# Patient Record
Sex: Female | Born: 1935 | Race: White | Hispanic: No | Marital: Married | Smoking: Never smoker
Health system: Southern US, Community
[De-identification: ages and names within clinical notes are randomized; demographics above are authoritative.]

## PROBLEM LIST (undated history)

## (undated) DIAGNOSIS — M810 Age-related osteoporosis without current pathological fracture: Secondary | ICD-10-CM

## (undated) DIAGNOSIS — I779 Disorder of arteries and arterioles, unspecified: Secondary | ICD-10-CM

## (undated) DIAGNOSIS — H269 Unspecified cataract: Secondary | ICD-10-CM

## (undated) DIAGNOSIS — R000 Tachycardia, unspecified: Secondary | ICD-10-CM

## (undated) DIAGNOSIS — I10 Essential (primary) hypertension: Secondary | ICD-10-CM

## (undated) DIAGNOSIS — I341 Nonrheumatic mitral (valve) prolapse: Secondary | ICD-10-CM

## (undated) DIAGNOSIS — T7840XA Allergy, unspecified, initial encounter: Secondary | ICD-10-CM

## (undated) DIAGNOSIS — M1990 Unspecified osteoarthritis, unspecified site: Secondary | ICD-10-CM

## (undated) DIAGNOSIS — I351 Nonrheumatic aortic (valve) insufficiency: Secondary | ICD-10-CM

## (undated) DIAGNOSIS — E785 Hyperlipidemia, unspecified: Secondary | ICD-10-CM

## (undated) DIAGNOSIS — I350 Nonrheumatic aortic (valve) stenosis: Secondary | ICD-10-CM

## (undated) DIAGNOSIS — R609 Edema, unspecified: Secondary | ICD-10-CM

## (undated) DIAGNOSIS — R7303 Prediabetes: Secondary | ICD-10-CM

## (undated) DIAGNOSIS — L0390 Cellulitis, unspecified: Secondary | ICD-10-CM

## (undated) DIAGNOSIS — I5189 Other ill-defined heart diseases: Secondary | ICD-10-CM

## (undated) DIAGNOSIS — E041 Nontoxic single thyroid nodule: Secondary | ICD-10-CM

## (undated) DIAGNOSIS — I739 Peripheral vascular disease, unspecified: Secondary | ICD-10-CM

## (undated) DIAGNOSIS — K219 Gastro-esophageal reflux disease without esophagitis: Secondary | ICD-10-CM

## (undated) HISTORY — PX: CATARACT EXTRACTION, BILATERAL: SHX1313

## (undated) HISTORY — PX: BIOPSY THYROID: PRO38

## (undated) HISTORY — DX: Nonrheumatic aortic (valve) insufficiency: I35.1

## (undated) HISTORY — PX: COLONOSCOPY: SHX174

## (undated) HISTORY — DX: Age-related osteoporosis without current pathological fracture: M81.0

## (undated) HISTORY — DX: Cellulitis, unspecified: L03.90

## (undated) HISTORY — DX: Disorder of arteries and arterioles, unspecified: I77.9

## (undated) HISTORY — DX: Unspecified cataract: H26.9

## (undated) HISTORY — DX: Peripheral vascular disease, unspecified: I73.9

## (undated) HISTORY — DX: Essential (primary) hypertension: I10

## (undated) HISTORY — DX: Hyperlipidemia, unspecified: E78.5

## (undated) HISTORY — PX: NASAL SINUS SURGERY: SHX719

## (undated) HISTORY — DX: Edema, unspecified: R60.9

## (undated) HISTORY — DX: Other ill-defined heart diseases: I51.89

## (undated) HISTORY — DX: Allergy, unspecified, initial encounter: T78.40XA

## (undated) HISTORY — DX: Gastro-esophageal reflux disease without esophagitis: K21.9

## (undated) HISTORY — DX: Nontoxic single thyroid nodule: E04.1

## (undated) HISTORY — DX: Prediabetes: R73.03

## (undated) HISTORY — DX: Tachycardia, unspecified: R00.0

## (undated) HISTORY — DX: Unspecified osteoarthritis, unspecified site: M19.90

## (undated) HISTORY — PX: TONSILLECTOMY: SUR1361

## (undated) HISTORY — DX: Nonrheumatic mitral (valve) prolapse: I34.1

---
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
## 1962-08-14 HISTORY — PX: ABDOMINAL HYSTERECTOMY: SHX81

## 2002-04-14 HISTORY — PX: CARDIAC CATHETERIZATION: SHX172

## 2002-05-01 ENCOUNTER — Inpatient Hospital Stay (HOSPITAL_COMMUNITY): Admission: EM | Admit: 2002-05-01 | Discharge: 2002-05-02 | Payer: Self-pay | Admitting: Emergency Medicine

## 2002-05-01 ENCOUNTER — Encounter: Payer: Self-pay | Admitting: Cardiology

## 2005-03-06 ENCOUNTER — Encounter: Admission: RE | Admit: 2005-03-06 | Discharge: 2005-06-04 | Payer: Self-pay | Admitting: Anesthesiology

## 2008-07-01 ENCOUNTER — Ambulatory Visit: Payer: Self-pay | Admitting: Cardiology

## 2008-07-16 ENCOUNTER — Ambulatory Visit: Payer: Self-pay

## 2008-07-16 ENCOUNTER — Encounter: Payer: Self-pay | Admitting: Cardiology

## 2009-03-22 ENCOUNTER — Encounter: Admission: RE | Admit: 2009-03-22 | Discharge: 2009-03-22 | Payer: Self-pay | Admitting: Otolaryngology

## 2009-03-22 IMAGING — CR DG CHEST 2V
2 series · 2 of 2 positions shown · non-contrast
Comparison: None.

CLINICAL DATA: Cough and bronchitis.

CHEST - 2 VIEW

[view not recorded (1 of 2)]
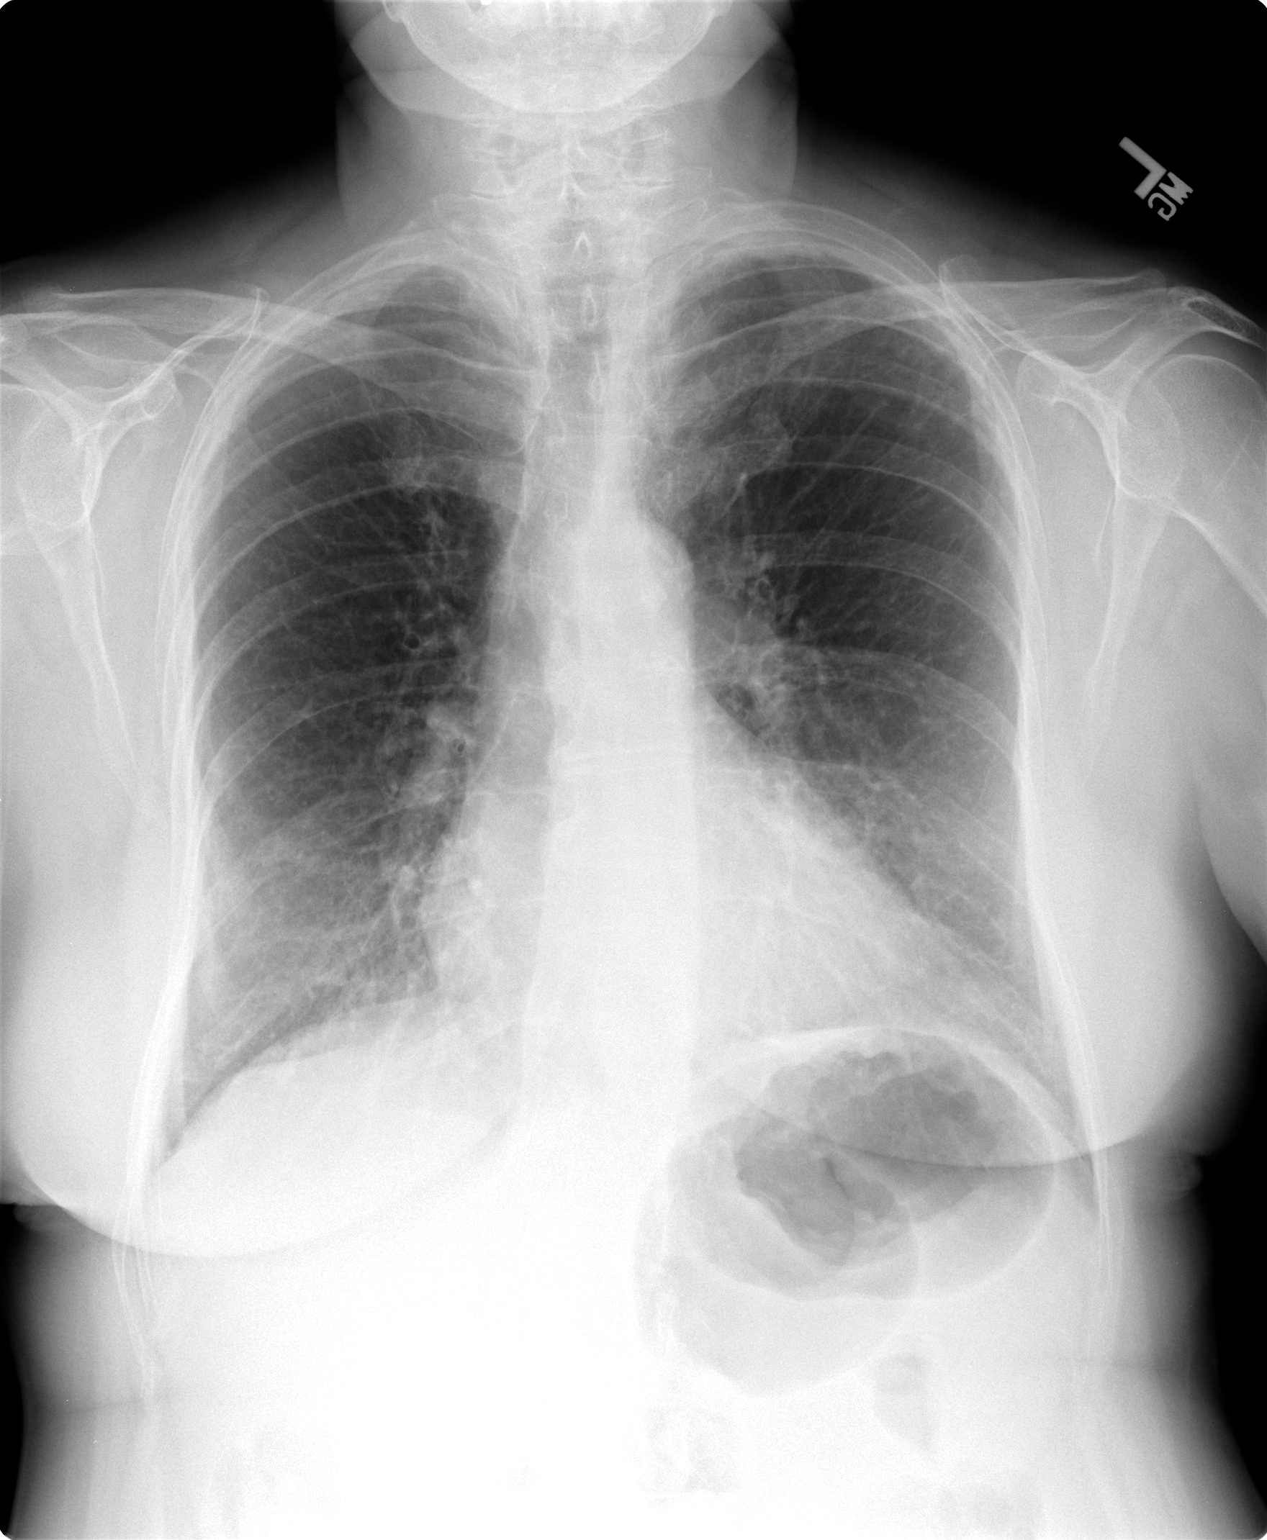

[view not recorded (2 of 2)]
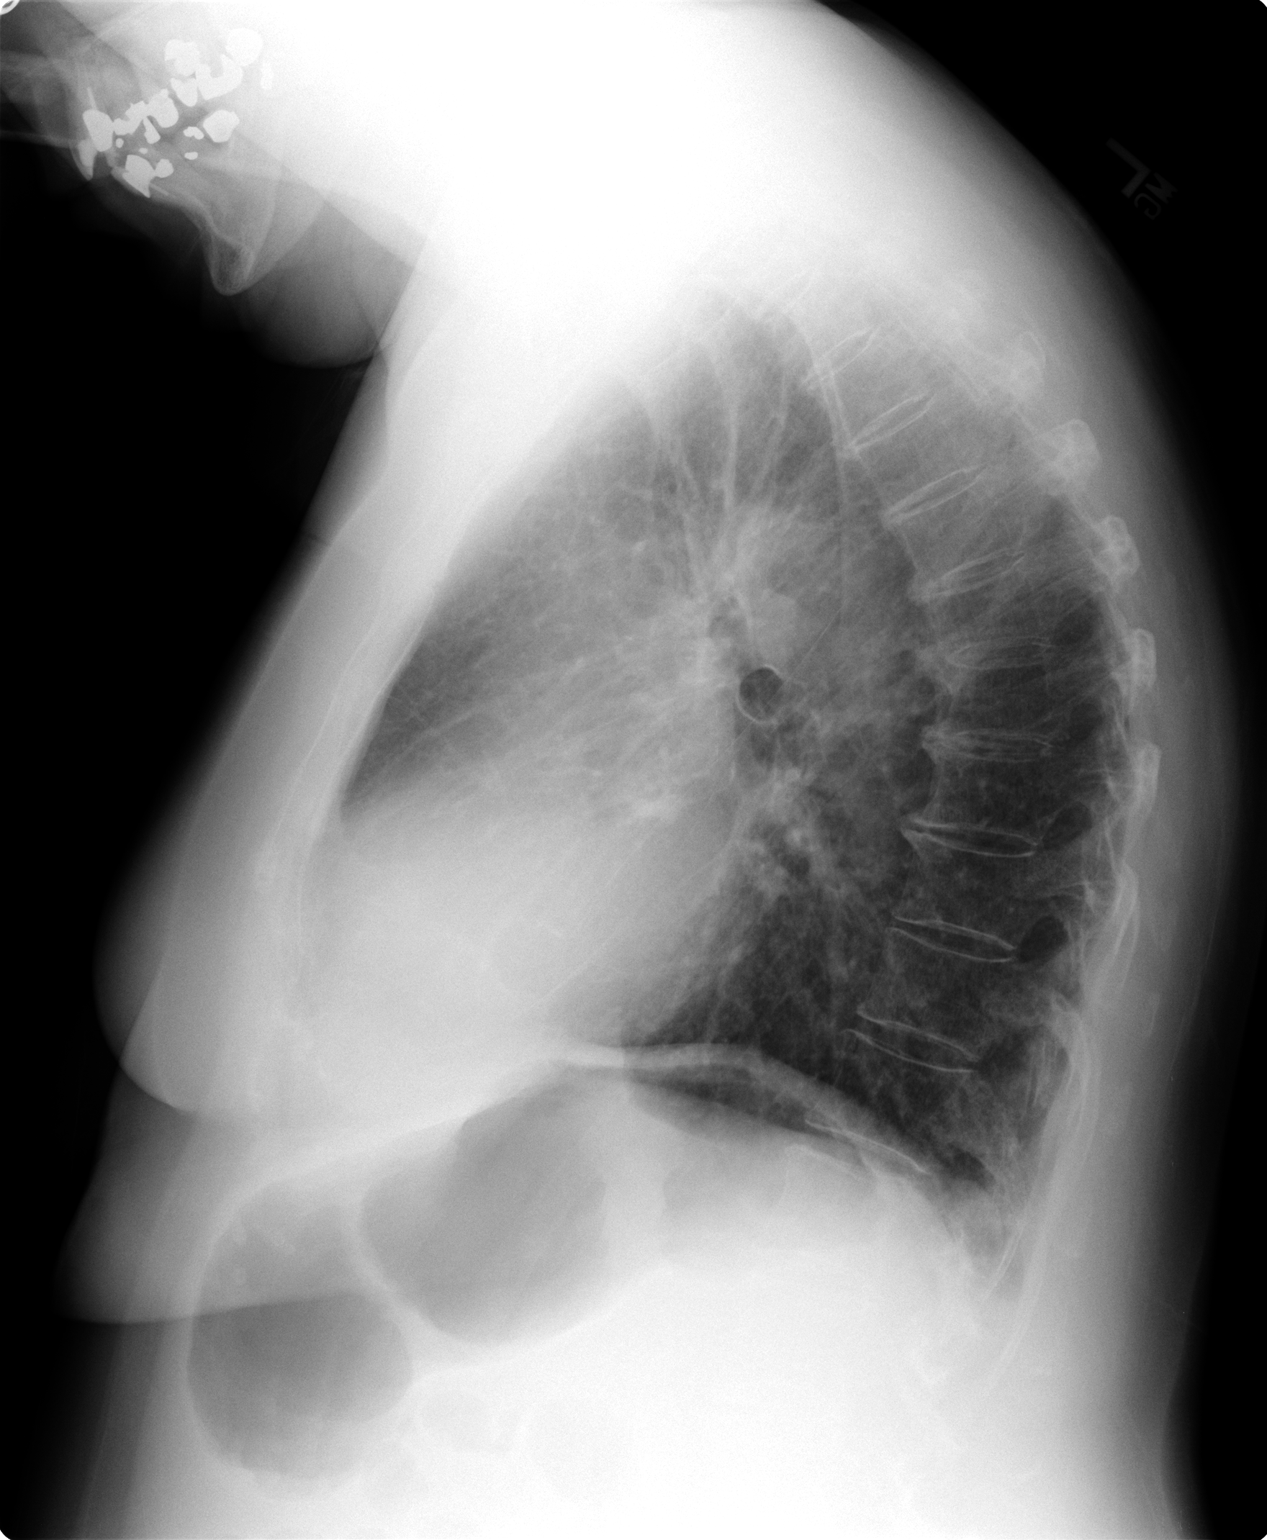

[2 of 2 positions shown; findings below may reference images not displayed]

FINDINGS: The lungs show mild hyperinflation.  Increased lung
markings diffusely are compatible with chronic lung disease.  There
is apical pleural and parenchymal scarring in the left upper lobe.
There is bibasilar scarring.  No acute infiltrate or effusion.
There is no mass lesion.
IMPRESSION: Chronic lung disease with scarring.  No acute cardiopulmonary
disease.

## 2009-04-22 ENCOUNTER — Encounter: Payer: Self-pay | Admitting: Cardiology

## 2009-06-22 ENCOUNTER — Encounter: Payer: Self-pay | Admitting: Cardiology

## 2009-06-24 ENCOUNTER — Ambulatory Visit: Payer: Self-pay | Admitting: Cardiology

## 2009-06-28 ENCOUNTER — Telehealth (INDEPENDENT_AMBULATORY_CARE_PROVIDER_SITE_OTHER): Payer: Self-pay | Admitting: *Deleted

## 2009-06-29 ENCOUNTER — Encounter (HOSPITAL_COMMUNITY): Admission: RE | Admit: 2009-06-29 | Discharge: 2009-06-29 | Payer: Self-pay | Admitting: Cardiology

## 2009-06-29 ENCOUNTER — Ambulatory Visit: Payer: Self-pay

## 2009-06-29 ENCOUNTER — Ambulatory Visit: Payer: Self-pay | Admitting: Cardiology

## 2009-07-13 ENCOUNTER — Ambulatory Visit: Payer: Self-pay | Admitting: Cardiology

## 2009-08-03 ENCOUNTER — Encounter: Payer: Self-pay | Admitting: Cardiology

## 2009-08-17 ENCOUNTER — Telehealth (INDEPENDENT_AMBULATORY_CARE_PROVIDER_SITE_OTHER): Payer: Self-pay | Admitting: *Deleted

## 2010-06-20 ENCOUNTER — Ambulatory Visit: Payer: Self-pay | Admitting: Cardiology

## 2010-07-15 ENCOUNTER — Encounter: Payer: Self-pay | Admitting: Cardiology

## 2010-07-18 ENCOUNTER — Ambulatory Visit: Payer: Self-pay | Admitting: Cardiology

## 2010-09-13 NOTE — Progress Notes (Signed)
  Phone Note Other Incoming   Caller: Patsy Action Taken: Information Sent Initial call taken by: KIm m    Faxed LOV,12 lead over to [place] to fax [phone] [facility]  August 17, 2009 4:33 PM

## 2010-09-13 NOTE — Assessment & Plan Note (Signed)
Summary: Melissa Carpenter   Visit Type:  Follow-up Primary Provider:  Vernon Prey, MD  CC:  shortness of breath.  History of Present Illness: Patient is seen for followup of shortness of breath.  This has been stable over the past year.  She's not been having any chest pain.  There's been no syncope or presyncope. She's going about full activity.  Current Medications (verified): 1)  Metoprolol Succinate 100 Mg Xr24h-Tab (Metoprolol Succinate) .... Take One Tablet By Mouth Daily 2)  Benicar 40 Mg Tabs (Olmesartan Medoxomil) .... Take One Tablet By Mouth Daily 3)  Caduet 10-20 Mg Tabs (Amlodipine-Atorvastatin) .... Take One Tablet By Mouth Once Daily. 4)  Actonel 35 Mg Tabs (Risedronate Sodium) .... Weekly 5)  Aciphex 20 Mg Tbec (Rabeprazole Sodium) .... Take One Tablet By Mouth Once Daily. 6)  Caltrate 600+d 600-400 Mg-Unit Tabs (Calcium Carbonate-Vitamin D) .... 2 Tabs Daily 7)  Multivitamins   Tabs (Multiple Vitamin) .... Take One Tablet By Mouth Once Daily. 8)  Aspirin 81 Mg Tbec (Aspirin) .... Take One Tablet By Mouth Daily  Allergies (verified): 1)  ! Sulfa 2)  ! Pcn 3)  ! Morphine 4)  ! Codeine  Past History:  Past Medical History: allergy... penicillin and sulfa CAROTID ARTERY DISEASE (ICD-433.10)...minimal 2003 DYSLIPIDEMIA (ICD-272.4) MITRAL VALVE PROLAPSE (ICD-424.0)...echo.. December, 2009... very mild intermittent prolapse of the posterior leaflet... no MR EF 60-70% echo.. December, 2009 Aortic insufficiency mild.... echo... December, 2009 HYPERTENSION (ICD-401.9) Appendectomy, tonsillectomy, hysterectomy, DJD... disease of the thoracic spine... osteoporosis Cardiac catheterization... September, 2003.. no significant CAD.Marland Kitchen  Review of Systems       Patient denies fever, chills, headache, sweats, rash, change in vision, change in hearing, chest pain, cough, nausea vomiting, urinary symptoms.  All other systems are reviewed and are negative.  Vital Signs:  Patient profile:    77 year old female Weight:      143 pounds BMI:     26.25 Pulse rate:   63 / minute BP sitting:   142 / 72  (left arm)  Vitals Entered By: Hardin Negus, RMA (June 20, 2010 9:55 AM)  Physical Exam  General:  patient is quite stable. Eyes:  no xanthelasma. Neck:  no jugular venous distention. Lungs:  lungs are clear.  Respiratory effort is nonlabored. Heart:  cardiac exam reveals an S1-S2.  No clicks  There is a soft systolic murmur. Abdomen:  abdomen is soft. Extremities:  no peripheral edema. Psych:  patient is oriented to person time and place.  Affect is normal.   Impression & Recommendations:  Problem # 1:  DYSPNEA (ICD-786.05)  Her updated medication list for this problem includes:    Metoprolol Succinate 100 Mg Xr24h-tab (Metoprolol succinate) .Marland Kitchen... Take one tablet by mouth daily    Benicar 40 Mg Tabs (Olmesartan medoxomil) .Marland Kitchen... Take one tablet by mouth daily    Aspirin 81 Mg Tbec (Aspirin) .Marland Kitchen... Take one tablet by mouth daily Or shortness of breath is a chronic problem.  It is stable.  There is no change.  We feel it is not cardiac in origin. EKG is done today and reviewed by me.  The EKG is normal.  Problem # 2:  CAROTID ARTERY DISEASE (ICD-433.10)  Her updated medication list for this problem includes:    Aspirin 81 Mg Tbec (Aspirin) .Marland Kitchen... Take one tablet by mouth daily The patient had minor carotid disease in the past.  She does not need a followup Doppler at this time.  Problem # 3:  MITRAL  VALVE PROLAPSE (ICD-424.0)  Her updated medication list for this problem includes:    Metoprolol Succinate 100 Mg Xr24h-tab (Metoprolol succinate) .Marland Kitchen... Take one tablet by mouth daily    Benicar 40 Mg Tabs (Olmesartan medoxomil) .Marland Kitchen... Take one tablet by mouth daily The patient had very mild mitral valve prolapse in the past.  She does not need a follow up echo at this time.  Problem # 4:  HYPERTENSION (ICD-401.9)  Her updated medication list for this problem  includes:    Metoprolol Succinate 100 Mg Xr24h-tab (Metoprolol succinate) .Marland Kitchen... Take one tablet by mouth daily    Benicar 40 Mg Tabs (Olmesartan medoxomil) .Marland Kitchen... Take one tablet by mouth daily    Caduet 10-20 Mg Tabs (Amlodipine-atorvastatin) .Marland Kitchen... Take one tablet by mouth once daily.    Aspirin 81 Mg Tbec (Aspirin) .Marland Kitchen... Take one tablet by mouth daily  Orders: EKG w/ Interpretation (93000) Blood pressure control today.  No change in therapy.  Patient Instructions: 1)  Your physician wants you to follow-up in:  1 year.  You will receive a reminder letter in the mail two months in advance. If you don't receive a letter, please call our office to schedule the follow-up appointment.

## 2010-09-13 NOTE — Letter (Signed)
Summary: [facility]   Imported By: Kassie Mends 10/07/2009 10:49:29  _____________________________________________________________________  External Attachment:    Type:   Image     Comment:   External Document

## 2010-09-13 NOTE — Miscellaneous (Signed)
Summary: Orders Update  Clinical Lists Changes  Orders: Added new Test order of Carotid Duplex (Carotid Duplex) - Signed 

## 2010-12-27 NOTE — Assessment & Plan Note (Signed)
[facility]                            CARDIOLOGY OFFICE NOTE   Melissa Carpenter, Melissa Carpenter                           MRN:          [id number]  DATE:07/01/2008                            DOB:          1934/04/12    Melissa Carpenter is here for cardiology evaluation and to establish as a new  cardiology patient.  She has been followed over time by Dr. Helmut Muster  in [place], [place].  She has had some problems with blood pressure over  time, but Melissa pressure is controlled at this time.  Over the past few  weeks, she had a few episodes of excess nighttime urination and feeling  unusual at that time.  However, this is resolved and she is feeling  fine.  She does have arthritis, but she does not allow this to limit Melissa  activities.   There is a history of mitral valve prolapse.  I have outside information  that I have reviewed.  There is a Cardiolite scan from 2006 revealing no  ischemia.  I do not have any prior echo data.   I have now received other old information from our office.  The patient  actually underwent a cardiac catheterization in 2003.  There was no  critical disease.  She had a carotid Doppler in the past with only  slight abnormality in Melissa right carotid.   PAST MEDICAL HISTORY:   ALLERGIES:  PENICILLIN and SULFA.   MEDICATIONS:  1. Toprol-XL 100.  2. Benicar 40.  3. Caduet 5/10.  4. Actonel 35 once weekly.  5. Caltrate.  6. Multivitamin.  7. Tums.  8. Aspirin.   OTHER MEDICAL PROBLEMS:  See the list below.   SOCIAL HISTORY:  The patient is married and I take care of Melissa Carpenter.  She does not smoke.   FAMILY HISTORY:  Melissa father died and did have a heart attack at age 69.  She does have a brother who had a heart attack at age 37.   REVIEW OF SYSTEMS:  She is doing well at this time.  She is not having  any significant headaches or eye problems now.  She has no shortness of  breath.  She is not having any nausea or vomiting.  She is not  having  any GU symptoms.  Otherwise, Melissa review of systems is negative.   PHYSICAL EXAMINATION:  VITAL SIGNS:  Blood pressure is 126/74 with a  pulse of 61.  GENERAL:  The patient is oriented to person, time, and place.  Affect is  normal.  HEENT:  No xanthelasma.  She has normal extraocular motion.  NECK:  There are no carotid bruits.  There is no jugular venous  distention.  LUNGS:  Clear.  Respiratory effort is not labored.  CARDIAC:  S1 with an S2.  There are no clicks or significant murmurs.  ABDOMEN:  Soft.  She has no significant peripheral edema.   EKG is normal.   PROBLEMS:  1. History of allergy to PENICILLIN and SULFA.  2. Appendectomy, tonsillectomy, hysterectomy, and sinus surgery.  3. History  of hypertension, treated.  4. History of mitral valve prolapse.  She does need a 2D echo to      reassess Melissa mitral valve status and Melissa overall left ventricular      unction.  5. Degenerative joint disease.  6. Lipid abnormalities being treated.  7. Disease of Melissa thoracic spine, that is followed.  8. Osteoporosis.  9. Cardiac catheterization in 2003 with no significant coronary      disease.  10.Mild very minimal disease of a right carotid in 2003.  Follow up      carotid Doppler will be appropriate.   I will arrange for the studies as outlined above and I will see Melissa back  in 6 months.     Luis Abed, MD, [facility]  Electronically Signed    JDK/MedQ  DD: 07/01/2008  DT: 07/01/2008  Job #: [id number]   cc:   Dr. Helmut Muster

## 2010-12-30 NOTE — H&P (Signed)
NAME:  Melissa Carpenter, Melissa Carpenter                            ACCOUNT NO.:  [id number]   MEDICAL RECORD NO.:  [id number]                   PATIENT TYPE:  INP   LOCATION:  1830                                 FACILITY:  [facility]   PHYSICIAN:  Noralyn Pick. Eden Emms, M.D. [facility]           DATE OF BIRTH:  Sep 28, 1933   DATE OF ADMISSION:  DATE OF DISCHARGE:                                HISTORY & PHYSICAL   CHIEF COMPLAINT:  Chest pain.   PRIMARY CARE PHYSICIAN:  Helmut Muster, M.D. in [place], [place].   HISTORY OF PRESENT ILLNESS:  The patient had sudden onset of left-sided  chest and arm pain although the pain was sharp, it continued with a somewhat  of a dull ache.  She had no associated shortness of breath or diuresis.  It  was worse with exertion.  At the time in the office her blood pressure was  elevated at 100/90 and her EKG had nonspecific ST-T wave changes and Dr.  Leone Payor office.  She felt improvement with oxygen and she was not given a  nitroglycerin and arrived in the emergency room at [facility]  pain  free.  She has had previous occurrences of this as well as some exertional  fatigue and has been somewhat worse in the last six weeks.  Her coronary  risk factors are multiple and include severe hypertension on multi-drug  therapy, hyperlipidemia, positive family history for coronary artery  disease.  She saw her cardiologist about 10 years ago and from what she  says, had an unremarkable catheterization about 10 years ago.   PAST SURGICAL HISTORY:  Tonsillectomy and adenoidectomy, appendectomy,  partial hysterectomy, and some sinus surgery.   ALLERGIES:  Penicillin and sulfa and codeine.   MEDICATIONS:  1. Accupril 40 mg a day.  2. Toprol 100 mg a day.  3. Lipitor 10 mg a day.  4. Actonel 35 mg a day.  5. Caltrate two tablets a day.  6. Tums.  7. An aspirin a day.  8. Niaspan 1 mg q.h.s.  9. Norvasc 5 mg a day.   REVIEW OF SYSTEMS:  Remarkable for some GERD  symptoms.   SOCIAL HISTORY:  She lives in [place] with her husband.  She is somewhat  sedentary but does walk on occasion.  She is a retired town Solicitor.  She  primarily does yard and housework.   FAMILY HISTORY:  Positive on both sides for CVA and myocardial infarctions.  There is also a history of kidney failure on the mother's side.    PHYSICAL EXAMINATION:   VITAL SIGNS:  Blood pressure 185/93.  This came down in the emergency room  to 167/82 in the right and 144/75 in the left.   LUNGS:  Clear.   NECK:  There is a right carotid bruit.   HEART:  S1 and S2 with an S4 gallop.   ABDOMEN:  Benign.  No renal bruits.   EXTREMITIES:  Distal pulses intact with no edema.   LABORATORY DATA:  EKG shows sinus rhythm with nonspecific ST-T wave changes.  Labs and chest x-ray are pending at this time.   IMPRESSION:  The patient has somewhat worsening chest pain over the last six  weeks.  It is exertional but otherwise not typical for angina given her  multiple coronary risk factors and right carotid bruit.  I talked to the  patient about proceeding with heart catheterization.  I think the pretest  likelihood of coronary artery disease is fairly high.  We will look at her  chest x-ray as soon as it is done to make sure if her mediastinum and aorta  look good.  At the time of catheterization, we can assess the slightly  different blood pressures in each arm with aortography and also assess her  renal arteries since she is on four to five drugs for her blood pressure.  The risks including stroke, emergency surgery and dye allergy were discussed  with the patient and her husband and they are willing to proceed.  We will  have a low threshold to place her on IV nitroglycerin should her pain recur.  She will be put on subcutaneous Lovenox overnight.  Her blood pressure  medicines will be continued and she was given aspirin en route and will be  put on aspirin once a day.  Most of her medical  care is done in [place] and she will follow up  there with the [facility].  Further recommendations will be based on the results of her heart  catheterization.                                                  Noralyn Pick. Eden Emms, M.D. [facility]    PCN/MEDQ  D:  05/01/2002  T:  05/01/2002  Job:  [id number]

## 2010-12-30 NOTE — Discharge Summary (Signed)
NAME:  Melissa Carpenter, Melissa Carpenter                            ACCOUNT NO.:  [id number]   MEDICAL RECORD NO.:  [id number]                   PATIENT TYPE:  INP   LOCATION:  2031                                 FACILITY:  [facility]   PHYSICIAN:  Noralyn Pick. Eden Emms, M.D. [facility]           DATE OF BIRTH:  Aug 29, 1933   DATE OF ADMISSION:  05/01/2002  DATE OF DISCHARGE:  05/02/2002                           DISCHARGE SUMMARY - REFERRING   DISCHARGE DIAGNOSES:  1. Chest pain, resolved.  2. Hypertension, treated.  3. Hyperlipidemia,treated.  4. Right carotid bruit.  5. Osteoporosis.  6. Postmenopausal.   HOSPITAL COURSE:  The patient is a 77 year old female patient admitted to  [facility] on 05/01/2002 with substernal left-sided chest pain radiating  into her left arm.  Apparently she has had a cardiac catheterization about  10 years ago over at a cardiologist in [place], [place].  She  was initially seen by Montey Hora, P.A.-C. at [facility] and was transported to [facility] via EMS.   The patient underwent significant cardiac testing while at [facility]  which included CK-MB and troponin x 3 which were negative.  Hemoglobin 13.0,  hematocrit 37.7, white blood cells 5.8, platelets 235.  Potassium 3.6,  glucose 109, BUN 10, creatinine 0.5, sodium 142.  D-dimer 0.39.  Lipid  profile showed total cholesterol of 178, triglycerides 527.  LDL was not  calculated secondary to hypertriglyceridemia.  HDL was 38.   Chest x-ray revealed COPD with apical scarring.   The patient then underwent cardiac catheterization on 05/02/2002, and this  revealed calcification of the LAD with no evidence of coronary artery  disease per angiogram.  No MR was noted.  Ejection fraction was 65%.  Recommendations for this patient are for aggressive treatment of the  patient's blood pressure and continue aggressive treatment of cardiac risk  factors.   In addition during the patient's  hospitalization, she was noted to have a  right carotid bruit. We set her up for carotid ultrasound on 06/01/2002 at 9  a.m.  She will then follow up with Dr. Eden Emms that same day at 3 p.m.  At  that point, we can refer her back to her primary care doctor for further  medical treatment.   DISCHARGE MEDICATIONS:  1. Enteric-coated aspirin 325 mg 1 p.o. q.d.  2. Niaspan 1 mg q.h.s.  3. Toprol XL 100 mg 1 p.o. q.d.  4. Accupril 40 mg 1 p.o. q.d.  5. Caltrate 2 tablets daily.  6. Tums.  7. Lipitor 10 mg 1 p.o. q.d.  8. E-Vista 60 mg 1 p.o. q.d.  9. Actonel as before.  10.      Norvasc.  I have increased this to 2 mg 1 p.o. q.d.  11.      Tylenol as needed for pain.   ACTIVITY:  No strenuous activity for two days, then gradually increase  activity.  DIET:  Renal low fat.   WOUND CARE:  Clean catheterization site with soap and water.  Call for  questions or concerns.      Guy Franco, P.A. [facility]                      Peter C. Eden Emms, M.D. [facility]    LB/MEDQ  D:  05/02/2002  T:  05/05/2002  Job:  [id number]

## 2010-12-30 NOTE — Cardiovascular Report (Signed)
NAME:  Melissa Carpenter, Melissa Carpenter                            ACCOUNT NO.:  [id number]   MEDICAL RECORD NO.:  [id number]                   PATIENT TYPE:  INP   LOCATION:  2031                                 FACILITY:  [facility]   PHYSICIAN:  Learta Codding, M.D. [facility]             DATE OF BIRTH:  30-Dec-1933   DATE OF PROCEDURE:  05/02/2002  DATE OF DISCHARGE:                              CARDIAC CATHETERIZATION   PROCEDURES PERFORMED:  1. Left heart catheterization with selective coronary angiography.  2. Ventriculography.   DIAGNOSES:  1. No evidence of flow-limiting epicardial coronary artery disease.  2. No evidence of aortic dissection.  3. Patent renal arteries bilaterally.   INDICATION:  The patient is a 77 year old female with a long-standing  history of severe hypertension. The patient was admitted yesterday when she  had sudden onset of left-sided chest pain which felt sharp and stabbing and  occurred around 12 o'clock. She presented to [facility] and electrocardiogram was obtained without obvious evidence of  ischemia. However, the patient was markedly hypertensive and she was  referred for admission to [facility].  Given her multiple risk  factors and chest pain suspicious of angina, the patient was referred for  cardiac catheterization by Dr. Eden Emms to establish her coronary anatomy. Of  note is that the patient had a prior cardiac catheterization approximately  10 years ago which was within normal limits.   DESCRIPTION OF PROCEDURE:  After informed consent was obtained, the patient  was brought to the catheterization laboratory.  The right groin was  sterilely prepped and draped.  Then 1% lidocaine was infiltrated.  A 6  French arterial sheath was placed using the modified Seldinger technique.  The 6 [place] and JR4 catheters were used to engage the left and right  coronary ostia, respectively.  A 6 French angled pigtail catheter was used  to  perform ventriculography as well as aortography and distal runoff. At the  termination of the case, the catheters and sheaths were removed.  The  patient was brought back to the holding area and no complications were  encountered.   FINDINGS:  HEMODYNAMICS:  Left ventricular pressure 145/9 mmHg.  Aortic  pressure 145/71 mmHg.  There was no gradient on aortic pullback.   VENTRICULOGRAPHY:  Ejection fraction is 65% with no evidence of mitral  regurgitation, no segmental wall motion abnormalities.   SELECTIVE CORONARY ANGIOGRAPHY:  1. Left main coronary artery was a large caliber vessel with no evidence of     flow-limiting disease.  2. Left anterior descending artery is a large caliber vessel giving rise to     several diagonal branches, all without evidence of flow-limiting disease.  3. The circumflex coronary artery was a large and dominant vessel.  All     obtuse marginal branches were free of flow-limiting disease as well as     the circumflex  proper.  4. Right coronary artery was a diminutive vessel with no evidence of flow-     limiting disease.   AORTOGRAM:  Aortogram shows bilateral patent renal arteries as well as no  evidence of a abdominal aortic aneurysm.   AORTOGRAPHY:  The aorta was assessed at the time of ventriculography.  This  was obtained in the RAO projection.  There was no evidence of aortic  dilatation nor was there any evidence of dissection.   RECOMMENDATIONS:  No evidence of cardiac cause of chest pain. There is also  no evidence of dissection. There is no evidence of renal artery stenosis  contributing to her significant blood pressure problems.  At this point in  time, I would refer the patient to follow up with her primary care physician  to rule out pheochromocytoma and hyperaldosteronism and obtain further blood  and urine testing as indicated. It appears the patient's chest pain was  noncardiac in origin.  She also appears to have no evidence of  pulmonary  embolism with a negative D-dimer.  The patient does have a carotid bruit and  this will be followed up in the office with carotid Dopplers.  She will also  see Dr. Eden Emms in followup to discuss these results.                                                     Learta Codding, M.D. [facility]    GED/MEDQ  D:  05/02/2002  T:  05/05/2002  Job:  [id number]   cc:   Helmut Muster, M.D.  [place], [place]   Montey Hora, M.D.  [facility] C. Eden Emms, M.D. [facility]

## 2011-04-14 ENCOUNTER — Encounter: Payer: Self-pay | Admitting: Cardiology

## 2011-05-02 ENCOUNTER — Encounter: Payer: Self-pay | Admitting: Cardiology

## 2011-06-15 ENCOUNTER — Ambulatory Visit: Payer: Self-pay | Admitting: Cardiology

## 2011-06-25 ENCOUNTER — Encounter: Payer: Self-pay | Admitting: Cardiology

## 2011-06-25 DIAGNOSIS — I10 Essential (primary) hypertension: Secondary | ICD-10-CM | POA: Insufficient documentation

## 2011-06-25 DIAGNOSIS — R079 Chest pain, unspecified: Secondary | ICD-10-CM | POA: Insufficient documentation

## 2011-06-25 DIAGNOSIS — I779 Disorder of arteries and arterioles, unspecified: Secondary | ICD-10-CM | POA: Insufficient documentation

## 2011-06-25 DIAGNOSIS — I341 Nonrheumatic mitral (valve) prolapse: Secondary | ICD-10-CM | POA: Insufficient documentation

## 2011-06-25 DIAGNOSIS — I739 Peripheral vascular disease, unspecified: Secondary | ICD-10-CM

## 2011-06-25 DIAGNOSIS — R9430 Abnormal result of cardiovascular function study, unspecified: Secondary | ICD-10-CM | POA: Insufficient documentation

## 2011-06-25 DIAGNOSIS — I351 Nonrheumatic aortic (valve) insufficiency: Secondary | ICD-10-CM | POA: Insufficient documentation

## 2011-06-25 DIAGNOSIS — E785 Hyperlipidemia, unspecified: Secondary | ICD-10-CM | POA: Insufficient documentation

## 2011-06-25 DIAGNOSIS — R0602 Shortness of breath: Secondary | ICD-10-CM | POA: Insufficient documentation

## 2011-06-28 ENCOUNTER — Ambulatory Visit (INDEPENDENT_AMBULATORY_CARE_PROVIDER_SITE_OTHER): Payer: Medicare Other | Admitting: Cardiology

## 2011-06-28 ENCOUNTER — Encounter: Payer: Self-pay | Admitting: Cardiology

## 2011-06-28 DIAGNOSIS — R609 Edema, unspecified: Secondary | ICD-10-CM

## 2011-06-28 DIAGNOSIS — R079 Chest pain, unspecified: Secondary | ICD-10-CM

## 2011-06-28 DIAGNOSIS — I351 Nonrheumatic aortic (valve) insufficiency: Secondary | ICD-10-CM

## 2011-06-28 DIAGNOSIS — I10 Essential (primary) hypertension: Secondary | ICD-10-CM

## 2011-06-28 DIAGNOSIS — I359 Nonrheumatic aortic valve disorder, unspecified: Secondary | ICD-10-CM

## 2011-06-28 DIAGNOSIS — I341 Nonrheumatic mitral (valve) prolapse: Secondary | ICD-10-CM

## 2011-06-28 DIAGNOSIS — R0602 Shortness of breath: Secondary | ICD-10-CM

## 2011-06-28 DIAGNOSIS — I779 Disorder of arteries and arterioles, unspecified: Secondary | ICD-10-CM

## 2011-06-28 MED ORDER — HYDROCHLOROTHIAZIDE 12.5 MG PO CAPS: 12.5000 mg | ORAL_CAPSULE | Freq: Every day | ORAL | Status: DC

## 2011-06-28 NOTE — Patient Instructions (Addendum)
Your physician recommends that you schedule a follow-up appointment in: 4 weeks with Dr Myrtis Ser Your physician has recommended you make the following change in your medication: START HCTZ 12.5 mg daily Your physician has requested that you have an echocardiogram. Echocardiography is a painless test that uses sound waves to create images of your heart. It provides your doctor with information about the size and shape of your heart and how well your heart's chambers and valves are working. This procedure takes approximately one hour. There are no restrictions for this procedure. TO BE DONE ON THE SAME DAY AS FOLLOW UP APPOINTMENT

## 2011-06-28 NOTE — Progress Notes (Signed)
HPI   Patient is seen for one year cardiology followup.  I saw her last November, 2011. She has mild carotid artery disease.  He had mild mitral valve prolapse in the past with good LV function.  There is also mild aortic insufficiency.  Catheterization had been done in 2003 showing no significant coronary artery disease.  Past few months she has noted some peripheral edema.  This decreases at nighttime but has not gone in the morning.  She is not having PND or orthopnea.  She has chronic shortness of breath.  It may be slightly worse. Allergies  Allergen Reactions  . Codeine   . Morphine   . Penicillins   . Sulfonamide Derivatives     Current Outpatient Prescriptions  Medication Sig Dispense Refill  . amlodipine-atorvastatin (CADUET) 10-20 MG per tablet Take 1 tablet by mouth daily.        Marland Kitchen aspirin 81 MG tablet Take 81 mg by mouth daily.        . Calcium Carbonate-Vitamin D (CALTRATE 600+D) 600-400 MG-UNIT per tablet Take 2 tablets by mouth daily.        Marland Kitchen EVISTA 60 MG tablet Take 40 mg by mouth daily.       . metoprolol (TOPROL-XL) 100 MG 24 hr tablet Take 100 mg by mouth daily.        . Multiple Vitamin (MULTIVITAMIN) tablet Take 1 tablet by mouth daily.        Marland Kitchen olmesartan (BENICAR) 40 MG tablet Take 40 mg by mouth daily.        . RABEprazole (ACIPHEX) 20 MG tablet Take 20 mg by mouth daily.        . risedronate (ACTONEL) 35 MG tablet Take 35 mg by mouth every 7 (seven) days. with water on empty stomach, nothing by mouth or lie down for next 30 minutes.         History   Social History  . Marital Status: Married    Spouse Name: N/A    Number of Children: N/A  . Years of Education: N/A   Occupational History  . Not on file.   Social History Main Topics  . Smoking status: Never Smoker   . Smokeless tobacco: Not on file  . Alcohol Use: Not on file  . Drug Use: Not on file  . Sexually Active: Not on file   Other Topics Concern  . Not on file   Social History Narrative    . No narrative on file    No family history on file.  Past Medical History  Diagnosis Date  . Shortness of breath   . Carotid artery disease     , Doppler, 2003  . Dyslipidemia   . Mitral valve prolapse     Echo, 2009, very mild intermittent prolapse of the posterior leaflet, no MR  . Ejection fraction     EF 60-70%, echo, December, 2009  . Aortic insufficiency     Mild, echo, December, 2009  . Hypertension   . DJD (degenerative joint disease)   . Chest pain     Catheterization, 2003, no significant CAD    Past Surgical History  Procedure Date  . Cardiac catheterization sept 2003    no significant cad  . Appendectomy   . Tonsillectomy   . Hysterectomy--unknown type     ROS     Patient denies fever, chills, headache, sweats, rash, change in vision, change in hearing, chest pain, cough, nausea vomiting, urinary symptoms.  All  of the systems are reviewed and are negative.  PHYSICAL EXAM Patient is stable.  She is here with her husband today.  She is oriented to person time and place.  Affect is normal.  There is no jugular venous distention.  Lungs are clear.  Respiratory effort is not labored.  Cardiac exam reveals S1-S2.  There is a soft systolic murmur.  The abdomen is soft.  There is trace peripheral edema.  There are no musculoskeletal deformities.  No skin rashes. Filed Vitals:   06/28/11 1007  BP: 110/62  Pulse: 71  Resp: 18  Height: 5\' 1"  (1.549 m)  Weight: 146 lb (66.225 kg)    EKG is done today and reviewed by me.  There is normal sinus rhythm.  EKG is normal.  ASSESSMENT & PLAN

## 2011-06-28 NOTE — Assessment & Plan Note (Signed)
She has not had any recurrent chest pain. No further workup. 

## 2011-06-28 NOTE — Assessment & Plan Note (Signed)
Mitral valve will be reassessed with her followup echo.

## 2011-06-28 NOTE — Assessment & Plan Note (Signed)
This is a new problem.  She's had some mild edema for approximately several weeks.  I will start low-dose diuretic.  Also followup two-dimensional echo will be done to reassess LV function and her valvular status.  I will then see her back for followup.

## 2011-06-28 NOTE — Assessment & Plan Note (Signed)
Patient has some chronic shortness of breath.  It is primarily with exertion.  It may be slightly worse recently.  There may be a mild volume component.

## 2011-06-28 NOTE — Assessment & Plan Note (Signed)
Patient did have carotid Dopplers in December, 2011.  She has very mild internal carotid disease.  She does have external carotid artery disease.  Plan to followup in 2 years.

## 2011-06-28 NOTE — Assessment & Plan Note (Signed)
Aortic insufficiency will be reassessed with her followup echo.

## 2011-06-28 NOTE — Assessment & Plan Note (Signed)
Blood pressure is controlled. No change in therapy. 

## 2011-08-03 ENCOUNTER — Ambulatory Visit (HOSPITAL_COMMUNITY): Payer: Medicare Other | Attending: Cardiology | Admitting: Radiology

## 2011-08-03 ENCOUNTER — Ambulatory Visit (INDEPENDENT_AMBULATORY_CARE_PROVIDER_SITE_OTHER): Payer: Medicare Other | Admitting: Cardiology

## 2011-08-03 ENCOUNTER — Encounter: Payer: Self-pay | Admitting: Cardiology

## 2011-08-03 DIAGNOSIS — R0602 Shortness of breath: Secondary | ICD-10-CM

## 2011-08-03 DIAGNOSIS — I341 Nonrheumatic mitral (valve) prolapse: Secondary | ICD-10-CM

## 2011-08-03 DIAGNOSIS — I359 Nonrheumatic aortic valve disorder, unspecified: Secondary | ICD-10-CM | POA: Insufficient documentation

## 2011-08-03 DIAGNOSIS — R0989 Other specified symptoms and signs involving the circulatory and respiratory systems: Secondary | ICD-10-CM | POA: Insufficient documentation

## 2011-08-03 DIAGNOSIS — R0609 Other forms of dyspnea: Secondary | ICD-10-CM | POA: Insufficient documentation

## 2011-08-03 DIAGNOSIS — I10 Essential (primary) hypertension: Secondary | ICD-10-CM | POA: Insufficient documentation

## 2011-08-03 DIAGNOSIS — I059 Rheumatic mitral valve disease, unspecified: Secondary | ICD-10-CM | POA: Insufficient documentation

## 2011-08-03 DIAGNOSIS — R9430 Abnormal result of cardiovascular function study, unspecified: Secondary | ICD-10-CM

## 2011-08-03 DIAGNOSIS — I079 Rheumatic tricuspid valve disease, unspecified: Secondary | ICD-10-CM | POA: Insufficient documentation

## 2011-08-03 DIAGNOSIS — E785 Hyperlipidemia, unspecified: Secondary | ICD-10-CM | POA: Insufficient documentation

## 2011-08-03 LAB — BASIC METABOLIC PANEL
BUN: 16 mg/dL (ref 6–23)
Chloride: 104 mEq/L (ref 96–112)
Potassium: 4 mEq/L (ref 3.5–5.1)

## 2011-08-03 NOTE — Assessment & Plan Note (Signed)
At this point she still has some mild shortness of breath. She made need a more potent diuretic. However I am hesitant to push her meds further as of today. She has lost 5 pounds since last week. For now I will continue hydrochlorothiazide. Chemistry is to be checked today to be sure her renal function and potassium were stable. I will then see her for followup. I told her that she should be reassured concerning the results of her echo. She should continue full activities.

## 2011-08-03 NOTE — Patient Instructions (Addendum)
Your physician recommends that you schedule a follow-up appointment in: 8 weeks.  Your physician recommends that you return for lab work in: today (bmet)

## 2011-08-03 NOTE — Assessment & Plan Note (Signed)
There is no significant mitral valve prolapse seen today. There is trivial mitral regurgitation.

## 2011-08-03 NOTE — Progress Notes (Signed)
HPI Patient is seen today for followup shortness of breath. I saw her last June 28, 2011. At that time there was some shortness of breath. I felt that there was a mild volume component. I felt that she had trace edema. I started her on a low dose of hydrochlorothiazide. She does not remember any excess urine output but her weight is down 5 pounds since that visit. She still has some exertional shortness of breath when climbing the stairs from her basement. Two-dimensional echo was done today and I have reviewed the images myself. She has normal systolic function. There is mild diastolic dysfunction and there may be mild increase in left ventricular filling pressure. There is mild mitral regurgitation. Allergies  Allergen Reactions  . Codeine   . Morphine   . Penicillins   . Sulfonamide Derivatives     Current Outpatient Prescriptions  Medication Sig Dispense Refill  . amlodipine-atorvastatin (CADUET) 10-20 MG per tablet Take 1 tablet by mouth daily.        Marland Kitchen aspirin 81 MG tablet Take 81 mg by mouth daily.        . Calcium Carbonate-Vitamin D (CALTRATE 600+D) 600-400 MG-UNIT per tablet Take 2 tablets by mouth daily.        . cholecalciferol (VITAMIN D) 400 UNITS TABS Take 1,000 Units by mouth.        . EVISTA 60 MG tablet Take 40 mg by mouth daily.       . fish oil-omega-3 fatty acids 1000 MG capsule Take 1 g by mouth daily.        . hydrochlorothiazide (MICROZIDE) 12.5 MG capsule Take 1 capsule (12.5 mg total) by mouth daily.  30 capsule  6  . metoprolol (TOPROL-XL) 100 MG 24 hr tablet Take 100 mg by mouth daily.        . Multiple Vitamin (MULTIVITAMIN) tablet Take 1 tablet by mouth daily.        Marland Kitchen olmesartan (BENICAR) 40 MG tablet Take 40 mg by mouth daily.        . RABEprazole (ACIPHEX) 20 MG tablet Take 20 mg by mouth daily.        . risedronate (ACTONEL) 35 MG tablet Take 35 mg by mouth every 7 (seven) days. with water on empty stomach, nothing by mouth or lie down for next 30 minutes.          History   Social History  . Marital Status: Married    Spouse Name: N/A    Number of Children: N/A  . Years of Education: N/A   Occupational History  . Not on file.   Social History Main Topics  . Smoking status: Never Smoker   . Smokeless tobacco: Never Used  . Alcohol Use: Not on file  . Drug Use: Not on file  . Sexually Active: Not on file   Other Topics Concern  . Not on file   Social History Narrative  . No narrative on file    No family history on file.  Past Medical History  Diagnosis Date  . Shortness of breath   . Carotid artery disease     , Doppler, 2003  . Dyslipidemia   . Mitral valve prolapse     Echo, 2009, very mild intermittent prolapse of the posterior leaflet, no MR  . Ejection fraction     EF 60-70%, echo, December, 2009  . Aortic insufficiency     Mild, echo, December, 2009  . Hypertension   . DJD (degenerative  joint disease)   . Chest pain     Catheterization, 2003, no significant CAD  . Edema     November, 2012    Past Surgical History  Procedure Date  . Cardiac catheterization sept 2003    no significant cad  . Appendectomy   . Tonsillectomy   . Hysterectomy--unknown type     ROS  Patient denies fever, chills, headache, sweats, rash, change in vision, change in hearing, chest pain, cough, nausea vomiting, urinary symptoms. All other systems are reviewed and are negative.  PHYSICAL EXAM Patient is stable today. She is oriented to person time and place. Affect is normal. Head is atraumatic. There is no jugulovenous distention. Lungs are clear. Respiratory effort is nonlabored. Cardiac exam reveals S1 and S2. There no clicks or significant murmurs. The abdomen is soft. There is no significant edema at this time.  Filed Vitals:   08/03/11 0951  BP: 142/70  Pulse: 76  Resp: 12  Height: 5\' 2"  (1.575 m)  Weight: 145 lb 6.4 oz (65.953 kg)     ASSESSMENT & PLAN

## 2011-08-03 NOTE — Assessment & Plan Note (Signed)
Her ejection fraction remains in the normal range. There is some diastolic dysfunction.

## 2011-08-03 NOTE — Progress Notes (Signed)
Addended by: Worthy Rancher D on: 08/03/2011 10:52 AM   Modules accepted: Orders

## 2011-08-18 DIAGNOSIS — E785 Hyperlipidemia, unspecified: Secondary | ICD-10-CM | POA: Diagnosis not present

## 2011-08-18 DIAGNOSIS — E559 Vitamin D deficiency, unspecified: Secondary | ICD-10-CM | POA: Diagnosis not present

## 2011-08-18 DIAGNOSIS — I10 Essential (primary) hypertension: Secondary | ICD-10-CM | POA: Diagnosis not present

## 2011-08-28 DIAGNOSIS — R350 Frequency of micturition: Secondary | ICD-10-CM | POA: Diagnosis not present

## 2011-09-06 DIAGNOSIS — Z1231 Encounter for screening mammogram for malignant neoplasm of breast: Secondary | ICD-10-CM | POA: Diagnosis not present

## 2011-09-28 ENCOUNTER — Other Ambulatory Visit: Payer: Self-pay

## 2011-09-28 MED ORDER — HYDROCHLOROTHIAZIDE 12.5 MG PO CAPS: 12.5000 mg | ORAL_CAPSULE | Freq: Every day | ORAL | Status: DC

## 2011-09-28 NOTE — Telephone Encounter (Signed)
..   Requested Prescriptions   Signed Prescriptions Disp Refills  . hydrochlorothiazide (MICROZIDE) 12.5 MG capsule 90 capsule 3    Sig: Take 1 capsule (12.5 mg total) by mouth daily.    Authorizing Provider: Willa Rough D    Ordering User: Christella Hartigan, Shann Lewellyn Judie Petit

## 2011-10-03 ENCOUNTER — Ambulatory Visit (INDEPENDENT_AMBULATORY_CARE_PROVIDER_SITE_OTHER): Payer: Medicare Other | Admitting: Cardiology

## 2011-10-03 ENCOUNTER — Encounter: Payer: Self-pay | Admitting: Cardiology

## 2011-10-03 DIAGNOSIS — R609 Edema, unspecified: Secondary | ICD-10-CM | POA: Diagnosis not present

## 2011-10-03 DIAGNOSIS — R0602 Shortness of breath: Secondary | ICD-10-CM | POA: Diagnosis not present

## 2011-10-03 DIAGNOSIS — I10 Essential (primary) hypertension: Secondary | ICD-10-CM | POA: Diagnosis not present

## 2011-10-03 NOTE — Patient Instructions (Signed)
Your physician wants you to follow-up in: 1 year. You will receive a reminder letter in the mail two months in advance. If you don't receive a letter, please call our office to schedule the follow-up appointment.  

## 2011-10-03 NOTE — Assessment & Plan Note (Signed)
Blood pressure is well controlled. No change in therapy. 

## 2011-10-03 NOTE — Assessment & Plan Note (Signed)
There is only trace edema. No further workup.

## 2011-10-03 NOTE — Progress Notes (Signed)
HPI Patient is seen for cardiology followup. I saw her lastDecember, 2012. She had some shortness of breath with some mild volume overload. She does have normal LV function. Hydrochlorothiazide was started. She improved. She does have some diastolic dysfunction. She's not having significant symptoms at this time.  Allergies  Allergen Reactions  . Codeine   . Morphine   . Penicillins   . Sulfonamide Derivatives     Current Outpatient Prescriptions  Medication Sig Dispense Refill  . amlodipine-atorvastatin (CADUET) 10-20 MG per tablet Take 1 tablet by mouth daily.        . Calcium Carbonate-Vitamin D (CALTRATE 600+D) 600-400 MG-UNIT per tablet Take 2 tablets by mouth daily.        . cholecalciferol (VITAMIN D) 400 UNITS TABS Take 1,000 Units by mouth.        . EVISTA 60 MG tablet Take 40 mg by mouth daily.       . fish oil-omega-3 fatty acids 1000 MG capsule Take 1 g by mouth daily.        . hydrochlorothiazide (MICROZIDE) 12.5 MG capsule Take 1 capsule (12.5 mg total) by mouth daily.  90 capsule  3  . metoprolol (TOPROL-XL) 100 MG 24 hr tablet Take 100 mg by mouth daily.        . Multiple Vitamin (MULTIVITAMIN) tablet Take 1 tablet by mouth daily.        Marland Kitchen olmesartan (BENICAR) 40 MG tablet Take 40 mg by mouth daily.        . RABEprazole (ACIPHEX) 20 MG tablet Take 20 mg by mouth daily.        . pantoprazole (PROTONIX) 40 MG tablet Take 1 tablet by mouth daily.        History   Social History  . Marital Status: Married    Spouse Name: N/A    Number of Children: N/A  . Years of Education: N/A   Occupational History  . Not on file.   Social History Main Topics  . Smoking status: Never Smoker   . Smokeless tobacco: Never Used  . Alcohol Use: Not on file  . Drug Use: Not on file  . Sexually Active: Not on file   Other Topics Concern  . Not on file   Social History Narrative  . No narrative on file    No family history on file.  Past Medical History  Diagnosis Date  .  Shortness of breath   . Carotid artery disease     , Doppler, 2003  . Dyslipidemia   . Mitral valve prolapse     Echo, 2009, very mild intermittent prolapse of the posterior leaflet, no MR  . Ejection fraction     EF 60-70%, echo, December, 2009 / EF 55-60%, December, 2012  . Aortic insufficiency     Mild, echo, December, 2009  . Hypertension   . DJD (degenerative joint disease)   . Chest pain     Catheterization, 2003, no significant CAD  . Edema     November, 2012  . Diastolic dysfunction     Mild diastolic dysfunction, echo, December, 2012    Past Surgical History  Procedure Date  . Cardiac catheterization sept 2003    no significant cad  . Appendectomy   . Tonsillectomy   . Hysterectomy--unknown type     ROS  Patient denies fever, chills, headache, sweats, rash, change in vision, change in hearing, chest pain, cough, nausea vomiting, urinary symptoms. All other systems are reviewed and are  negative.  PHYSICAL EXAM Patient's here with her husband. She is oriented to person time and place. Affect is normal. Lungs are clear. Respiratory effort is nonlabored. Cardiac exam reveals S1 and S2. There no clicks or significant murmurs. The abdomen is soft. There is trace peripheral edema. Filed Vitals:   10/03/11 0943  BP: 118/64  Pulse: 80  Height: 5\' 2"  (1.575 m)  Weight: 146 lb (66.225 kg)   ASSESSMENT & PLAN

## 2011-10-03 NOTE — Assessment & Plan Note (Signed)
Shortness of breath is stable. She is fully active. I feel that we should not change her meds or proceed with any further workup.

## 2011-10-12 DIAGNOSIS — G5760 Lesion of plantar nerve, unspecified lower limb: Secondary | ICD-10-CM | POA: Diagnosis not present

## 2011-10-12 DIAGNOSIS — L03039 Cellulitis of unspecified toe: Secondary | ICD-10-CM | POA: Diagnosis not present

## 2011-10-31 DIAGNOSIS — G5760 Lesion of plantar nerve, unspecified lower limb: Secondary | ICD-10-CM | POA: Diagnosis not present

## 2011-10-31 DIAGNOSIS — M79609 Pain in unspecified limb: Secondary | ICD-10-CM | POA: Diagnosis not present

## 2011-11-17 ENCOUNTER — Encounter: Payer: Self-pay | Admitting: Cardiology

## 2011-11-17 DIAGNOSIS — E559 Vitamin D deficiency, unspecified: Secondary | ICD-10-CM | POA: Diagnosis not present

## 2011-11-17 DIAGNOSIS — I10 Essential (primary) hypertension: Secondary | ICD-10-CM | POA: Diagnosis not present

## 2011-11-17 DIAGNOSIS — E785 Hyperlipidemia, unspecified: Secondary | ICD-10-CM | POA: Diagnosis not present

## 2011-11-30 DIAGNOSIS — H5310 Unspecified subjective visual disturbances: Secondary | ICD-10-CM | POA: Diagnosis not present

## 2011-11-30 DIAGNOSIS — E119 Type 2 diabetes mellitus without complications: Secondary | ICD-10-CM | POA: Diagnosis not present

## 2011-11-30 DIAGNOSIS — H10409 Unspecified chronic conjunctivitis, unspecified eye: Secondary | ICD-10-CM | POA: Diagnosis not present

## 2011-11-30 DIAGNOSIS — Z961 Presence of intraocular lens: Secondary | ICD-10-CM | POA: Diagnosis not present

## 2011-12-07 DIAGNOSIS — Z1212 Encounter for screening for malignant neoplasm of rectum: Secondary | ICD-10-CM | POA: Diagnosis not present

## 2012-01-15 DIAGNOSIS — M810 Age-related osteoporosis without current pathological fracture: Secondary | ICD-10-CM | POA: Diagnosis not present

## 2012-02-20 ENCOUNTER — Encounter: Payer: Self-pay | Admitting: Cardiology

## 2012-02-20 DIAGNOSIS — R5381 Other malaise: Secondary | ICD-10-CM | POA: Diagnosis not present

## 2012-02-20 DIAGNOSIS — I10 Essential (primary) hypertension: Secondary | ICD-10-CM | POA: Diagnosis not present

## 2012-02-20 DIAGNOSIS — E559 Vitamin D deficiency, unspecified: Secondary | ICD-10-CM | POA: Diagnosis not present

## 2012-02-20 DIAGNOSIS — E785 Hyperlipidemia, unspecified: Secondary | ICD-10-CM | POA: Diagnosis not present

## 2012-02-20 DIAGNOSIS — E119 Type 2 diabetes mellitus without complications: Secondary | ICD-10-CM | POA: Diagnosis not present

## 2012-02-29 DIAGNOSIS — E785 Hyperlipidemia, unspecified: Secondary | ICD-10-CM | POA: Diagnosis not present

## 2012-02-29 DIAGNOSIS — J441 Chronic obstructive pulmonary disease with (acute) exacerbation: Secondary | ICD-10-CM | POA: Diagnosis not present

## 2012-02-29 DIAGNOSIS — I10 Essential (primary) hypertension: Secondary | ICD-10-CM | POA: Diagnosis not present

## 2012-04-22 DIAGNOSIS — M810 Age-related osteoporosis without current pathological fracture: Secondary | ICD-10-CM | POA: Diagnosis not present

## 2012-04-22 DIAGNOSIS — E785 Hyperlipidemia, unspecified: Secondary | ICD-10-CM | POA: Diagnosis not present

## 2012-05-30 DIAGNOSIS — E559 Vitamin D deficiency, unspecified: Secondary | ICD-10-CM | POA: Diagnosis not present

## 2012-05-30 DIAGNOSIS — I10 Essential (primary) hypertension: Secondary | ICD-10-CM | POA: Diagnosis not present

## 2012-05-30 DIAGNOSIS — E785 Hyperlipidemia, unspecified: Secondary | ICD-10-CM | POA: Diagnosis not present

## 2012-06-05 DIAGNOSIS — I10 Essential (primary) hypertension: Secondary | ICD-10-CM | POA: Diagnosis not present

## 2012-06-05 DIAGNOSIS — Z23 Encounter for immunization: Secondary | ICD-10-CM | POA: Diagnosis not present

## 2012-06-05 DIAGNOSIS — E785 Hyperlipidemia, unspecified: Secondary | ICD-10-CM | POA: Diagnosis not present

## 2012-08-28 DIAGNOSIS — L509 Urticaria, unspecified: Secondary | ICD-10-CM | POA: Diagnosis not present

## 2012-09-05 DIAGNOSIS — L820 Inflamed seborrheic keratosis: Secondary | ICD-10-CM | POA: Diagnosis not present

## 2012-09-05 DIAGNOSIS — D235 Other benign neoplasm of skin of trunk: Secondary | ICD-10-CM | POA: Diagnosis not present

## 2012-09-23 ENCOUNTER — Encounter: Payer: Self-pay | Admitting: Cardiology

## 2012-09-23 ENCOUNTER — Ambulatory Visit (INDEPENDENT_AMBULATORY_CARE_PROVIDER_SITE_OTHER): Payer: Medicare Other | Admitting: Cardiology

## 2012-09-23 VITALS — BP 102/50 | HR 64 | Ht 62.0 in | Wt 145.8 lb

## 2012-09-23 DIAGNOSIS — I059 Rheumatic mitral valve disease, unspecified: Secondary | ICD-10-CM

## 2012-09-23 DIAGNOSIS — R0602 Shortness of breath: Secondary | ICD-10-CM

## 2012-09-23 DIAGNOSIS — I10 Essential (primary) hypertension: Secondary | ICD-10-CM

## 2012-09-23 DIAGNOSIS — I779 Disorder of arteries and arterioles, unspecified: Secondary | ICD-10-CM

## 2012-09-23 DIAGNOSIS — I359 Nonrheumatic aortic valve disorder, unspecified: Secondary | ICD-10-CM

## 2012-09-23 DIAGNOSIS — R000 Tachycardia, unspecified: Secondary | ICD-10-CM | POA: Diagnosis not present

## 2012-09-23 DIAGNOSIS — I341 Nonrheumatic mitral (valve) prolapse: Secondary | ICD-10-CM

## 2012-09-23 DIAGNOSIS — E785 Hyperlipidemia, unspecified: Secondary | ICD-10-CM

## 2012-09-23 DIAGNOSIS — I351 Nonrheumatic aortic (valve) insufficiency: Secondary | ICD-10-CM

## 2012-09-23 DIAGNOSIS — R079 Chest pain, unspecified: Secondary | ICD-10-CM

## 2012-09-23 NOTE — Assessment & Plan Note (Signed)
The patient is on a combination medicine that includes atorvastatin.

## 2012-09-23 NOTE — Assessment & Plan Note (Signed)
The patient has a new problem and complains of some nighttime tachycardia. She does not seem to have this during the day. I've asked her to break her Toprol dose in half and take half of it later in the day. I have not arranged for any type of monitor. She will call to let us know how she is feeling with this change.

## 2012-09-23 NOTE — Progress Notes (Signed)
HPI  Patient is seen to followup some shortness of breath and mild volume overload. She does have mild diastolic dysfunction. She has good systolic LV function. Overall she's done well. However she mentions that now that she senses increased heart rate at nighttime. On one occasion it was marked. She is on a beta blocker but takes he entire dose in the morning. She has not had syncope or presyncope.  Allergies  Allergen Reactions  . Codeine   . Morphine   . Penicillins   . Sulfonamide Derivatives     Current Outpatient Prescriptions  Medication Sig Dispense Refill  . amlodipine-atorvastatin (CADUET) 10-20 MG per tablet Take 1 tablet by mouth daily.        . Calcium Carbonate-Vitamin D (CALTRATE 600+D) 600-400 MG-UNIT per tablet Take 2 tablets by mouth daily.        . cholecalciferol (VITAMIN D) 400 UNITS TABS Take 1,000 Units by mouth.        . EVISTA 60 MG tablet Take 40 mg by mouth daily.       . fish oil-omega-3 fatty acids 1000 MG capsule Take 1 g by mouth daily.        . hydrochlorothiazide (MICROZIDE) 12.5 MG capsule Take 1 capsule (12.5 mg total) by mouth daily.  90 capsule  3  . metoprolol (TOPROL-XL) 100 MG 24 hr tablet Take 100 mg by mouth daily.        . Multiple Vitamin (MULTIVITAMIN) tablet Take 1 tablet by mouth daily.        Marland Kitchen olmesartan (BENICAR) 40 MG tablet Take 40 mg by mouth daily.        . pantoprazole (PROTONIX) 40 MG tablet Take 1 tablet by mouth daily.      . risedronate (ACTONEL) 35 MG tablet Take 35 mg by mouth every 7 (seven) days. with water on empty stomach, nothing by mouth or lie down for next 30 minutes.       No current facility-administered medications for this visit.    History   Social History  . Marital Status: Married    Spouse Name: N/A    Number of Children: N/A  . Years of Education: N/A   Occupational History  . Not on file.   Social History Main Topics  . Smoking status: Never Smoker   . Smokeless tobacco: Never Used  . Alcohol  Use: Not on file  . Drug Use: Not on file  . Sexually Active: Not on file   Other Topics Concern  . Not on file   Social History Narrative  . No narrative on file    No family history on file.  Past Medical History  Diagnosis Date  . Shortness of breath   . Carotid artery disease     , Doppler, 2003  . Dyslipidemia   . Mitral valve prolapse     Echo, 2009, very mild intermittent prolapse of the posterior leaflet, no MR  . Ejection fraction     EF 60-70%, echo, December, 2009 / EF 55-60%, December, 2012  . Aortic insufficiency     Mild, echo, December, 2009  . Hypertension   . DJD (degenerative joint disease)   . Chest pain     Catheterization, 2003, no significant CAD  . Edema     November, 2012  . Diastolic dysfunction     Mild diastolic dysfunction, echo, December, 2012  . Tachycardia     Nighttime tachycardia, February, 2014    Past Surgical History  Procedure Laterality Date  . Cardiac catheterization  sept 2003    no significant cad  . Appendectomy    . Tonsillectomy    . Hysterectomy--unknown type      Patient Active Problem List  Diagnosis  . Shortness of breath  . Carotid artery disease  . Dyslipidemia  . Mitral valve prolapse  . Ejection fraction  . Aortic insufficiency  . Hypertension  . Chest pain  . Edema  . Tachycardia   ROS  Patient denies fever, chills, headache, sweats, rash, change in vision, change in hearing, chest pain, cough, nausea vomiting, urinary symptoms. All other systems are reviewed and are negative.  PHYSICAL EXAM  Patient is here with her husband. She is stable. She is oriented to person time and place. Affect is normal. There is no jugulovenous distention. Lungs are clear. Respiratory effort is nonlabored. Cardiac exam reveals S1 and S2. There are no clicks. There is a very soft systolic murmur. The abdomen is soft. There is no peripheral edema.  Filed Vitals:   09/23/12 1352  BP: 102/50  Pulse: 64  Height: 5\' 2"   (1.575 m)  Weight: 145 lb 12 oz (66.112 kg)  SpO2: 97%   EKG is done today and reviewed by me. There is slight J-point elevation. There is no significant abnormality. There is no significant change.  ASSESSMENT & PLAN

## 2012-09-23 NOTE — Assessment & Plan Note (Signed)
She has had mild aortic insufficiency and there is no need for follow up echo at this time.

## 2012-09-23 NOTE — Assessment & Plan Note (Signed)
She's not had any significant chest pain. No further workup.

## 2012-09-23 NOTE — Patient Instructions (Addendum)
Your physician has recommended you make the following change in your medication: Break your Toprol in half and take one half tablet twice daily  Your physician has requested that you have a carotid duplex. This test is an ultrasound of the carotid arteries in your neck. It looks at blood flow through these arteries that supply the brain with blood. Allow one hour for this exam. There are no restrictions or special instructions.  Your physician wants you to follow-up in: 1 year.   You will receive a reminder letter in the mail two months in advance. If you don't receive a letter, please call our office to schedule the follow-up appointment.

## 2012-09-23 NOTE — Assessment & Plan Note (Signed)
She is not currently having any significant shortness of breath. No further workup.

## 2012-09-23 NOTE — Assessment & Plan Note (Signed)
The patient has known carotid disease. Her last Doppler was December, 2011. She had 0-39% bilateral disease with some external carotid artery stenosis. It is now time for followup Doppler and this will be arranged.

## 2012-09-23 NOTE — Assessment & Plan Note (Signed)
Historically there is only trivial mitral regurgitation. She does not need a follow up echo.

## 2012-09-23 NOTE — Assessment & Plan Note (Signed)
There is good blood pressure control. No change in therapy.

## 2012-09-24 DIAGNOSIS — I10 Essential (primary) hypertension: Secondary | ICD-10-CM | POA: Diagnosis not present

## 2012-09-24 DIAGNOSIS — R5381 Other malaise: Secondary | ICD-10-CM | POA: Diagnosis not present

## 2012-09-24 DIAGNOSIS — E785 Hyperlipidemia, unspecified: Secondary | ICD-10-CM | POA: Diagnosis not present

## 2012-09-24 DIAGNOSIS — E559 Vitamin D deficiency, unspecified: Secondary | ICD-10-CM | POA: Diagnosis not present

## 2012-09-24 DIAGNOSIS — R5383 Other fatigue: Secondary | ICD-10-CM | POA: Diagnosis not present

## 2012-10-01 ENCOUNTER — Encounter (INDEPENDENT_AMBULATORY_CARE_PROVIDER_SITE_OTHER): Payer: Medicare Other

## 2012-10-01 DIAGNOSIS — I6529 Occlusion and stenosis of unspecified carotid artery: Secondary | ICD-10-CM

## 2012-10-02 ENCOUNTER — Encounter: Payer: Self-pay | Admitting: Cardiology

## 2012-10-03 ENCOUNTER — Telehealth: Payer: Self-pay | Admitting: Cardiology

## 2012-10-03 DIAGNOSIS — I10 Essential (primary) hypertension: Secondary | ICD-10-CM | POA: Diagnosis not present

## 2012-10-03 DIAGNOSIS — E785 Hyperlipidemia, unspecified: Secondary | ICD-10-CM | POA: Diagnosis not present

## 2012-10-03 NOTE — Telephone Encounter (Signed)
N/A.  LMTC. 

## 2012-10-03 NOTE — Telephone Encounter (Signed)
New problem    Returning call back to nurse.   

## 2012-10-04 NOTE — Telephone Encounter (Signed)
Carotid doppler results were given to Melissa Carpenter.

## 2012-10-26 ENCOUNTER — Other Ambulatory Visit: Payer: Self-pay | Admitting: *Deleted

## 2012-11-05 DIAGNOSIS — Z1231 Encounter for screening mammogram for malignant neoplasm of breast: Secondary | ICD-10-CM | POA: Diagnosis not present

## 2012-11-22 ENCOUNTER — Encounter: Payer: Self-pay | Admitting: Family Medicine

## 2012-12-03 DIAGNOSIS — E119 Type 2 diabetes mellitus without complications: Secondary | ICD-10-CM | POA: Diagnosis not present

## 2012-12-03 DIAGNOSIS — H04129 Dry eye syndrome of unspecified lacrimal gland: Secondary | ICD-10-CM | POA: Diagnosis not present

## 2012-12-03 DIAGNOSIS — Z961 Presence of intraocular lens: Secondary | ICD-10-CM | POA: Diagnosis not present

## 2012-12-03 DIAGNOSIS — H1045 Other chronic allergic conjunctivitis: Secondary | ICD-10-CM | POA: Diagnosis not present

## 2012-12-19 ENCOUNTER — Other Ambulatory Visit: Payer: Self-pay | Admitting: Family Medicine

## 2013-01-13 ENCOUNTER — Other Ambulatory Visit (INDEPENDENT_AMBULATORY_CARE_PROVIDER_SITE_OTHER): Payer: Medicare Other

## 2013-01-13 DIAGNOSIS — E559 Vitamin D deficiency, unspecified: Secondary | ICD-10-CM

## 2013-01-13 DIAGNOSIS — I10 Essential (primary) hypertension: Secondary | ICD-10-CM | POA: Diagnosis not present

## 2013-01-13 DIAGNOSIS — Z1212 Encounter for screening for malignant neoplasm of rectum: Secondary | ICD-10-CM

## 2013-01-13 DIAGNOSIS — R5381 Other malaise: Secondary | ICD-10-CM

## 2013-01-13 DIAGNOSIS — E785 Hyperlipidemia, unspecified: Secondary | ICD-10-CM

## 2013-01-13 LAB — BASIC METABOLIC PANEL WITH GFR
BUN: 15 mg/dL (ref 6–23)
GFR, Est African American: >89 mL/min
GFR, Est Non African American: 88 mL/min
Potassium: 4.1 mEq/L (ref 3.5–5.3)
Sodium: 140 mEq/L (ref 135–145)

## 2013-01-13 LAB — HEPATIC FUNCTION PANEL
ALT: 36 U/L — ABNORMAL HIGH (ref 0–35)
AST: 31 U/L (ref 0–37)
Bilirubin, Direct: 0.1 mg/dL (ref 0.0–0.3)
Total Protein: 7.3 g/dL (ref 6.0–8.3)

## 2013-01-13 LAB — POCT CBC
Granulocyte percent: 66 %G (ref 37–80)
Lymph, poc: 1.3 (ref 0.6–3.4)
MPV: 7 fL (ref 0–99.8)
POC Granulocyte: 2.8 (ref 2–6.9)
POC LYMPH PERCENT: 31.3 %L (ref 10–50)
Platelet Count, POC: 230 10*3/uL (ref 142–424)
RDW, POC: 12.4 %
WBC: 4.2 10*3/uL — AB (ref 4.6–10.2)

## 2013-01-13 NOTE — Progress Notes (Signed)
Patient came in for labs only.

## 2013-01-14 LAB — NMR LIPOPROFILE WITH LIPIDS
Cholesterol, Total: 165 mg/dL (ref <200)
HDL Particle Number: 36.4 umol/L (ref >=30.5)
LDL (calc): 78 mg/dL (ref <100)
LDL Particle Number: 706 nmol/L (ref <1000)
LDL Size: 20.8 nm (ref >20.5)
LP-IR Score: 45 (ref <=45)
Large VLDL-P: 2.9 nmol/L — ABNORMAL HIGH (ref <=2.7)
Small LDL Particle Number: 349 nmol/L (ref <=527)
VLDL Size: 45.5 nm (ref <=46.6)

## 2013-01-16 ENCOUNTER — Telehealth: Payer: Self-pay | Admitting: *Deleted

## 2013-01-16 NOTE — Telephone Encounter (Signed)
Message copied by Bearl Mulberry on Thu Jan 16, 2013  6:16 PM ------      Message from: Ernestina Penna      Created: Tue Jan 14, 2013  8:32 PM       Electrolytes within normal limits. Blood sugar slightly elevated at 113. Kidney function test was good.      1 liver function tests were slightly elevated. All others were within normal limit.      Advanced lipid testing revealed a total LDL P. that was at goal of less than 1000 . The total LDL particle number was 706 . Triglycerides were elevated at 240.-------------- will continue to emphasize aggressive therapeutic lifestyle changes and possibly treatment with fenobrate lower triglyceride, see where she is taking now???????      Vitamin D level is excellent at 81 ------

## 2013-01-16 NOTE — Telephone Encounter (Signed)
Pt notified of lab results

## 2013-01-29 ENCOUNTER — Ambulatory Visit (INDEPENDENT_AMBULATORY_CARE_PROVIDER_SITE_OTHER): Payer: Medicare Other | Admitting: Physician Assistant

## 2013-01-29 VITALS — BP 120/62 | HR 68 | Temp 99.1°F | Ht 62.0 in | Wt 145.0 lb

## 2013-01-29 DIAGNOSIS — T148 Other injury of unspecified body region: Secondary | ICD-10-CM

## 2013-01-29 DIAGNOSIS — W57XXXA Bitten or stung by nonvenomous insect and other nonvenomous arthropods, initial encounter: Secondary | ICD-10-CM

## 2013-01-29 MED ORDER — DOXYCYCLINE HYCLATE 100 MG PO TABS: 100.0000 mg | ORAL_TABLET | Freq: Two times a day (BID) | ORAL | Status: DC

## 2013-01-29 NOTE — Patient Instructions (Signed)
Ehrlichiosis and Anaplasmosis Ehrlichiosis and anaplasmosis are diseases caused by bacteria and carried by ticks. Other names for these infections are:  Human monocytic ehrlichiosis (HME).  Human granulocytotropic anaplasmosis (HGA). HME mostly occurs in the [place] and [place], where the lone star tick lives. However, infections have occurred in 47 states. HGA infections are limited to fewer geographic locations. Most cases are reported from [place], [place], [place], [place], and [place]. This distribution is almost identical to that of Lyme disease because of the shared species of ixodid ticks (wood ticks, deer ticks). CAUSES   HME is caused by Ehrlichia chaffeensis and other closely related ehrlichia bacteria.  HGA is caused by the bacteria Anaplasma phagocytophilum. An infected adult tick transmits the infection by biting a human. Once a tick gains access to human skin, it generally climbs upward until it reaches a more protected area. This is often the back of the knee, groin, navel, armpit, ears, or nape of the neck. It then begins the slow process of embedding itself in the skin. Adult ticks are active during warmer times of the year. For this reason, most infections occur between late spring and early fall. SYMPTOMS  Many infected people have no symptoms. For those with symptoms, HME and HGA cause similar illnesses. Symptoms typically begin 1 week or more after a tick bite and may include:  Fever.  Headache.  Chills or shaking.  Fatigue.  Muscle pain.  Nausea.  Loss of appetite.  Vomiting.  Diarrhea. Symptoms commonly last for 1 to 3 weeks if a patient is not diagnosed or not treated with an antibiotic. Extremely severe disease is rare, but occasional deaths from infection have been reported. DIAGNOSIS  Diagnosis is suggested by a history of tick bites or potential exposure to ticks. Blood tests may show abnormalities of liver  function and low counts of white blood cells and platelets. To confirm the diagnosis, the bacteria must be found in a smear of blood on a microscope slide or during testing of the liquid part of your blood (serum). TREATMENT  Treatment with an antibiotic is almost always effective in eliminating symptoms within a couple days and curing the infection.  PREVENTION Ticks prefer to hide in shady, moist ground. However, they can often be found above the ground clinging to tall grass, brush, shrubs, and low tree branches. They also inhabit lawns and gardens, especially at the edges of woodlands and around old stone walls. Within the normal geographic areas where HME and HGA occur, no vegetated area can be considered completely free of infected ticks. In tick-infested areas, the best precaution against infection is to avoid contact with soil, leaf litter, and vegetation as much as possible. Campers, hikers, field workers, and others who spend time in wooded, brushy, or tall grassy areas can avoid exposure to ticks by using the following precautions:  Wear light-colored clothing with a tight weave to spot ticks more easily and prevent contact with the skin.  Wear long pants tucked into socks, long sleeve shirts tucked into pants, and enclosed shoes or boots.  Use insect repellent. Spray clothes with insect repellent containing either DEET or permethrin. Only DEET can be used on exposed skin. Make sure to follow the manufacturer's directions carefully.  Wear a hat and keep long hair pulled back.  Stay on cleared, well-worn trails whenever possible.  Check yourself and others frequently for the presence of ticks on clothes. If you find one tick, there may be more. Check thoroughly.  Remove clothes after leaving tick-infested areas and, if possible, wash them to eliminate any unseen ticks. Check yourself, your children, and any pets from head to toe for the presence of ticks.  When attached ticks are found,  you can greatly reduce your chances of getting HME and HGA if you remove them as soon as possible. Use a tweezer to grab hold of the tick by its mouth parts and pull it off.  Shower and shampoo after possible exposure to ticks. HOME CARE INSTRUCTIONS Take your antibiotics as directed. Finish them even if you start to feel better. SEEK MEDICAL CARE IF:   You have a fever.  You develop a headache.  You develop fatigue.  You develop muscle pain.  You develop nausea, vomiting, or diarrhea. MAKE SURE YOU:  Understand these instructions.  Will watch your condition.  Will get help right away if you are not doing well or get worse. Document Released: 07/28/2000 Document Revised: 10/23/2011 Document Reviewed: 02/03/2011 [facility] Patient Information 2014 [place], [place].

## 2013-01-29 NOTE — Progress Notes (Signed)
Subjective:     Patient ID: Melissa Carpenter, female   DOB: 1934-03-28, 79 y.o.   MRN: [id number]  HPI Pt with tick bite around 2 weeks ago Pt now with general malaise, joint pain, and rash to the lower ext Pt also with tactile fever  Review of Systems  All other systems reviewed and are negative.       Objective:   Physical Exam  Nursing note and vitals reviewed.  Bite to post prox R leg No erythema, edema, or induration noted No swelling to the joints Rash only to LE at distal tib + pitting edema bilat    Assessment:     1. Tick bite        Plan:     Discussed with pt I think the LE rash is actually from edema I would like her to use OTC compression hose Doxycycline rx- sun precaut reviewed F/U prn

## 2013-02-06 ENCOUNTER — Encounter: Payer: Self-pay | Admitting: Family Medicine

## 2013-02-06 ENCOUNTER — Ambulatory Visit (INDEPENDENT_AMBULATORY_CARE_PROVIDER_SITE_OTHER): Payer: Medicare Other | Admitting: Family Medicine

## 2013-02-06 VITALS — BP 123/61 | HR 64 | Temp 97.6°F | Ht 59.5 in | Wt 143.4 lb

## 2013-02-06 DIAGNOSIS — K219 Gastro-esophageal reflux disease without esophagitis: Secondary | ICD-10-CM

## 2013-02-06 DIAGNOSIS — I10 Essential (primary) hypertension: Secondary | ICD-10-CM | POA: Diagnosis not present

## 2013-02-06 DIAGNOSIS — E785 Hyperlipidemia, unspecified: Secondary | ICD-10-CM | POA: Diagnosis not present

## 2013-02-06 DIAGNOSIS — R5381 Other malaise: Secondary | ICD-10-CM | POA: Diagnosis not present

## 2013-02-06 DIAGNOSIS — R5383 Other fatigue: Secondary | ICD-10-CM

## 2013-02-06 NOTE — Progress Notes (Signed)
  Subjective:    Patient ID: Melissa Carpenter, female    DOB: 09-12-1933, 79 y.o.   MRN: [id number]  HPI Patient comes in today for followup of chronic medical problems. This includes elevated cholesterol, high blood pressure, reflux, and general fatigue.   Review of Systems  Constitutional: Positive for fatigue.  HENT: Positive for postnasal drip.   Eyes: Positive for redness.  Respiratory: Positive for cough (dry, constant). Negative for shortness of breath.   Cardiovascular: Negative.   Gastrointestinal: Positive for constipation (intermitent). Negative for abdominal pain.  Genitourinary: Positive for frequency. Negative for dysuria.  Musculoskeletal: Positive for back pain (all over) and arthralgias (knees, hips).  Allergic/Immunologic: Positive for environmental allergies (seasonal).  Neurological: Negative.   Hematological: Negative.   Psychiatric/Behavioral: Negative.        Objective:   Physical Exam .BP 123/61  Pulse 64  Temp(Src) 97.6 F (36.4 C) (Oral)  Ht 4' 11.5" (1.511 m)  Wt 143 lb 6.4 oz (65.046 kg)  BMI 28.49 kg/m2  The patient appeared well nourished and normally developed, alert and oriented to time and place. Speech, behavior and judgement appear normal. Vital signs as documented.  Head exam is unremarkable. No scleral icterus or pallor noted. Ears nose throat mouth within normal limits Neck is without jugular venous distension, thyromegally, or carotid bruits. Carotid upstrokes are brisk bilaterally. No cervical adenopathy. Lungs are clear anteriorly and posteriorly to auscultation. Normal respiratory effort. Cardiac exam reveals regular rate and rhythm at 72 per minute. First and second heart sounds normal.  No murmurs, rubs or gallops.  Abdominal exam reveals normal bowl sounds, no masses, no organomegaly and no aortic enlargement. No inguinal adenopathy. Slight tenderness in the right lower quadrant Extremities are nonedematous and both femoral  pulses are  normal. Skin without pallor or jaundice.  Warm and dry, without rash. Neurologic exam reveals normal deep tendon reflexes and normal sensation.           Assessment & Plan:  1. Hyperlipemia  2. Fatigue  3. Hypertension  4. GERD (gastroesophageal reflux disease)  Patient Instructions  Fall precautions discussed Continue current meds and therapeutic lifestyle changes   In the future and after checking cholesterol numbers, if the triglycerides remain elevated consider trying fenobrate. This was discussed with the patient today.

## 2013-02-06 NOTE — Patient Instructions (Addendum)
Fall precautions discussed Continue current meds and therapeutic lifestyle changes 

## 2013-02-19 ENCOUNTER — Encounter: Payer: Self-pay | Admitting: Nurse Practitioner

## 2013-02-19 ENCOUNTER — Ambulatory Visit (INDEPENDENT_AMBULATORY_CARE_PROVIDER_SITE_OTHER): Payer: Medicare Other | Admitting: Nurse Practitioner

## 2013-02-19 VITALS — BP 115/65 | HR 67 | Temp 97.6°F | Ht 60.0 in | Wt 143.0 lb

## 2013-02-19 DIAGNOSIS — Z01419 Encounter for gynecological examination (general) (routine) without abnormal findings: Secondary | ICD-10-CM | POA: Diagnosis not present

## 2013-02-19 LAB — POCT URINALYSIS DIPSTICK
Bilirubin, UA: NEGATIVE
Ketones, UA: NEGATIVE
Leukocytes, UA: NEGATIVE
Spec Grav, UA: 1.01

## 2013-02-19 NOTE — Progress Notes (Signed)
  Subjective:    Patient ID: Melissa Carpenter, female    DOB: 02-13-1934, 79 y.o.   MRN: [id number]  HPI Patient is a regular patient of Dr. Christell Constant and is sent to see me today for a pelvic exam only. She recently saw Dr. Christell Constant last month an dis doing quite well. She has no complaints today.    Review of Systems  All other systems reviewed and are negative.       Objective:   Physical Exam  Constitutional: She is oriented to person, place, and time. She appears well-developed and well-nourished.  HENT:  Head: Normocephalic.  Right Ear: Hearing, tympanic membrane, external ear and ear canal normal.  Left Ear: Hearing, tympanic membrane, external ear and ear canal normal.  Nose: Nose normal.  Mouth/Throat: Uvula is midline and oropharynx is clear and moist.  Eyes: Conjunctivae and EOM are normal. Pupils are equal, round, and reactive to light.  Neck: Normal range of motion and full passive range of motion without pain. Neck supple. No JVD present. Carotid bruit is not present. No mass and no thyromegaly present.  Cardiovascular: Normal rate, normal heart sounds and intact distal pulses.   No murmur heard. Pulmonary/Chest: Effort normal and breath sounds normal. Right breast exhibits no inverted nipple, no mass, no nipple discharge, no skin change and no tenderness. Left breast exhibits no inverted nipple, no mass, no nipple discharge, no skin change and no tenderness.  Abdominal: Soft. Bowel sounds are normal. She exhibits no mass. There is no tenderness.  Genitourinary: Vagina normal. No breast swelling, tenderness, discharge or bleeding.  bimanual exam-No adnexal masses or tenderness.  Vaginal cuff intact  Musculoskeletal: Normal range of motion.  Lymphadenopathy:    She has no cervical adenopathy.  Neurological: She is alert and oriented to person, place, and time.  Skin: Skin is warm and dry.  Psychiatric: She has a normal mood and affect. Her behavior is normal. Judgment and thought  content normal.    BP 115/65  Pulse 67  Temp(Src) 97.6 F (36.4 C) (Oral)  Ht 5' (1.524 m)  Wt 143 lb (64.864 kg)  BMI 27.93 kg/m2  hemocult negative      Assessment & Plan:  1. Encounter for routine gynecological examination No PAP Keep follow-up appointment with Dr. Christell Constant - POCT urinalysis dipstick Camella-Margaret Daphine Deutscher, FNP

## 2013-02-19 NOTE — Patient Instructions (Signed)

## 2013-03-05 ENCOUNTER — Ambulatory Visit (INDEPENDENT_AMBULATORY_CARE_PROVIDER_SITE_OTHER): Payer: Medicare Other

## 2013-03-05 ENCOUNTER — Ambulatory Visit (INDEPENDENT_AMBULATORY_CARE_PROVIDER_SITE_OTHER): Payer: Medicare Other | Admitting: Pharmacist

## 2013-03-05 ENCOUNTER — Encounter: Payer: Self-pay | Admitting: Pharmacist

## 2013-03-05 VITALS — Ht 60.0 in | Wt 145.0 lb

## 2013-03-05 DIAGNOSIS — M810 Age-related osteoporosis without current pathological fracture: Secondary | ICD-10-CM | POA: Insufficient documentation

## 2013-03-05 DIAGNOSIS — M899 Disorder of bone, unspecified: Secondary | ICD-10-CM | POA: Diagnosis not present

## 2013-03-05 DIAGNOSIS — M949 Disorder of cartilage, unspecified: Secondary | ICD-10-CM | POA: Diagnosis not present

## 2013-03-05 MED ORDER — CALCIUM CARBONATE-VITAMIN D 600-400 MG-UNIT PO TABS: 1.0000 | ORAL_TABLET | Freq: Every day | ORAL | Status: AC
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
## 2013-03-05 NOTE — Patient Instructions (Addendum)

## 2013-03-13 ENCOUNTER — Other Ambulatory Visit: Payer: Self-pay | Admitting: Family Medicine

## 2013-03-29 ENCOUNTER — Other Ambulatory Visit: Payer: Self-pay | Admitting: Family Medicine

## 2013-04-05 ENCOUNTER — Other Ambulatory Visit: Payer: Self-pay | Admitting: Nurse Practitioner

## 2013-04-08 ENCOUNTER — Other Ambulatory Visit: Payer: Self-pay | Admitting: Family Medicine

## 2013-04-30 ENCOUNTER — Encounter: Payer: Self-pay | Admitting: Nurse Practitioner

## 2013-04-30 ENCOUNTER — Ambulatory Visit (INDEPENDENT_AMBULATORY_CARE_PROVIDER_SITE_OTHER): Payer: Medicare Other | Admitting: Nurse Practitioner

## 2013-04-30 VITALS — BP 105/52 | HR 68 | Temp 98.5°F | Ht 60.0 in | Wt 145.0 lb

## 2013-04-30 DIAGNOSIS — L723 Sebaceous cyst: Secondary | ICD-10-CM

## 2013-04-30 NOTE — Patient Instructions (Signed)

## 2013-04-30 NOTE — Progress Notes (Signed)
  Subjective:    Patient ID: Melissa Carpenter, female    DOB: Jan 10, 1934, 79 y.o.   MRN: [id number]  HPI  Patient has a lesion on her back that she is here today to have removed- It has been there for several years- hasn't changed .    Review of Systems  All other systems reviewed and are negative.       Objective:   Physical Exam  Constitutional: She appears well-developed and well-nourished.  Cardiovascular: Normal rate and normal heart sounds.   Pulmonary/Chest: Effort normal and breath sounds normal.  Skin:  3 cm sebaceous cyst mid upper back    Procedure: Permission given by patient LIdocaine 1 % with epi 1cc local Cleaned with betadine #11 blade Copious amounts of thick blackish brown thick cheesy appearing substance removed Packing inserted Dressing applied  BP 105/52  Pulse 68  Temp(Src) 98.5 F (36.9 C) (Oral)  Ht 5' (1.524 m)  Wt 145 lb (65.772 kg)  BMI 28.32 kg/m2     Assessment & Plan:  1. Sebaceous cyst I& D Care discussed- remove small amount of packing daily Follow up as needed watchg fo rsigns of infection  Shawnese-Margaret Daphine Deutscher, FNP

## 2013-05-26 ENCOUNTER — Ambulatory Visit (INDEPENDENT_AMBULATORY_CARE_PROVIDER_SITE_OTHER): Payer: Medicare Other

## 2013-05-26 DIAGNOSIS — Z23 Encounter for immunization: Secondary | ICD-10-CM

## 2013-06-10 ENCOUNTER — Ambulatory Visit: Payer: Medicare Other

## 2013-06-10 ENCOUNTER — Ambulatory Visit (INDEPENDENT_AMBULATORY_CARE_PROVIDER_SITE_OTHER): Payer: Medicare Other | Admitting: Family Medicine

## 2013-06-10 VITALS — BP 128/68 | HR 76 | Temp 98.2°F | Wt 147.0 lb

## 2013-06-10 DIAGNOSIS — R21 Rash and other nonspecific skin eruption: Secondary | ICD-10-CM

## 2013-06-10 LAB — POCT WET PREP WITH KOH: KOH Prep POC: NEGATIVE

## 2013-06-10 MED ORDER — KETOCONAZOLE 2 % EX CREA: TOPICAL_CREAM | Freq: Every day | CUTANEOUS | Status: DC

## 2013-06-10 NOTE — Patient Instructions (Signed)

## 2013-06-10 NOTE — Progress Notes (Signed)
  Subjective:    Patient ID: Melissa Carpenter, female    DOB: [birth year], 79 y.o.   MRN: [id number]  HPI This 79 y.o. female presents for evaluation of rash on right groin.   Review of Systems C/o right groin rash No chest pain, SOB, HA, dizziness, vision change, N/V, diarrhea, constipation, dysuria, urinary urgency or frequency, myalgias, arthralgias.     Objective:   Physical Exam Vital signs noted  Well developed well nourished female.  HEENT - Head atraumatic Normocephalic                Eyes - PERRLA, Conjuctiva - clear Sclera- Clear EOMI                Ears - EAC's Wnl TM's Wnl Gross Hearing WNL                Nose - Nares patent                 Throat - oropharanx wnl Respiratory - Lungs CTA bilateral Cardiac - RRR S1 and S2 without murmur GI - Abdomen soft Nontender and bowel sounds active x 4 Extremities - No edema. Neuro - Grossly intact. Skin - Rash in right groin  Results for orders placed in visit on 06/10/13  POCT WET PREP WITH KOH      Result Value Range   KOH Prep POC Negative        Assessment & Plan:  Rash - Plan: POCT Wet Prep with KOH, ketoconazole (NIZORAL) 2 % cream

## 2013-06-18 ENCOUNTER — Encounter: Payer: Self-pay | Admitting: Family Medicine

## 2013-06-18 ENCOUNTER — Ambulatory Visit (INDEPENDENT_AMBULATORY_CARE_PROVIDER_SITE_OTHER): Payer: Medicare Other | Admitting: Family Medicine

## 2013-06-18 VITALS — BP 127/62 | HR 71 | Temp 97.1°F | Ht 60.0 in | Wt 145.0 lb

## 2013-06-18 DIAGNOSIS — T7840XA Allergy, unspecified, initial encounter: Secondary | ICD-10-CM

## 2013-06-18 DIAGNOSIS — B356 Tinea cruris: Secondary | ICD-10-CM | POA: Diagnosis not present

## 2013-06-18 DIAGNOSIS — L259 Unspecified contact dermatitis, unspecified cause: Secondary | ICD-10-CM

## 2013-06-18 MED ORDER — PREDNISONE 50 MG PO TABS: ORAL_TABLET | ORAL | Status: DC

## 2013-06-18 MED ORDER — HYDROXYZINE HCL 25 MG PO TABS: 25.0000 mg | ORAL_TABLET | Freq: Three times a day (TID) | ORAL | Status: DC | PRN

## 2013-06-18 NOTE — Patient Instructions (Signed)

## 2013-06-20 MED ORDER — GRISEOFULVIN ULTRAMICROSIZE 250 MG PO TABS: 250.0000 mg | ORAL_TABLET | Freq: Two times a day (BID) | ORAL | Status: DC

## 2013-06-20 NOTE — Progress Notes (Signed)
  Subjective:    Patient ID: Melissa Carpenter, female    DOB: 22-Apr-1934, 79 y.o.   MRN: [id number]  HPI HPI  This patient complains of a RASH  Location: Chest and abdomen  Onset: 2-3 days  Course: Patient was seen for fungal infection of the groin than last  week. Patient was prescribed topical ketoconazole for treatment. Per patient, she has a sulfa allergy and she found out that the ketoconazole as sulfa in it. Patient has had progressive erythema and pruritus over the affected areas where the ketoconazole was placed. Patient last used medicine last night. No shortness of breath, throat itching. Oral pharyngeal function intact. Self-treated with: nothing  Improvement with treatment: n/a  History  Itching: yes  Tenderness: no  New medications/antibiotics: yes  Pet exposure: no  Recent travel or tropical exposure: no  New soaps, shampoos, detergent, clothing: no  Tick/insect exposure: no  Chemical Exposure: no  Red Flags  Feeling ill: no  Fever: no  Facial/tongue swelling/difficulty breathing: no  Diabetic or immunocompromised: no      Review of Systems  All other systems reviewed and are negative.       Objective:   Physical Exam  Constitutional: She appears well-developed and well-nourished.  HENT:  Head: Normocephalic.  Eyes: Conjunctivae are normal. Pupils are equal, round, and reactive to light.  Neck: Normal range of motion.  Cardiovascular: Normal rate and regular rhythm.   Pulmonary/Chest: Effort normal and breath sounds normal.  Abdominal: Soft.  Musculoskeletal: Normal range of motion.  Neurological: She is alert.  Skin: Rash noted.          Assessment & Plan:  Allergic reaction, initial encounter - Plan: predniSONE (DELTASONE) 50 MG tablet, hydrOXYzine (ATARAX/VISTARIL) 25 MG tablet  Contact dermatitis - Plan: predniSONE (DELTASONE) 50 MG tablet, hydrOXYzine (ATARAX/VISTARIL) 25 MG tablet  Tinea cruris - Plan: griseofulvin (GRIS-PEG) 250 MG  tablet  Overlapping contact dermatitis and allergic reaction  Will place on prednisone short term.  Oral griseofulvin for tinea cruris. Pt will need to come for repeat LFTs in 2 weeks.  Discussed general care and derm red flags.  Follow up as needed.

## 2013-07-11 ENCOUNTER — Other Ambulatory Visit: Payer: Self-pay | Admitting: Nurse Practitioner

## 2013-07-21 ENCOUNTER — Ambulatory Visit (INDEPENDENT_AMBULATORY_CARE_PROVIDER_SITE_OTHER): Payer: Medicare Other | Admitting: Family Medicine

## 2013-07-21 ENCOUNTER — Encounter: Payer: Self-pay | Admitting: Family Medicine

## 2013-07-21 VITALS — BP 113/65 | HR 63 | Temp 98.4°F | Ht 60.0 in | Wt 142.0 lb

## 2013-07-21 DIAGNOSIS — E785 Hyperlipidemia, unspecified: Secondary | ICD-10-CM | POA: Diagnosis not present

## 2013-07-21 DIAGNOSIS — R05 Cough: Secondary | ICD-10-CM

## 2013-07-21 DIAGNOSIS — E559 Vitamin D deficiency, unspecified: Secondary | ICD-10-CM

## 2013-07-21 DIAGNOSIS — M810 Age-related osteoporosis without current pathological fracture: Secondary | ICD-10-CM | POA: Diagnosis not present

## 2013-07-21 DIAGNOSIS — K449 Diaphragmatic hernia without obstruction or gangrene: Secondary | ICD-10-CM

## 2013-07-21 DIAGNOSIS — I10 Essential (primary) hypertension: Secondary | ICD-10-CM | POA: Diagnosis not present

## 2013-07-21 DIAGNOSIS — I779 Disorder of arteries and arterioles, unspecified: Secondary | ICD-10-CM | POA: Diagnosis not present

## 2013-07-21 DIAGNOSIS — K219 Gastro-esophageal reflux disease without esophagitis: Secondary | ICD-10-CM | POA: Insufficient documentation

## 2013-07-21 NOTE — Progress Notes (Signed)
Subjective:    Patient ID: Melissa Carpenter, female    DOB: January 17, 1934, 79 y.o.   MRN: [id number]  HPI Pt here for follow up and management of chronic medical problems. Patient has no real complaints except for cough and congestion.Her health maintenance issues were reviewed and they appear to be up-to-date except for getting a Prevnar vaccine. She will return to clinic next week to get her lab work done.      Patient Active Problem List   Diagnosis Date Noted  . Osteoporosis 03/05/2013  . Tachycardia   . Edema   . Shortness of breath   . Carotid artery disease   . Dyslipidemia   . Mitral valve prolapse   . Ejection fraction   . Aortic insufficiency   . Hypertension   . Chest pain    Outpatient Encounter Prescriptions as of 07/21/2013  Medication Sig  . ACTONEL 35 MG tablet TAKE 1 TABLET ON EMPTY STOMACH WITH FULL GLASS OF WATER DON'T LIE DOWN, DRINK OR EAT FOR 30 MINS  . amlodipine-atorvastatin (CADUET) 10-20 MG per tablet TAKE 1 TABLET AT BEDTIME  . aspirin EC 81 MG tablet Take 81 mg by mouth daily.  Marland Kitchen BENICAR 40 MG tablet TAKE 1 TABLET EVERY DAY  . Calcium Carbonate-Vitamin D (CALTRATE 600+D) 600-400 MG-UNIT per tablet Take 1 tablet by mouth daily.  . cholecalciferol (VITAMIN D) 400 UNITS TABS Take 1,000 Units by mouth.    . fish oil-omega-3 fatty acids 1000 MG capsule Take 1 g by mouth daily.    . metoprolol succinate (TOPROL-XL) 100 MG 24 hr tablet TAKE ONE TABLET BY MOUTH EVERY DAY  . Multiple Vitamin (MULTIVITAMIN) tablet Take 1 tablet by mouth daily.    . pantoprazole (PROTONIX) 40 MG tablet TAKE 1 TABLET EVERY DAY  . [DISCONTINUED] griseofulvin (GRIS-PEG) 250 MG tablet Take 1 tablet (250 mg total) by mouth 2 (two) times daily.  . [DISCONTINUED] hydrOXYzine (ATARAX/VISTARIL) 25 MG tablet Take 1 tablet (25 mg total) by mouth 3 (three) times daily as needed.  . [DISCONTINUED] ketoconazole (NIZORAL) 2 % cream Apply topically daily.  . [DISCONTINUED] predniSONE (DELTASONE) 50  MG tablet 1 tab daily x 5 days    Review of Systems  Constitutional: Negative.   HENT: Positive for congestion.   Eyes: Negative.   Respiratory: Positive for cough.   Cardiovascular: Negative.   Gastrointestinal: Negative.   Endocrine: Negative.   Genitourinary: Negative.   Musculoskeletal: Negative.   Skin: Negative.   Allergic/Immunologic: Negative.   Neurological: Negative.   Hematological: Negative.   Psychiatric/Behavioral: Negative.        Objective:   Physical Exam  Nursing note and vitals reviewed. Constitutional: She is oriented to person, place, and time. She appears well-developed and well-nourished. No distress.  HENT:  Head: Normocephalic and atraumatic.  Left Ear: External ear normal.  Mouth/Throat: Oropharynx is clear and moist.  Patient has cerumen in both ears right is worse than the left. There is some mild nasal congestion and turbinate redness bilaterally.  Eyes: Conjunctivae and EOM are normal. Pupils are equal, round, and reactive to light. Right eye exhibits no discharge. Left eye exhibits no discharge. No scleral icterus.  Neck: Normal range of motion. Neck supple. No thyromegaly present.  Cardiovascular: Normal rate, regular rhythm, normal heart sounds and intact distal pulses.  Exam reveals no gallop and no friction rub.   No murmur heard. At 60 per minute  Pulmonary/Chest: Effort normal and breath sounds normal. No respiratory distress. She has  no wheezes. She has no rales. She exhibits no tenderness.  The patient had a dry cough  Abdominal: Soft. Bowel sounds are normal. She exhibits no mass. There is no tenderness. There is no rebound and no guarding.  Musculoskeletal: Normal range of motion. She exhibits no edema and no tenderness.  Lymphadenopathy:    She has no cervical adenopathy.  Neurological: She is alert and oriented to person, place, and time. She has normal reflexes. No cranial nerve deficit.  Skin: Skin is warm and dry.  Psychiatric:  She has a normal mood and affect. Her behavior is normal. Judgment and thought content normal.   BP 113/65  Pulse 63  Temp(Src) 98.4 F (36.9 C) (Oral)  Ht 5' (1.524 m)  Wt 142 lb (64.411 kg)  BMI 27.73 kg/m2        Assessment & Plan:   1. Hypertension   2. Dyslipidemia   3. Osteoporosis   4. Carotid artery disease   5. Gastroesophageal reflux disease with hiatal hernia   6. Cough   7. Vitamin D deficiency    Orders Placed This Encounter  Procedures  . BMP8+EGFR    Standing Status: Future     Number of Occurrences:      Standing Expiration Date: 07/21/2014  . Hepatic function panel    Standing Status: Future     Number of Occurrences:      Standing Expiration Date: 07/21/2014  . NMR, lipoprofile    Standing Status: Future     Number of Occurrences:      Standing Expiration Date: 07/21/2014  . Vit D  25 hydroxy (rtn osteoporosis monitoring)    Standing Status: Future     Number of Occurrences:      Standing Expiration Date: 07/21/2014  . POCT CBC    Standing Status: Future     Number of Occurrences:      Standing Expiration Date: 08/21/2013   No orders of the defined types were placed in this encounter.   Patient Instructions  Continue current medications. Continue good therapeutic lifestyle changes which include good diet and exercise. Fall precautions discussed with patient. Schedule your flu vaccine if you haven't had it yet If you are over 65 years old - you may need Prevnar 13 or the adult Pneumonia vaccine. Use a cool mist humidifier in her bedroom at nighttime Drink more fluids Keep the house cooler Take Mucinex, blue and white in color, over-the-counter, one twice daily with a large glass of water   Nyra Capes MD

## 2013-07-21 NOTE — Patient Instructions (Addendum)
Continue current medications. Continue good therapeutic lifestyle changes which include good diet and exercise. Fall precautions discussed with patient. Schedule your flu vaccine if you haven't had it yet If you are over 77 years old - you may need Prevnar 13 or the adult Pneumonia vaccine. Use a cool mist humidifier in her bedroom at nighttime Drink more fluids Keep the house cooler Take Mucinex, blue and white in color, over-the-counter, one twice daily with a large glass of water

## 2013-07-23 DIAGNOSIS — D485 Neoplasm of uncertain behavior of skin: Secondary | ICD-10-CM | POA: Diagnosis not present

## 2013-07-23 DIAGNOSIS — L570 Actinic keratosis: Secondary | ICD-10-CM | POA: Diagnosis not present

## 2013-07-23 DIAGNOSIS — L259 Unspecified contact dermatitis, unspecified cause: Secondary | ICD-10-CM | POA: Diagnosis not present

## 2013-07-23 DIAGNOSIS — L821 Other seborrheic keratosis: Secondary | ICD-10-CM | POA: Diagnosis not present

## 2013-07-23 DIAGNOSIS — L909 Atrophic disorder of skin, unspecified: Secondary | ICD-10-CM | POA: Diagnosis not present

## 2013-07-29 ENCOUNTER — Other Ambulatory Visit (INDEPENDENT_AMBULATORY_CARE_PROVIDER_SITE_OTHER): Payer: Medicare Other

## 2013-07-29 DIAGNOSIS — E785 Hyperlipidemia, unspecified: Secondary | ICD-10-CM | POA: Diagnosis not present

## 2013-07-29 DIAGNOSIS — M810 Age-related osteoporosis without current pathological fracture: Secondary | ICD-10-CM

## 2013-07-29 DIAGNOSIS — R059 Cough, unspecified: Secondary | ICD-10-CM

## 2013-07-29 DIAGNOSIS — R05 Cough: Secondary | ICD-10-CM | POA: Diagnosis not present

## 2013-07-29 DIAGNOSIS — I10 Essential (primary) hypertension: Secondary | ICD-10-CM

## 2013-07-29 DIAGNOSIS — E559 Vitamin D deficiency, unspecified: Secondary | ICD-10-CM | POA: Diagnosis not present

## 2013-07-29 LAB — POCT CBC
Granulocyte percent: 68.6 %G (ref 37–80)
HCT, POC: 39.8 % (ref 37.7–47.9)
Lymph, poc: 1.3 (ref 0.6–3.4)
MCH, POC: 31 pg (ref 27–31.2)
MCV: 92.7 fL (ref 80–97)
RBC: 4.3 M/uL (ref 4.04–5.48)
WBC: 4.7 10*3/uL (ref 4.6–10.2)

## 2013-07-29 NOTE — Progress Notes (Signed)
Patient came in for labs only.

## 2013-07-30 ENCOUNTER — Ambulatory Visit (INDEPENDENT_AMBULATORY_CARE_PROVIDER_SITE_OTHER): Payer: Medicare Other | Admitting: *Deleted

## 2013-07-30 DIAGNOSIS — Z23 Encounter for immunization: Secondary | ICD-10-CM

## 2013-07-31 LAB — NMR, LIPOPROFILE
HDL Particle Number: 34 umol/L (ref >=30.5)
LDL Size: 19.9 nm — ABNORMAL LOW (ref >20.5)
LP-IR Score: 86 — ABNORMAL HIGH (ref <=45)
Small LDL Particle Number: 1311 nmol/L — ABNORMAL HIGH (ref <=527)

## 2013-07-31 LAB — HEPATIC FUNCTION PANEL
ALT: 29 IU/L (ref 0–32)
AST: 28 IU/L (ref 0–40)
Albumin: 4.7 g/dL (ref 3.5–4.8)
Alkaline Phosphatase: 72 IU/L (ref 39–117)
Bilirubin, Direct: 0.11 mg/dL (ref 0.00–0.40)
Total Bilirubin: 0.3 mg/dL (ref 0.0–1.2)

## 2013-07-31 LAB — BMP8+EGFR
BUN/Creatinine Ratio: 21 (ref 11–26)
BUN: 11 mg/dL (ref 8–27)
Calcium: 10.1 mg/dL (ref 8.6–10.2)
Creatinine, Ser: 0.52 mg/dL — ABNORMAL LOW (ref 0.57–1.00)
GFR calc Af Amer: 107 mL/min/{1.73_m2} (ref >59)
Sodium: 139 mmol/L (ref 134–144)

## 2013-07-31 LAB — VITAMIN D 25 HYDROXY (VIT D DEFICIENCY, FRACTURES): Vit D, 25-Hydroxy: 49.5 ng/mL (ref 30.0–100.0)

## 2013-08-18 ENCOUNTER — Encounter: Payer: Self-pay | Admitting: *Deleted

## 2013-08-21 ENCOUNTER — Ambulatory Visit (INDEPENDENT_AMBULATORY_CARE_PROVIDER_SITE_OTHER): Payer: Medicare Other | Admitting: Family Medicine

## 2013-08-21 ENCOUNTER — Encounter: Payer: Self-pay | Admitting: Family Medicine

## 2013-08-21 ENCOUNTER — Ambulatory Visit (INDEPENDENT_AMBULATORY_CARE_PROVIDER_SITE_OTHER): Payer: Medicare Other

## 2013-08-21 VITALS — BP 123/68 | HR 66 | Temp 98.0°F | Ht 60.0 in | Wt 145.0 lb

## 2013-08-21 DIAGNOSIS — M25559 Pain in unspecified hip: Secondary | ICD-10-CM | POA: Diagnosis not present

## 2013-08-21 DIAGNOSIS — M25551 Pain in right hip: Secondary | ICD-10-CM

## 2013-08-21 DIAGNOSIS — M1990 Unspecified osteoarthritis, unspecified site: Secondary | ICD-10-CM

## 2013-08-21 DIAGNOSIS — M5441 Lumbago with sciatica, right side: Secondary | ICD-10-CM

## 2013-08-21 DIAGNOSIS — M5430 Sciatica, unspecified side: Secondary | ICD-10-CM | POA: Diagnosis not present

## 2013-08-21 IMAGING — CR DG LUMBAR SPINE 2-3V
2 series · 2 of 2 positions shown · non-contrast
Comparison: DG DEXA AXIAL SKELETON - NRPT [facility] dated [DATE]

CLINICAL DATA: Right hip pain, low back pain

EXAM:
LUMBAR SPINE - 2-3 VIEW

[view not recorded (1 of 2)]
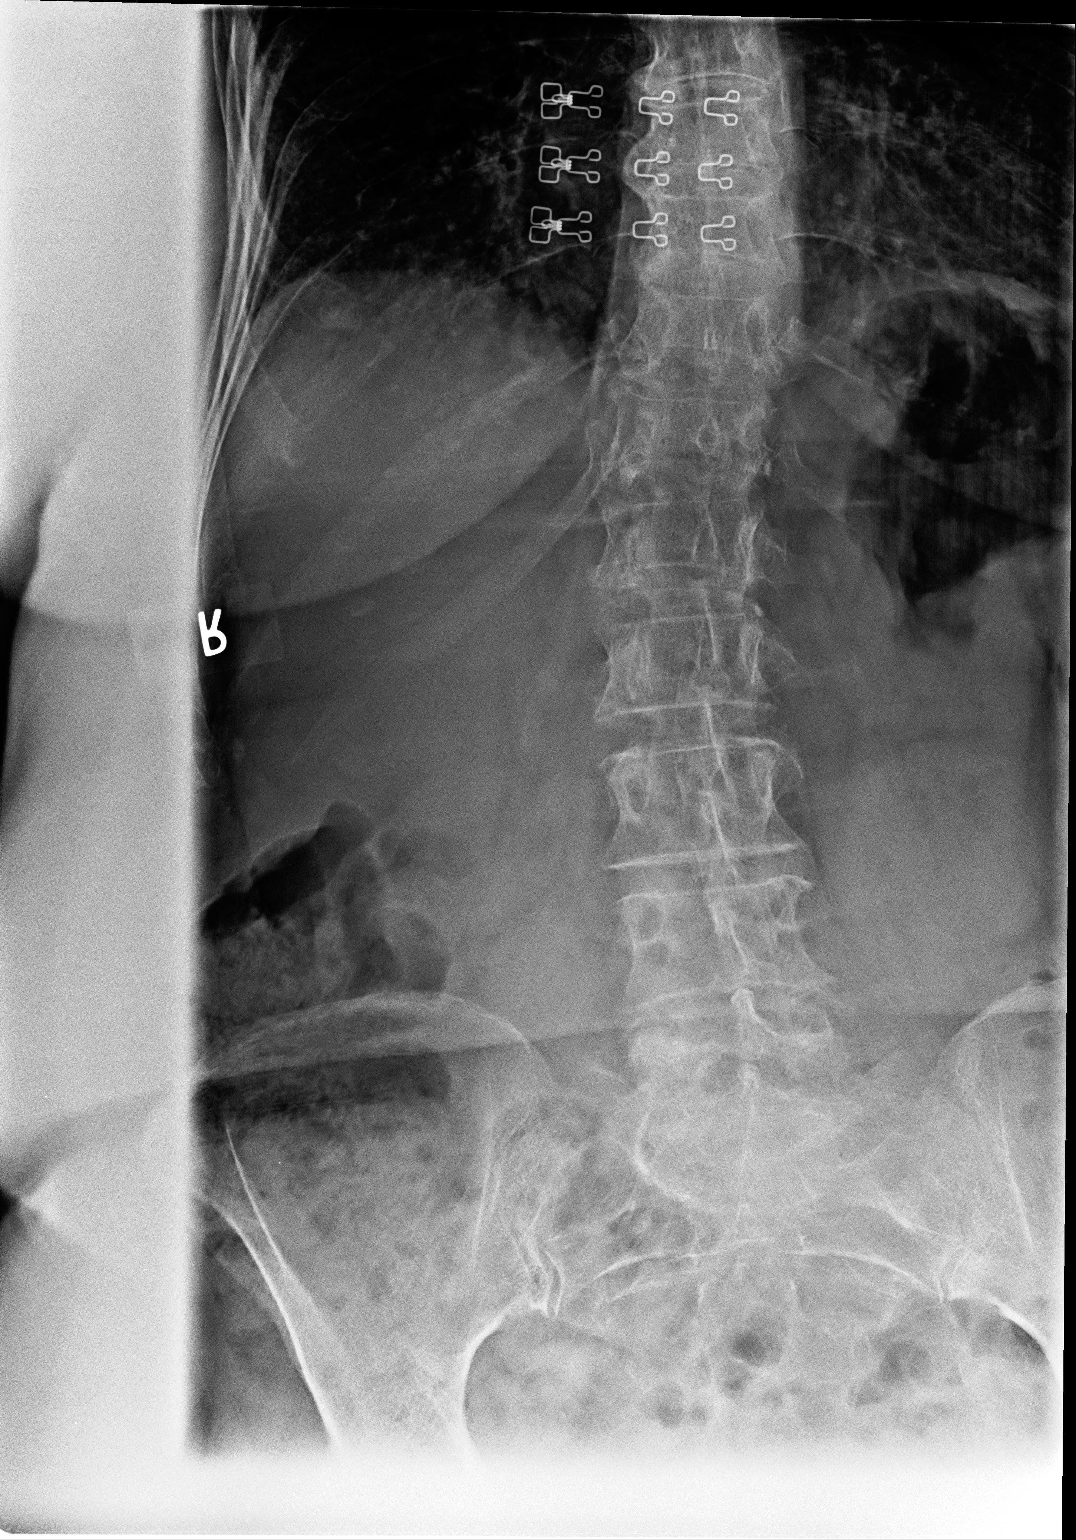

[view not recorded (2 of 2)]
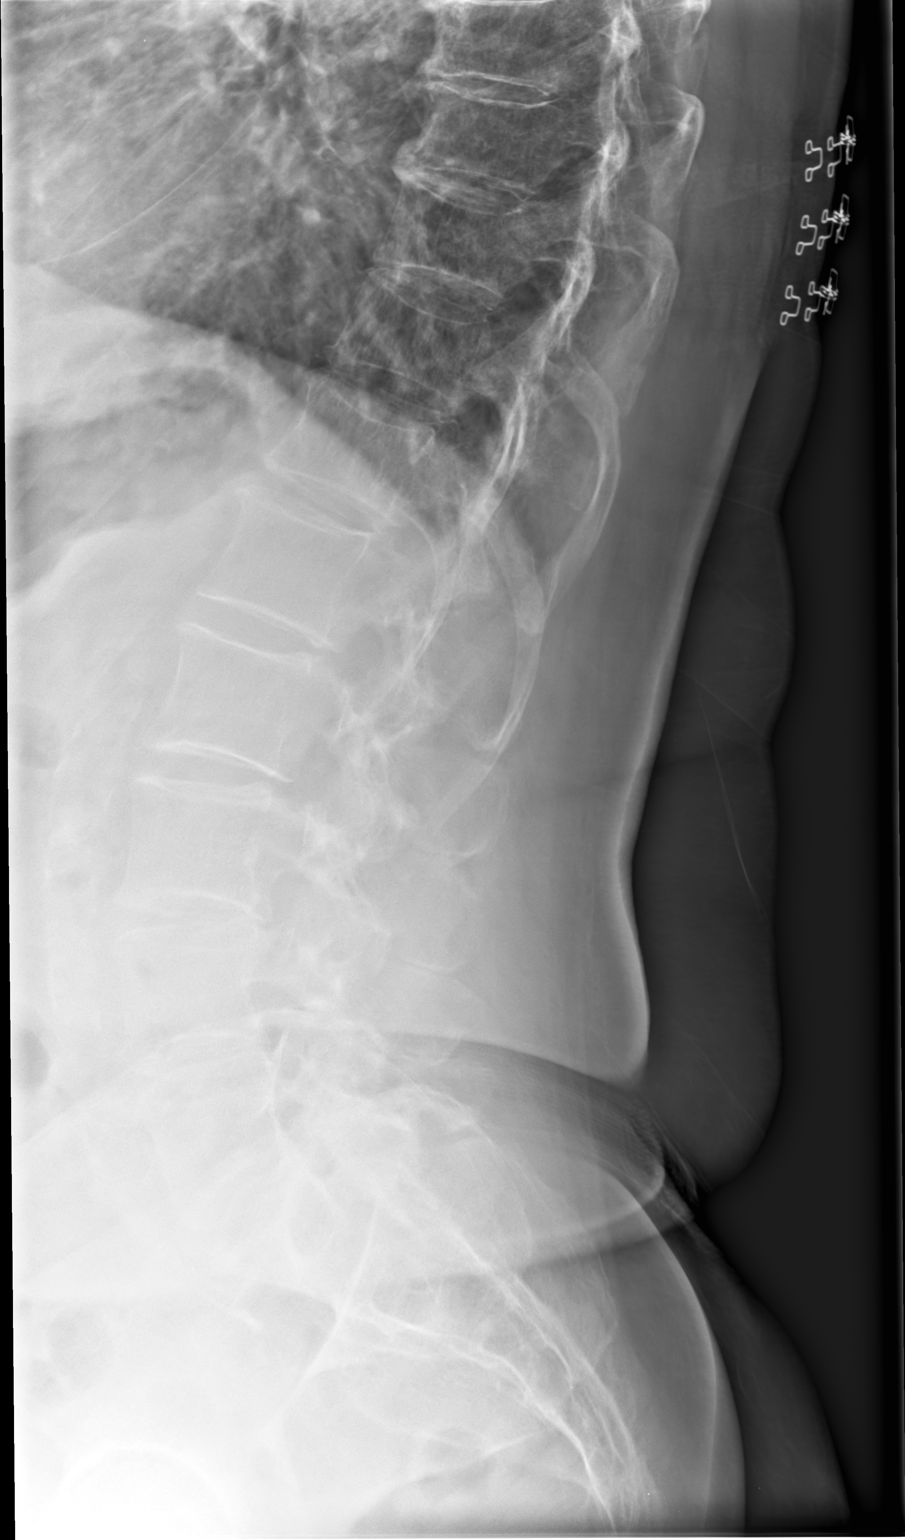

[2 of 2 positions shown; findings below may reference images not displayed]

FINDINGS: There are 5 nonrib bearing lumbar-type vertebral bodies. The
vertebral body heights are maintained. The alignment is anatomic.
There is no spondylolysis. There is no acute fracture or static
listhesis. There is degenerative disc disease at L3-4 and L4-5.

The SI joints are unremarkable.

There is abdominal aortic atherosclerosis.
IMPRESSION: No acute osseous injury of the lumbar spine. Lumbar spine
spondylosis at L4-5 and L5-S1.

## 2013-08-21 IMAGING — CR DG HIP COMPLETE 2+V*R*
2 series · 2 of 2 positions shown · non-contrast
Comparison: None.

CLINICAL DATA: Right hip pain.

EXAM:
RIGHT HIP - COMPLETE 2+ VIEW

[view not recorded (1 of 2)]
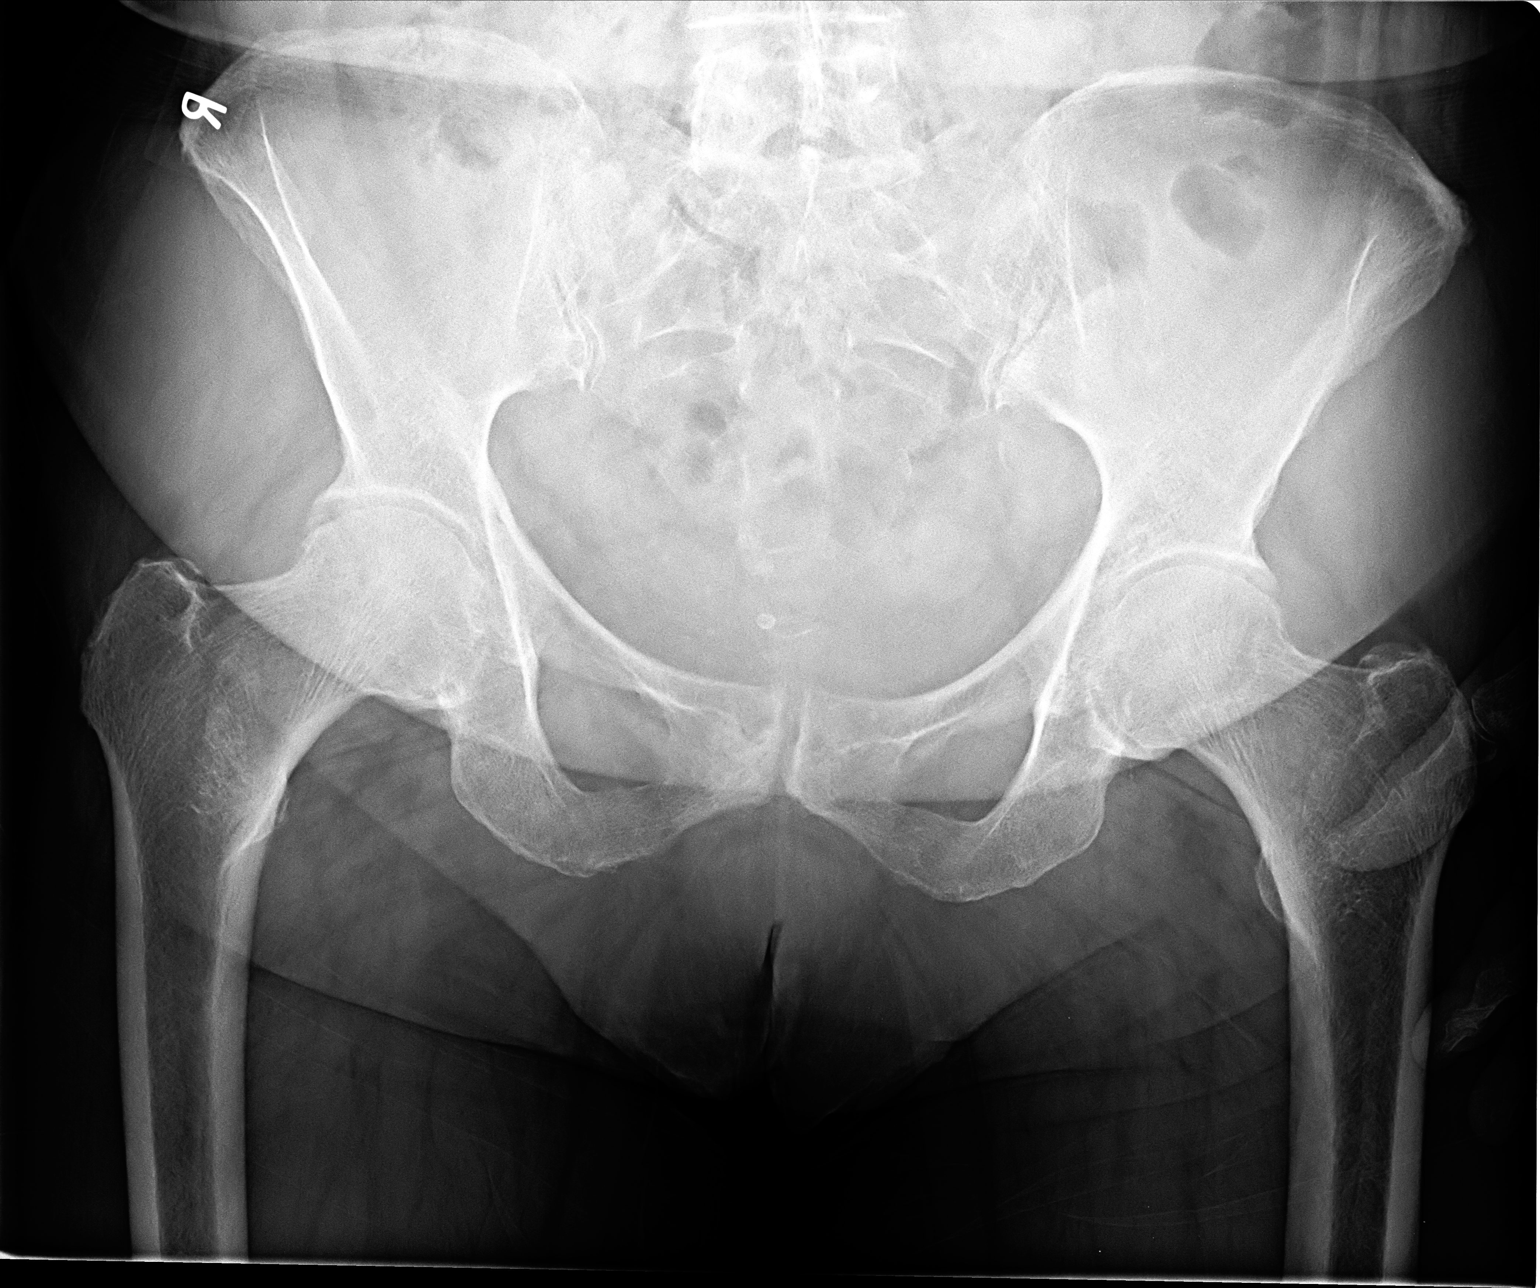

[view not recorded (2 of 2)]
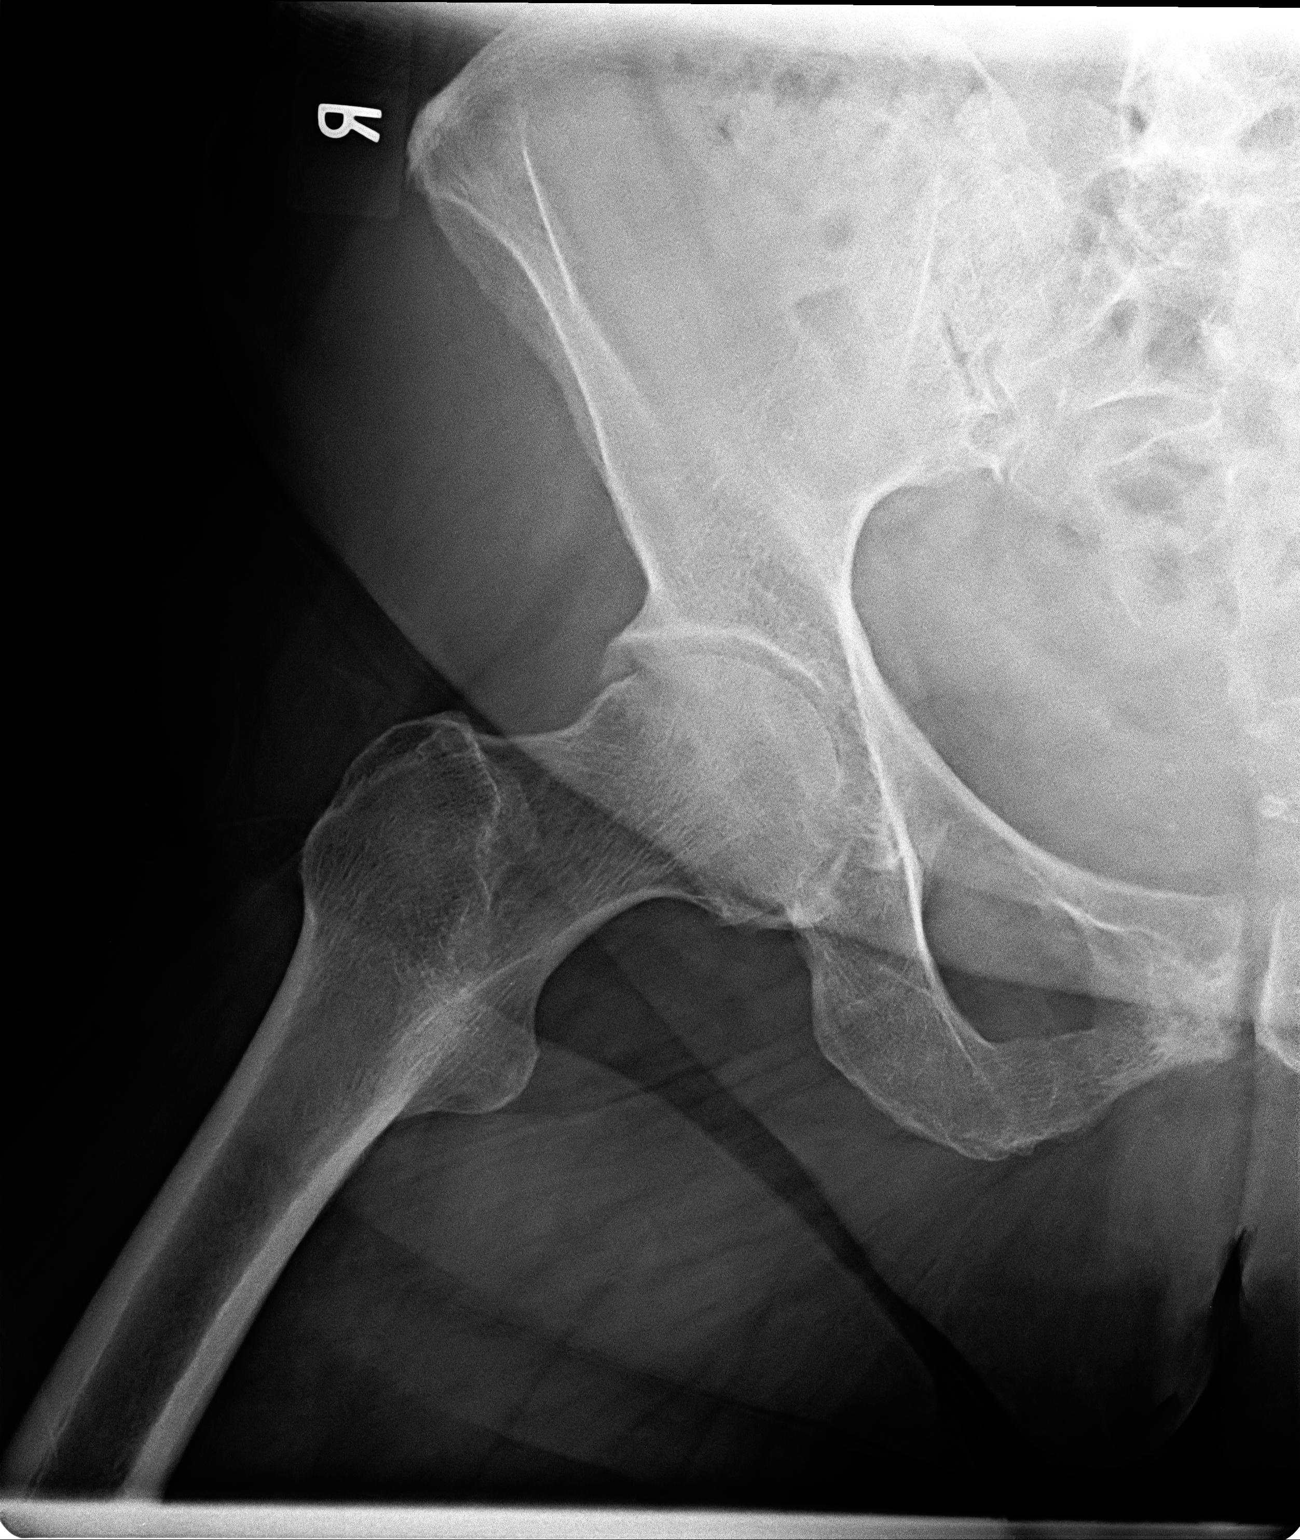

[2 of 2 positions shown; findings below may reference images not displayed]

FINDINGS: No fracture or dislocation is noted. Mild narrowing of the right hip
joint is noted consistent with degenerative joint disease.
Sacroiliac joints appear normal.
IMPRESSION: Mild degenerative joint disease of the right hip.

## 2013-08-21 MED ORDER — MELOXICAM 7.5 MG PO TABS: 7.5000 mg | ORAL_TABLET | Freq: Every day | ORAL | Status: DC

## 2013-08-21 NOTE — Patient Instructions (Signed)
Warm compresses to low back and right hip 20 minutes 4 times daily Take medication as directed after breakfast Continue to take proton X. at least one half an hour before breakfast Take a Zantac over-the-counter 75 mg before dinner every night If the prescribed medicine irritate her stomach causes swelling in your feet for increased visual blood pressure stop taking it If the prescribed medicine does not help the pain after a couple weeks and he did not start feeling better we may need to get an MRI of your LS spine and right hip

## 2013-08-21 NOTE — Progress Notes (Addendum)
Subjective:    Patient ID: Melissa Carpenter, female    DOB: [birth year], 80 y.o.   MRN: [id number]  HPI Patient here today for right side hip pain that is worsening since before christmas. Patient did not fall she just stumbled and the hip pain has been there since that time there      Patient Active Problem List   Diagnosis Date Noted  . Gastroesophageal reflux disease with hiatal hernia 07/21/2013  . Osteoporosis 03/05/2013  . Tachycardia   . Edema   . Shortness of breath   . Carotid artery disease   . Dyslipidemia   . Mitral valve prolapse   . Ejection fraction   . Aortic insufficiency   . Hypertension   . Chest pain    Outpatient Encounter Prescriptions as of 08/21/2013  Medication Sig  . ACTONEL 35 MG tablet TAKE 1 TABLET ON EMPTY STOMACH WITH FULL GLASS OF WATER DON'T LIE DOWN, DRINK OR EAT FOR 30 MINS  . amlodipine-atorvastatin (CADUET) 10-20 MG per tablet TAKE 1 TABLET AT BEDTIME  . aspirin EC 81 MG tablet Take 81 mg by mouth daily.  Marland Kitchen BENICAR 40 MG tablet TAKE 1 TABLET EVERY DAY  . Calcium Carbonate-Vitamin D (CALTRATE 600+D) 600-400 MG-UNIT per tablet Take 1 tablet by mouth daily.  . cholecalciferol (VITAMIN D) 400 UNITS TABS Take 1,000 Units by mouth.    . desonide (DESOWEN) 0.05 % cream   . fish oil-omega-3 fatty acids 1000 MG capsule Take 1 g by mouth daily.    . hydrocortisone 2.5 % cream   . metoprolol succinate (TOPROL-XL) 100 MG 24 hr tablet TAKE ONE TABLET BY MOUTH EVERY DAY  . Multiple Vitamin (MULTIVITAMIN) tablet Take 1 tablet by mouth daily.    . pantoprazole (PROTONIX) 40 MG tablet TAKE 1 TABLET EVERY DAY    Review of Systems  Musculoskeletal: Positive for arthralgias (right side hip pain).       Objective:   Physical Exam  Nursing note and vitals reviewed. Constitutional: She is oriented to person, place, and time. She appears well-developed and well-nourished. She appears distressed.  Eyes: Conjunctivae and EOM are normal. Pupils are equal, round,  and reactive to light. Right eye exhibits no discharge. Left eye exhibits no discharge. No scleral icterus.  Neck: Normal range of motion.  Pulmonary/Chest: Breath sounds normal.  Abdominal: Bowel sounds are normal.  Musculoskeletal: Normal range of motion. She exhibits tenderness (low back is tender to palpation). She exhibits no edema.  With leg raising and hip abduction on the right there is pain in the right hip and right low back area  Neurological: She is alert and oriented to person, place, and time. She has normal reflexes. She displays normal reflexes.  Skin: Skin is warm and dry. No rash noted.  Psychiatric: She has a normal mood and affect. Her behavior is normal. Judgment and thought content normal.   BP 123/68  Pulse 66  Temp(Src) 98 F (36.7 C) (Oral)  Ht 5' (1.524 m)  Wt 145 lb (65.772 kg)  BMI 28.32 kg/m2  [facility] reading (PRIMARY) by  DrMoore--LS-spine-disc space narrowing L5-S1 and scoliosis and degenerative changes Right hip-degenerative changes                                        Assessment & Plan:  1. Right hip pain - DG Hip Complete Right; Future - meloxicam (MOBIC) 7.5  MG tablet; Take 1 tablet (7.5 mg total) by mouth daily.  Dispense: 30 tablet; Refill: 0 - DG Lumbar Spine 2-3 Views; Future  2. Low back pain on right side with sciatica - meloxicam (MOBIC) 7.5 MG tablet; Take 1 tablet (7.5 mg total) by mouth daily.  Dispense: 30 tablet; Refill: 0 - DG Lumbar Spine 2-3 Views; Future  3. Degenerative arthritis - meloxicam (MOBIC) 7.5 MG tablet; Take 1 tablet (7.5 mg total) by mouth daily.  Dispense: 30 tablet; Refill: 0 - DG Lumbar Spine 2-3 Views; Future Patient Instructions  Warm compresses to low back and right hip 20 minutes 4 times daily Take medication as directed after breakfast Continue to take proton X. at least one half an hour before breakfast Take a Zantac over-the-counter 75 mg before dinner every night If the prescribed medicine irritate  her stomach causes swelling in your feet for increased visual blood pressure stop taking it If the prescribed medicine does not help the pain after a couple weeks and he did not start feeling better we may need to get an MRI of your LS spine and right hip   Arrie Senate MD

## 2013-08-22 ENCOUNTER — Telehealth: Payer: Self-pay

## 2013-08-22 NOTE — Telephone Encounter (Signed)
[facility]

## 2013-09-04 ENCOUNTER — Encounter: Payer: Self-pay | Admitting: Family Medicine

## 2013-09-04 ENCOUNTER — Ambulatory Visit (INDEPENDENT_AMBULATORY_CARE_PROVIDER_SITE_OTHER): Payer: Medicare Other | Admitting: Family Medicine

## 2013-09-04 VITALS — BP 134/71 | HR 64 | Temp 97.6°F | Ht 60.0 in | Wt 147.0 lb

## 2013-09-04 DIAGNOSIS — M4716 Other spondylosis with myelopathy, lumbar region: Secondary | ICD-10-CM | POA: Diagnosis not present

## 2013-09-04 DIAGNOSIS — M1990 Unspecified osteoarthritis, unspecified site: Secondary | ICD-10-CM

## 2013-09-04 DIAGNOSIS — M25559 Pain in unspecified hip: Secondary | ICD-10-CM

## 2013-09-04 DIAGNOSIS — M25551 Pain in right hip: Secondary | ICD-10-CM

## 2013-09-04 NOTE — Patient Instructions (Signed)
Continue to taper off the meloxicam and use Tylenol instead Use warm compresses as needed Avoid heavy lifting pushing pulling or straining.

## 2013-09-04 NOTE — Progress Notes (Signed)
Subjective:    Patient ID: Melissa Carpenter, female    DOB: 1934/03/06, 80 y.o.   MRN: [id number]  HPI Patient here for follow up from low back pain-right side. Patient said the pain from the past visit is much improved. We also discussed her mother-in-law today to is in the nursing home and has been in hospital recently. 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 Gastroesophageal reflux disease with hiatal hernia 07/21/2013  . Osteoporosis 03/05/2013  . Tachycardia   . Edema   . Shortness of breath   . Carotid artery disease   . Dyslipidemia   . Mitral valve prolapse   . Ejection fraction   . Aortic insufficiency   . Hypertension   . Chest pain    Outpatient Encounter Prescriptions as of 09/04/2013  Medication Sig  . ACTONEL 35 MG tablet TAKE 1 TABLET ON EMPTY STOMACH WITH FULL GLASS OF WATER DON'T LIE DOWN, DRINK OR EAT FOR 30 MINS  . amlodipine-atorvastatin (CADUET) 10-20 MG per tablet TAKE 1 TABLET AT BEDTIME  . aspirin EC 81 MG tablet Take 81 mg by mouth daily.  Marland Kitchen BENICAR 40 MG tablet TAKE 1 TABLET EVERY DAY  . Calcium Carbonate-Vitamin D (CALTRATE 600+D) 600-400 MG-UNIT per tablet Take 1 tablet by mouth daily.  . cholecalciferol (VITAMIN D) 400 UNITS TABS Take 1,000 Units by mouth.    . desonide (DESOWEN) 0.05 % cream   . fish oil-omega-3 fatty acids 1000 MG capsule Take 1 g by mouth daily.    . hydrocortisone 2.5 % cream   . meloxicam (MOBIC) 7.5 MG tablet Take 1 tablet (7.5 mg total) by mouth daily.  . metoprolol succinate (TOPROL-XL) 100 MG 24 hr tablet TAKE ONE TABLET BY MOUTH EVERY DAY  . Multiple Vitamin (MULTIVITAMIN) tablet Take 1 tablet by mouth daily.    . pantoprazole (PROTONIX) 40 MG tablet TAKE 1 TABLET EVERY DAY    Review of Systems  Constitutional: Negative.   HENT: Negative.   Eyes: Negative.   Respiratory: Negative.   Cardiovascular: Negative.   Gastrointestinal: Negative.     Endocrine: Negative.   Genitourinary: Negative.   Musculoskeletal: Positive for arthralgias (low back pain- much better).  Skin: Negative.   Allergic/Immunologic: Negative.   Neurological: Negative.   Hematological: Negative.   Psychiatric/Behavioral: Negative.        Objective:   Physical Exam  Nursing note and vitals reviewed. Constitutional: She is oriented to person, place, and time. She appears well-developed and well-nourished. No distress.  The most recent x-rays were reviewed with the patient with an anatomical model and she understood to call us of the pain she was having in her back and right hip  HENT:  Head: Normocephalic.  Eyes: Conjunctivae and EOM are normal. Pupils are equal, round, and reactive to light.  Neck: Normal range of motion.  Musculoskeletal: Normal range of motion. She exhibits no edema and no tenderness.  Good leg raising and hip abduction bilaterally movement of the right leg and abduction was more painful than the left   Neurological: She is alert and oriented to person, place, and time. She has normal reflexes.  Skin: Skin is warm and dry. No rash noted.  Psychiatric: She has a normal mood and affect. Her behavior is normal. Judgment and thought content normal.   BP 134/71  Pulse 64  Temp(Src) 97.6 F (36.4 C) (Oral)  Ht 5' (  1.524 m)  Wt 147 lb (66.679 kg)  BMI 28.71 kg/m2        Assessment & Plan:  1. Right hip pain  2. Degenerative arthritis  3. Lumbar spondylosis with myelopathy Patient Instructions  Continue to taper off the meloxicam and use Tylenol instead Use warm compresses as needed Avoid heavy lifting pushing pulling or straining.   Arrie Senate MD

## 2013-09-19 DIAGNOSIS — M6530 Trigger finger, unspecified finger: Secondary | ICD-10-CM | POA: Diagnosis not present

## 2013-09-23 ENCOUNTER — Other Ambulatory Visit: Payer: Self-pay | Admitting: Family Medicine

## 2013-09-27 ENCOUNTER — Other Ambulatory Visit: Payer: Self-pay | Admitting: Family Medicine

## 2013-09-30 ENCOUNTER — Other Ambulatory Visit: Payer: Self-pay | Admitting: *Deleted

## 2013-09-30 MED ORDER — AMLODIPINE-ATORVASTATIN 10-20 MG PO TABS: 1.0000 | ORAL_TABLET | Freq: Every day | ORAL | Status: DC

## 2013-10-02 ENCOUNTER — Other Ambulatory Visit: Payer: Self-pay

## 2013-10-02 MED ORDER — METOPROLOL SUCCINATE ER 100 MG PO TB24: 100.0000 mg | ORAL_TABLET | Freq: Every day | ORAL | Status: DC

## 2013-10-15 ENCOUNTER — Other Ambulatory Visit: Payer: Self-pay | Admitting: *Deleted

## 2013-10-15 MED ORDER — AZITHROMYCIN 250 MG PO TABS: ORAL_TABLET | ORAL | Status: DC

## 2013-10-17 ENCOUNTER — Ambulatory Visit (INDEPENDENT_AMBULATORY_CARE_PROVIDER_SITE_OTHER): Payer: Medicare Other | Admitting: Cardiology

## 2013-10-17 ENCOUNTER — Encounter: Payer: Self-pay | Admitting: Cardiology

## 2013-10-17 VITALS — BP 116/62 | HR 65 | Ht 63.0 in | Wt 144.1 lb

## 2013-10-17 DIAGNOSIS — R000 Tachycardia, unspecified: Secondary | ICD-10-CM | POA: Diagnosis not present

## 2013-10-17 DIAGNOSIS — I739 Peripheral vascular disease, unspecified: Secondary | ICD-10-CM

## 2013-10-17 DIAGNOSIS — I059 Rheumatic mitral valve disease, unspecified: Secondary | ICD-10-CM

## 2013-10-17 DIAGNOSIS — I779 Disorder of arteries and arterioles, unspecified: Secondary | ICD-10-CM

## 2013-10-17 DIAGNOSIS — R0602 Shortness of breath: Secondary | ICD-10-CM | POA: Diagnosis not present

## 2013-10-17 DIAGNOSIS — I341 Nonrheumatic mitral (valve) prolapse: Secondary | ICD-10-CM

## 2013-10-17 NOTE — Patient Instructions (Signed)
Your physician has requested that you have a carotid duplex. This test is an ultrasound of the carotid arteries in your neck. It looks at blood flow through these arteries that supply the brain with blood. Allow one hour for this exam. There are no restrictions or special instructions.  Your physician wants you to follow-up in: 1 year.  You will receive a reminder letter in the mail two months in advance. If you don't receive a letter, please call our office to schedule the follow-up appointment.  

## 2013-10-17 NOTE — Assessment & Plan Note (Signed)
She has not been bothered by any recurrent nighttime tachycardia. She is on a beta blocker that helps with this. No further workup.

## 2013-10-17 NOTE — Assessment & Plan Note (Signed)
Historically she has only trivial mitral valve disease. No further workup is needed. We will consider followup echo next year.

## 2013-10-17 NOTE — Assessment & Plan Note (Signed)
Her Doppler in February, 2014 revealed some progression of a right carotid. It was felt that followup could be done in one year. This will be arranged.

## 2013-10-17 NOTE — Progress Notes (Signed)
HPI  Patient is seen today to followup some shortness of breath and a history of mild sinus tachycardia. I saw her last a year ago. She is stable. She is not having any particular complaints at this time. She's not having any significant shortness of breath or sensation of tachycardia.  Allergies  Allergen Reactions  . Codeine   . Morphine   . Penicillins   . Sulfonamide Derivatives   . Sulfites Rash    Current Outpatient Prescriptions  Medication Sig Dispense Refill  . ACTONEL 35 MG tablet TAKE 1 TABLET ON EMPTY STOMACH WITH FULL GLASS OF WATER DON'T LIE DOWN, DRINK OR EAT FOR 30 MINS  12 tablet  1  . amlodipine-atorvastatin (CADUET) 10-20 MG per tablet Take 1 tablet by mouth daily.  90 tablet  1  . aspirin EC 81 MG tablet Take 81 mg by mouth daily.      Marland Kitchen BENICAR 40 MG tablet TAKE 1 TABLET EVERY DAY  90 tablet  1  . Calcium Carbonate-Vitamin D (CALTRATE 600+D) 600-400 MG-UNIT per tablet Take 1 tablet by mouth daily.      . cholecalciferol (VITAMIN D) 400 UNITS TABS Take 1,000 Units by mouth.        . fish oil-omega-3 fatty acids 1000 MG capsule Take 1 g by mouth daily.        . metoprolol succinate (TOPROL-XL) 100 MG 24 hr tablet Take 1 tablet (100 mg total) by mouth daily. Take with or immediately following a meal.  90 tablet  1  . Multiple Vitamin (MULTIVITAMIN) tablet Take 1 tablet by mouth daily.        . pantoprazole (PROTONIX) 40 MG tablet TAKE 1 TABLET EVERY DAY  90 tablet  1   No current facility-administered medications for this visit.    History   Social History  . Marital Status: Married    Spouse Name: N/A    Number of Children: N/A  . Years of Education: N/A   Occupational History  . Not on file.   Social History Main Topics  . Smoking status: Never Smoker   . Smokeless tobacco: Never Used  . Alcohol Use: No  . Drug Use: No  . Sexual Activity: Not on file   Other Topics Concern  . Not on file   Social History Narrative  . No narrative on file     Family History  Problem Relation Age of Onset  . Heart attack Mother   . Hypertension Father   . Bone cancer Sister   . Lung cancer Grandchild     Past Medical History  Diagnosis Date  . Shortness of breath   . Carotid artery disease     , Doppler, 2003  . Dyslipidemia   . Mitral valve prolapse     Echo, 2009, very mild intermittent prolapse of the posterior leaflet, no MR  . Ejection fraction     EF 60-70%, echo, December, 2009 / EF 55-60%, December, 2012  . Aortic insufficiency     Mild, echo, December, 2009  . Hypertension   . DJD (degenerative joint disease)   . Chest pain     Catheterization, 2003, no significant CAD  . Edema     November, 2012  . Diastolic dysfunction     Mild diastolic dysfunction, echo, December, 2012  . Tachycardia     Nighttime tachycardia, February, 2014    Past Surgical History  Procedure Laterality Date  . Cardiac catheterization  sept 2003  no significant cad  . Appendectomy    . Tonsillectomy    . Hysterectomy--unknown type      Patient Active Problem List   Diagnosis Date Noted  . Gastroesophageal reflux disease with hiatal hernia 07/21/2013  . Osteoporosis 03/05/2013  . Tachycardia   . Edema   . Shortness of breath   . Carotid artery disease   . Dyslipidemia   . Mitral valve prolapse   . Ejection fraction   . Aortic insufficiency   . Hypertension   . Chest pain     ROS    Patient denies fever, chills, headache, sweats, rash, change in vision, change in hearing, chest pain, cough, nausea vomiting, urinary symptoms. All other systems are reviewed and are negative.  PHYSICAL EXAM  Patient is oriented to person time and place. Affect is normal. Her husband is in the room at the time of the evaluation. There is no jugulovenous distention. Lungs are clear. Respiratory effort is nonlabored. There is some kyphosis of the thoracic spine. Cardiac exam reveals S1 and S2. The abdomen is soft. There is no peripheral  edema.  Filed Vitals:   10/17/13 0826  BP: 116/62  Pulse: 65  Height: 5\' 3"  (1.6 m)  Weight: 144 lb 1.9 oz (65.372 kg)   EKG is done today and reviewed by me. The EKG is normal.  ASSESSMENT & PLAN

## 2013-10-17 NOTE — Assessment & Plan Note (Signed)
She's not having any marked shortness of breath. No further workup is needed.

## 2013-10-28 ENCOUNTER — Other Ambulatory Visit (INDEPENDENT_AMBULATORY_CARE_PROVIDER_SITE_OTHER): Payer: Medicare Other

## 2013-10-28 DIAGNOSIS — E785 Hyperlipidemia, unspecified: Secondary | ICD-10-CM

## 2013-10-28 DIAGNOSIS — E559 Vitamin D deficiency, unspecified: Secondary | ICD-10-CM

## 2013-10-28 DIAGNOSIS — I10 Essential (primary) hypertension: Secondary | ICD-10-CM | POA: Diagnosis not present

## 2013-10-28 LAB — POCT CBC
Granulocyte percent: 61 %G (ref 37–80)
HEMATOCRIT: 39 % (ref 37.7–47.9)
Hemoglobin: 12.7 g/dL (ref 12.2–16.2)
Lymph, poc: 1.5 (ref 0.6–3.4)
MCH: 29.9 pg (ref 27–31.2)
MCHC: 32.6 g/dL (ref 31.8–35.4)
MCV: 91.7 fL (ref 80–97)
MPV: 7.5 fL (ref 0–99.8)
POC Granulocyte: 3.2 (ref 2–6.9)
POC LYMPH %: 29.6 % (ref 10–50)
Platelet Count, POC: 258 10*3/uL (ref 142–424)
RBC: 4.3 M/uL (ref 4.04–5.48)
RDW, POC: 13.1 %
WBC: 5.2 10*3/uL (ref 4.6–10.2)

## 2013-10-28 NOTE — Progress Notes (Signed)
Pt came in for labs only 

## 2013-10-30 ENCOUNTER — Telehealth: Payer: Self-pay | Admitting: *Deleted

## 2013-10-30 ENCOUNTER — Ambulatory Visit (HOSPITAL_COMMUNITY): Payer: Medicare Other | Attending: Cardiology | Admitting: *Deleted

## 2013-10-30 DIAGNOSIS — I2510 Atherosclerotic heart disease of native coronary artery without angina pectoris: Secondary | ICD-10-CM | POA: Diagnosis not present

## 2013-10-30 DIAGNOSIS — I10 Essential (primary) hypertension: Secondary | ICD-10-CM | POA: Insufficient documentation

## 2013-10-30 DIAGNOSIS — I658 Occlusion and stenosis of other precerebral arteries: Secondary | ICD-10-CM | POA: Insufficient documentation

## 2013-10-30 DIAGNOSIS — I6529 Occlusion and stenosis of unspecified carotid artery: Secondary | ICD-10-CM | POA: Diagnosis not present

## 2013-10-30 DIAGNOSIS — I739 Peripheral vascular disease, unspecified: Secondary | ICD-10-CM

## 2013-10-30 DIAGNOSIS — E785 Hyperlipidemia, unspecified: Secondary | ICD-10-CM | POA: Insufficient documentation

## 2013-10-30 DIAGNOSIS — I779 Disorder of arteries and arterioles, unspecified: Secondary | ICD-10-CM

## 2013-10-30 LAB — BMP8+EGFR
BUN/Creatinine Ratio: 20 (ref 11–26)
BUN: 11 mg/dL (ref 8–27)
CO2: 23 mmol/L (ref 18–29)
Calcium: 9.8 mg/dL (ref 8.7–10.3)
Chloride: 99 mmol/L (ref 97–108)
Creatinine, Ser: 0.54 mg/dL — ABNORMAL LOW (ref 0.57–1.00)
GFR, EST AFRICAN AMERICAN: 105 mL/min/{1.73_m2} (ref >59)
GFR, EST NON AFRICAN AMERICAN: 91 mL/min/{1.73_m2} (ref >59)
Glucose: 107 mg/dL — ABNORMAL HIGH (ref 65–99)
POTASSIUM: 4.2 mmol/L (ref 3.5–5.2)
Sodium: 139 mmol/L (ref 134–144)

## 2013-10-30 LAB — HEPATIC FUNCTION PANEL
ALK PHOS: 72 IU/L (ref 39–117)
ALT: 36 IU/L — AB (ref 0–32)
AST: 31 IU/L (ref 0–40)
Albumin: 4.6 g/dL (ref 3.5–4.8)
BILIRUBIN DIRECT: 0.13 mg/dL (ref 0.00–0.40)
Total Bilirubin: 0.4 mg/dL (ref 0.0–1.2)
Total Protein: 6.9 g/dL (ref 6.0–8.5)

## 2013-10-30 LAB — NMR, LIPOPROFILE
Cholesterol: 165 mg/dL (ref <200)
HDL Cholesterol by NMR: 40 mg/dL (ref >=40)
HDL Particle Number: 31.3 umol/L (ref >=30.5)
LDL PARTICLE NUMBER: 1414 nmol/L — AB (ref <1000)
LDL SIZE: 20 nm — AB (ref >20.5)
LDLC SERPL CALC-MCNC: 67 mg/dL (ref <100)
LP-IR SCORE: 82 — AB (ref <=45)
Small LDL Particle Number: 934 nmol/L — ABNORMAL HIGH (ref <=527)
Triglycerides by NMR: 288 mg/dL — ABNORMAL HIGH (ref <150)

## 2013-10-30 LAB — VITAMIN D 25 HYDROXY (VIT D DEFICIENCY, FRACTURES): Vit D, 25-Hydroxy: 49 ng/mL (ref 30.0–100.0)

## 2013-10-30 NOTE — Progress Notes (Signed)
Carotid duplex complete 

## 2013-10-30 NOTE — Telephone Encounter (Signed)
Left message to return call about lab results.

## 2013-10-30 NOTE — Telephone Encounter (Signed)
Message copied by Ilean China on Thu Oct 30, 2013 11:30 AM ------      Message from: Chipper Herb      Created: Thu Oct 30, 2013  7:27 AM       The blood sugar slightly elevated at 107, the kidney function test and electrolytes are within normal limit      1 liver function tests are slightly elevated and this has been elevated in the past. The remainder of the liver function tests are within normal limits      On advanced lipid testing a total LDL [place] number is 1414. This is improved from what it was 3 months ago but still elevated at above the goal of 1000, and the triglycerides are also elevated.--- Continue with as aggressive therapeutic lifestyle changes as possible and continue current treatment.      The vitamin D level is good, continue current treatment ------

## 2013-10-31 ENCOUNTER — Encounter: Payer: Self-pay | Admitting: Cardiology

## 2013-11-10 ENCOUNTER — Encounter: Payer: Self-pay | Admitting: Family Medicine

## 2013-11-10 ENCOUNTER — Ambulatory Visit (INDEPENDENT_AMBULATORY_CARE_PROVIDER_SITE_OTHER): Payer: Medicare Other | Admitting: Family Medicine

## 2013-11-10 VITALS — BP 114/63 | HR 64 | Temp 98.2°F | Ht 63.0 in | Wt 146.0 lb

## 2013-11-10 DIAGNOSIS — I779 Disorder of arteries and arterioles, unspecified: Secondary | ICD-10-CM | POA: Diagnosis not present

## 2013-11-10 DIAGNOSIS — M810 Age-related osteoporosis without current pathological fracture: Secondary | ICD-10-CM

## 2013-11-10 DIAGNOSIS — I6529 Occlusion and stenosis of unspecified carotid artery: Secondary | ICD-10-CM | POA: Diagnosis not present

## 2013-11-10 DIAGNOSIS — E785 Hyperlipidemia, unspecified: Secondary | ICD-10-CM

## 2013-11-10 DIAGNOSIS — I10 Essential (primary) hypertension: Secondary | ICD-10-CM

## 2013-11-10 DIAGNOSIS — E8881 Metabolic syndrome: Secondary | ICD-10-CM

## 2013-11-10 DIAGNOSIS — I739 Peripheral vascular disease, unspecified: Secondary | ICD-10-CM

## 2013-11-10 NOTE — Patient Instructions (Addendum)
Medicare Annual Wellness Visit  [facility] and the medical providers at [facility] strive to bring you the best medical care.  In doing so we not only want to address your current medical conditions and concerns but also to detect new conditions early and prevent illness, disease and health-related problems.    Medicare offers a yearly Wellness Visit which allows our clinical staff to assess your need for preventative services including immunizations, lifestyle education, counseling to decrease risk of preventable diseases and screening for fall risk and other medical concerns.    This visit is provided free of charge (no copay) for all Medicare recipients. The clinical pharmacists at [facility] have begun to conduct these Wellness Visits which will also include a thorough review of all your medications.    As you primary medical provider recommend that you make an appointment for your Annual Wellness Visit if you have not done so already this year.  You may set up this appointment before you leave today or you may call back ([phone]) and schedule an appointment.  Please make sure when you call that you mention that you are scheduling your Annual Wellness Visit with the clinical pharmacist so that the appointment may be made for the proper length of time.     Continue current medications. Continue good therapeutic lifestyle changes which include good diet and exercise. Fall precautions discussed with patient. If an FOBT was given today- please return it to our front desk. If you are over 33 years old - you may need Prevnar 81 or the adult Pneumonia vaccine.  Mode are and stick with better diet habits. Drink more water Do not forget to schedule your mammogram.

## 2013-11-10 NOTE — Progress Notes (Signed)
Subjective:    Patient ID: Melissa Carpenter, female    DOB: 01/16/1934, 80 y.o.   MRN: [id number]  HPI Pt here for follow up and management of chronic medical problems. Recent labs were reviewed with the patient. And breast exam will be done today because of her impending mammogram. She needs to do better with her diet especially because of elevated triglycerides and elevated LDL particle number. She is otherwise up-to-date on things that need to be done.         Patient Active Problem List   Diagnosis Date Noted  . Gastroesophageal reflux disease with hiatal hernia 07/21/2013  . Osteoporosis 03/05/2013  . Tachycardia   . Edema   . Shortness of breath   . Carotid artery disease   . Dyslipidemia   . Mitral valve prolapse   . Ejection fraction   . Aortic insufficiency   . Hypertension   . Chest pain    Outpatient Encounter Prescriptions as of 11/10/2013  Medication Sig  . ACTONEL 35 MG tablet TAKE 1 TABLET ON EMPTY STOMACH WITH FULL GLASS OF WATER DON'T LIE DOWN, DRINK OR EAT FOR 30 MINS  . amlodipine-atorvastatin (CADUET) 10-20 MG per tablet Take 1 tablet by mouth daily.  Marland Kitchen aspirin EC 81 MG tablet Take 81 mg by mouth daily.  Marland Kitchen BENICAR 40 MG tablet TAKE 1 TABLET EVERY DAY  . Calcium Carbonate-Vitamin D (CALTRATE 600+D) 600-400 MG-UNIT per tablet Take 1 tablet by mouth daily.  . cholecalciferol (VITAMIN D) 400 UNITS TABS Take 1,000 Units by mouth.    . fish oil-omega-3 fatty acids 1000 MG capsule Take 1 g by mouth daily.    . metoprolol succinate (TOPROL-XL) 100 MG 24 hr tablet Take 1 tablet (100 mg total) by mouth daily. Take with or immediately following a meal.  . Multiple Vitamin (MULTIVITAMIN) tablet Take 1 tablet by mouth daily.    . pantoprazole (PROTONIX) 40 MG tablet TAKE 1 TABLET EVERY DAY    Review of Systems  Constitutional: Negative.   HENT: Negative.   Eyes: Negative.   Respiratory: Negative.   Cardiovascular: Negative.   Gastrointestinal: Negative.   Endocrine:  Negative.   Genitourinary: Negative.   Musculoskeletal: Negative.   Skin: Negative.   Allergic/Immunologic: Negative.   Neurological: Negative.   Hematological: Negative.   Psychiatric/Behavioral: Negative.        Objective:   Physical Exam  Nursing note and vitals reviewed. Constitutional: She is oriented to person, place, and time. She appears well-developed and well-nourished. No distress.  HENT:  Head: Normocephalic and atraumatic.  Right Ear: External ear normal.  Left Ear: External ear normal.  Nose: Nose normal.  Mouth/Throat: Oropharynx is clear and moist. No oropharyngeal exudate.  Eyes: Conjunctivae and EOM are normal. Pupils are equal, round, and reactive to light. Right eye exhibits no discharge. Left eye exhibits no discharge. No scleral icterus.  Neck: Normal range of motion. Neck supple. No thyromegaly present.  Cardiovascular: Normal rate, regular rhythm, normal heart sounds and intact distal pulses.  Exam reveals no gallop and no friction rub.   No murmur heard. At 60 per minute  Pulmonary/Chest: Effort normal and breath sounds normal. No respiratory distress. She has no wheezes. She has no rales. She exhibits no tenderness.  Abdominal: Soft. Bowel sounds are normal. She exhibits no mass. There is no tenderness. There is no rebound and no guarding.  Genitourinary:  A breast exam was done today and this revealed normal breasts without masses or axillary adenopathy.  Musculoskeletal: Normal range of motion. She exhibits no edema and no tenderness.  Lymphadenopathy:    She has no cervical adenopathy.  Neurological: She is alert and oriented to person, place, and time. She has normal reflexes. No cranial nerve deficit.  Skin: Skin is warm and dry.  Psychiatric: She has a normal mood and affect. Her behavior is normal. Judgment and thought content normal.   BP 114/63  Pulse 64  Temp(Src) 98.2 F (36.8 C) (Oral)  Ht 5\' 3"  (1.6 m)  Wt 146 lb (66.225 kg)  BMI 25.87  kg/m2        Assessment & Plan:  1. Carotid artery disease  2. Dyslipidemia  3. Hypertension  4. Osteoporosis  5. Metabolic syndrome   Patient Instructions                       Medicare Annual Wellness Visit  [facility] and the medical providers at [facility] strive to bring you the best medical care.  In doing so we not only want to address your current medical conditions and concerns but also to detect new conditions early and prevent illness, disease and health-related problems.    Medicare offers a yearly Wellness Visit which allows our clinical staff to assess your need for preventative services including immunizations, lifestyle education, counseling to decrease risk of preventable diseases and screening for fall risk and other medical concerns.    This visit is provided free of charge (no copay) for all Medicare recipients. The clinical pharmacists at [facility] have begun to conduct these Wellness Visits which will also include a thorough review of all your medications.    As you primary medical provider recommend that you make an appointment for your Annual Wellness Visit if you have not done so already this year.  You may set up this appointment before you leave today or you may call back ([phone]) and schedule an appointment.  Please make sure when you call that you mention that you are scheduling your Annual Wellness Visit with the clinical pharmacist so that the appointment may be made for the proper length of time.     Continue current medications. Continue good therapeutic lifestyle changes which include good diet and exercise. Fall precautions discussed with patient. If an FOBT was given today- please return it to our front desk. If you are over 33 years old - you may need Prevnar 61 or the adult Pneumonia vaccine.  Mode are and stick with better diet habits. Drink more water Do not forget to schedule your  mammogram.   Arrie Senate MD

## 2013-11-21 DIAGNOSIS — M6530 Trigger finger, unspecified finger: Secondary | ICD-10-CM | POA: Diagnosis not present

## 2013-12-03 DIAGNOSIS — Z961 Presence of intraocular lens: Secondary | ICD-10-CM | POA: Diagnosis not present

## 2013-12-03 DIAGNOSIS — H1045 Other chronic allergic conjunctivitis: Secondary | ICD-10-CM | POA: Diagnosis not present

## 2013-12-03 DIAGNOSIS — E119 Type 2 diabetes mellitus without complications: Secondary | ICD-10-CM | POA: Diagnosis not present

## 2013-12-03 DIAGNOSIS — H04129 Dry eye syndrome of unspecified lacrimal gland: Secondary | ICD-10-CM | POA: Diagnosis not present

## 2013-12-18 DIAGNOSIS — L259 Unspecified contact dermatitis, unspecified cause: Secondary | ICD-10-CM | POA: Diagnosis not present

## 2014-01-06 DIAGNOSIS — Z1231 Encounter for screening mammogram for malignant neoplasm of breast: Secondary | ICD-10-CM | POA: Diagnosis not present

## 2014-01-23 DIAGNOSIS — M6530 Trigger finger, unspecified finger: Secondary | ICD-10-CM | POA: Diagnosis not present

## 2014-01-24 ENCOUNTER — Other Ambulatory Visit: Payer: Self-pay | Admitting: Family Medicine

## 2014-02-23 DIAGNOSIS — M6740 Ganglion, unspecified site: Secondary | ICD-10-CM | POA: Diagnosis not present

## 2014-02-23 DIAGNOSIS — M6530 Trigger finger, unspecified finger: Secondary | ICD-10-CM | POA: Diagnosis not present

## 2014-03-16 DIAGNOSIS — M545 Low back pain, unspecified: Secondary | ICD-10-CM | POA: Diagnosis not present

## 2014-03-16 DIAGNOSIS — M5137 Other intervertebral disc degeneration, lumbosacral region: Secondary | ICD-10-CM | POA: Diagnosis not present

## 2014-03-16 DIAGNOSIS — IMO0002 Reserved for concepts with insufficient information to code with codable children: Secondary | ICD-10-CM | POA: Diagnosis not present

## 2014-03-18 ENCOUNTER — Other Ambulatory Visit (INDEPENDENT_AMBULATORY_CARE_PROVIDER_SITE_OTHER): Payer: Medicare Other

## 2014-03-18 DIAGNOSIS — E559 Vitamin D deficiency, unspecified: Secondary | ICD-10-CM | POA: Diagnosis not present

## 2014-03-18 DIAGNOSIS — I10 Essential (primary) hypertension: Secondary | ICD-10-CM

## 2014-03-18 DIAGNOSIS — R5383 Other fatigue: Principal | ICD-10-CM

## 2014-03-18 DIAGNOSIS — E785 Hyperlipidemia, unspecified: Secondary | ICD-10-CM

## 2014-03-18 DIAGNOSIS — R5381 Other malaise: Secondary | ICD-10-CM | POA: Diagnosis not present

## 2014-03-18 LAB — POCT CBC
Granulocyte percent: 67.2 %G (ref 37–80)
HCT, POC: 38.2 % (ref 37.7–47.9)
HEMOGLOBIN: 12.9 g/dL (ref 12.2–16.2)
Lymph, poc: 1.5 (ref 0.6–3.4)
MCH, POC: 30.9 pg (ref 27–31.2)
MCHC: 33.8 g/dL (ref 31.8–35.4)
MCV: 91.4 fL (ref 80–97)
MPV: 7 fL (ref 0–99.8)
POC GRANULOCYTE: 3.6 (ref 2–6.9)
POC LYMPH PERCENT: 29.1 %L (ref 10–50)
Platelet Count, POC: 213 10*3/uL (ref 142–424)
RBC: 4.2 M/uL (ref 4.04–5.48)
RDW, POC: 12.5 %
WBC: 5.3 10*3/uL (ref 4.6–10.2)

## 2014-03-18 NOTE — Progress Notes (Signed)
Patient came in for labs only.

## 2014-03-19 LAB — HEPATIC FUNCTION PANEL
ALT: 31 IU/L (ref 0–32)
AST: 28 IU/L (ref 0–40)
Albumin: 4.7 g/dL (ref 3.5–4.8)
Alkaline Phosphatase: 71 IU/L (ref 39–117)
BILIRUBIN DIRECT: 0.1 mg/dL (ref 0.00–0.40)
BILIRUBIN TOTAL: 0.4 mg/dL (ref 0.0–1.2)
TOTAL PROTEIN: 6.8 g/dL (ref 6.0–8.5)

## 2014-03-19 LAB — BMP8+EGFR
BUN/Creatinine Ratio: 24 (ref 11–26)
BUN: 13 mg/dL (ref 8–27)
CALCIUM: 9.8 mg/dL (ref 8.7–10.3)
CO2: 22 mmol/L (ref 18–29)
CREATININE: 0.54 mg/dL — AB (ref 0.57–1.00)
Chloride: 99 mmol/L (ref 97–108)
GFR calc Af Amer: 105 mL/min/{1.73_m2} (ref >59)
GFR, EST NON AFRICAN AMERICAN: 91 mL/min/{1.73_m2} (ref >59)
GLUCOSE: 105 mg/dL — AB (ref 65–99)
Potassium: 4.1 mmol/L (ref 3.5–5.2)
Sodium: 142 mmol/L (ref 134–144)

## 2014-03-19 LAB — NMR, LIPOPROFILE
Cholesterol: 171 mg/dL (ref 100–199)
HDL Cholesterol by NMR: 39 mg/dL — ABNORMAL LOW (ref >39)
HDL Particle Number: 31.8 umol/L (ref >=30.5)
LDL Particle Number: 1531 nmol/L — ABNORMAL HIGH (ref <1000)
LDL SIZE: 19.6 nm (ref >20.5)
LDLC SERPL CALC-MCNC: 83 mg/dL (ref 0–99)
LP-IR Score: 77 — ABNORMAL HIGH (ref <=45)
SMALL LDL PARTICLE NUMBER: 1087 nmol/L — AB (ref <=527)
TRIGLYCERIDES BY NMR: 247 mg/dL — AB (ref 0–149)

## 2014-03-19 LAB — VITAMIN D 25 HYDROXY (VIT D DEFICIENCY, FRACTURES): Vit D, 25-Hydroxy: 46.7 ng/mL (ref 30.0–100.0)

## 2014-03-22 ENCOUNTER — Other Ambulatory Visit: Payer: Self-pay | Admitting: Family Medicine

## 2014-03-24 ENCOUNTER — Encounter: Payer: Self-pay | Admitting: Family Medicine

## 2014-03-24 ENCOUNTER — Ambulatory Visit (INDEPENDENT_AMBULATORY_CARE_PROVIDER_SITE_OTHER): Payer: Medicare Other | Admitting: Family Medicine

## 2014-03-24 ENCOUNTER — Ambulatory Visit (INDEPENDENT_AMBULATORY_CARE_PROVIDER_SITE_OTHER): Payer: Medicare Other

## 2014-03-24 VITALS — BP 99/58 | HR 62 | Temp 98.4°F | Ht 63.0 in | Wt 143.0 lb

## 2014-03-24 DIAGNOSIS — M4716 Other spondylosis with myelopathy, lumbar region: Secondary | ICD-10-CM

## 2014-03-24 DIAGNOSIS — I10 Essential (primary) hypertension: Secondary | ICD-10-CM

## 2014-03-24 DIAGNOSIS — E785 Hyperlipidemia, unspecified: Secondary | ICD-10-CM

## 2014-03-24 DIAGNOSIS — M160 Bilateral primary osteoarthritis of hip: Secondary | ICD-10-CM

## 2014-03-24 DIAGNOSIS — E8881 Metabolic syndrome: Secondary | ICD-10-CM | POA: Diagnosis not present

## 2014-03-24 DIAGNOSIS — I739 Peripheral vascular disease, unspecified: Secondary | ICD-10-CM

## 2014-03-24 DIAGNOSIS — I779 Disorder of arteries and arterioles, unspecified: Secondary | ICD-10-CM

## 2014-03-24 DIAGNOSIS — M1610 Unilateral primary osteoarthritis, unspecified hip: Secondary | ICD-10-CM

## 2014-03-24 DIAGNOSIS — M169 Osteoarthritis of hip, unspecified: Secondary | ICD-10-CM

## 2014-03-24 DIAGNOSIS — I6529 Occlusion and stenosis of unspecified carotid artery: Secondary | ICD-10-CM | POA: Diagnosis not present

## 2014-03-24 IMAGING — CR DG CHEST 2V
2 series · 2 of 2 positions shown · non-contrast
Comparison: [DATE]

CLINICAL DATA: Routine physical exam, essential hypertension

EXAM:
CHEST  2 VIEW

[view not recorded (1 of 2)]
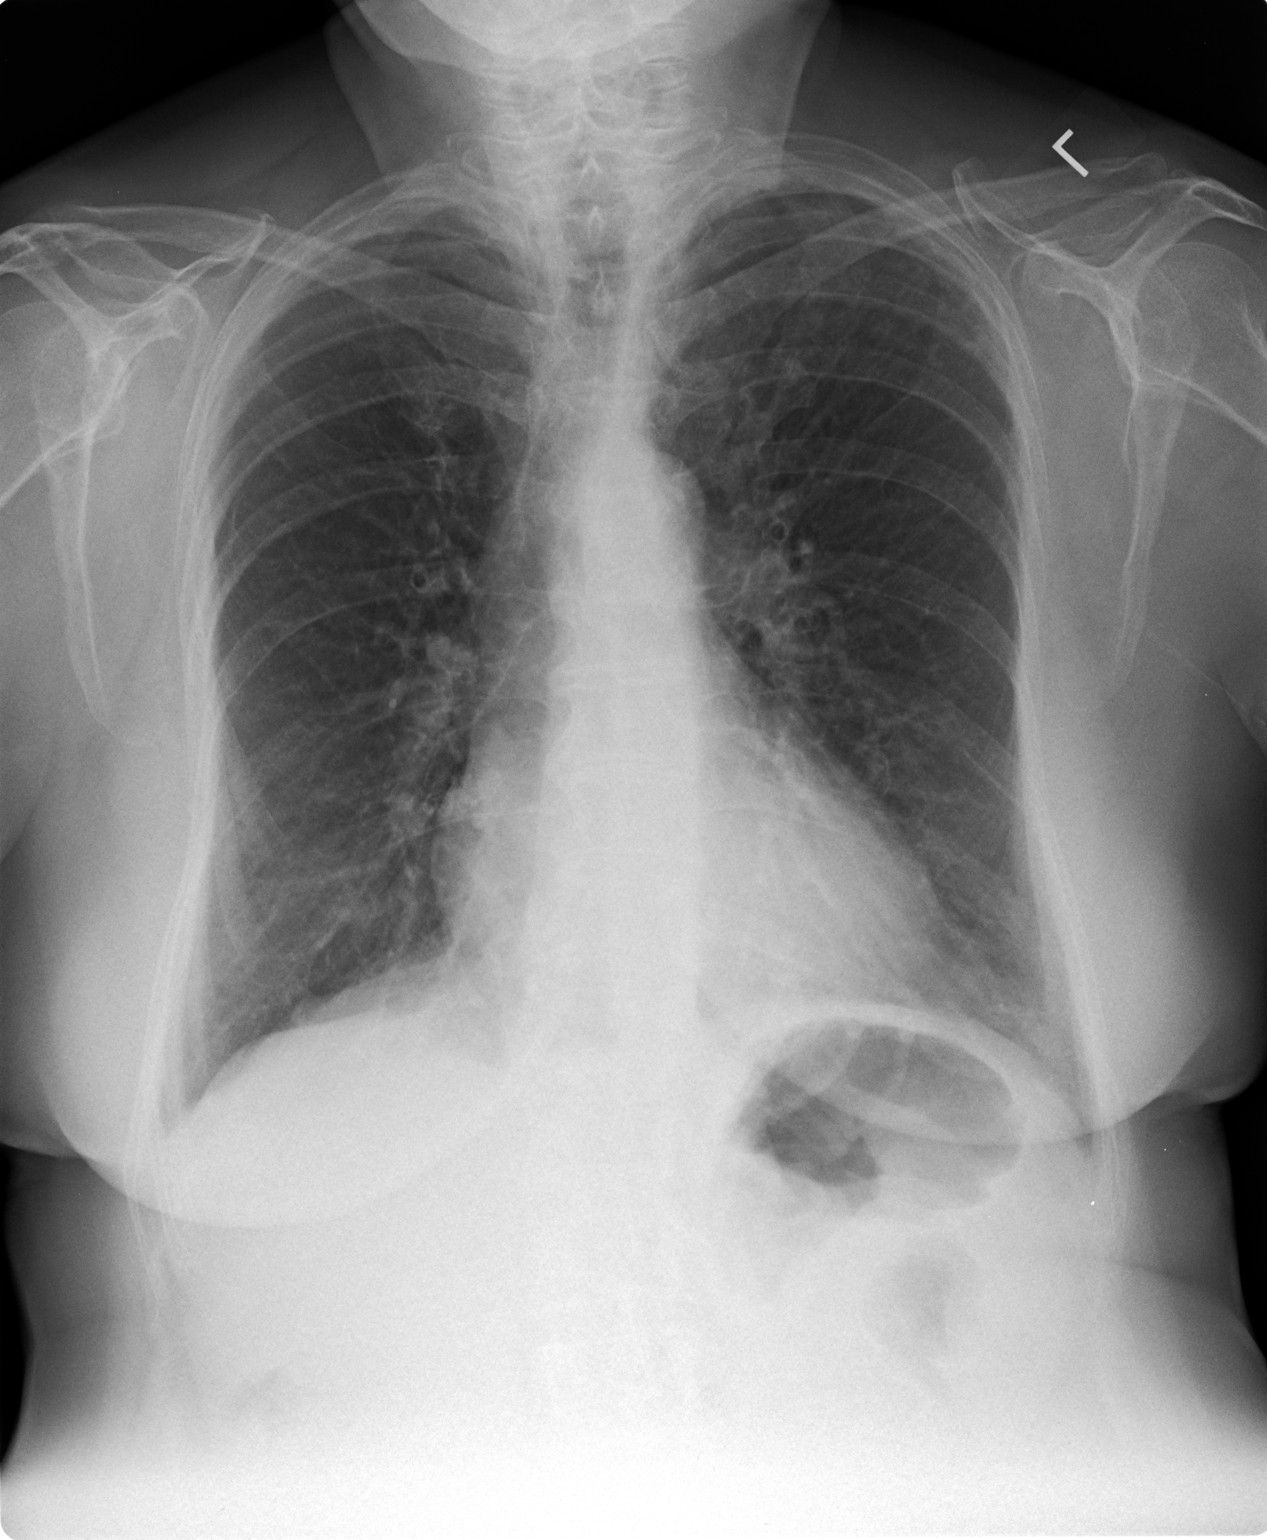

[view not recorded (2 of 2)]
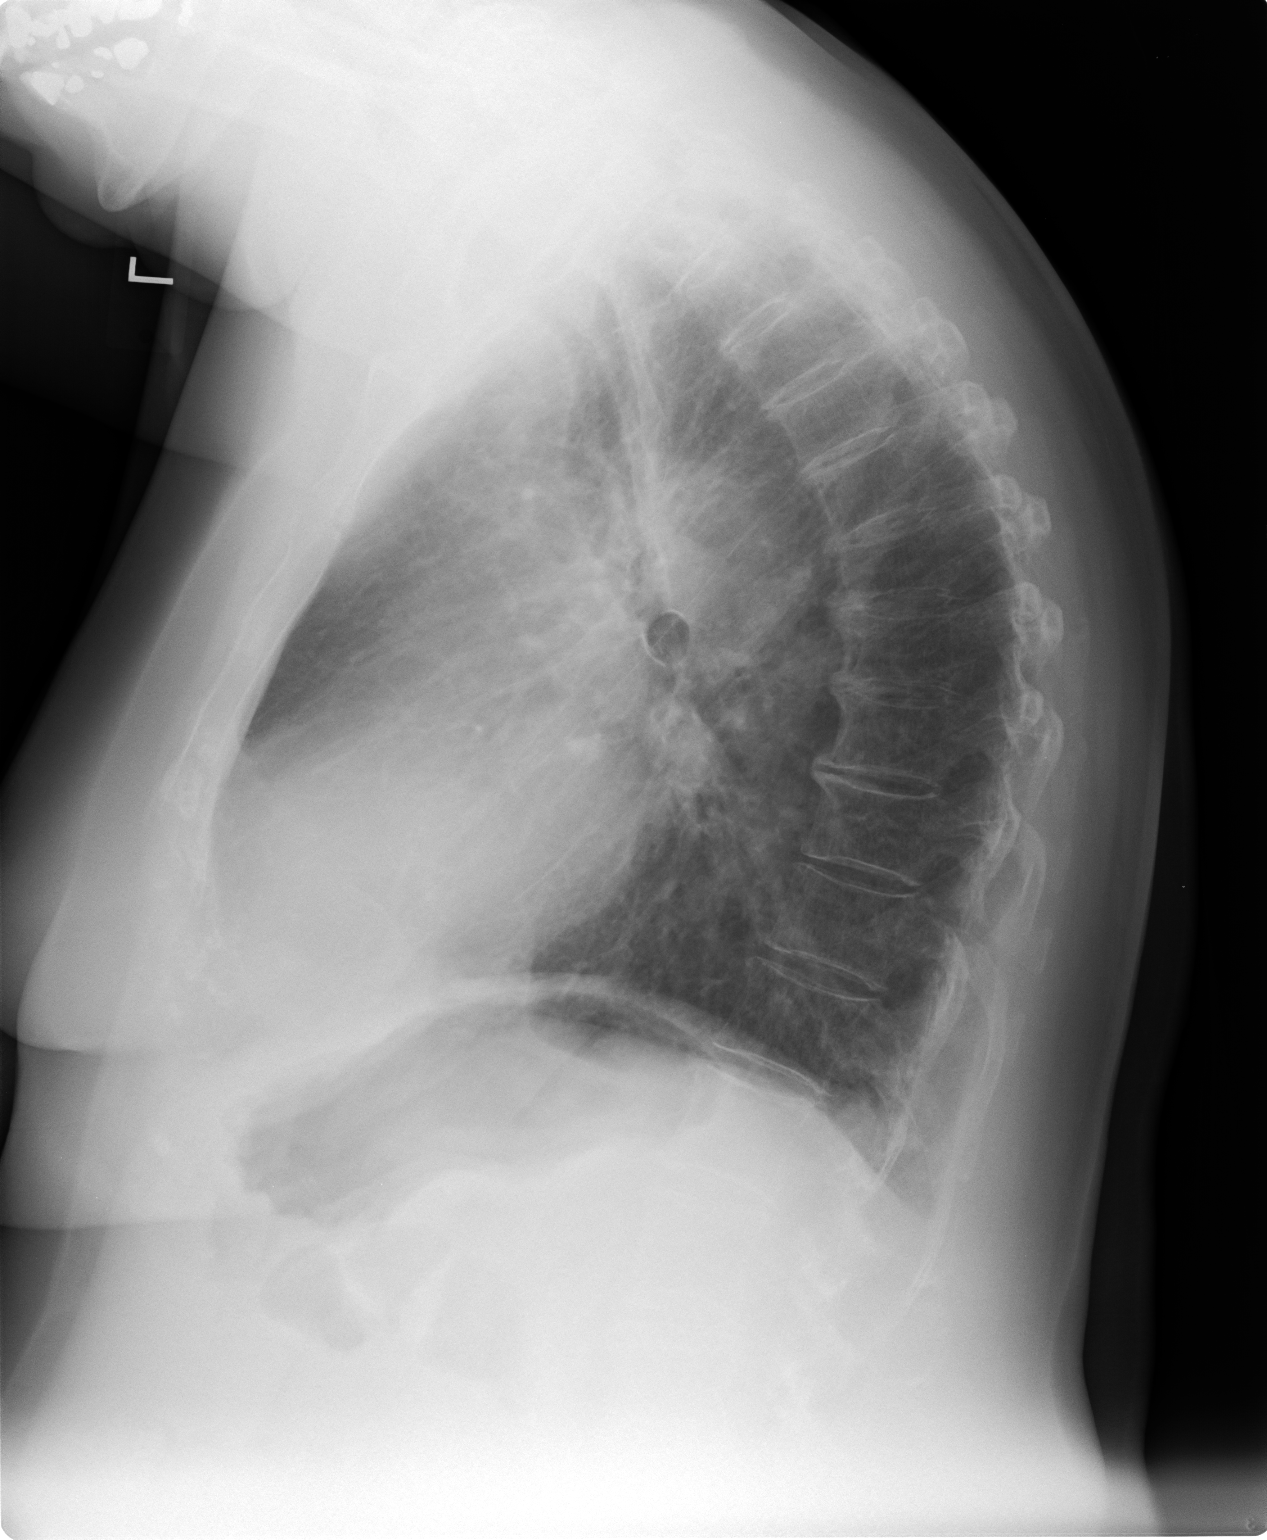

[2 of 2 positions shown; findings below may reference images not displayed]

FINDINGS: Enlargement of cardiac silhouette.

Mediastinal contours and pulmonary vascularity normal.

Emphysematous and bronchitic changes consistent with COPD.

LEFT apical scarring stable.

Chronic interstitial prominence, stable, basilar predominance.

No acute infiltrate, pleural effusion or pneumothorax.

Bones demineralized with endplate spur formation at multiple levels
of the mid thoracic spine.
IMPRESSION: COPD changes with chronic basilar interstitial changes and LEFT
apical scarring.

Enlargement of cardiac silhouette.

No acute abnormalities.

## 2014-03-24 NOTE — Patient Instructions (Addendum)
Medicare Annual Wellness Visit  [facility] and the medical providers at [facility] strive to bring you the best medical care.  In doing so we not only want to address your current medical conditions and concerns but also to detect new conditions early and prevent illness, disease and health-related problems.    Medicare offers a yearly Wellness Visit which allows our clinical staff to assess your need for preventative services including immunizations, lifestyle education, counseling to decrease risk of preventable diseases and screening for fall risk and other medical concerns.    This visit is provided free of charge (no copay) for all Medicare recipients. The clinical pharmacists at [facility] have begun to conduct these Wellness Visits which will also include a thorough review of all your medications.    As you primary medical provider recommend that you make an appointment for your Annual Wellness Visit if you have not done so already this year.  You may set up this appointment before you leave today or you may call back ([phone]) and schedule an appointment.  Please make sure when you call that you mention that you are scheduling your Annual Wellness Visit with the clinical pharmacist so that the appointment may be made for the proper length of time.     Continue current medications. Continue good therapeutic lifestyle changes which include good diet and exercise. Fall precautions discussed with patient. If an FOBT was given today- please return it to our front desk. If you are over 19 years old - you may need Prevnar 79 or the adult Pneumonia vaccine.  Continue with physical therapy as planned Continue with followup visit with orthopedic surgeons Continue to be careful and do not put yourself at risk for any falls Continue to drink plenty of fluids

## 2014-03-24 NOTE — Progress Notes (Signed)
Subjective:    Patient ID: Melissa Carpenter, female    DOB: 03/02/1934, 80 y.o.   MRN: [id number]  HPI Pt here for follow up and management of chronic medical problems. The patient continues to have problems with her hips left greater than right and her back and left leg pain. She has seen the orthopedist physician assistant. He has given her an appointment for physical therapy which she will Ramos. She plans to follow through with the physical therapy and a followup visit with the orthopedic surgeons. Her recent lab work was reviewed with her and she admits that she has not been following her diet as closely as she could. She plans to do better with this in the future. We will not make any changes with her medications at this point.        Patient Active Problem List   Diagnosis Date Noted  . Metabolic syndrome 63/14/9702  . Gastroesophageal reflux disease with hiatal hernia 07/21/2013  . Osteoporosis 03/05/2013  . Tachycardia   . Edema   . Shortness of breath   . Carotid artery disease   . Dyslipidemia   . Mitral valve prolapse   . Ejection fraction   . Aortic insufficiency   . Hypertension   . Chest pain    Outpatient Encounter Prescriptions as of 03/24/2014  Medication Sig  . ACTONEL 35 MG tablet TAKE 1 TABLET ON EMPTY STOMACH WITH FULL GLASS OF WATER DON'T LIE DOWN, DRINK OR EAT FOR 30 MINS  . amlodipine-atorvastatin (CADUET) 10-20 MG per tablet Take 1 tablet by mouth daily.  Marland Kitchen aspirin EC 81 MG tablet Take 81 mg by mouth daily.  Marland Kitchen BENICAR 40 MG tablet TAKE 1 TABLET EVERY DAY  . Calcium Carbonate-Vitamin D (CALTRATE 600+D) 600-400 MG-UNIT per tablet Take 1 tablet by mouth daily.  . cholecalciferol (VITAMIN D) 400 UNITS TABS Take 1,000 Units by mouth.    . fish oil-omega-3 fatty acids 1000 MG capsule Take 1 g by mouth daily.    . metoprolol succinate (TOPROL-XL) 100 MG 24 hr tablet Take 1 tablet (100 mg total) by mouth daily. Take with or immediately following a meal.  . Multiple  Vitamin (MULTIVITAMIN) tablet Take 1 tablet by mouth daily.    . pantoprazole (PROTONIX) 40 MG tablet TAKE 1 TABLET EVERY DAY    Review of Systems  Constitutional: Negative.   HENT: Negative.   Eyes: Negative.   Respiratory: Negative.   Cardiovascular: Negative.   Gastrointestinal: Negative.   Endocrine: Negative.   Genitourinary: Negative.   Musculoskeletal: Positive for arthralgias (hips/legs- went to [place]).  Skin: Negative.   Allergic/Immunologic: Negative.   Neurological: Negative.   Hematological: Negative.   Psychiatric/Behavioral: Negative.        Objective:   Physical Exam  Nursing note and vitals reviewed. Constitutional: She is oriented to person, place, and time. She appears well-developed and well-nourished.  The patient is alert and presents herself with a pleasant demeanor  HENT:  Head: Normocephalic and atraumatic.  Right Ear: External ear normal.  Left Ear: External ear normal.  Nose: Nose normal.  Mouth/Throat: Oropharynx is clear and moist. No oropharyngeal exudate.  Eyes: Conjunctivae and EOM are normal. Pupils are equal, round, and reactive to light. Right eye exhibits no discharge. Left eye exhibits no discharge. No scleral icterus.  Neck: Normal range of motion. Neck supple. No JVD present. No thyromegaly present.  Cardiovascular: Normal rate, regular rhythm, normal heart sounds and intact distal pulses.   No murmur  heard. At 60 per minute  Pulmonary/Chest: Effort normal and breath sounds normal. No respiratory distress. She has no wheezes. She has no rales. She exhibits no tenderness (slight upper abdominal tenderness).  Abdominal: Soft. Bowel sounds are normal. She exhibits no mass. There is tenderness. There is no rebound and no guarding.  Musculoskeletal: Normal range of motion. She exhibits no edema and no tenderness.  Range of motion with her lower extremities was hesitant upon arising from a sitting position. Leg raising was good to  60 bilaterally.  Lymphadenopathy:    She has no cervical adenopathy.  Neurological: She is alert and oriented to person, place, and time. She has normal reflexes. No cranial nerve deficit.  Skin: Skin is warm and dry. No rash noted.  Psychiatric: She has a normal mood and affect. Her behavior is normal. Judgment and thought content normal.   BP 99/58  Pulse 62  Temp(Src) 98.4 F (36.9 C) (Oral)  Ht 5\' 3"  (1.6 m)  Wt 143 lb (64.864 kg)  BMI 25.34 kg/m2  [facility] reading (PRIMARY) by  [facility]-chest x-ray, kyphosis no active disease                                        Assessment & Plan:  1. Dyslipidemia  2. Essential hypertension - DG Chest 2 View; Future  3. Metabolic syndrome  4. Carotid artery disease, unspecified laterality  5. Lumbar spondylosis with myelopathy  6. Osteoarthritis of both hips, unspecified osteoarthritis type  No orders of the defined types were placed in this encounter.   Patient Instructions                       Medicare Annual Wellness Visit  [facility] and the medical providers at [facility] strive to bring you the best medical care.  In doing so we not only want to address your current medical conditions and concerns but also to detect new conditions early and prevent illness, disease and health-related problems.    Medicare offers a yearly Wellness Visit which allows our clinical staff to assess your need for preventative services including immunizations, lifestyle education, counseling to decrease risk of preventable diseases and screening for fall risk and other medical concerns.    This visit is provided free of charge (no copay) for all Medicare recipients. The clinical pharmacists at [facility] have begun to conduct these Wellness Visits which will also include a thorough review of all your medications.    As you primary medical provider recommend that you make an appointment for your Annual  Wellness Visit if you have not done so already this year.  You may set up this appointment before you leave today or you may call back [phone]) and schedule an appointment.  Please make sure when you call that you mention that you are scheduling your Annual Wellness Visit with the clinical pharmacist so that the appointment may be made for the proper length of time.     Continue current medications. Continue good therapeutic lifestyle changes which include good diet and exercise. Fall precautions discussed with patient. If an FOBT was given today- please return it to our front desk. If you are over 47 years old - you may need Prevnar 46 or the adult Pneumonia vaccine.  Continue with physical therapy as planned Continue with followup visit with orthopedic surgeons Continue  to be careful and do not put yourself at risk for any falls Continue to drink plenty of fluids   Arrie Senate MD

## 2014-03-25 ENCOUNTER — Ambulatory Visit: Payer: Medicare Other | Attending: Physical Medicine and Rehabilitation | Admitting: Physical Therapy

## 2014-03-25 DIAGNOSIS — R269 Unspecified abnormalities of gait and mobility: Secondary | ICD-10-CM | POA: Diagnosis not present

## 2014-03-25 DIAGNOSIS — IMO0001 Reserved for inherently not codable concepts without codable children: Secondary | ICD-10-CM | POA: Diagnosis not present

## 2014-03-25 DIAGNOSIS — R5381 Other malaise: Secondary | ICD-10-CM | POA: Insufficient documentation

## 2014-03-25 DIAGNOSIS — I10 Essential (primary) hypertension: Secondary | ICD-10-CM | POA: Diagnosis not present

## 2014-03-25 DIAGNOSIS — M25559 Pain in unspecified hip: Secondary | ICD-10-CM | POA: Diagnosis not present

## 2014-03-25 DIAGNOSIS — M25659 Stiffness of unspecified hip, not elsewhere classified: Secondary | ICD-10-CM | POA: Diagnosis not present

## 2014-03-26 ENCOUNTER — Other Ambulatory Visit: Payer: Self-pay | Admitting: Family Medicine

## 2014-03-27 ENCOUNTER — Ambulatory Visit: Payer: Medicare Other | Admitting: Physical Therapy

## 2014-03-27 DIAGNOSIS — IMO0001 Reserved for inherently not codable concepts without codable children: Secondary | ICD-10-CM | POA: Diagnosis not present

## 2014-03-31 ENCOUNTER — Ambulatory Visit: Payer: Medicare Other | Admitting: Physical Therapy

## 2014-03-31 DIAGNOSIS — IMO0001 Reserved for inherently not codable concepts without codable children: Secondary | ICD-10-CM | POA: Diagnosis not present

## 2014-04-02 ENCOUNTER — Ambulatory Visit: Payer: Medicare Other | Admitting: Physical Therapy

## 2014-04-02 ENCOUNTER — Telehealth: Payer: Self-pay | Admitting: Family Medicine

## 2014-04-02 DIAGNOSIS — IMO0001 Reserved for inherently not codable concepts without codable children: Secondary | ICD-10-CM | POA: Diagnosis not present

## 2014-04-02 NOTE — Telephone Encounter (Signed)
Pt to come get stool card for hemocult.

## 2014-04-07 ENCOUNTER — Ambulatory Visit: Payer: Medicare Other | Admitting: *Deleted

## 2014-04-07 ENCOUNTER — Other Ambulatory Visit: Payer: Medicare Other

## 2014-04-07 DIAGNOSIS — Z1212 Encounter for screening for malignant neoplasm of rectum: Secondary | ICD-10-CM | POA: Diagnosis not present

## 2014-04-07 DIAGNOSIS — IMO0001 Reserved for inherently not codable concepts without codable children: Secondary | ICD-10-CM | POA: Diagnosis not present

## 2014-04-07 NOTE — Progress Notes (Signed)
Pt dropped off fobt only

## 2014-04-09 ENCOUNTER — Ambulatory Visit: Payer: Medicare Other | Admitting: Physical Therapy

## 2014-04-09 DIAGNOSIS — IMO0001 Reserved for inherently not codable concepts without codable children: Secondary | ICD-10-CM | POA: Diagnosis not present

## 2014-04-09 LAB — FECAL OCCULT BLOOD, IMMUNOCHEMICAL: FECAL OCCULT BLD: NEGATIVE

## 2014-04-14 ENCOUNTER — Ambulatory Visit: Payer: Medicare Other | Attending: Physical Medicine and Rehabilitation | Admitting: Physical Therapy

## 2014-04-14 DIAGNOSIS — IMO0001 Reserved for inherently not codable concepts without codable children: Secondary | ICD-10-CM | POA: Insufficient documentation

## 2014-04-14 DIAGNOSIS — M25659 Stiffness of unspecified hip, not elsewhere classified: Secondary | ICD-10-CM | POA: Insufficient documentation

## 2014-04-14 DIAGNOSIS — I10 Essential (primary) hypertension: Secondary | ICD-10-CM | POA: Diagnosis not present

## 2014-04-14 DIAGNOSIS — M25559 Pain in unspecified hip: Secondary | ICD-10-CM | POA: Insufficient documentation

## 2014-04-14 DIAGNOSIS — R5381 Other malaise: Secondary | ICD-10-CM | POA: Insufficient documentation

## 2014-04-14 DIAGNOSIS — R269 Unspecified abnormalities of gait and mobility: Secondary | ICD-10-CM | POA: Diagnosis not present

## 2014-04-15 DIAGNOSIS — M545 Low back pain, unspecified: Secondary | ICD-10-CM | POA: Diagnosis not present

## 2014-04-15 DIAGNOSIS — M1990 Unspecified osteoarthritis, unspecified site: Secondary | ICD-10-CM | POA: Diagnosis not present

## 2014-04-15 DIAGNOSIS — M1610 Unilateral primary osteoarthritis, unspecified hip: Secondary | ICD-10-CM | POA: Diagnosis not present

## 2014-04-16 ENCOUNTER — Encounter: Payer: Medicare Other | Admitting: *Deleted

## 2014-04-22 DIAGNOSIS — M25559 Pain in unspecified hip: Secondary | ICD-10-CM | POA: Diagnosis not present

## 2014-04-27 ENCOUNTER — Other Ambulatory Visit: Payer: Self-pay | Admitting: Family Medicine

## 2014-04-30 ENCOUNTER — Encounter: Payer: Self-pay | Admitting: *Deleted

## 2014-05-12 DIAGNOSIS — M1610 Unilateral primary osteoarthritis, unspecified hip: Secondary | ICD-10-CM | POA: Diagnosis not present

## 2014-05-25 ENCOUNTER — Ambulatory Visit (INDEPENDENT_AMBULATORY_CARE_PROVIDER_SITE_OTHER): Payer: Medicare Other

## 2014-05-25 ENCOUNTER — Encounter (INDEPENDENT_AMBULATORY_CARE_PROVIDER_SITE_OTHER): Payer: Self-pay

## 2014-05-25 DIAGNOSIS — Z23 Encounter for immunization: Secondary | ICD-10-CM | POA: Diagnosis not present

## 2014-06-12 DIAGNOSIS — M25551 Pain in right hip: Secondary | ICD-10-CM | POA: Diagnosis not present

## 2014-06-12 DIAGNOSIS — M1611 Unilateral primary osteoarthritis, right hip: Secondary | ICD-10-CM | POA: Diagnosis not present

## 2014-06-12 DIAGNOSIS — M1612 Unilateral primary osteoarthritis, left hip: Secondary | ICD-10-CM | POA: Diagnosis not present

## 2014-06-12 DIAGNOSIS — M25552 Pain in left hip: Secondary | ICD-10-CM | POA: Diagnosis not present

## 2014-06-23 ENCOUNTER — Other Ambulatory Visit: Payer: Self-pay | Admitting: Family Medicine

## 2014-07-01 DIAGNOSIS — M1990 Unspecified osteoarthritis, unspecified site: Secondary | ICD-10-CM | POA: Diagnosis not present

## 2014-07-01 DIAGNOSIS — M1611 Unilateral primary osteoarthritis, right hip: Secondary | ICD-10-CM | POA: Diagnosis not present

## 2014-07-15 ENCOUNTER — Other Ambulatory Visit (INDEPENDENT_AMBULATORY_CARE_PROVIDER_SITE_OTHER): Payer: Medicare Other

## 2014-07-15 DIAGNOSIS — E785 Hyperlipidemia, unspecified: Secondary | ICD-10-CM | POA: Diagnosis not present

## 2014-07-15 DIAGNOSIS — I10 Essential (primary) hypertension: Secondary | ICD-10-CM

## 2014-07-15 DIAGNOSIS — E559 Vitamin D deficiency, unspecified: Secondary | ICD-10-CM | POA: Diagnosis not present

## 2014-07-15 LAB — POCT CBC
GRANULOCYTE PERCENT: 69.8 % (ref 37–80)
HCT, POC: 40 % (ref 37.7–47.9)
Hemoglobin: 13.5 g/dL (ref 12.2–16.2)
LYMPH, POC: 1.5 (ref 0.6–3.4)
MCH, POC: 30.5 pg (ref 27–31.2)
MCHC: 33.7 g/dL (ref 31.8–35.4)
MCV: 90.6 fL (ref 80–97)
MPV: 8 fL (ref 0–99.8)
PLATELET COUNT, POC: 244 10*3/uL (ref 142–424)
POC Granulocyte: 4.3 (ref 2–6.9)
POC LYMPH PERCENT: 23.8 %L (ref 10–50)
RBC: 4.4 M/uL (ref 4.04–5.48)
RDW, POC: 13.1 %
WBC: 6.1 10*3/uL (ref 4.6–10.2)

## 2014-07-16 ENCOUNTER — Telehealth: Payer: Self-pay | Admitting: *Deleted

## 2014-07-16 LAB — BMP8+EGFR
BUN/Creatinine Ratio: 24 (ref 11–26)
BUN: 14 mg/dL (ref 8–27)
CALCIUM: 9.7 mg/dL (ref 8.7–10.3)
CO2: 27 mmol/L (ref 18–29)
CREATININE: 0.59 mg/dL (ref 0.57–1.00)
Chloride: 99 mmol/L (ref 97–108)
GFR calc Af Amer: 102 mL/min/{1.73_m2} (ref >59)
GFR calc non Af Amer: 88 mL/min/{1.73_m2} (ref >59)
GLUCOSE: 107 mg/dL — AB (ref 65–99)
Potassium: 4.5 mmol/L (ref 3.5–5.2)
Sodium: 141 mmol/L (ref 134–144)

## 2014-07-16 LAB — HEPATIC FUNCTION PANEL
ALBUMIN: 4.9 g/dL — AB (ref 3.5–4.8)
ALT: 32 IU/L (ref 0–32)
AST: 33 IU/L (ref 0–40)
Alkaline Phosphatase: 78 IU/L (ref 39–117)
Bilirubin, Direct: 0.08 mg/dL (ref 0.00–0.40)
Total Bilirubin: 0.4 mg/dL (ref 0.0–1.2)
Total Protein: 7.4 g/dL (ref 6.0–8.5)

## 2014-07-16 LAB — NMR, LIPOPROFILE
Cholesterol: 197 mg/dL (ref 100–199)
HDL CHOLESTEROL BY NMR: 52 mg/dL (ref >39)
HDL Particle Number: 38.3 umol/L (ref >=30.5)
LDL PARTICLE NUMBER: 1778 nmol/L — AB (ref <1000)
LDL Size: 20 nm (ref >20.5)
LDL-C: 100 mg/dL — ABNORMAL HIGH (ref 0–99)
LP-IR SCORE: 79 — AB (ref <=45)
Small LDL Particle Number: 1124 nmol/L — ABNORMAL HIGH (ref <=527)
Triglycerides by NMR: 224 mg/dL — ABNORMAL HIGH (ref 0–149)

## 2014-07-16 LAB — VITAMIN D 25 HYDROXY (VIT D DEFICIENCY, FRACTURES): Vit D, 25-Hydroxy: 45.7 ng/mL (ref 30.0–100.0)

## 2014-07-16 NOTE — Telephone Encounter (Signed)
-----   Message from Chipper Herb, MD sent at 07/16/2014  7:00 AM EST ----- Liver function test is very slightly elevated. All other liver function tests are within normal limits. Continue current treatment and we will follow this up in the future. The blood sugar is slightly elevated at 107 and this is consistent with past readings. This number should be less than 100. The creatinine, the most important kidney function test is within normal limits as well as all of the electrolytes including potassium. With advanced lipid testing, the total LDL particle number is elevated at 1778. The LDL C is slightly elevated at 100. The triglycerides are elevated at 224. The good cholesterol or the HDL particle number is within normal limits.Please confirm with the patient if she is intolerant to statin drugs. She should continue with omega-3 fatty acids or Fish oil as she is doing and she should try to improve her . lifestyle changes as much as possible

## 2014-07-16 NOTE — Telephone Encounter (Signed)
lmtcb regarding test results. 

## 2014-07-22 ENCOUNTER — Other Ambulatory Visit: Payer: Self-pay | Admitting: Family Medicine

## 2014-07-27 ENCOUNTER — Encounter: Payer: Self-pay | Admitting: Family Medicine

## 2014-07-27 ENCOUNTER — Ambulatory Visit (INDEPENDENT_AMBULATORY_CARE_PROVIDER_SITE_OTHER): Payer: Medicare Other | Admitting: Family Medicine

## 2014-07-27 VITALS — BP 121/64 | HR 68 | Temp 97.7°F | Ht 63.0 in | Wt 146.0 lb

## 2014-07-27 DIAGNOSIS — E559 Vitamin D deficiency, unspecified: Secondary | ICD-10-CM | POA: Diagnosis not present

## 2014-07-27 DIAGNOSIS — I10 Essential (primary) hypertension: Secondary | ICD-10-CM

## 2014-07-27 DIAGNOSIS — E785 Hyperlipidemia, unspecified: Secondary | ICD-10-CM | POA: Diagnosis not present

## 2014-07-27 DIAGNOSIS — I6529 Occlusion and stenosis of unspecified carotid artery: Secondary | ICD-10-CM | POA: Diagnosis not present

## 2014-07-27 DIAGNOSIS — K219 Gastro-esophageal reflux disease without esophagitis: Secondary | ICD-10-CM | POA: Diagnosis not present

## 2014-07-27 DIAGNOSIS — R6884 Jaw pain: Secondary | ICD-10-CM | POA: Diagnosis not present

## 2014-07-27 NOTE — Progress Notes (Signed)
Subjective:    Patient ID: Melissa Carpenter, female    DOB: [birth year], 80 y.o.   MRN: [id number]  HPI Pt here for follow up and management of chronic medical problems. The main complaint today is arthralgias in her hips. She has been seeing an orthopedist about this. The patient has had recent lab work which will be reviewed with her today. She is not needing refills. She is up-to-date on her health maintenance issues. This includes her immunizations.         Patient Active Problem List   Diagnosis Date Noted  . Metabolic syndrome 02/54/2706  . Gastroesophageal reflux disease with hiatal hernia 07/21/2013  . Osteoporosis 03/05/2013  . Tachycardia   . Edema   . Shortness of breath   . Carotid artery disease   . Dyslipidemia   . Mitral valve prolapse   . Ejection fraction   . Aortic insufficiency   . Hypertension   . Chest pain    Outpatient Encounter Prescriptions as of 07/27/2014  Medication Sig  . amlodipine-atorvastatin (CADUET) 10-20 MG per tablet TAKE 1 TABLET BY MOUTH DAILY.  Marland Kitchen aspirin EC 81 MG tablet Take 81 mg by mouth daily.  Marland Kitchen BENICAR 40 MG tablet TAKE 1 TABLET BY MOUTH EVERY DAY  . Calcium Carbonate-Vitamin D (CALTRATE 600+D) 600-400 MG-UNIT per tablet Take 1 tablet by mouth daily.  . cholecalciferol (VITAMIN D) 400 UNITS TABS Take 1,000 Units by mouth.    . fish oil-omega-3 fatty acids 1000 MG capsule Take 1 g by mouth daily.    . metoprolol succinate (TOPROL-XL) 100 MG 24 hr tablet TAKE 1 TABLET (100 MG TOTAL) BY MOUTH DAILY. TAKE WITH OR IMMEDIATELY FOLLOWING A MEAL.  . Multiple Vitamin (MULTIVITAMIN) tablet Take 1 tablet by mouth daily.    . pantoprazole (PROTONIX) 40 MG tablet TAKE 1 TABLET EVERY DAY  . risedronate (ACTONEL) 35 MG tablet TAKE 1 TAB ON EMPTY STOMACH WITH GLASS OF WATER DON'T LIE DOWN OR DRINK/EAT X 30MINS    Review of Systems  Constitutional: Negative.   HENT: Negative.   Eyes: Negative.   Respiratory: Negative.   Cardiovascular: Negative.     Gastrointestinal: Negative.   Endocrine: Negative.   Genitourinary: Negative.   Musculoskeletal: Positive for arthralgias (having hip pain - seeing [facility] ortho).  Skin: Negative.   Allergic/Immunologic: Negative.   Neurological: Negative.   Hematological: Negative.   Psychiatric/Behavioral: Negative.        Objective:   Physical Exam  Constitutional: She is oriented to person, place, and time. She appears well-developed and well-nourished. No distress.  HENT:  Head: Normocephalic and atraumatic.  Right Ear: External ear normal.  Left Ear: External ear normal.  Mouth/Throat: Oropharynx is clear and moist.  There is TMJ pain bilaterally with palpation .Nasal congestion bilaterally  Eyes: Conjunctivae and EOM are normal. Pupils are equal, round, and reactive to light. Right eye exhibits no discharge. Left eye exhibits no discharge. No scleral icterus.  Neck: Normal range of motion. Neck supple. No thyromegaly present.  There is a left carotid bruit that is very faint  Cardiovascular: Normal rate, regular rhythm and intact distal pulses.  Exam reveals no gallop and no friction rub.   Murmur heard. The heart has regular rate and rhythm at 60/m with a grade 1/6 systolic ejection murmur  Pulmonary/Chest: Effort normal and breath sounds normal. No respiratory distress. She has no wheezes. She has no rales. She exhibits no tenderness.  Abdominal: Soft. Bowel sounds are normal. She exhibits  no mass. There is no tenderness. There is no rebound and no guarding.  Musculoskeletal: She exhibits no edema or tenderness.  There is limited range of motion of both hips and limited abduction due to hip and thigh pain.  Lymphadenopathy:    She has no cervical adenopathy.  Neurological: She is alert and oriented to person, place, and time. She has normal reflexes. No cranial nerve deficit.  Skin: Skin is warm and dry. No rash noted.  There are small varicosities left ankle and the left ankle  slightly more swollen than the right  Psychiatric: She has a normal mood and affect. Her behavior is normal. Judgment and thought content normal.  Nursing note and vitals reviewed.  BP 121/64 mmHg  Pulse 68  Temp(Src) 97.7 F (36.5 C) (Oral)  Ht 5\' 3"  (1.6 m)  Wt 146 lb (66.225 kg)  BMI 25.87 kg/m2        Assessment & Plan:  1. Essential hypertension  2. Gastroesophageal reflux disease, esophagitis presence not specified  3. Hyperlipemia  4. Vitamin D deficiency  5. Jaw pain  Patient Instructions                       Medicare Annual Wellness Visit  [facility] and the medical providers at [facility] strive to bring you the best medical care.  In doing so we not only want to address your current medical conditions and concerns but also to detect new conditions early and prevent illness, disease and health-related problems.    Medicare offers a yearly Wellness Visit which allows our clinical staff to assess your need for preventative services including immunizations, lifestyle education, counseling to decrease risk of preventable diseases and screening for fall risk and other medical concerns.    This visit is provided free of charge (no copay) for all Medicare recipients. The clinical pharmacists at [facility] have begun to conduct these Wellness Visits which will also include a thorough review of all your medications.    As you primary medical provider recommend that you make an appointment for your Annual Wellness Visit if you have not done so already this year.  You may set up this appointment before you leave today or you may call back [phone]) and schedule an appointment.  Please make sure when you call that you mention that you are scheduling your Annual Wellness Visit with the clinical pharmacist so that the appointment may be made for the proper length of time.     Continue current medications. Continue good therapeutic  lifestyle changes which include good diet and exercise. Fall precautions discussed with patient. If an FOBT was given today- please return it to our front desk. If you are over 64 years old - you may need Prevnar 34 or the adult Pneumonia vaccine.  Flu Shots will be available at our office starting mid- September. Please call and schedule a FLU CLINIC APPOINTMENT.   Try to do better with diet and exercise Keep follow-up appointment with her orthopedist If cholesterol numbers remain elevated, we will consider increasing the atorvastatin by 20 mg to 10/40 at the next visit. Try to do less chewing, and try some ibuprofen twice daily after meals for the next 7 days since this helps the pain in her jaws   Arrie Senate MD

## 2014-07-27 NOTE — Patient Instructions (Addendum)
Medicare Annual Wellness Visit  [facility] and the medical providers at [facility] strive to bring you the best medical care.  In doing so we not only want to address your current medical conditions and concerns but also to detect new conditions early and prevent illness, disease and health-related problems.    Medicare offers a yearly Wellness Visit which allows our clinical staff to assess your need for preventative services including immunizations, lifestyle education, counseling to decrease risk of preventable diseases and screening for fall risk and other medical concerns.    This visit is provided free of charge (no copay) for all Medicare recipients. The clinical pharmacists at [facility] have begun to conduct these Wellness Visits which will also include a thorough review of all your medications.    As you primary medical provider recommend that you make an appointment for your Annual Wellness Visit if you have not done so already this year.  You may set up this appointment before you leave today or you may call back [phone]) and schedule an appointment.  Please make sure when you call that you mention that you are scheduling your Annual Wellness Visit with the clinical pharmacist so that the appointment may be made for the proper length of time.     Continue current medications. Continue good therapeutic lifestyle changes which include good diet and exercise. Fall precautions discussed with patient. If an FOBT was given today- please return it to our front desk. If you are over 60 years old - you may need Prevnar 37 or the adult Pneumonia vaccine.  Flu Shots will be available at our office starting mid- September. Please call and schedule a FLU CLINIC APPOINTMENT.   Try to do better with diet and exercise Keep follow-up appointment with her orthopedist If cholesterol numbers remain elevated, we will consider  increasing the atorvastatin by 20 mg to 10/40 at the next visit. Try to do less chewing, and try some ibuprofen twice daily after meals for the next 7 days since this helps the pain in her jaws

## 2014-07-28 DIAGNOSIS — M25552 Pain in left hip: Secondary | ICD-10-CM | POA: Diagnosis not present

## 2014-07-28 DIAGNOSIS — M1612 Unilateral primary osteoarthritis, left hip: Secondary | ICD-10-CM | POA: Diagnosis not present

## 2014-08-18 DIAGNOSIS — M1612 Unilateral primary osteoarthritis, left hip: Secondary | ICD-10-CM | POA: Diagnosis not present

## 2014-09-02 ENCOUNTER — Ambulatory Visit (INDEPENDENT_AMBULATORY_CARE_PROVIDER_SITE_OTHER): Payer: Medicare Other | Admitting: Family Medicine

## 2014-09-02 VITALS — BP 130/70 | HR 68 | Temp 96.8°F | Ht 63.0 in | Wt 147.0 lb

## 2014-09-02 DIAGNOSIS — J069 Acute upper respiratory infection, unspecified: Secondary | ICD-10-CM | POA: Diagnosis not present

## 2014-09-02 MED ORDER — AZITHROMYCIN 250 MG PO TABS: ORAL_TABLET | ORAL | Status: DC

## 2014-09-02 NOTE — Progress Notes (Signed)
   Subjective:    Patient ID: Melissa Carpenter, female    DOB: Nov 30, 1933, 81 y.o.   MRN: [id number]  HPI Patient is here for URI and cough complaints for 3 weeks.  She is taking robitussin DM.  Review of Systems  Constitutional: Negative for fever.  HENT: Negative for ear pain.   Eyes: Negative for discharge.  Respiratory: Negative for cough.   Cardiovascular: Negative for chest pain.  Gastrointestinal: Negative for abdominal distention.  Endocrine: Negative for polyuria.  Genitourinary: Negative for difficulty urinating.  Musculoskeletal: Negative for gait problem and neck pain.  Skin: Negative for color change and rash.  Neurological: Negative for speech difficulty and headaches.  Psychiatric/Behavioral: Negative for agitation.       Objective:    BP 130/70 mmHg  Pulse 68  Temp(Src) 96.8 F (36 C) (Oral)  Ht 5\' 3"  (1.6 m)  Wt 147 lb (66.679 kg)  BMI 26.05 kg/m2 Physical Exam  Constitutional: She is oriented to person, place, and time. She appears well-developed and well-nourished.  HENT:  Head: Normocephalic and atraumatic.  Mouth/Throat: Oropharynx is clear and moist.  Eyes: Pupils are equal, round, and reactive to light.  Neck: Normal range of motion. Neck supple.  Cardiovascular: Normal rate and regular rhythm.   No murmur heard. Pulmonary/Chest: Effort normal and breath sounds normal.  Abdominal: Soft. Bowel sounds are normal. There is no tenderness.  Neurological: She is alert and oriented to person, place, and time.  Skin: Skin is warm and dry.  Psychiatric: She has a normal mood and affect.          Assessment & Plan:     ICD-9-CM ICD-10-CM   1. URI (upper respiratory infection) 465.9 J06.9 azithromycin (ZITHROMAX) 250 MG tablet   Push po fluids, rest, tylenol and motrin otc prn as directed for fever, arthralgias, and myalgias.  Follow up prn if sx's continue or persist.  Return if symptoms worsen or fail to improve.  Lysbeth Penner FNP

## 2014-09-10 ENCOUNTER — Ambulatory Visit (INDEPENDENT_AMBULATORY_CARE_PROVIDER_SITE_OTHER): Payer: Medicare Other | Admitting: Family Medicine

## 2014-09-10 VITALS — BP 157/62 | HR 73 | Temp 97.0°F | Ht 63.0 in | Wt 147.6 lb

## 2014-09-10 DIAGNOSIS — J069 Acute upper respiratory infection, unspecified: Secondary | ICD-10-CM

## 2014-09-10 MED ORDER — METHYLPREDNISOLONE ACETATE 80 MG/ML IJ SUSP
40.0000 mg | Freq: Once | INTRAMUSCULAR | Status: AC
  Administered 2014-09-10: 40 mg via INTRAMUSCULAR

## 2014-09-10 MED ORDER — BENZONATATE 100 MG PO CAPS: 100.0000 mg | ORAL_CAPSULE | Freq: Three times a day (TID) | ORAL | Status: DC | PRN

## 2014-09-10 MED ORDER — LEVOFLOXACIN 250 MG PO TABS: 250.0000 mg | ORAL_TABLET | Freq: Every day | ORAL | Status: DC

## 2014-09-10 MED ORDER — METHYLPREDNISOLONE ACETATE 40 MG/ML IJ SUSP: 40.0000 mg | Freq: Once | INTRAMUSCULAR | Status: DC

## 2014-09-11 NOTE — Progress Notes (Signed)
   Subjective:    Patient ID: Melissa Carpenter, female    DOB: 11-19-1933, 81 y.o.   MRN: [id number]  HPI Patient is here with c/o uri sx's.  Review of Systems  Constitutional: Negative for fever.  HENT: Negative for ear pain.   Eyes: Negative for discharge.  Respiratory: Negative for cough.   Cardiovascular: Negative for chest pain.  Gastrointestinal: Negative for abdominal distention.  Endocrine: Negative for polyuria.  Genitourinary: Negative for difficulty urinating.  Musculoskeletal: Negative for gait problem and neck pain.  Skin: Negative for color change and rash.  Neurological: Negative for speech difficulty and headaches.  Psychiatric/Behavioral: Negative for agitation.       Objective:    BP 157/62 mmHg  Pulse 73  Temp(Src) 97 F (36.1 C) (Oral)  Ht 5\' 3"  (1.6 m)  Wt 147 lb 9.6 oz (66.951 kg)  BMI 26.15 kg/m2 Physical Exam  Constitutional: She is oriented to person, place, and time. She appears well-developed and well-nourished.  HENT:  Head: Normocephalic and atraumatic.  Mouth/Throat: Oropharynx is clear and moist.  Eyes: Pupils are equal, round, and reactive to light.  Neck: Normal range of motion. Neck supple.  Cardiovascular: Normal rate and regular rhythm.   No murmur heard. Pulmonary/Chest: Effort normal and breath sounds normal.  Abdominal: Soft. Bowel sounds are normal. There is no tenderness.  Neurological: She is alert and oriented to person, place, and time.  Skin: Skin is warm and dry.  Psychiatric: She has a normal mood and affect.          Assessment & Plan:     ICD-9-CM ICD-10-CM   1. URI (upper respiratory infection) 465.9 J06.9 levofloxacin (LEVAQUIN) 250 MG tablet     benzonatate (TESSALON PERLES) 100 MG capsule     methylPREDNISolone acetate (DEPO-MEDROL) injection 40 mg     DISCONTINUED: methylPREDNISolone acetate (DEPO-MEDROL) injection 40 mg     No Follow-up on file.  Lysbeth Penner FNP

## 2014-09-15 DIAGNOSIS — M1611 Unilateral primary osteoarthritis, right hip: Secondary | ICD-10-CM | POA: Diagnosis not present

## 2014-09-17 ENCOUNTER — Other Ambulatory Visit: Payer: Self-pay | Admitting: Family Medicine

## 2014-09-19 ENCOUNTER — Other Ambulatory Visit: Payer: Self-pay | Admitting: Family Medicine

## 2014-10-08 DIAGNOSIS — D485 Neoplasm of uncertain behavior of skin: Secondary | ICD-10-CM | POA: Diagnosis not present

## 2014-10-08 DIAGNOSIS — D234 Other benign neoplasm of skin of scalp and neck: Secondary | ICD-10-CM | POA: Diagnosis not present

## 2014-10-23 ENCOUNTER — Other Ambulatory Visit: Payer: Self-pay | Admitting: Family Medicine

## 2014-10-29 IMAGING — US US EXTREM LOW VENOUS BILAT
1 series · 13 of 24 positions shown · non-contrast
Comparison: None.

CLINICAL DATA: 81-year-old female with 3 week history of bilateral
lower extremity pain and redness



[Series 1: us extrem low venous bilat · 0.08mm/px · 13 of 57 slices shown]
[im 1/57]
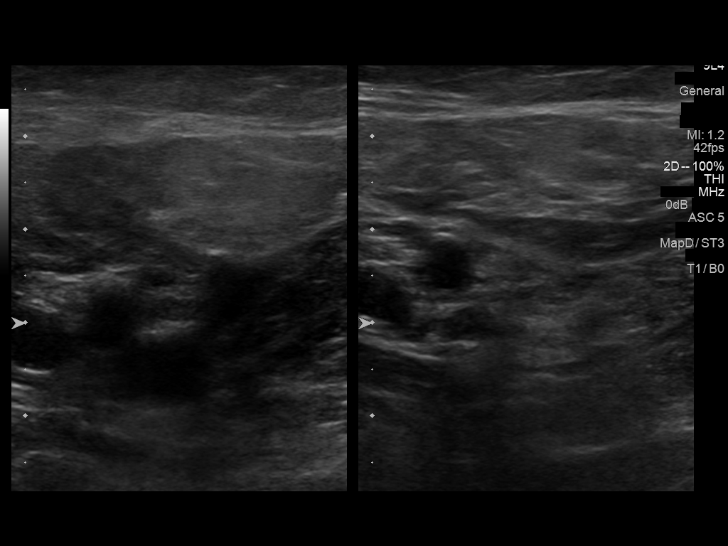
[im 5/57]
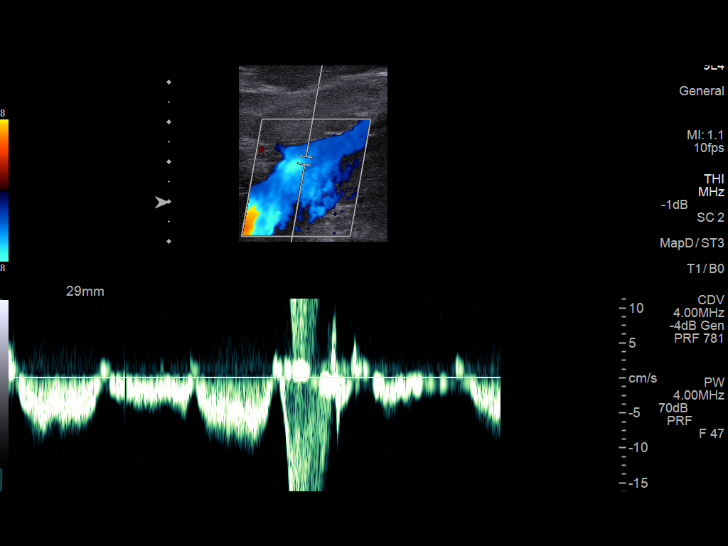
[im 10/57]
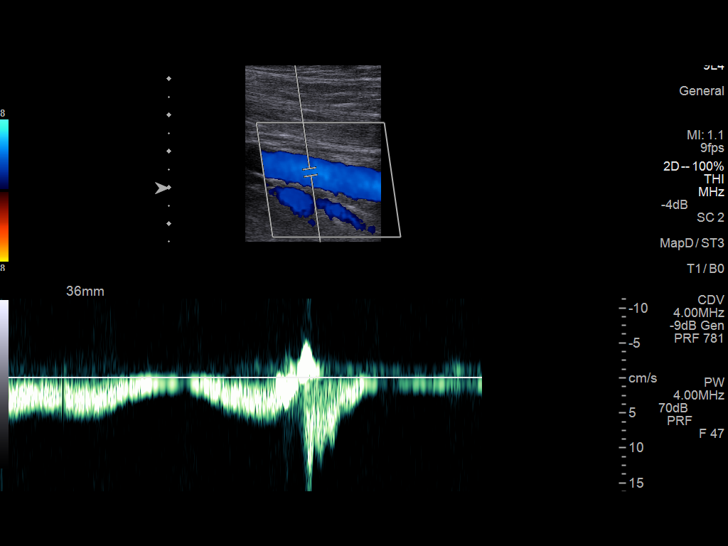
[im 15/57]
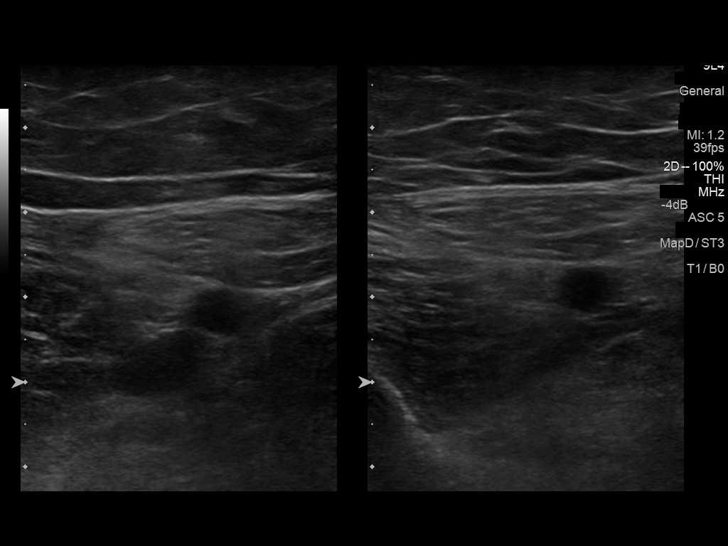
[im 20/57]
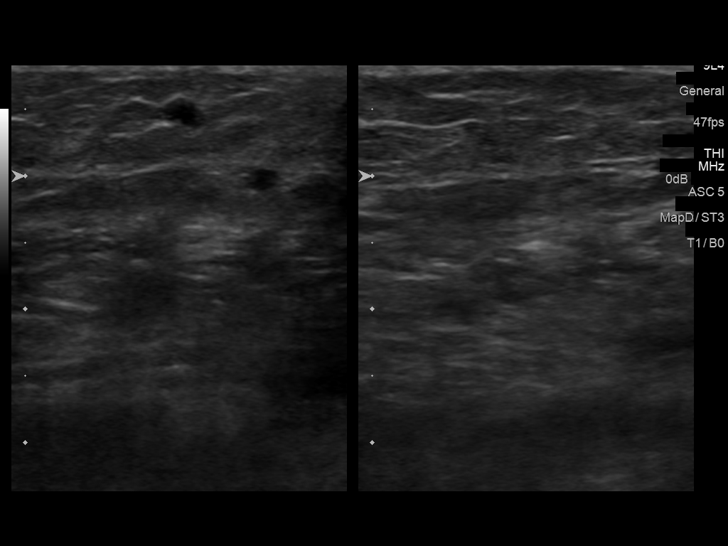
[im 25/57]
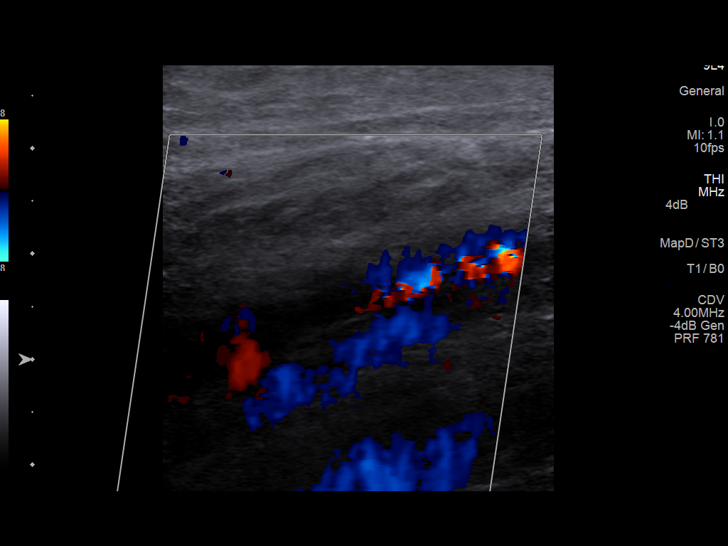
[im 30/57]
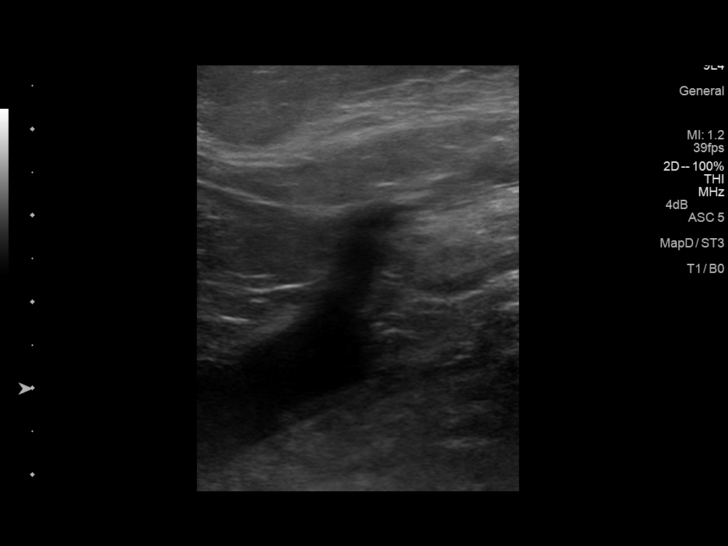
[im 32/57]
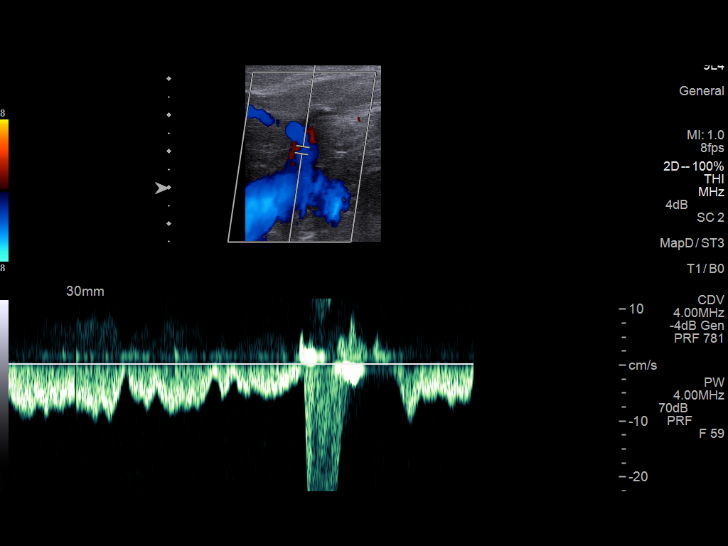
[im 37/57]
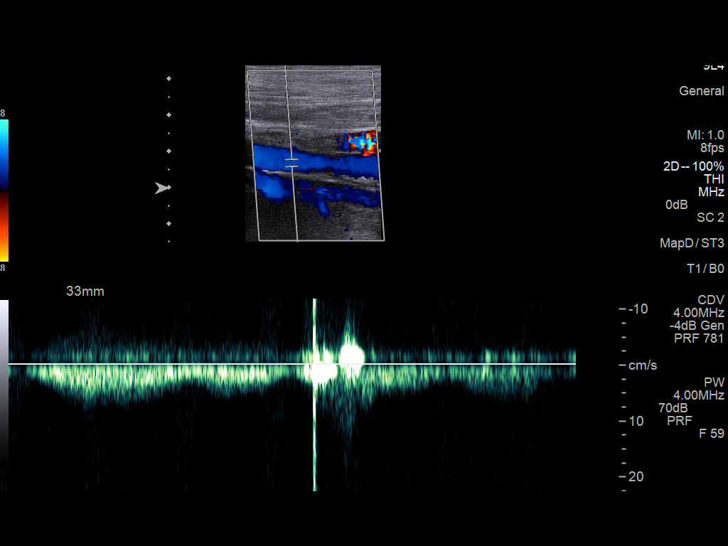
[im 42/57]
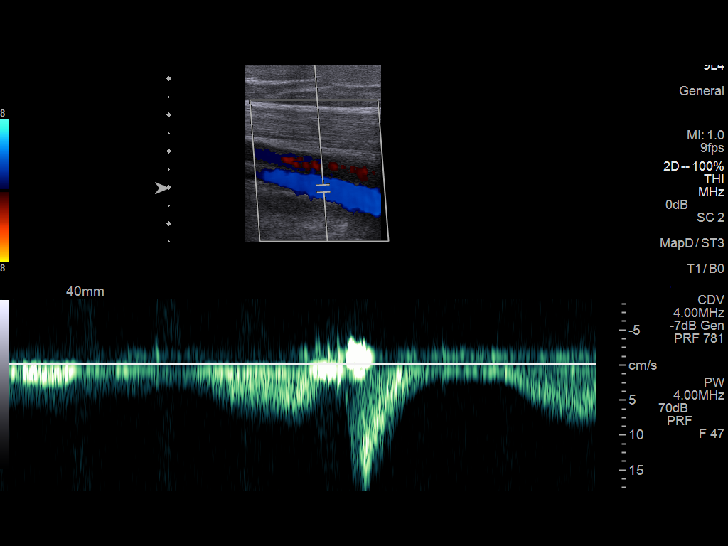
[im 47/57]
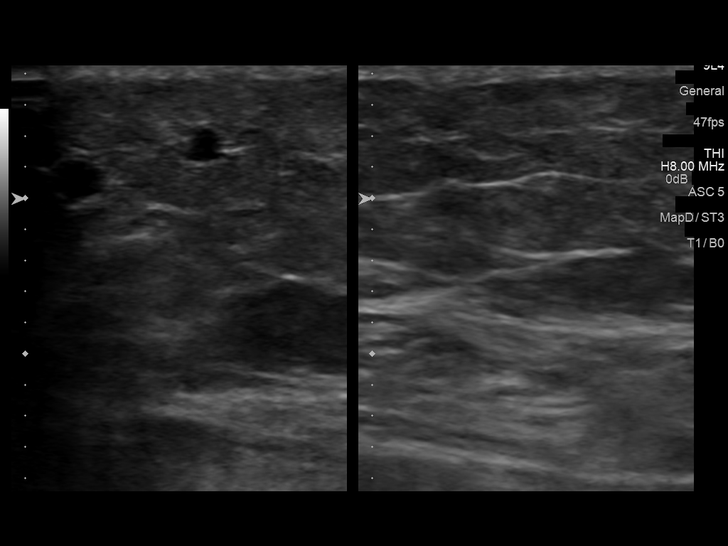
[im 52/57]
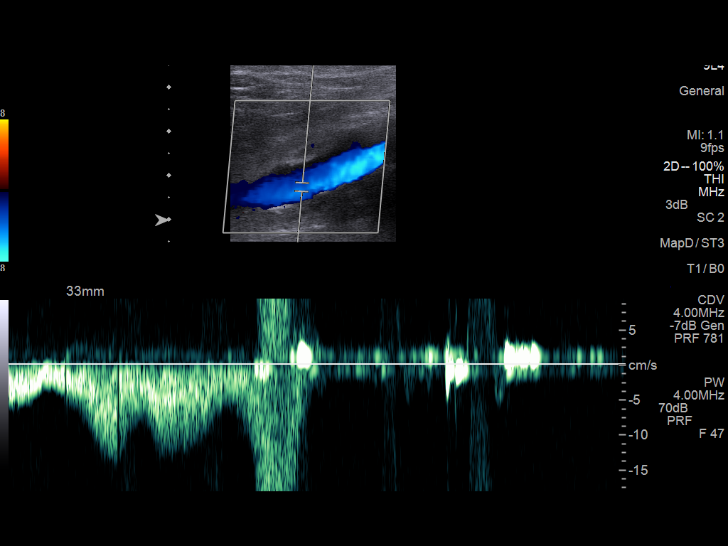
[im 57/57]
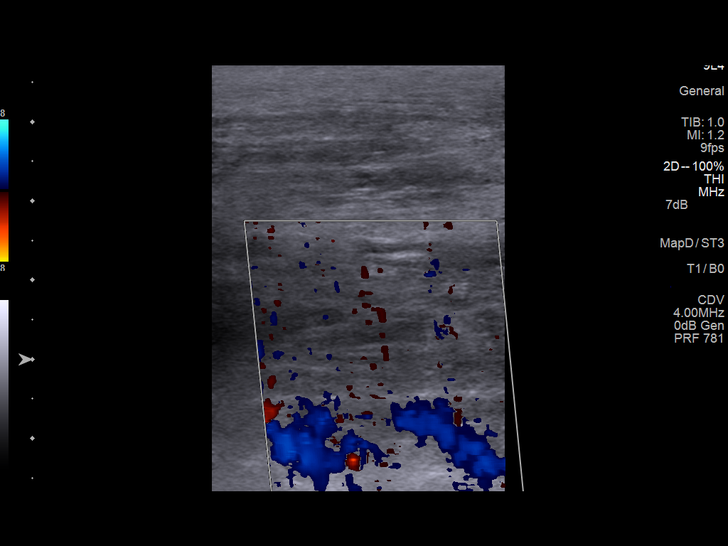

[13 of 24 positions shown; findings below may reference images not displayed]

FINDINGS: RIGHT LOWER EXTREMITY

Common Femoral Vein: No evidence of thrombus. Normal
compressibility, respiratory phasicity and response to augmentation.

Saphenofemoral Junction: No evidence of thrombus. Normal
compressibility and flow on color Doppler imaging.

Profunda Femoral Vein: No evidence of thrombus. Normal
compressibility and flow on color Doppler imaging.

Femoral Vein: No evidence of thrombus. Normal compressibility,
respiratory phasicity and response to augmentation.

Popliteal Vein: No evidence of thrombus. Normal compressibility,
respiratory phasicity and response to augmentation.

Calf Veins: No evidence of thrombus. Normal compressibility and flow
on color Doppler imaging.

Superficial Great Saphenous Vein: No evidence of thrombus. Normal
compressibility and flow on color Doppler imaging.

Venous Reflux:  None.

Other Findings:  None.

LEFT LOWER EXTREMITY

Common Femoral Vein: No evidence of thrombus. Normal
compressibility, respiratory phasicity and response to augmentation.

Saphenofemoral Junction: No evidence of thrombus. Normal
compressibility and flow on color Doppler imaging.

Profunda Femoral Vein: No evidence of thrombus. Normal
compressibility and flow on color Doppler imaging.

Femoral Vein: No evidence of thrombus. Normal compressibility,
respiratory phasicity and response to augmentation.

Popliteal Vein: No evidence of thrombus. Normal compressibility,
respiratory phasicity and response to augmentation.

Calf Veins: No evidence of thrombus. Normal compressibility and flow
on color Doppler imaging.

Superficial Great Saphenous Vein: No evidence of thrombus. Normal
compressibility and flow on color Doppler imaging.

Venous Reflux:  None.

Other Findings:  None.
IMPRESSION: No evidence of deep venous thrombosis.

## 2014-10-30 ENCOUNTER — Ambulatory Visit (INDEPENDENT_AMBULATORY_CARE_PROVIDER_SITE_OTHER): Payer: Medicare Other | Admitting: Cardiology

## 2014-10-30 ENCOUNTER — Encounter: Payer: Self-pay | Admitting: Cardiology

## 2014-10-30 ENCOUNTER — Ambulatory Visit: Payer: Medicare Other | Admitting: Cardiology

## 2014-10-30 VITALS — BP 138/62 | HR 71 | Ht 63.0 in | Wt 148.0 lb

## 2014-10-30 DIAGNOSIS — I10 Essential (primary) hypertension: Secondary | ICD-10-CM

## 2014-10-30 DIAGNOSIS — R072 Precordial pain: Secondary | ICD-10-CM

## 2014-10-30 DIAGNOSIS — R000 Tachycardia, unspecified: Secondary | ICD-10-CM

## 2014-10-30 DIAGNOSIS — I739 Peripheral vascular disease, unspecified: Secondary | ICD-10-CM

## 2014-10-30 DIAGNOSIS — I779 Disorder of arteries and arterioles, unspecified: Secondary | ICD-10-CM | POA: Diagnosis not present

## 2014-10-30 NOTE — Assessment & Plan Note (Signed)
The patient's carotid Dopplers were stable one year ago. She will need to follow-up soon and this will be arranged.

## 2014-10-30 NOTE — Progress Notes (Signed)
Cardiology Office Note   Date:  10/30/2014   ID:  Melissa Carpenter, DOB 06/10/1934, MRN [id number]  PCP:  Redge Gainer, MD  Cardiologist:  Dola Argyle, MD   Chief Complaint  Patient presents with  . Appointment    Follow-up shortness of breath.      History of Present Illness: Melissa Carpenter is a 81 y.o. female who presents today to follow-up with history of some shortness of breath and some mild sinus tachycardia. She has carotid artery disease that is being followed carefully area I saw her last October 17, 2013. She had a follow-up carotid Doppler showing moderate disease with plans for a one-year follow-up. I also see her when she comes in with her husband.    Past Medical History  Diagnosis Date  . Shortness of breath   . Carotid artery disease     , Doppler, 2003  . Dyslipidemia   . Mitral valve prolapse     Echo, 2009, very mild intermittent prolapse of the posterior leaflet, no MR  . Ejection fraction     EF 60-70%, echo, December, 2009 / EF 55-60%, December, 2012  . Aortic insufficiency     Mild, echo, December, 2009  . Hypertension   . DJD (degenerative joint disease)   . Chest pain     Catheterization, 2003, no significant CAD  . Edema     November, 2012  . Diastolic dysfunction     Mild diastolic dysfunction, echo, December, 2012  . Tachycardia     Nighttime tachycardia, February, 2014    Past Surgical History  Procedure Laterality Date  . Cardiac catheterization  sept 2003    no significant cad  . Appendectomy    . Tonsillectomy    . Hysterectomy--unknown type      Patient Active Problem List   Diagnosis Date Noted  . Metabolic syndrome 10/62/6948  . Gastroesophageal reflux disease with hiatal hernia 07/21/2013  . Osteoporosis 03/05/2013  . Tachycardia   . Edema   . Shortness of breath   . Carotid artery disease   . Dyslipidemia   . Mitral valve prolapse   . Ejection fraction   . Aortic insufficiency   . Hypertension   . Chest pain        Current Outpatient Prescriptions  Medication Sig Dispense Refill  . amlodipine-atorvastatin (CADUET) 10-20 MG per tablet TAKE 1 TABLET BY MOUTH DAILY. 90 tablet 1  . aspirin EC 81 MG tablet Take 81 mg by mouth daily.    Marland Kitchen BENICAR 40 MG tablet TAKE 1 TABLET BY MOUTH EVERY DAY 90 tablet 0  . Calcium Carbonate-Vitamin D (CALTRATE 600+D) 600-400 MG-UNIT per tablet Take 1 tablet by mouth daily.    . cholecalciferol (VITAMIN D) 400 UNITS TABS Take 1,000 Units by mouth.      . fish oil-omega-3 fatty acids 1000 MG capsule Take 1 g by mouth daily.      . metoprolol succinate (TOPROL-XL) 100 MG 24 hr tablet TAKE 1 TABLET (100 MG TOTAL) BY MOUTH DAILY. TAKE WITH OR IMMEDIATELY FOLLOWING A MEAL. 90 tablet 1  . Multiple Vitamin (MULTIVITAMIN) tablet Take 1 tablet by mouth daily.      . pantoprazole (PROTONIX) 40 MG tablet TAKE 1 TABLET EVERY DAY 90 tablet 1  . risedronate (ACTONEL) 35 MG tablet TAKE 1 TAB ON EMPTY STOMACH WITH GLASS OF WATER DON'T LIE DOWN OR DRINK/EAT X 30MINS 12 tablet 0   No current facility-administered medications for this visit.  Allergies:   Codeine; Morphine; Penicillins; Sulfonamide derivatives; and Sulfites    Social History:  The patient  reports that she has never smoked. She has never used smokeless tobacco. She reports that she does not drink alcohol or use illicit drugs.   Family History:  The patient's family history includes Bone cancer in her sister; Heart attack in her mother; Hypertension in her father; Lung cancer in her grandchild.    ROS:  Please see the history of present illness.    Patient denies fever, chills, headache, sweats, rash, change in vision, change in hearing, chest pain, cough, nausea or vomiting, urinary symptoms. All other systems are reviewed and are negative.   PHYSICAL EXAM: VS:  BP 138/62 mmHg  Pulse 71  Ht 5\' 3"  (1.6 m)  Wt 148 lb (67.132 kg)  BMI 26.22 kg/m2 , The patient is stable today. She is oriented to person time and  place. Affect is normal. Head is atraumatic. Sclera and conjunctiva are normal. There is no jugulovenous distention. Lungs are clear. Respiratory effort is nonlabored. Cardiac exam reveals an S1 and S2. The abdomen is soft. There is no peripheral edema. There are no musculoskeletal deformities. There are no skin rashes.  EKG:   EKG is done today and reviewed by me. There is normal sinus rhythm. There is no change from the past.   Recent Labs: 07/15/2014: ALT 32; BUN 14; Creatinine 0.59; Hemoglobin 13.5; Potassium 4.5; Sodium 141    Lipid Panel    Component Value Date/Time   CHOL 197 07/15/2014 1413   CHOL 165 01/13/2013 0833   TRIG 224* 07/15/2014 1413   TRIG 240* 01/13/2013 0833   HDL 52 07/15/2014 1413   HDL 39* 01/13/2013 0833   LDLCALC 83 03/18/2014 0819   LDLCALC 78 01/13/2013 0833      Wt Readings from Last 3 Encounters:  10/30/14 148 lb (67.132 kg)  09/10/14 147 lb 9.6 oz (66.951 kg)  09/02/14 147 lb (66.679 kg)      Current medicines are reviewed       ASSESSMENT AND PLAN:

## 2014-10-30 NOTE — Assessment & Plan Note (Signed)
She has not had any recurrent chest discomfort. No change in therapy.

## 2014-10-30 NOTE — Patient Instructions (Signed)
Your physician recommends that you continue on your current medications as directed. Please refer to the Current Medication list given to you today.  Your physician recommends that you schedule a follow-up appointment in: To be determined at your husband's next office visit with Dr Ron Parker.

## 2014-10-30 NOTE — Assessment & Plan Note (Signed)
She has not been bothered by any tachycardia at this time. No further workup.  She will return when her husband has his next visit. At that time we will arrange for follow-up physician. I'm hopeful that they can be followed by [name] in the [place] office.

## 2014-11-03 ENCOUNTER — Other Ambulatory Visit (HOSPITAL_COMMUNITY): Payer: Self-pay | Admitting: Cardiology

## 2014-11-03 DIAGNOSIS — I6523 Occlusion and stenosis of bilateral carotid arteries: Secondary | ICD-10-CM

## 2014-11-10 ENCOUNTER — Ambulatory Visit (HOSPITAL_COMMUNITY): Payer: Medicare Other | Attending: Cardiology | Admitting: Cardiology

## 2014-11-10 DIAGNOSIS — I6523 Occlusion and stenosis of bilateral carotid arteries: Secondary | ICD-10-CM | POA: Insufficient documentation

## 2014-11-10 NOTE — Progress Notes (Signed)
Carotid Duplex Scan Performed 

## 2014-11-19 ENCOUNTER — Other Ambulatory Visit: Payer: Self-pay

## 2014-11-19 DIAGNOSIS — E041 Nontoxic single thyroid nodule: Secondary | ICD-10-CM

## 2014-11-25 DIAGNOSIS — M25561 Pain in right knee: Secondary | ICD-10-CM | POA: Diagnosis not present

## 2014-11-25 DIAGNOSIS — M7062 Trochanteric bursitis, left hip: Secondary | ICD-10-CM | POA: Diagnosis not present

## 2014-11-25 DIAGNOSIS — M7061 Trochanteric bursitis, right hip: Secondary | ICD-10-CM | POA: Diagnosis not present

## 2014-11-25 DIAGNOSIS — M25562 Pain in left knee: Secondary | ICD-10-CM | POA: Diagnosis not present

## 2014-12-01 ENCOUNTER — Other Ambulatory Visit (INDEPENDENT_AMBULATORY_CARE_PROVIDER_SITE_OTHER): Payer: Medicare Other

## 2014-12-01 ENCOUNTER — Ambulatory Visit (HOSPITAL_COMMUNITY)
Admission: RE | Admit: 2014-12-01 | Discharge: 2014-12-01 | Disposition: A | Discharge status: Home / Self Care | Payer: Medicare Other | Source: Ambulatory Visit | Attending: Cardiology | Admitting: Cardiology

## 2014-12-01 DIAGNOSIS — I10 Essential (primary) hypertension: Secondary | ICD-10-CM | POA: Diagnosis not present

## 2014-12-01 DIAGNOSIS — E041 Nontoxic single thyroid nodule: Secondary | ICD-10-CM | POA: Insufficient documentation

## 2014-12-01 DIAGNOSIS — E8881 Metabolic syndrome: Secondary | ICD-10-CM | POA: Diagnosis not present

## 2014-12-01 DIAGNOSIS — I779 Disorder of arteries and arterioles, unspecified: Secondary | ICD-10-CM

## 2014-12-01 DIAGNOSIS — E042 Nontoxic multinodular goiter: Secondary | ICD-10-CM | POA: Diagnosis not present

## 2014-12-01 DIAGNOSIS — K219 Gastro-esophageal reflux disease without esophagitis: Secondary | ICD-10-CM

## 2014-12-01 DIAGNOSIS — I739 Peripheral vascular disease, unspecified: Secondary | ICD-10-CM

## 2014-12-01 DIAGNOSIS — E559 Vitamin D deficiency, unspecified: Secondary | ICD-10-CM

## 2014-12-01 DIAGNOSIS — E785 Hyperlipidemia, unspecified: Secondary | ICD-10-CM | POA: Diagnosis not present

## 2014-12-01 LAB — POCT CBC
Granulocyte percent: 70.4 %G (ref 37–80)
HEMATOCRIT: 41.4 % (ref 37.7–47.9)
Hemoglobin: 13.5 g/dL (ref 12.2–16.2)
Lymph, poc: 1.9 (ref 0.6–3.4)
MCH: 30.4 pg (ref 27–31.2)
MCHC: 32.7 g/dL (ref 31.8–35.4)
MCV: 93.2 fL (ref 80–97)
MPV: 8.1 fL (ref 0–99.8)
PLATELET COUNT, POC: 286 10*3/uL (ref 142–424)
POC GRANULOCYTE: 5.6 (ref 2–6.9)
POC LYMPH PERCENT: 24.2 %L (ref 10–50)
RBC: 4.44 M/uL (ref 4.04–5.48)
RDW, POC: 13.2 %
WBC: 7.9 10*3/uL (ref 4.6–10.2)

## 2014-12-01 IMAGING — US US SOFT TISSUE HEAD/NECK
1 series · 14 of 25 positions shown · non-contrast
Comparison: None.

CLINICAL DATA: Thyroid nodules seen on Carotid Doppler

EXAM:
THYROID ULTRASOUND
TECHNIQUE: Ultrasound examination of the thyroid gland and adjacent soft
tissues was performed.

[Series 1: us soft tissue head/neck · 0.06mm/px · 14 of 41 slices shown]
[im 1/41]
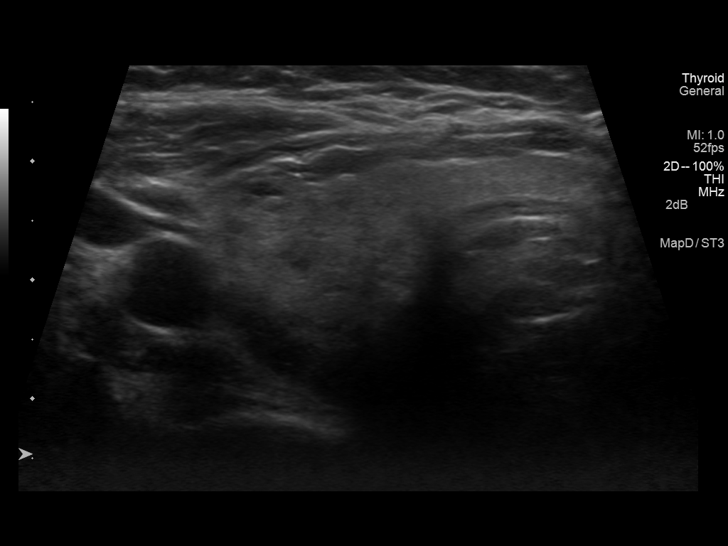
[im 4/41]
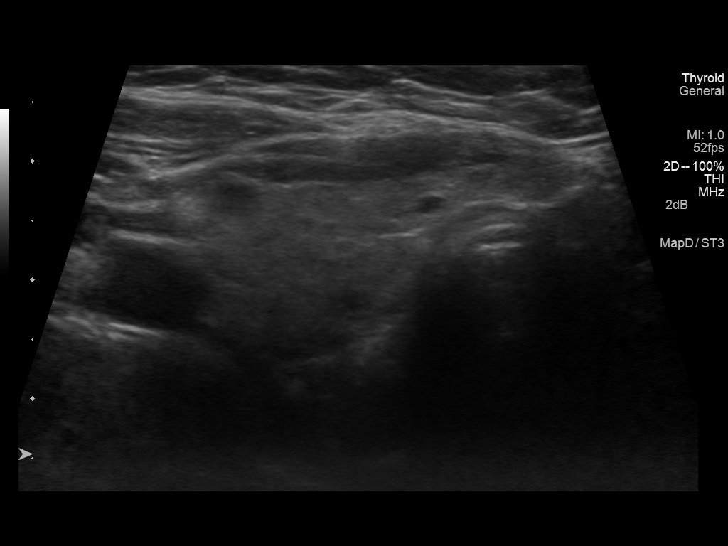
[im 7/41]
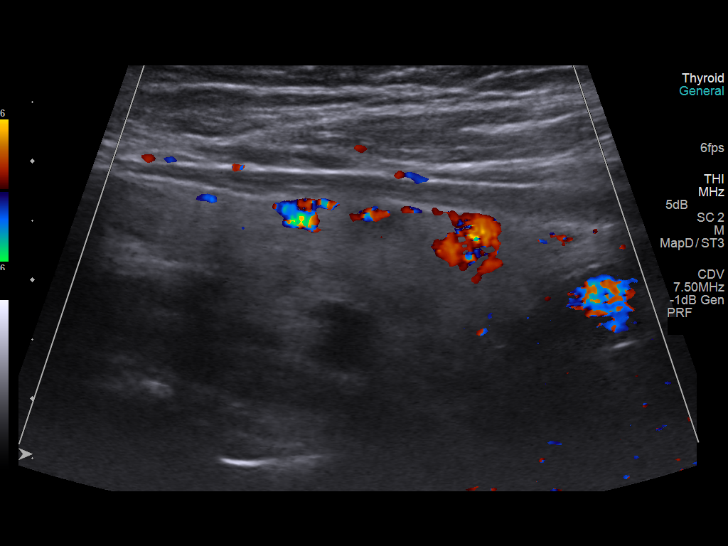
[im 11/41]
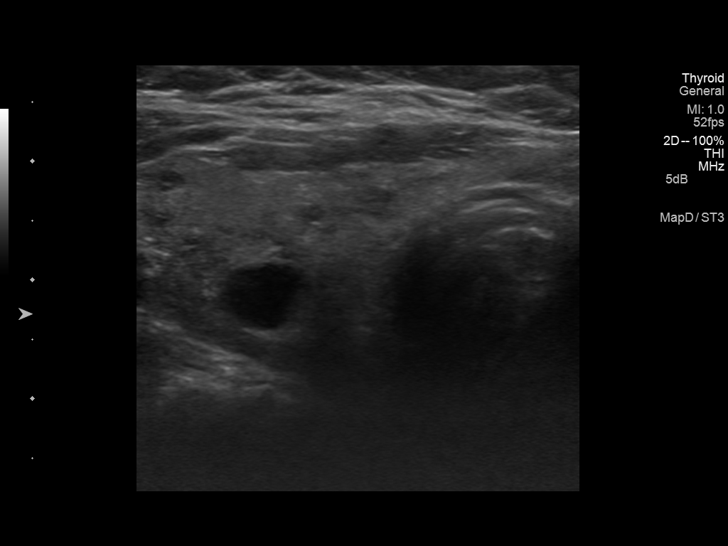
[im 14/41]
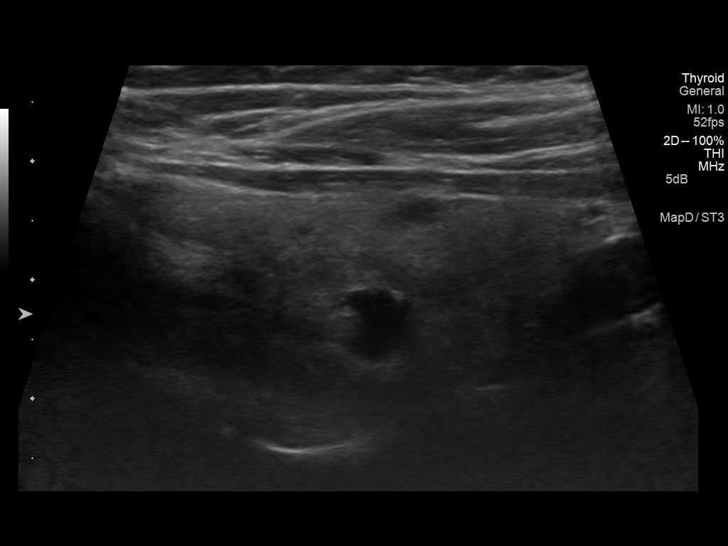
[im 16/41]
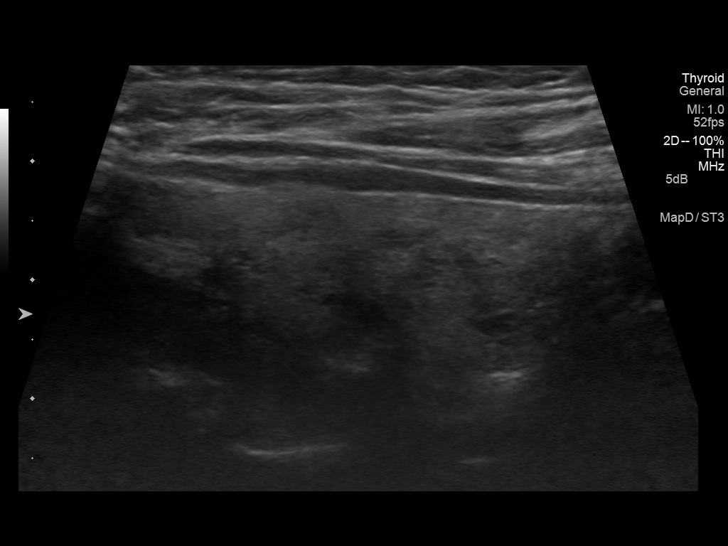
[im 19/41]
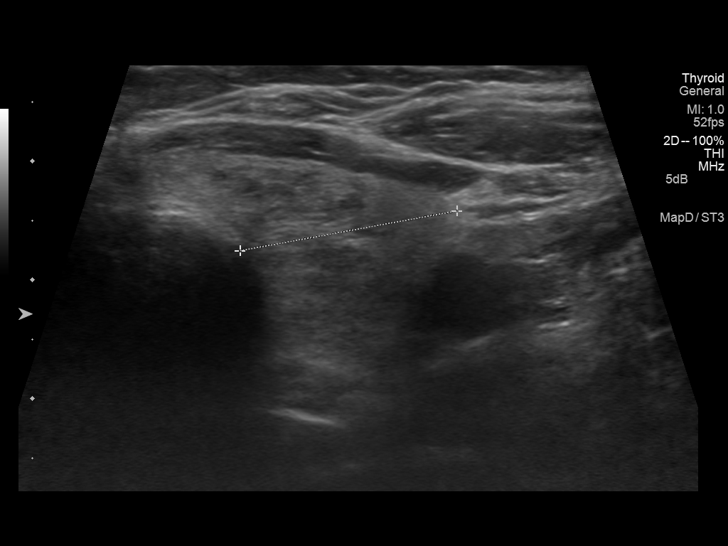
[im 22/41]
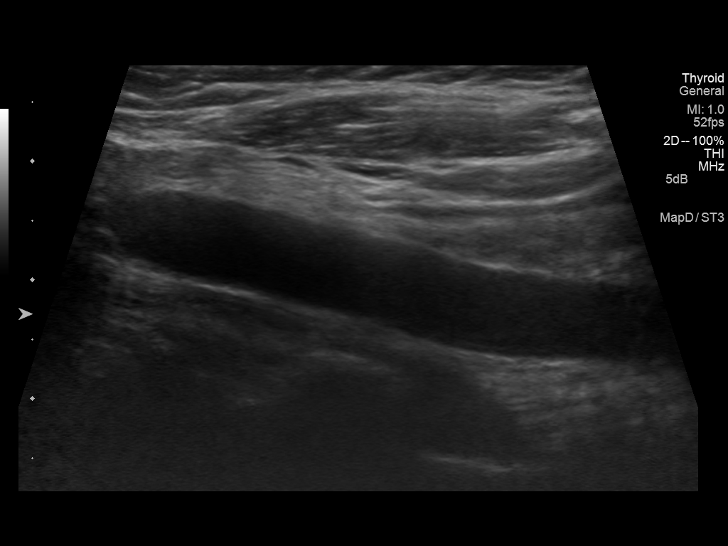
[im 26/41]
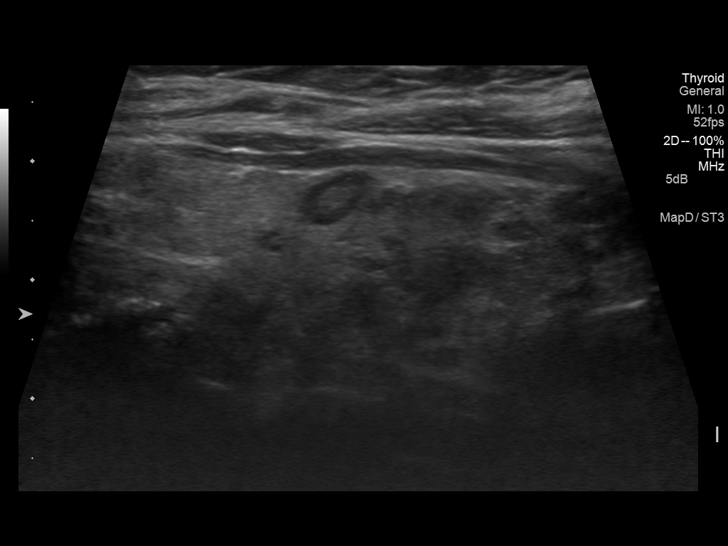
[im 27/41]
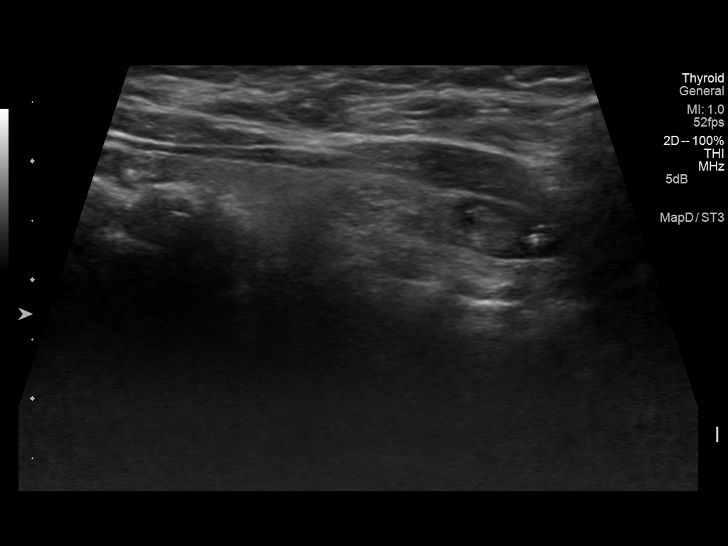
[im 31/41]
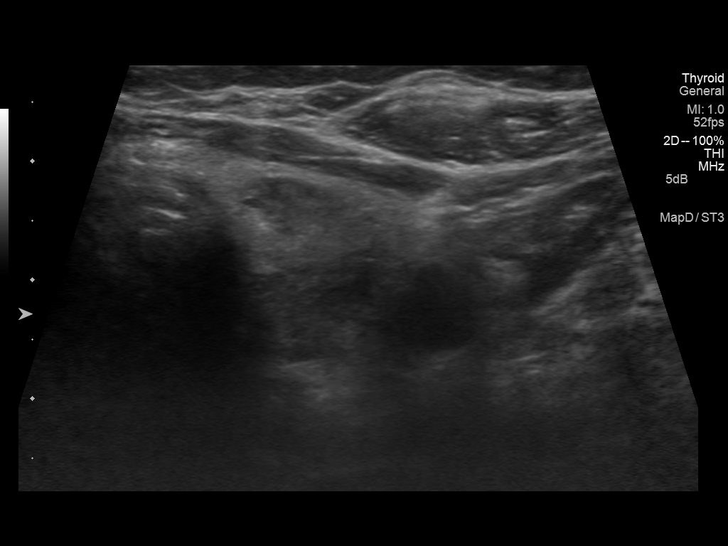
[im 34/41]
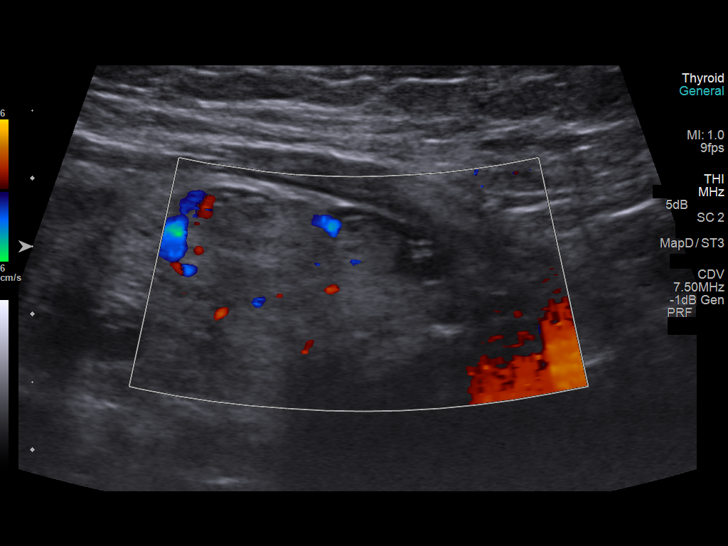
[im 37/41]
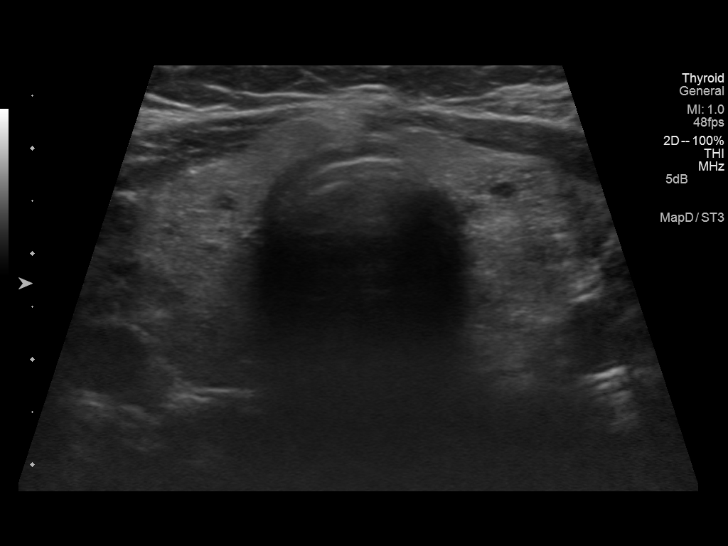
[im 41/41]
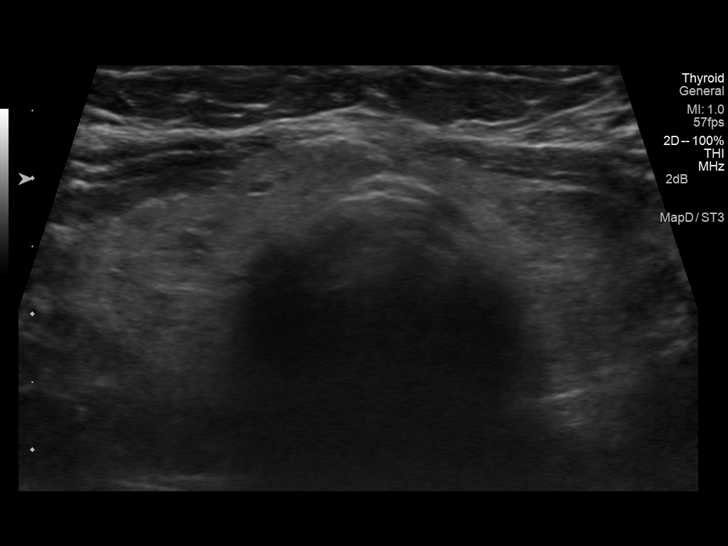

[14 of 25 positions shown; findings below may reference images not displayed]

FINDINGS: Right thyroid lobe

Measurements: 4.5 x 1.8 x 2.0 cm. Hypoechoic posterior mid lobe
nodule measures 8 mm.

Left thyroid lobe

Measurements: 4.8 x 1.8 x 1.9 cm. Ill-defined solid and
heterogeneous left mid lobe nodule measures 1.9 x 1.3 x 1.4 cm.
Complex lower pole nodule measures 10 x 5 x 10 mm.

Isthmus

Thickness: 4 mm.  No nodules visualized.

Lymphadenopathy

None visualized.
IMPRESSION: Bilateral nodules are noted. The dominant nodule measures 1.9 cm in
the left lobe. This may be a pseudo nodule, but does meet criteria
for biopsy. Findings meet consensus criteria for biopsy.
Ultrasound-guided fine needle aspiration should be considered, as
per the consensus statement: Management of Thyroid Nodules Detected
at US: Society of Radiologists in Ultrasound Consensus Conference

## 2014-12-01 NOTE — Addendum Note (Signed)
Addended by: Zannie Cove on: 12/01/2014 11:16 AM   Modules accepted: Orders

## 2014-12-01 NOTE — Progress Notes (Signed)
Lab only 

## 2014-12-02 LAB — HEPATIC FUNCTION PANEL
ALBUMIN: 4.9 g/dL — AB (ref 3.5–4.8)
ALT: 23 IU/L (ref 0–32)
AST: 20 IU/L (ref 0–40)
Alkaline Phosphatase: 75 IU/L (ref 39–117)
BILIRUBIN TOTAL: 0.4 mg/dL (ref 0.0–1.2)
Bilirubin, Direct: 0.09 mg/dL (ref 0.00–0.40)
Total Protein: 7.4 g/dL (ref 6.0–8.5)

## 2014-12-02 LAB — NMR, LIPOPROFILE
Cholesterol: 196 mg/dL (ref 100–199)
HDL Cholesterol by NMR: 64 mg/dL (ref >39)
HDL Particle Number: 38.9 umol/L (ref >=30.5)
LDL Particle Number: 1539 nmol/L — ABNORMAL HIGH (ref <1000)
LDL Size: 20.3 nm (ref >20.5)
LDL-C: 94 mg/dL (ref 0–99)
LP-IR Score: 82 — ABNORMAL HIGH (ref <=45)
Small LDL Particle Number: 731 nmol/L — ABNORMAL HIGH (ref <=527)
TRIGLYCERIDES BY NMR: 188 mg/dL — AB (ref 0–149)

## 2014-12-02 LAB — BMP8+EGFR
BUN / CREAT RATIO: 23 (ref 11–26)
BUN: 15 mg/dL (ref 8–27)
CHLORIDE: 98 mmol/L (ref 97–108)
CO2: 26 mmol/L (ref 18–29)
CREATININE: 0.64 mg/dL (ref 0.57–1.00)
Calcium: 10 mg/dL (ref 8.7–10.3)
GFR calc non Af Amer: 86 mL/min/{1.73_m2} (ref >59)
GFR, EST AFRICAN AMERICAN: 99 mL/min/{1.73_m2} (ref >59)
Glucose: 98 mg/dL (ref 65–99)
Potassium: 4.6 mmol/L (ref 3.5–5.2)
Sodium: 141 mmol/L (ref 134–144)

## 2014-12-02 LAB — VITAMIN D 25 HYDROXY (VIT D DEFICIENCY, FRACTURES): Vit D, 25-Hydroxy: 45.8 ng/mL (ref 30.0–100.0)

## 2014-12-04 ENCOUNTER — Telehealth: Payer: Self-pay

## 2014-12-04 NOTE — Telephone Encounter (Signed)
**Note De-Identified  Obfuscation** Please contact the patient to let her know that I have reviewed the results of her thyroid ultrasound. Please let her know that there is one nodule that may need to be biopsied. Please let her know that I have sent a note to Dr. Tawanna Sat team in [place] asking them to help complete this evaluation with her. If she does not hear from Dr. Laurance Flatten (her primary physician) in a few days, ask her to contact them for further instructions about the evaluation of her thyroid. Please let her know that she should not worry about the findings. This is a routine evaluation.  The pt is advised per Dr Ron Parker. She verbalized understanding. She states that she has a f/u already planned with Dr Laurance Flatten on Tuesday 4/26. I faxed the pts last OV notes with Dr Ron Parker and Thyroid ultrasound to Dr Laurance Flatten through the Epic system.

## 2014-12-05 ENCOUNTER — Other Ambulatory Visit: Payer: Self-pay | Admitting: Nurse Practitioner

## 2014-12-05 DIAGNOSIS — E041 Nontoxic single thyroid nodule: Secondary | ICD-10-CM

## 2014-12-07 ENCOUNTER — Other Ambulatory Visit: Payer: Self-pay | Admitting: Family Medicine

## 2014-12-08 ENCOUNTER — Encounter: Payer: Self-pay | Admitting: Family Medicine

## 2014-12-08 ENCOUNTER — Ambulatory Visit (INDEPENDENT_AMBULATORY_CARE_PROVIDER_SITE_OTHER): Payer: Medicare Other | Admitting: Family Medicine

## 2014-12-08 VITALS — BP 125/66 | HR 64 | Temp 97.2°F | Ht 63.0 in | Wt 145.0 lb

## 2014-12-08 DIAGNOSIS — E785 Hyperlipidemia, unspecified: Secondary | ICD-10-CM | POA: Diagnosis not present

## 2014-12-08 DIAGNOSIS — I10 Essential (primary) hypertension: Secondary | ICD-10-CM | POA: Diagnosis not present

## 2014-12-08 DIAGNOSIS — E559 Vitamin D deficiency, unspecified: Secondary | ICD-10-CM

## 2014-12-08 DIAGNOSIS — I6523 Occlusion and stenosis of bilateral carotid arteries: Secondary | ICD-10-CM

## 2014-12-08 DIAGNOSIS — W4909XA Other specified item causing external constriction, initial encounter: Secondary | ICD-10-CM

## 2014-12-08 DIAGNOSIS — I779 Disorder of arteries and arterioles, unspecified: Secondary | ICD-10-CM

## 2014-12-08 DIAGNOSIS — E041 Nontoxic single thyroid nodule: Secondary | ICD-10-CM | POA: Diagnosis not present

## 2014-12-08 DIAGNOSIS — Z780 Asymptomatic menopausal state: Secondary | ICD-10-CM | POA: Diagnosis not present

## 2014-12-08 DIAGNOSIS — K219 Gastro-esophageal reflux disease without esophagitis: Secondary | ICD-10-CM

## 2014-12-08 DIAGNOSIS — I739 Peripheral vascular disease, unspecified: Secondary | ICD-10-CM

## 2014-12-08 DIAGNOSIS — E8881 Metabolic syndrome: Secondary | ICD-10-CM

## 2014-12-08 NOTE — Progress Notes (Signed)
Subjective:    Patient ID: Melissa Carpenter, female    DOB: 02-18-1934, 81 y.o.   MRN: [id number]  HPI Pt here for follow up and management of chronic medical problems which includes hypertension and hyperlipidemia. She is taking medications regularly. It is of note that the patient recently had carotid Dopplers done in follow-up from previous Dopplers by her cardiologist and some thyroid nodules were noted in the thyroid gland. Subsequently there is a plan to get a thyroid ultrasound for further follow-up of the thyroid nodules. She has in fact had the thyroid ultrasound and a thyroid biopsy is being arranged at this current time. The patient does complain of some increased tendency to clear her throat and has a cough. The patient has not had this done at this time. She has had lab work done and we will review that with her today and have plans to increase the catch a wet from 10/20 to 10/40.       Patient Active Problem List   Diagnosis Date Noted  . Metabolic syndrome 03/27/4817  . Gastroesophageal reflux disease with hiatal hernia 07/21/2013  . Osteoporosis 03/05/2013  . Tachycardia   . Edema   . Shortness of breath   . Carotid artery disease   . Dyslipidemia   . Mitral valve prolapse   . Ejection fraction   . Aortic insufficiency   . Hypertension   . Chest pain    Outpatient Encounter Prescriptions as of 12/08/2014  Medication Sig  . amlodipine-atorvastatin (CADUET) 10-20 MG per tablet TAKE 1 TABLET BY MOUTH DAILY.  Marland Kitchen aspirin EC 81 MG tablet Take 81 mg by mouth daily.  Marland Kitchen BENICAR 40 MG tablet TAKE 1 TABLET BY MOUTH EVERY DAY  . Calcium Carbonate-Vitamin D (CALTRATE 600+D) 600-400 MG-UNIT per tablet Take 1 tablet by mouth daily.  . cholecalciferol (VITAMIN D) 400 UNITS TABS Take 1,000 Units by mouth.    . fish oil-omega-3 fatty acids 1000 MG capsule Take 1 g by mouth daily.    . metoprolol succinate (TOPROL-XL) 100 MG 24 hr tablet TAKE 1 TABLET (100 MG TOTAL) BY MOUTH DAILY. TAKE  WITH OR IMMEDIATELY FOLLOWING A MEAL.  . Multiple Vitamin (MULTIVITAMIN) tablet Take 1 tablet by mouth daily.    . pantoprazole (PROTONIX) 40 MG tablet TAKE 1 TABLET EVERY DAY  . risedronate (ACTONEL) 35 MG tablet TAKE 1 TAB ON EMPTY STOMACH WITH GLASS OF WATER DON'T LIE DOWN OR DRINK/EAT X 30MINS  . [DISCONTINUED] risedronate (ACTONEL) 35 MG tablet TAKE 1 TAB ON EMPTY STOMACH WITH GLASS OF WATER DON'T LIE DOWN OR DRINK/EAT X 30MINS    Review of Systems  Constitutional: Negative.   HENT: Negative.   Eyes: Negative.   Respiratory: Positive for cough ("needs to clear throat" frequently).   Cardiovascular: Negative.   Gastrointestinal: Negative.   Endocrine: Negative.   Genitourinary: Negative.   Musculoskeletal: Negative.   Skin: Negative.   Allergic/Immunologic: Negative.   Neurological: Negative.   Hematological: Negative.   Psychiatric/Behavioral: Negative.        Objective:   Physical Exam  Constitutional: She is oriented to person, place, and time. She appears well-developed and well-nourished. No distress.  The patient is pleasant and alert  HENT:  Head: Normocephalic and atraumatic.  Right Ear: External ear normal.  Left Ear: External ear normal.  Nose: Nose normal.  Mouth/Throat: Oropharynx is clear and moist.  Eyes: Conjunctivae and EOM are normal. Pupils are equal, round, and reactive to light. Right eye exhibits no  discharge. Left eye exhibits no discharge. No scleral icterus.  Neck: Normal range of motion. Neck supple. No thyromegaly present.  No thyromegaly was palpated.  Cardiovascular: Normal rate, regular rhythm, normal heart sounds and intact distal pulses.  Exam reveals no gallop and no friction rub.   No murmur heard. At 60/m  Pulmonary/Chest: Effort normal and breath sounds normal. No respiratory distress. She has no wheezes. She has no rales. She exhibits no tenderness.  Abdominal: Soft. Bowel sounds are normal. She exhibits no mass. There is no  tenderness. There is no rebound and no guarding.  Musculoskeletal: Normal range of motion. She exhibits no edema or tenderness.  Lymphadenopathy:    She has no cervical adenopathy.  Neurological: She is alert and oriented to person, place, and time. She has normal reflexes. No cranial nerve deficit.  Skin: Skin is warm and dry. No rash noted.  Psychiatric: She has a normal mood and affect. Her behavior is normal. Judgment and thought content normal.  Nursing note and vitals reviewed.  BP 125/66 mmHg  Pulse 64  Temp(Src) 97.2 F (36.2 C) (Oral)  Ht 5\' 3"  (1.6 m)  Wt 145 lb (65.772 kg)  BMI 25.69 kg/m2        Assessment & Plan:  1. Essential hypertension -The blood pressure is good today and the patient should continue with current treatment  2. Gastroesophageal reflux disease, esophagitis presence not specified -The patient did have some epigastric tenderness and if the thyroid biopsy is not pursued any further than the biopsy we may consider having her see the gastroenterologist and having an endoscopy done and possibly a dilatation.  3. Hyperlipemia -The total LDL particle number and the triglycerides were elevated. She will consider increasing the Caduet 10/20-to 10/40  4. Vitamin D deficiency -The vitamin D level was good and she should continue current treatment - DG Bone Density; Future  5. Metabolic syndrome -The patient should continue with aggressive therapeutic lifestyle changes which include diet exercise and weight loss  6. Carotid artery disease, unspecified laterality -This will be followed by the cardiologist.  7. Postmenopausal - DG Bone Density; Future  8. Thyroid nodule -A biopsy is being planned - Thyroid Panel With TSH No orders of the defined types were placed in this encounter.   Patient Instructions                       Medicare Annual Wellness Visit  [facility] and the medical providers at [facility] strive to bring  you the best medical care.  In doing so we not only want to address your current medical conditions and concerns but also to detect new conditions early and prevent illness, disease and health-related problems.    Medicare offers a yearly Wellness Visit which allows our clinical staff to assess your need for preventative services including immunizations, lifestyle education, counseling to decrease risk of preventable diseases and screening for fall risk and other medical concerns.    This visit is provided free of charge (no copay) for all Medicare recipients. The clinical pharmacists at [facility] have begun to conduct these Wellness Visits which will also include a thorough review of all your medications.    As you primary medical provider recommend that you make an appointment for your Annual Wellness Visit if you have not done so already this year.  You may set up this appointment before you leave today or you may call back ([phone]) and  schedule an appointment.  Please make sure when you call that you mention that you are scheduling your Annual Wellness Visit with the clinical pharmacist so that the appointment may be made for the proper length of time.     Continue current medications. Continue good therapeutic lifestyle changes which include good diet and exercise. Fall precautions discussed with patient. If an FOBT was given today- please return it to our front desk. If you are over 29 years old - you may need Prevnar 36 or the adult Pneumonia vaccine.  Flu Shots are still available at our office. If you still haven't had one please call to set up a nurse visit to get one.   After your visit with [place] today you will receive a survey in the mail or online from [employer] regarding your care with [place]. Please take a moment to fill this out. Your feedback is very important to [place] as you can help [place] better understand your patient needs as well as improve your experience and  satisfaction. WE CARE ABOUT YOU!!!   The patient should follow through with the thyroid biopsy as planned and she'll get back in touch with [place] after the results are obtained She should follow up with the cardiologist as planned in August She should count the rest of her Caduet pills-and call us so that if necessary we can add atorvastatin 20 to the current Caduet 10/20 She should continue with aggressive therapeutic lifestyle changes If she continues to have trouble with strangling and clearing the throat she may need a repeat endoscopy scheduled   Arrie Senate MD  9. Strangling, initial encounter -This is an ongoing problem and an endoscopy may be arranged following the biopsy and the thyroid nodule evaluation.  Arrie Senate MD

## 2014-12-08 NOTE — Patient Instructions (Addendum)
Medicare Annual Wellness Visit  [facility] and the medical providers at [facility] strive to bring you the best medical care.  In doing so we not only want to address your current medical conditions and concerns but also to detect new conditions early and prevent illness, disease and health-related problems.    Medicare offers a yearly Wellness Visit which allows our clinical staff to assess your need for preventative services including immunizations, lifestyle education, counseling to decrease risk of preventable diseases and screening for fall risk and other medical concerns.    This visit is provided free of charge (no copay) for all Medicare recipients. The clinical pharmacists at [facility] have begun to conduct these Wellness Visits which will also include a thorough review of all your medications.    As you primary medical provider recommend that you make an appointment for your Annual Wellness Visit if you have not done so already this year.  You may set up this appointment before you leave today or you may call back ([phone]) and schedule an appointment.  Please make sure when you call that you mention that you are scheduling your Annual Wellness Visit with the clinical pharmacist so that the appointment may be made for the proper length of time.     Continue current medications. Continue good therapeutic lifestyle changes which include good diet and exercise. Fall precautions discussed with patient. If an FOBT was given today- please return it to our front desk. If you are over 79 years old - you may need Prevnar 69 or the adult Pneumonia vaccine.  Flu Shots are still available at our office. If you still haven't had one please call to set up a nurse visit to get one.   After your visit with [place] today you will receive a survey in the mail or online from [employer] regarding your care with [place]. Please take a moment to  fill this out. Your feedback is very important to [place] as you can help [place] better understand your patient needs as well as improve your experience and satisfaction. WE CARE ABOUT YOU!!!   The patient should follow through with the thyroid biopsy as planned and she'll get back in touch with [place] after the results are obtained She should follow up with the cardiologist as planned in August She should count the rest of her Caduet pills-and call us so that if necessary we can add atorvastatin 20 to the current Caduet 10/20 She should continue with aggressive therapeutic lifestyle changes If she continues to have trouble with strangling and clearing the throat she may need a repeat endoscopy scheduled

## 2014-12-09 LAB — THYROID PANEL WITH TSH
FREE THYROXINE INDEX: 2.3 (ref 1.2–4.9)
T3 Uptake Ratio: 31 % (ref 24–39)
T4 TOTAL: 7.5 ug/dL (ref 4.5–12.0)
TSH: 1.39 u[IU]/mL (ref 0.450–4.500)

## 2014-12-15 ENCOUNTER — Encounter (HOSPITAL_COMMUNITY): Payer: Self-pay

## 2014-12-15 ENCOUNTER — Ambulatory Visit (HOSPITAL_COMMUNITY)
Admission: RE | Admit: 2014-12-15 | Discharge: 2014-12-15 | Disposition: A | Discharge status: Home / Self Care | Payer: Medicare Other | Source: Ambulatory Visit | Attending: Nurse Practitioner | Admitting: Nurse Practitioner

## 2014-12-15 DIAGNOSIS — E041 Nontoxic single thyroid nodule: Secondary | ICD-10-CM | POA: Diagnosis not present

## 2014-12-15 IMAGING — US US THYROID BIOPSY
1 series · 10 of 10 positions shown · non-contrast
Comparison: None.

CLINICAL DATA: Left dominant thyroid nodule

EXAM:
ULTRASOUND GUIDED NEEDLE ASPIRATE BIOPSY OF THE THYROID GLAND

[Series 1: us thyroid biopsy · 0.04mm/px · 10 acquisitions, 10 frames shown]
[im 1/10]
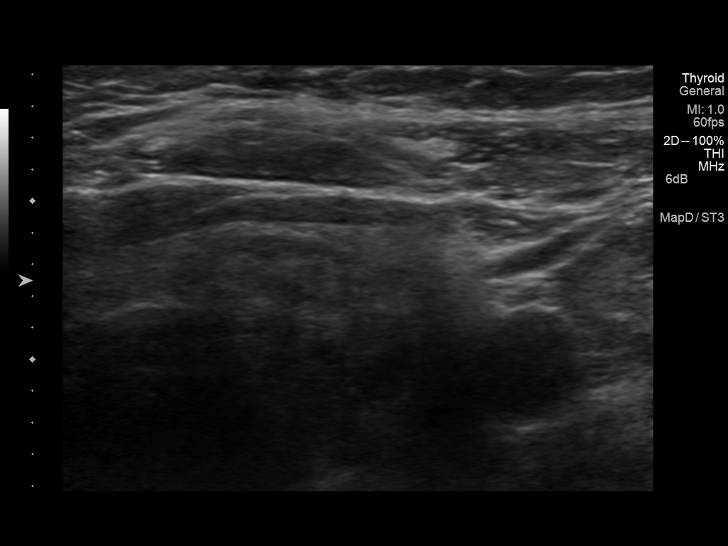
[im 2/10]
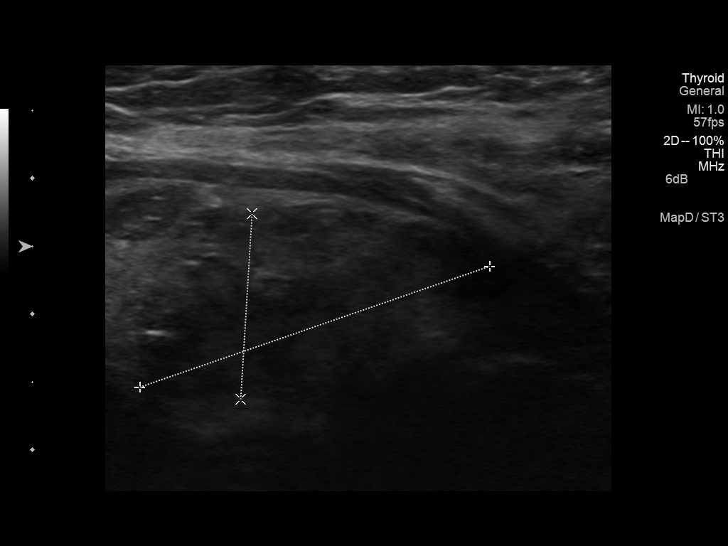
[im 3/10]
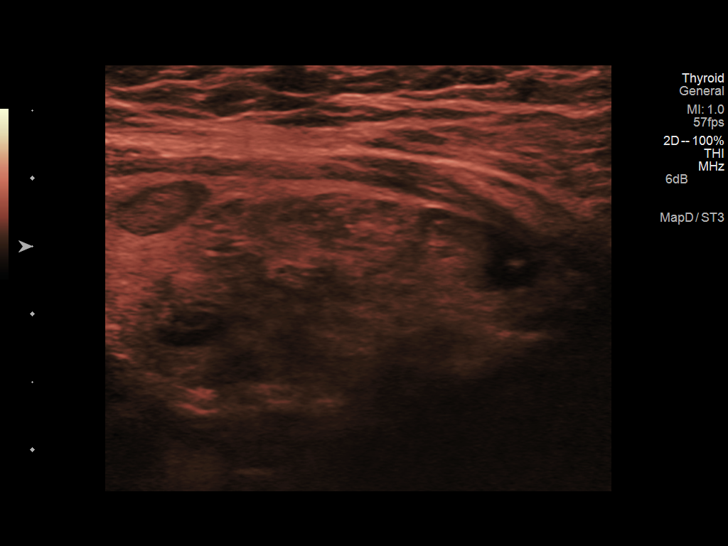
[im 4/10]
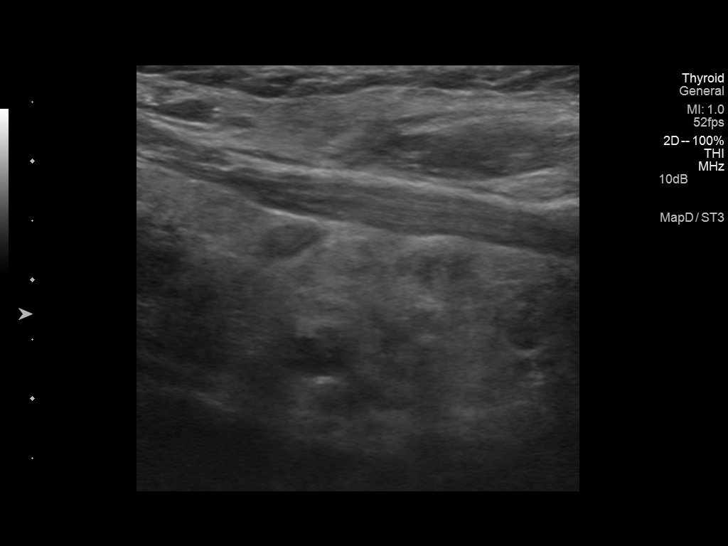
[im 5/10]
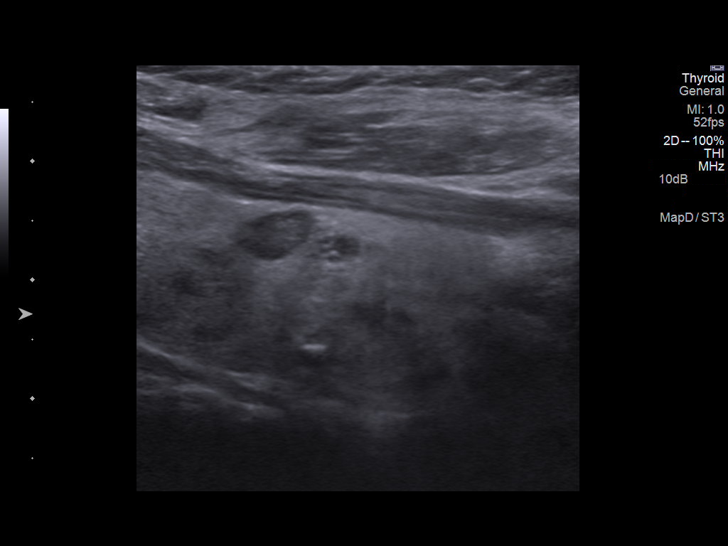
[im 6/10]
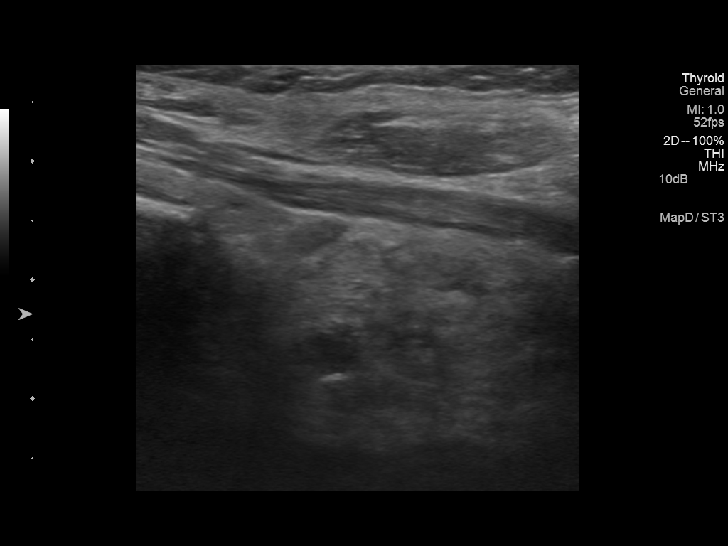
[im 7/10]
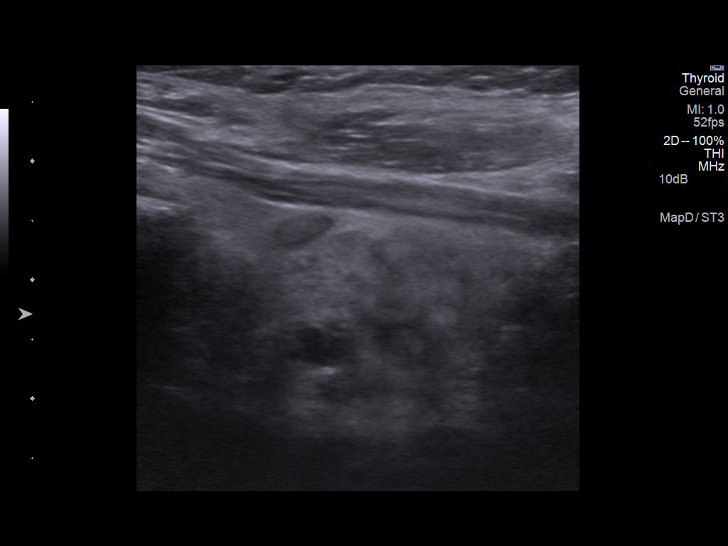
[im 8/10]
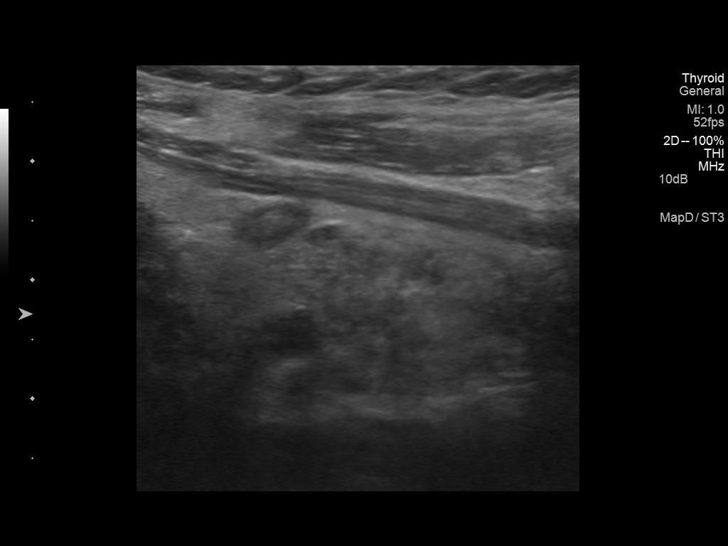
[im 9/10]
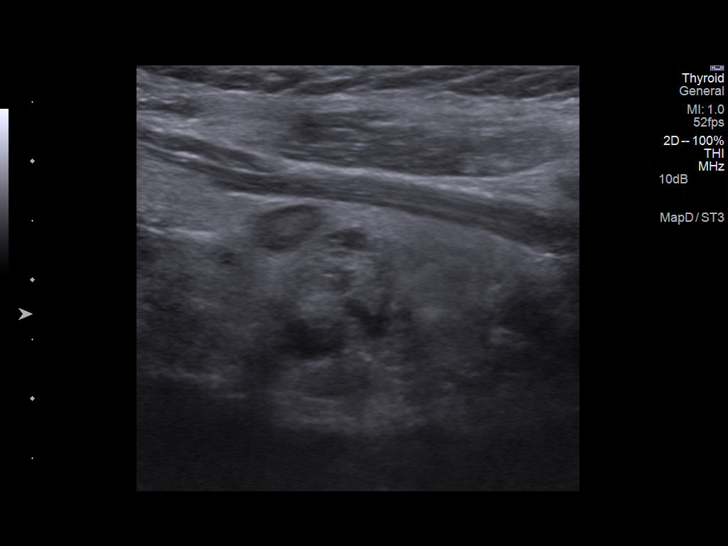
[im 10/10]
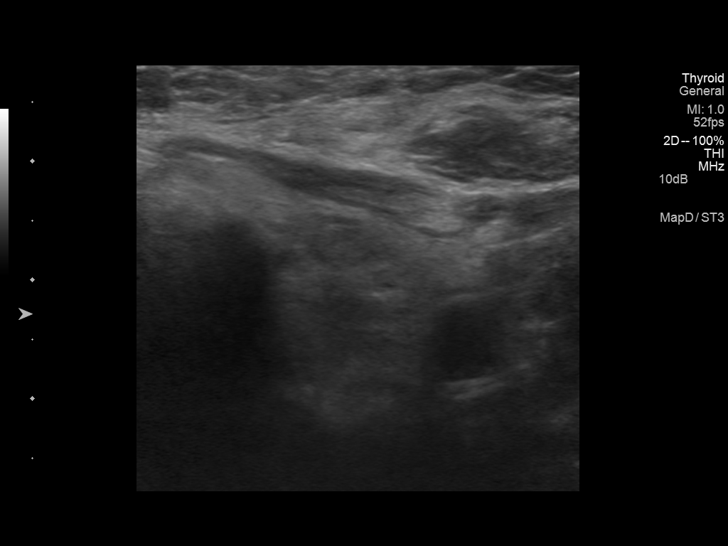

[10 of 10 positions shown; findings below may reference images not displayed]

PROCEDURE:
Thyroid biopsy was thoroughly discussed with the patient and
questions were answered. The benefits, risks, alternatives, and
complications were also discussed. The patient understands and
wishes to proceed with the procedure. Written consent was obtained.

Ultrasound was performed to localize and mark an adequate site for
the biopsy. The patient was then prepped and draped in a normal
sterile fashion. Local anesthesia was provided with 1% lidocaine.
Using direct ultrasound guidance, 3 passes were made using needles
into the nodule within the left lobe of the thyroid. Ultrasound was
used to confirm needle placements on all occasions. Specimens were
sent to Pathology for analysis.

Complications:  None
FINDINGS: As above
IMPRESSION: Ultrasound guided needle aspirate biopsy performed of the left
thyroid nodule.

## 2014-12-15 MED ORDER — LIDOCAINE HCL (PF) 2 % IJ SOLN
INTRAMUSCULAR | Status: AC
  Administered 2014-12-15: 10 mL
  Filled 2014-12-15: qty 10

## 2014-12-15 NOTE — Discharge Instructions (Signed)
Thyroid Biopsy °The thyroid gland is a butterfly-shaped gland situated in the front of the neck. It produces hormones which affect metabolism, growth and development, and body temperature. A thyroid biopsy is a procedure in which small samples of tissue or fluid are removed from the thyroid gland or mass and examined under a microscope. This test is done to determine the cause of thyroid problems, such as infection, cancer, or other thyroid problems. °There are 2 ways to obtain samples: °1. Fine needle biopsy. Samples are removed using a thin needle inserted through the skin and into the thyroid gland or mass. °2. Open biopsy. Samples are removed after a cut (incision) is made through the skin. °LET YOUR CAREGIVER KNOW ABOUT:  °· Allergies. °· Medications taken including herbs, eye drops, over-the-counter medications, and creams. °· Use of steroids (by mouth or creams). °· Previous problems with anesthetics or numbing medicine. °· Possibility of pregnancy, if this applies. °· History of blood clots (thrombophlebitis). °· History of bleeding or blood problems. °· Previous surgery. °· Other health problems. °RISKS AND COMPLICATIONS °· Bleeding from the site. The risk of bleeding is higher if you have a bleeding disorder or are taking any blood thinning medications (anticoagulants). °· Infection. °· Injury to structures near the thyroid gland. °BEFORE THE PROCEDURE  °This is a procedure that can be done as an outpatient. Confirm the time that you need to arrive for your procedure. Confirm whether there is a need to fast or withhold any medications. A blood sample may be done to determine your blood clotting time. Medicine may be given to help you relax (sedative). °PROCEDURE °Fine needle biopsy. °You will be awake during the procedure. You may be asked to lie on your back with your head tipped backward to extend your neck. Let your caregiver know if you cannot tolerate the positioning. An area on your neck will be  cleansed. A needle is inserted through the skin of your neck. You may feel a mild discomfort during this procedure. You may be asked to avoid coughing, talking, swallowing, or making sounds during some portions of the procedure. The needle is withdrawn once tissue or fluid samples have been removed. Pressure may be applied to the neck to reduce swelling and ensure that bleeding has stopped. The samples will be sent for examination.  °Open biopsy. °You will be given general anesthesia. You will be asleep during the procedure. An incision is made in your neck. A sample of thyroid tissue or the mass is removed. The tissue sample or mass will be sent for examination. The sample or mass may be examined during the biopsy. If the sample or mass contains cancer cells, some or all of the thyroid gland may be removed. The incision is closed with stitches. °AFTER THE PROCEDURE  °Your recovery will be assessed and monitored. If there are no problems, as an outpatient, you should be able to go home shortly after the procedure. °If you had a fine needle biopsy: °· You may have soreness at the biopsy site for 1 to 2 days. °If you had an open biopsy:  °· You may have soreness at the biopsy site for 3 to 4 days. °· You may have a hoarse voice or sore throat for 1 to 2 days. °Obtaining the Test Results °It is your responsibility to obtain your test results. Do not assume everything is normal if you have not heard from your caregiver or the medical facility. It is important for you to follow up   on all of your test results. °HOME CARE INSTRUCTIONS  °· Keeping your head raised on a pillow when you are lying down may ease biopsy site discomfort. °· Supporting the back of your head and neck with both hands as you sit up from a lying position may ease biopsy site discomfort. °· Only take over-the-counter or prescription medicines for pain, discomfort, or fever as directed by your caregiver. °· Throat lozenges or gargling with warm salt  water may help to soothe a sore throat. °SEEK IMMEDIATE MEDICAL CARE IF:  °· You have severe bleeding from the biopsy site. °· You have difficulty swallowing. °· You have a fever. °· You have increased pain, swelling, redness, or warmth at the biopsy site. °· You notice pus coming from the biopsy site. °· You have swollen glands (lymph nodes) in your neck. °Document Released: 05/28/2007 Document Revised: 11/25/2012 Document Reviewed: 10/23/2013 °[employer]® Patient Information ©2015 [employer]. This information is not intended to replace advice given to you by your health care provider. Make sure you discuss any questions you have with your health care provider. ° °

## 2014-12-22 ENCOUNTER — Telehealth: Payer: Self-pay | Admitting: Family Medicine

## 2014-12-22 NOTE — Telephone Encounter (Signed)
Informed pt biopsy was benign.

## 2014-12-24 DIAGNOSIS — Z961 Presence of intraocular lens: Secondary | ICD-10-CM | POA: Diagnosis not present

## 2014-12-24 DIAGNOSIS — D3131 Benign neoplasm of right choroid: Secondary | ICD-10-CM | POA: Diagnosis not present

## 2014-12-24 DIAGNOSIS — E119 Type 2 diabetes mellitus without complications: Secondary | ICD-10-CM | POA: Diagnosis not present

## 2014-12-24 DIAGNOSIS — H04123 Dry eye syndrome of bilateral lacrimal glands: Secondary | ICD-10-CM | POA: Diagnosis not present

## 2014-12-24 DIAGNOSIS — H10413 Chronic giant papillary conjunctivitis, bilateral: Secondary | ICD-10-CM | POA: Diagnosis not present

## 2014-12-24 LAB — HM DIABETES EYE EXAM

## 2015-01-01 DIAGNOSIS — M1611 Unilateral primary osteoarthritis, right hip: Secondary | ICD-10-CM | POA: Diagnosis not present

## 2015-01-01 DIAGNOSIS — M7061 Trochanteric bursitis, right hip: Secondary | ICD-10-CM | POA: Diagnosis not present

## 2015-01-15 ENCOUNTER — Encounter: Payer: Self-pay | Admitting: *Deleted

## 2015-01-26 ENCOUNTER — Other Ambulatory Visit: Payer: Self-pay | Admitting: Family Medicine

## 2015-01-26 ENCOUNTER — Encounter: Payer: Self-pay | Admitting: *Deleted

## 2015-02-01 DIAGNOSIS — Z1231 Encounter for screening mammogram for malignant neoplasm of breast: Secondary | ICD-10-CM | POA: Diagnosis not present

## 2015-02-02 DIAGNOSIS — Z1231 Encounter for screening mammogram for malignant neoplasm of breast: Secondary | ICD-10-CM | POA: Diagnosis not present

## 2015-02-10 ENCOUNTER — Encounter: Payer: Self-pay | Admitting: Family Medicine

## 2015-02-23 ENCOUNTER — Encounter: Payer: Self-pay | Admitting: Nurse Practitioner

## 2015-02-23 ENCOUNTER — Ambulatory Visit (INDEPENDENT_AMBULATORY_CARE_PROVIDER_SITE_OTHER): Payer: Medicare Other | Admitting: Nurse Practitioner

## 2015-02-23 DIAGNOSIS — Z01419 Encounter for gynecological examination (general) (routine) without abnormal findings: Secondary | ICD-10-CM

## 2015-02-23 LAB — POCT UA - MICROSCOPIC ONLY
CASTS, UR, LPF, POC: NEGATIVE
Crystals, Ur, HPF, POC: NEGATIVE
Mucus, UA: NEGATIVE
RBC, urine, microscopic: NEGATIVE
YEAST UA: NEGATIVE

## 2015-02-23 LAB — POCT URINALYSIS DIPSTICK
BILIRUBIN UA: NEGATIVE
GLUCOSE UA: NEGATIVE
Ketones, UA: NEGATIVE
Nitrite, UA: NEGATIVE
PH UA: 6.5
Protein, UA: NEGATIVE
Spec Grav, UA: <=1.005
UROBILINOGEN UA: NEGATIVE

## 2015-02-23 NOTE — Progress Notes (Signed)
   Subjective:    Patient ID: Melissa Carpenter, female    DOB: 18-Apr-1934, 81 y.o.   MRN: [id number]  HPI Patient in today for Pap and breast exam. SHe is a regular patient of Dr. Laurance Flatten who was just seen last month for regular follow up exam. She is doing well today without complaints.    Review of Systems  Constitutional: Negative.   HENT: Negative.   Respiratory: Negative.   Cardiovascular: Negative.   Genitourinary: Negative.   Neurological: Negative.   Psychiatric/Behavioral: Negative.   All other systems reviewed and are negative.      Objective:   Physical Exam  Constitutional: She is oriented to person, place, and time. She appears well-developed and well-nourished.  HENT:  Head: Normocephalic.  Right Ear: Hearing, tympanic membrane, external ear and ear canal normal.  Left Ear: Hearing, tympanic membrane, external ear and ear canal normal.  Nose: Nose normal.  Mouth/Throat: Uvula is midline and oropharynx is clear and moist.  Eyes: Conjunctivae and EOM are normal. Pupils are equal, round, and reactive to light.  Neck: Normal range of motion and full passive range of motion without pain. Neck supple. No JVD present. Carotid bruit is not present. No thyroid mass and no thyromegaly present.  Cardiovascular: Normal rate, normal heart sounds and intact distal pulses.   No murmur heard. Pulmonary/Chest: Effort normal and breath sounds normal. Right breast exhibits no inverted nipple, no mass, no nipple discharge, no skin change and no tenderness. Left breast exhibits no inverted nipple, no mass, no nipple discharge, no skin change and no tenderness.  Abdominal: Soft. Bowel sounds are normal. She exhibits no mass. There is no tenderness.  Genitourinary: Vagina normal and uterus normal. No breast swelling, tenderness, discharge or bleeding.  bimanual exam-No adnexal masses or tenderness. Vaginal cuff intact  Musculoskeletal: Normal range of motion.  Lymphadenopathy:    She has no  cervical adenopathy.  Neurological: She is alert and oriented to person, place, and time.  Skin: Skin is warm and dry.  Psychiatric: She has a normal mood and affect. Her behavior is normal. Judgment and thought content normal.    BP 149/67 mmHg  Pulse 69  Temp(Src) 97.4 F (36.3 C) (Oral)  Ht 5\' 3"  (1.6 m)  Wt 150 lb (68.04 kg)  BMI 26.58 kg/m2       Assessment & Plan:   1. Encounter for routine gynecological examination    Continue all meds Keep follow up appointment with Dr. Mayra Neer, FNP

## 2015-02-23 NOTE — Patient Instructions (Signed)

## 2015-03-08 ENCOUNTER — Ambulatory Visit (INDEPENDENT_AMBULATORY_CARE_PROVIDER_SITE_OTHER): Payer: Medicare Other

## 2015-03-08 ENCOUNTER — Ambulatory Visit (INDEPENDENT_AMBULATORY_CARE_PROVIDER_SITE_OTHER): Payer: Medicare Other | Admitting: Pharmacist

## 2015-03-08 ENCOUNTER — Encounter: Payer: Self-pay | Admitting: Pharmacist

## 2015-03-08 VITALS — BP 120/64 | HR 68 | Ht 60.25 in | Wt 150.0 lb

## 2015-03-08 DIAGNOSIS — Z0000 Encounter for general adult medical examination without abnormal findings: Secondary | ICD-10-CM | POA: Diagnosis not present

## 2015-03-08 DIAGNOSIS — Z780 Asymptomatic menopausal state: Secondary | ICD-10-CM | POA: Diagnosis not present

## 2015-03-08 DIAGNOSIS — M810 Age-related osteoporosis without current pathological fracture: Secondary | ICD-10-CM

## 2015-03-08 DIAGNOSIS — I10 Essential (primary) hypertension: Secondary | ICD-10-CM

## 2015-03-08 DIAGNOSIS — E559 Vitamin D deficiency, unspecified: Secondary | ICD-10-CM

## 2015-03-08 MED ORDER — RISEDRONATE SODIUM 35 MG PO TABS: ORAL_TABLET | ORAL | Status: DC

## 2015-03-08 NOTE — Progress Notes (Signed)
Patient ID: [name], female   DOB: March 05, 1934, 81 y.o.   MRN: [id number]    Subjective:   Melissa Carpenter is a 81 y.o. female who presents for an Initial Medicare Annual Wellness Visit.  We are also rechecking DEXA today.  Patient has a history of osteoporosis and is currently taking Actonel 35mg  1 tablet weekly.  She had taken Actonel in past for 5 years and it was stopped.  She tried Forteo but stopped due to weakness and increased calcium.  She has also tried Evista but stopped because was having eye problems which she thought was related to Evista.   Melissa Carpenter is retired.  She lives with her husbands.  Current Medications (verified) Outpatient Encounter Prescriptions as of 03/08/2015  Medication Sig  . amlodipine-atorvastatin (CADUET) 10-20 MG per tablet TAKE 1 TABLET BY MOUTH DAILY.  Marland Kitchen aspirin EC 81 MG tablet Take 81 mg by mouth daily.  Marland Kitchen BENICAR 40 MG tablet TAKE 1 TABLET BY MOUTH EVERY DAY  . Calcium Carbonate-Vitamin D (CALTRATE 600+D) 600-400 MG-UNIT per tablet Take 1 tablet by mouth daily.  . cholecalciferol (VITAMIN D) 400 UNITS TABS Take 1,000 Units by mouth.    . fish oil-omega-3 fatty acids 1000 MG capsule Take 1 g by mouth daily.    . metoprolol succinate (TOPROL-XL) 100 MG 24 hr tablet TAKE 1 TABLET (100 MG TOTAL) BY MOUTH DAILY. TAKE WITH OR IMMEDIATELY FOLLOWING A MEAL.  . Multiple Vitamin (MULTIVITAMIN) tablet Take 1 tablet by mouth daily.    . pantoprazole (PROTONIX) 40 MG tablet TAKE 1 TABLET EVERY DAY  . [DISCONTINUED] risedronate (ACTONEL) 35 MG tablet TAKE 1 TAB ON EMPTY STOMACH WITH GLASS OF WATER DON'T LIE DOWN OR DRINK/EAT X 30MINS (Patient taking differently: TAKE 1 TAB WEEKLY ON EMPTY STOMACH WITH GLASS OF WATER DON'T LIE DOWN OR DRINK/EAT X 30MINS)  . [DISCONTINUED] risedronate (ACTONEL) 35 MG tablet TAKE 1 TAB WEEKLY ON EMPTY STOMACH WITH GLASS OF WATER DON'T LIE DOWN OR DRINK/EAT X 30MINS   No facility-administered encounter medications on file as of 03/08/2015.      Allergies (verified) Forteo; Codeine; Evista; Morphine; Penicillins; Sulfites; and Sulfonamide derivatives   History: Past Medical History  Diagnosis Date  . Shortness of breath   . Carotid artery disease     , Doppler, 2003  . Dyslipidemia   . Mitral valve prolapse     Echo, 2009, very mild intermittent prolapse of the posterior leaflet, no MR  . Ejection fraction     EF 60-70%, echo, December, 2009 / EF 55-60%, December, 2012  . Aortic insufficiency     Mild, echo, December, 2009  . Hypertension   . DJD (degenerative joint disease)   . Chest pain     Catheterization, 2003, no significant CAD  . Edema     November, 2012  . Diastolic dysfunction     Mild diastolic dysfunction, echo, December, 2012  . Tachycardia     Nighttime tachycardia, February, 2014  . Cataract   . GERD (gastroesophageal reflux disease)   . Hyperlipidemia   . Osteoporosis   . Thyroid nodule    Past Surgical History  Procedure Laterality Date  . Cardiac catheterization  sept 2003    no significant cad  . Appendectomy    . Tonsillectomy    . Hysterectomy--unknown type    . Eye surgery    . Biopsy thyroid Left   . Abdominal hysterectomy    . Nasal sinus surgery  Family History  Problem Relation Age of Onset  . Heart attack Mother   . Cancer Mother     THROAT / VOCAL CORD  . Hypertension Mother   . Hypertension Father   . Bone cancer Sister   . Stroke Sister   . Lung cancer Grandchild   . Heart attack Brother   . Cirrhosis Brother   . Cancer Sister     BREAST / BONE  . Hyperlipidemia Sister   . Heart attack Sister    Social History   Occupational History  . Not on file.   Social History Main Topics  . Smoking status: Never Smoker   . Smokeless tobacco: Never Used  . Alcohol Use: No  . Drug Use: No  . Sexual Activity: No    Do you feel safe at home?  Yes  Dietary issues and exercise activities: Current Exercise Habits:: The patient does not participate in regular  exercise at present  Current Dietary habits:  No following any particular dietary plans at current time   Objective:    Today's Vitals   03/08/15 0836  BP: 120/64  Pulse: 68  Height: 5' 0.25" (1.53 m)  Weight: 150 lb (68.04 kg)   Max Adult Heigth = 5\' 3"   Body mass index is 29.07 kg/(m^2).   DEXA Results Date of Test T-Score for AP Spine L1-L4 T-Score for Total Left Hip T-Score for Total Right Hip  03/08/2015 -2.2 -0.4 -0.8  03/05/2013 -2.3 -0.4 -0.8  03/01/2011 -2.4 -1.0 -1.3  06/22/2010 -3.1 -1.0 -1.3    Activities of Daily Living In your present state of health, do you have any difficulty performing the following activities: 03/08/2015  Hearing? N  Vision? N  Difficulty concentrating or making decisions? N  Walking or climbing stairs? N  Dressing or bathing? N  Doing errands, shopping? N  Preparing Food and eating ? N  Using the Toilet? N  In the past six months, have you accidently leaked urine? Y  Do you have problems with loss of bowel control? N  Managing your Medications? N  Managing your Finances? N  Housekeeping or managing your Housekeeping? N    Are there smokers in your home (other than you)? No   Cardiac Risk Factors include: advanced age (>78men, >70 women);dyslipidemia;family history of premature cardiovascular disease;hypertension  Depression Screen PHQ 2/9 Scores 03/08/2015 12/08/2014 09/02/2014 07/27/2014  PHQ - 2 Score 0 0 0 0    Fall Risk Fall Risk  03/08/2015 12/08/2014 09/02/2014 07/27/2014 03/24/2014  Falls in the past year? Yes Yes No No No  Number falls in past yr: 2 or more 2 or more - - -  Injury with Fall? No No - - -  Risk Factor Category  High Fall Risk - - - -  Follow up Falls prevention discussed;Education provided - - - -    Cognitive Function: MMSE - Mini Mental State Exam 03/08/2015  Orientation to time 5  Orientation to Place 5  Registration 3  Attention/ Calculation 5  Recall 3  Language- name 2 objects 2  Language-  repeat 1  Language- follow 3 step command 3  Language- read & follow direction 1  Write a sentence 1  Copy design 1  Total score 30    Immunizations and Health Maintenance Immunization History  Administered Date(s) Administered  . Influenza,inj,Quad PF,36+ Mos 05/26/2013, 05/25/2014  . Pneumococcal Conjugate-13 07/30/2013  . Pneumococcal Polysaccharide-23 09/14/2004  . Tdap 05/15/2011   Health Maintenance Due  Topic Date  Due  . DEXA SCAN  02/12/2015    Patient Care Team: Chipper Herb, MD as PCP - General (Family Medicine)  Indicate any recent Medical Services you may have received from other than [facility] providers in the past year (date may be approximate).    Assessment:    Annual Wellness Visit  HTN - at goal today Osteoporosis - stable BMD but has been taking Actonel for 3 years (and had taken for over 5 years from 2006 to 2011)    Screening Tests Health Maintenance  Topic Date Due  . DEXA SCAN  02/12/2015  . INFLUENZA VACCINE  03/15/2015  . COLONOSCOPY  05/15/2019  . TETANUS/TDAP  05/14/2021  . ZOSTAVAX  Completed  . PNA vac Low Risk Adult  Completed        Plan:   During the course of the visit Melissa Carpenter was educated and counseled about the following appropriate screening and preventive services:   Vaccines to include Pneumoccal, Influenza, Hepatitis B, Td, Zostavax - required vaccines UTD  Colorectal cancer screening - FOBT and colonoscopy UTD  Cardiovascular disease screening - UTD;  Recently saw Dr Ron Parker her cardiologist who repeated carotid duplex;  BP at goal today;  Last lipid panel showed LDL at goal but Tg slightly elevated at 188.  Discussed dietary changes to help with lowering Triglycerides.  Diabetes screening - UTD; last FBG was 98  Bone Denisty / Osteoporosis Screening - done today  Mammogram - UTD  PAP - no longer required;  Patient is reminded that if she had any bleeding, discharge or pain to contact office.  Glaucoma screening / Eye  Exam - UTD  Nutrition counseling - Discussed limiting high CHO and sugar foods to help with both weight and triglycerides.  Advanced Directives - UTD; copy requested  Physical Activity - discussed increasing activity - handout for chair exercises given and reviewed.  She is interested in cutting back on pantoprazole if possible.  Advised to try to take pantoprazole every other day for a few weeks and then try to stop completely.  May use Tums (no more than 2 per day) or zantac or pepcid as needed for heartburn.    Hold Actonel and recheck DEXA in 2 year.   Continue current calcium intake from calcium 600mg  qd and MVI qd (patient takes at separate times)  Fall prevention discussed.   Patient Instructions (the written plan) were given to the patient.   Cherre Robins, [facility]   03/08/2015

## 2015-03-08 NOTE — Patient Instructions (Signed)
Melissa Carpenter , Thank you for taking time to come for your Medicare Wellness Visit. I appreciate your ongoing commitment to your health goals. Please review the following plan we discussed and let me know if I can assist you in the future.   These are the goals we discussed: 1.  Try to limit high sugar and carbohydrate containing foods.    Increase non-starchy vegetables - carrots, green bean, squash, zucchini, tomatoes, onions, peppers, spinach and other green leafy vegetables, cabbage, lettuce,  cucumbers, asparagus, okra (not fried), eggplant  Limit sugar and processed foods (cakes, cookies, ice cream, crackers and chips)  Increase fresh fruit but limit serving sizes 1/2 cup or about the size of tennis or baseball  Limit red meat to no more than 1-2 times per week (serving size about the size of your palm)  Choose whole grains / lean proteins - whole wheat bread, quinoa, whole grain rice (1/2 cup), fish, chicken, [place] 2.  Increase activity - try to do chair exercises from handout provided daily. 3.  Try to limit use of pantoprazole 4m.  You can try taking every other day for a few weeks.  If you have heartburn / GERD you can take tums (no more than 2 a day) or over the counter zantac or pepcid.     This is a list of the screening recommended for you and due dates:  Health Maintenance  Topic Date Due  . DEXA scan (bone density measurement)  03/07/2017  . Flu Shot  03/15/2015  . Colon Cancer Screening  05/15/2019  . Tetanus Vaccine  05/14/2021  . Shingles Vaccine  Completed  . Pneumonia vaccines  Completed     Preventive Care for Adults A healthy lifestyle and preventive care can promote health and wellness. Preventive health guidelines for women include the following key practices.  A routine yearly physical is a good way to check with your health care provider about your health and preventive screening. It is a chance to share any concerns and updates on your health and to receive a  thorough exam.  Visit your dentist for a routine exam and preventive care every 6 months. Brush your teeth twice a day and floss once a day. Good oral hygiene prevents tooth decay and gum disease.  The frequency of eye exams is based on your age, health, family medical history, use of contact lenses, and other factors. Follow your health care provider's recommendations for frequency of eye exams.  Eat a healthy diet. Foods like vegetables, fruits, whole grains, low-fat dairy products, and lean protein foods contain the nutrients you need without too many calories. Decrease your intake of foods high in solid fats, added sugars, and salt. Eat the right amount of calories for you.Get information about a proper diet from your health care provider, if necessary.  Regular physical exercise is one of the most important things you can do for your health. Most adults should get at least 150 minutes of moderate-intensity exercise (any activity that increases your heart rate and causes you to sweat) each week. In addition, most adults need muscle-strengthening exercises on 2 or more days a week.  Maintain a healthy weight. The body mass index (BMI) is a screening tool to identify possible weight problems. It provides an estimate of body fat based on height and weight. Your health care provider can find your BMI and can help you achieve or maintain a healthy weight.For adults 20 years and older:  A BMI below 18.5 is  considered underweight.  A BMI of 18.5 to 24.9 is normal.  A BMI of 25 to 29.9 is considered overweight.  A BMI of 30 and above is considered obese.  Maintain normal blood lipids and cholesterol levels by exercising and minimizing your intake of saturated fat. Eat a balanced diet with plenty of fruit and vegetables. Blood tests for lipids and cholesterol should begin at age 31 and be repeated every 5 years. If your lipid or cholesterol levels are high, you are over 50, or you are at high risk  for heart disease, you may need your cholesterol levels checked more frequently.Ongoing high lipid and cholesterol levels should be treated with medicines if diet and exercise are not working.  If you smoke, find out from your health care provider how to quit. If you do not use tobacco, do not start.  Lung cancer screening is recommended for adults aged 47-80 years who are at high risk for developing lung cancer because of a history of smoking. A yearly low-dose CT scan of the lungs is recommended for people who have at least a 30-pack-year history of smoking and are a current smoker or have quit within the past 15 years. A pack year of smoking is smoking an average of 1 pack of cigarettes a day for 1 year (for example: 1 pack a day for 30 years or 2 packs a day for 15 years). Yearly screening should continue until the smoker has stopped smoking for at least 15 years. Yearly screening should be stopped for people who develop a health problem that would prevent them from having lung cancer treatment.  If you are pregnant, do not drink alcohol. If you are breastfeeding, be very cautious about drinking alcohol. If you are not pregnant and choose to drink alcohol, do not have more than 1 drink per day. One drink is considered to be 12 ounces (355 mL) of beer, 5 ounces (148 mL) of wine, or 1.5 ounces (44 mL) of liquor.  Avoid use of street drugs. Do not share needles with anyone. Ask for help if you need support or instructions about stopping the use of drugs.  High blood pressure causes heart disease and increases the risk of stroke. Your blood pressure should be checked at least every 1 to 2 years. Ongoing high blood pressure should be treated with medicines if weight loss and exercise do not work.  If you are 37-77 years old, ask your health care provider if you should take aspirin to prevent strokes.  Diabetes screening involves taking a blood sample to check your fasting blood sugar level. This should  be done once every 3 years, after age 61, if you are within normal weight and without risk factors for diabetes. Testing should be considered at a younger age or be carried out more frequently if you are overweight and have at least 1 risk factor for diabetes.  Breast cancer screening is essential preventive care for women. You should practice "breast self-awareness." This means understanding the normal appearance and feel of your breasts and may include breast self-examination. Any changes detected, no matter how small, should be reported to a health care provider. Women in their 84s and 30s should have a clinical breast exam (CBE) by a health care provider as part of a regular health exam every 1 to 3 years. After age 74, women should have a CBE every year. Starting at age 69, women should consider having a mammogram (breast X-ray test) every year. Women who  have a family history of breast cancer should talk to their health care provider about genetic screening. Women at a high risk of breast cancer should talk to their health care providers about having an MRI and a mammogram every year.  Breast cancer gene (BRCA)-related cancer risk assessment is recommended for women who have family members with BRCA-related cancers. BRCA-related cancers include breast, ovarian, tubal, and peritoneal cancers. Having family members with these cancers may be associated with an increased risk for harmful changes (mutations) in the breast cancer genes BRCA1 and BRCA2. Results of the assessment will determine the need for genetic counseling and BRCA1 and BRCA2 testing.  Routine pelvic exams to screen for cancer are no longer recommended for nonpregnant women who are considered low risk for cancer of the pelvic organs (ovaries, uterus, and vagina) and who do not have symptoms. Ask your health care provider if a screening pelvic exam is right for you.  If you have had past treatment for cervical cancer or a condition that  could lead to cancer, you need Pap tests and screening for cancer for at least 20 years after your treatment. If Pap tests have been discontinued, your risk factors (such as having a new sexual partner) need to be reassessed to determine if screening should be resumed. Some women have medical problems that increase the chance of getting cervical cancer. In these cases, your health care provider may recommend more frequent screening and Pap tests.  The HPV test is an additional test that may be used for cervical cancer screening. The HPV test looks for the virus that can cause the cell changes on the cervix. The cells collected during the Pap test can be tested for HPV. The HPV test could be used to screen women aged 33 years and older, and should be used in women of any age who have unclear Pap test results. After the age of 61, women should have HPV testing at the same frequency as a Pap test.  Colorectal cancer can be detected and often prevented. Most routine colorectal cancer screening begins at the age of 53 years and continues through age 23 years. However, your health care provider may recommend screening at an earlier age if you have risk factors for colon cancer. On a yearly basis, your health care provider may provide home test kits to check for hidden blood in the stool. Use of a small camera at the end of a tube, to directly examine the colon (sigmoidoscopy or colonoscopy), can detect the earliest forms of colorectal cancer. Talk to your health care provider about this at age 74, when routine screening begins. Direct exam of the colon should be repeated every 5-10 years through age 16 years, unless early forms of pre-cancerous polyps or small growths are found.  People who are at an increased risk for hepatitis B should be screened for this virus. You are considered at high risk for hepatitis B if:  You were born in a country where hepatitis B occurs often. Talk with your health care provider  about which countries are considered high risk.  Your parents were born in a high-risk country and you have not received a shot to protect against hepatitis B (hepatitis B vaccine).  You have HIV or AIDS.  You use needles to inject street drugs.  You live with, or have sex with, someone who has hepatitis B.  You get hemodialysis treatment.  You take certain medicines for conditions like cancer, organ transplantation, and autoimmune conditions.  Hepatitis C blood testing is recommended for all people born from [birth year] through 1965 and any individual with known risks for hepatitis C.  Practice safe sex. Use condoms and avoid high-risk sexual practices to reduce the spread of sexually transmitted infections (STIs). STIs include gonorrhea, chlamydia, syphilis, trichomonas, herpes, HPV, and human immunodeficiency virus (HIV). Herpes, HIV, and HPV are viral illnesses that have no cure. They can result in disability, cancer, and death.  You should be screened for sexually transmitted illnesses (STIs) including gonorrhea and chlamydia if:  You are sexually active and are younger than 24 years.  You are older than 24 years and your health care provider tells you that you are at risk for this type of infection.  Your sexual activity has changed since you were last screened and you are at an increased risk for chlamydia or gonorrhea. Ask your health care provider if you are at risk.  If you are at risk of being infected with HIV, it is recommended that you take a prescription medicine daily to prevent HIV infection. This is called preexposure prophylaxis (PrEP). You are considered at risk if:  You are a heterosexual woman, are sexually active, and are at increased risk for HIV infection.  You take drugs by injection.  You are sexually active with a partner who has HIV.  Talk with your health care provider about whether you are at high risk of being infected with HIV. If you choose to begin PrEP,  you should first be tested for HIV. You should then be tested every 3 months for as long as you are taking PrEP.  Osteoporosis is a disease in which the bones lose minerals and strength with aging. This can result in serious bone fractures or breaks. The risk of osteoporosis can be identified using a bone density scan. Women ages 45 years and over and women at risk for fractures or osteoporosis should discuss screening with their health care providers. Ask your health care provider whether you should take a calcium supplement or vitamin D to reduce the rate of osteoporosis.  Menopause can be associated with physical symptoms and risks. Hormone replacement therapy is available to decrease symptoms and risks. You should talk to your health care provider about whether hormone replacement therapy is right for you.  Use sunscreen. Apply sunscreen liberally and repeatedly throughout the day. You should seek shade when your shadow is shorter than you. Protect yourself by wearing long sleeves, pants, a wide-brimmed hat, and sunglasses year round, whenever you are outdoors.  Once a month, do a whole body skin exam, using a mirror to look at the skin on your back. Tell your health care provider of new moles, moles that have irregular borders, moles that are larger than a pencil eraser, or moles that have changed in shape or color.  Stay current with required vaccines (immunizations).  Influenza vaccine. All adults should be immunized every year.  Tetanus, diphtheria, and acellular pertussis (Td, Tdap) vaccine. Pregnant women should receive 1 dose of Tdap vaccine during each pregnancy. The dose should be obtained regardless of the length of time since the last dose. Immunization is preferred during the 27th-36th week of gestation. An adult who has not previously received Tdap or who does not know her vaccine status should receive 1 dose of Tdap. This initial dose should be followed by tetanus and diphtheria  toxoids (Td) booster doses every 10 years. Adults with an unknown or incomplete history of completing a 3-dose immunization series with  Td-containing vaccines should begin or complete a primary immunization series including a Tdap dose. Adults should receive a Td booster every 10 years.  Varicella vaccine. An adult without evidence of immunity to varicella should receive 2 doses or a second dose if she has previously received 1 dose. Pregnant females who do not have evidence of immunity should receive the first dose after pregnancy. This first dose should be obtained before leaving the health care facility. The second dose should be obtained 4-8 weeks after the first dose.  Human papillomavirus (HPV) vaccine. Females aged 13-26 years who have not received the vaccine previously should obtain the 3-dose series. The vaccine is not recommended for use in pregnant females. However, pregnancy testing is not needed before receiving a dose. If a female is found to be pregnant after receiving a dose, no treatment is needed. In that case, the remaining doses should be delayed until after the pregnancy. Immunization is recommended for any person with an immunocompromised condition through the age of 67 years if she did not get any or all doses earlier. During the 3-dose series, the second dose should be obtained 4-8 weeks after the first dose. The third dose should be obtained 24 weeks after the first dose and 16 weeks after the second dose.  Zoster vaccine. One dose is recommended for adults aged 10 years or older unless certain conditions are present.  Measles, mumps, and rubella (MMR) vaccine. Adults born before [birth year] generally are considered immune to measles and mumps. Adults born in [birth year] or later should have 1 or more doses of MMR vaccine unless there is a contraindication to the vaccine or there is laboratory evidence of immunity to each of the three diseases. A routine second dose of MMR vaccine should be  obtained at least 28 days after the first dose for students attending postsecondary schools, health care workers, or international travelers. People who received inactivated measles vaccine or an unknown type of measles vaccine during 1963-1967 should receive 2 doses of MMR vaccine. People who received inactivated mumps vaccine or an unknown type of mumps vaccine before 1979 and are at high risk for mumps infection should consider immunization with 2 doses of MMR vaccine. For females of childbearing age, rubella immunity should be determined. If there is no evidence of immunity, females who are not pregnant should be vaccinated. If there is no evidence of immunity, females who are pregnant should delay immunization until after pregnancy. Unvaccinated health care workers born before [birth year] who lack laboratory evidence of measles, mumps, or rubella immunity or laboratory confirmation of disease should consider measles and mumps immunization with 2 doses of MMR vaccine or rubella immunization with 1 dose of MMR vaccine.  Pneumococcal 13-valent conjugate (PCV13) vaccine. When indicated, a person who is uncertain of her immunization history and has no record of immunization should receive the PCV13 vaccine. An adult aged 91 years or older who has certain medical conditions and has not been previously immunized should receive 1 dose of PCV13 vaccine. This PCV13 should be followed with a dose of pneumococcal polysaccharide (PPSV23) vaccine. The PPSV23 vaccine dose should be obtained at least 8 weeks after the dose of PCV13 vaccine. An adult aged 61 years or older who has certain medical conditions and previously received 1 or more doses of PPSV23 vaccine should receive 1 dose of PCV13. The PCV13 vaccine dose should be obtained 1 or more years after the last PPSV23 vaccine dose.  Pneumococcal polysaccharide (PPSV23) vaccine. When PCV13 is also  indicated, PCV13 should be obtained first. All adults aged 104 years and older  should be immunized. An adult younger than age 64 years who has certain medical conditions should be immunized. Any person who resides in a nursing home or long-term care facility should be immunized. An adult smoker should be immunized. People with an immunocompromised condition and certain other conditions should receive both PCV13 and PPSV23 vaccines. People with human immunodeficiency virus (HIV) infection should be immunized as soon as possible after diagnosis. Immunization during chemotherapy or radiation therapy should be avoided. Routine use of PPSV23 vaccine is not recommended for American Indians, [place] Natives, or people younger than 65 years unless there are medical conditions that require PPSV23 vaccine. When indicated, people who have unknown immunization and have no record of immunization should receive PPSV23 vaccine. One-time revaccination 5 years after the first dose of PPSV23 is recommended for people aged 19-64 years who have chronic kidney failure, nephrotic syndrome, asplenia, or immunocompromised conditions. People who received 1-2 doses of PPSV23 before age 24 years should receive another dose of PPSV23 vaccine at age 59 years or later if at least 5 years have passed since the previous dose. Doses of PPSV23 are not needed for people immunized with PPSV23 at or after age 83 years.  Meningococcal vaccine. Adults with asplenia or persistent complement component deficiencies should receive 2 doses of quadrivalent meningococcal conjugate (MenACWY-D) vaccine. The doses should be obtained at least 2 months apart. Microbiologists working with certain meningococcal bacteria, [employer] recruits, people at risk during an outbreak, and people who travel to or live in countries with a high rate of meningitis should be immunized. A first-year college student up through age 5 years who is living in a residence hall should receive a dose if she did not receive a dose on or after her 16th birthday. Adults  who have certain high-risk conditions should receive one or more doses of vaccine.  Hepatitis A vaccine. Adults who wish to be protected from this disease, have certain high-risk conditions, work with hepatitis A-infected animals, work in hepatitis A research labs, or travel to or work in countries with a high rate of hepatitis A should be immunized. Adults who were previously unvaccinated and who anticipate close contact with an international adoptee during the first 60 days after arrival in the [place] from a country with a high rate of hepatitis A should be immunized.  Hepatitis B vaccine. Adults who wish to be protected from this disease, have certain high-risk conditions, may be exposed to blood or other infectious body fluids, are household contacts or sex partners of hepatitis B positive people, are clients or workers in certain care facilities, or travel to or work in countries with a high rate of hepatitis B should be immunized.  Haemophilus influenzae type b (Hib) vaccine. A previously unvaccinated person with asplenia or sickle cell disease or having a scheduled splenectomy should receive 1 dose of Hib vaccine. Regardless of previous immunization, a recipient of a hematopoietic stem cell transplant should receive a 3-dose series 6-12 months after her successful transplant. Hib vaccine is not recommended for adults with HIV infection. Preventive Services / Frequency Ages 24 years and over  Blood pressure check.** / Every 1 to 2 years.  Lipid and cholesterol check.** / Every 5 years beginning at age 60 years.  Lung cancer screening. / Every year if you are aged 55-80 years and have a 30-pack-year history of smoking and currently smoke or have quit within the past  15 years. Yearly screening is stopped once you have quit smoking for at least 15 years or develop a health problem that would prevent you from having lung cancer treatment.  Clinical breast exam.** / Every year after age 79  years.  BRCA-related cancer risk assessment.** / For women who have family members with a BRCA-related cancer (breast, ovarian, tubal, or peritoneal cancers).  Mammogram.** / Every year beginning at age 25 years and continuing for as long as you are in good health. Consult with your health care provider.  Pap test.** / Every 3 years starting at age 63 years through age 36 or 7 years with 3 consecutive normal Pap tests. Testing can be stopped between 65 and 70 years with 3 consecutive normal Pap tests and no abnormal Pap or HPV tests in the past 10 years.  HPV screening.** / Every 3 years from ages 63 years through ages 85 or 64 years with a history of 3 consecutive normal Pap tests. Testing can be stopped between 65 and 70 years with 3 consecutive normal Pap tests and no abnormal Pap or HPV tests in the past 10 years.  Fecal occult blood test (FOBT) of stool. / Every year beginning at age 90 years and continuing until age 53 years. You may not need to do this test if you get a colonoscopy every 10 years.  Flexible sigmoidoscopy or colonoscopy.** / Every 5 years for a flexible sigmoidoscopy or every 10 years for a colonoscopy beginning at age 5 years and continuing until age 72 years.  Hepatitis C blood test.** / For all people born from [birth year] through 1963 and any individual with known risks for hepatitis C.  Osteoporosis screening.** / A one-time screening for women ages 50 years and over and women at risk for fractures or osteoporosis.  Skin self-exam. / Monthly.  Influenza vaccine. / Every year.  Tetanus, diphtheria, and acellular pertussis (Tdap/Td) vaccine.** / 1 dose of Td every 10 years.  Varicella vaccine.** / Consult your health care provider.  Zoster vaccine.** / 1 dose for adults aged 69 years or older.  Pneumococcal 13-valent conjugate (PCV13) vaccine.** / Consult your health care provider.  Pneumococcal polysaccharide (PPSV23) vaccine.** / 1 dose for all adults aged 36  years and older.  Meningococcal vaccine.** / Consult your health care provider.  Hepatitis A vaccine.** / Consult your health care provider.  Hepatitis B vaccine.** / Consult your health care provider.  Haemophilus influenzae type b (Hib) vaccine.** / Consult your health care provider. ** Family history and personal history of risk and conditions may change your health care provider's recommendations. Document Released: 09/26/2001 Document Revised: 12/15/2013 Document Reviewed: 12/26/2010 [facility] Patient Information 2015 [place], [place]. This information is not intended to replace advice given to you by your health care provider. Make sure you discuss any questions you have with your health care provider.

## 2015-03-10 ENCOUNTER — Other Ambulatory Visit: Payer: Medicare Other

## 2015-03-10 ENCOUNTER — Ambulatory Visit: Payer: Self-pay

## 2015-03-18 ENCOUNTER — Other Ambulatory Visit: Payer: Self-pay | Admitting: Family Medicine

## 2015-03-26 ENCOUNTER — Encounter: Payer: Self-pay | Admitting: Nurse Practitioner

## 2015-03-26 ENCOUNTER — Ambulatory Visit (INDEPENDENT_AMBULATORY_CARE_PROVIDER_SITE_OTHER): Payer: Medicare Other | Admitting: Nurse Practitioner

## 2015-03-26 VITALS — BP 132/64 | HR 67 | Temp 97.6°F | Ht 60.0 in | Wt 151.0 lb

## 2015-03-26 DIAGNOSIS — I6523 Occlusion and stenosis of bilateral carotid arteries: Secondary | ICD-10-CM | POA: Diagnosis not present

## 2015-03-26 DIAGNOSIS — B079 Viral wart, unspecified: Secondary | ICD-10-CM | POA: Diagnosis not present

## 2015-03-26 DIAGNOSIS — L723 Sebaceous cyst: Secondary | ICD-10-CM

## 2015-03-26 NOTE — Patient Instructions (Signed)
Dressing Change °A dressing is a material placed over wounds. It keeps the wound clean, dry, and protected from further injury. This provides an environment that favors wound healing.  °BEFORE YOU BEGIN °· Get your supplies together. Things you may need include: °¨ Saline solution. °¨ Flexible gauze dressing. °¨ Medicated cream. °¨ Tape. °¨ Gloves. °¨ Abdominal dressing pads. °¨ Gauze squares. °¨ Plastic bags. °· Take pain medicine 30 minutes before the dressing change if you need it. °· Take a shower before you do the first dressing change of the day. Use plastic wrap or a plastic bag to prevent the dressing from getting wet. °REMOVING YOUR OLD DRESSING  °· Wash your hands with soap and water. Dry your hands with a clean towel. °· Put on your gloves. °· Remove any tape. °· Carefully remove the old dressing. If the dressing sticks, you may dampen it with warm water to loosen it, or follow your caregiver's specific directions. °· Remove any gauze or packing tape that is in your wound. °· Take off your gloves. °· Put the gloves, tape, gauze, or any packing tape into a plastic bag. °CHANGING YOUR DRESSING °· Open the supplies. °· Take the cap off the saline solution. °· Open the gauze package so that the gauze remains on the inside of the package. °· Put on your gloves. °· Clean your wound as told by your caregiver. °· If you have been told to keep your wound dry, follow those instructions. °· Your caregiver may tell you to do one or more of the following: °¨ Pick up the gauze. Pour the saline solution over the gauze. Squeeze out the extra saline solution. °¨ Put medicated cream or other medicine on your wound if you have been told to do so. °¨ Put the solution soaked gauze only in your wound, not on the skin around it. °¨ Pack your wound loosely or as told by your caregiver. °¨ Put dry gauze on your wound. °¨ Put abdominal dressing pads over the dry gauze if your wet gauze soaks through. °· Tape the abdominal dressing  pads in place so they will not fall off. Do not wrap the tape completely around the affected part (arm, leg, abdomen). °· Wrap the dressing pads with a flexible gauze dressing to secure it in place. °· Take off your gloves. Put them in the plastic bag with the old dressing. Tie the bag shut and throw it away. °· Keep the dressing clean and dry until your next dressing change. °· Wash your hands. °SEEK MEDICAL CARE IF: °· Your skin around the wound looks red. °· Your wound feels more tender or sore. °· You see pus in the wound. °· Your wound smells bad. °· You have a fever. °· Your skin around the wound has a rash that itches and burns. °· You see black or yellow skin in your wound that was not there before. °· You feel nauseous, throw up, and feel very tired. °Document Released: 09/07/2004 Document Revised: 10/23/2011 Document Reviewed: 06/12/2011 °ExitCare® Patient Information ©2015 [employer]. This information is not intended to replace advice given to you by your health care provider. Make sure you discuss any questions you have with your health care provider. ° °

## 2015-03-26 NOTE — Progress Notes (Signed)
   Subjective:    Patient ID: Melissa Carpenter, female    DOB: 15-Nov-1933, 81 y.o.   MRN: [id number]  HPI Patient here today to have a sebaceous cyst removed from back- has been there for over a year and has gotten some bigger- denies any drainage.    Review of Systems  All other systems reviewed and are negative.      Objective:   Physical Exam  Constitutional: She is oriented to person, place, and time. She appears well-developed and well-nourished. No distress.  Cardiovascular: Normal rate, regular rhythm and normal heart sounds.   Pulmonary/Chest: Effort normal and breath sounds normal.  Neurological: She is alert and oriented to person, place, and time.  Skin: Skin is warm.  4cm annular sebaceous cyst mid upper back  Psychiatric: She has a normal mood and affect. Her behavior is normal. Judgment and thought content normal.   Procedure:  lidocane 2% with epi- 3cc local  Cleaned with betadine  #15 blade to incise  currettte used to remove cheesy like material from area  1/4" idooform packing  tegaderm dressing  Patient tolerated well. Cryotherapy of horny felsh colored lesion left upper arm        Assessment & Plan:   1. Sebaceous cyst   2. Wart    Wound care discussed Follow up Monday to change packing  [name], FNP

## 2015-03-30 ENCOUNTER — Encounter: Payer: Self-pay | Admitting: Nurse Practitioner

## 2015-03-30 ENCOUNTER — Ambulatory Visit (INDEPENDENT_AMBULATORY_CARE_PROVIDER_SITE_OTHER): Payer: Medicare Other | Admitting: Nurse Practitioner

## 2015-03-30 VITALS — BP 133/62 | HR 66 | Temp 97.3°F | Ht 60.0 in | Wt 151.0 lb

## 2015-03-30 DIAGNOSIS — I6523 Occlusion and stenosis of bilateral carotid arteries: Secondary | ICD-10-CM

## 2015-03-30 DIAGNOSIS — Z4800 Encounter for change or removal of nonsurgical wound dressing: Secondary | ICD-10-CM

## 2015-03-30 NOTE — Progress Notes (Signed)
   Subjective:    Patient ID: Melissa Carpenter, female    DOB: 1934/06/19, 81 y.o.   MRN: [id number]  HPI Patient was seen 03/26/15 for removal of sebaceous cyst. Packing was inserted and she is here today for packing change. SHe says that area is doing well with mild clear drainage.    Review of Systems  Constitutional: Negative.   HENT: Negative.   Respiratory: Negative.   Cardiovascular: Negative.   Gastrointestinal: Negative.   Genitourinary: Negative.   Neurological: Negative.   Psychiatric/Behavioral: Negative.   All other systems reviewed and are negative.      Objective:   Physical Exam  Constitutional: She is oriented to person, place, and time. She appears well-developed and well-nourished.  Cardiovascular: Normal rate, regular rhythm and normal heart sounds.   Pulmonary/Chest: Effort normal and breath sounds normal.  Neurological: She is alert and oriented to person, place, and time.  Skin: Skin is warm.  Wound area clean and dry today- no erythema or edema.   Packing change - tolerated well.       Assessment & Plan:   1. Change or removal of wound packing    Keep clean and dry Removal of small amount of wound packing daily TO prn  Shakiyah-Margaret Hassell Done, FNP

## 2015-04-06 ENCOUNTER — Ambulatory Visit (INDEPENDENT_AMBULATORY_CARE_PROVIDER_SITE_OTHER): Payer: Medicare Other | Admitting: Nurse Practitioner

## 2015-04-06 ENCOUNTER — Encounter: Payer: Self-pay | Admitting: Nurse Practitioner

## 2015-04-06 VITALS — BP 136/62 | HR 66 | Temp 97.3°F | Ht 60.0 in | Wt 151.0 lb

## 2015-04-06 DIAGNOSIS — Z4800 Encounter for change or removal of nonsurgical wound dressing: Secondary | ICD-10-CM

## 2015-04-06 DIAGNOSIS — I6523 Occlusion and stenosis of bilateral carotid arteries: Secondary | ICD-10-CM | POA: Diagnosis not present

## 2015-04-06 NOTE — Patient Instructions (Signed)
Dressing Change °A dressing is a material placed over wounds. It keeps the wound clean, dry, and protected from further injury. This provides an environment that favors wound healing.  °BEFORE YOU BEGIN °· Get your supplies together. Things you may need include: °¨ Saline solution. °¨ Flexible gauze dressing. °¨ Medicated cream. °¨ Tape. °¨ Gloves. °¨ Abdominal dressing pads. °¨ Gauze squares. °¨ Plastic bags. °· Take pain medicine 30 minutes before the dressing change if you need it. °· Take a shower before you do the first dressing change of the day. Use plastic wrap or a plastic bag to prevent the dressing from getting wet. °REMOVING YOUR OLD DRESSING  °· Wash your hands with soap and water. Dry your hands with a clean towel. °· Put on your gloves. °· Remove any tape. °· Carefully remove the old dressing. If the dressing sticks, you may dampen it with warm water to loosen it, or follow your caregiver's specific directions. °· Remove any gauze or packing tape that is in your wound. °· Take off your gloves. °· Put the gloves, tape, gauze, or any packing tape into a plastic bag. °CHANGING YOUR DRESSING °· Open the supplies. °· Take the cap off the saline solution. °· Open the gauze package so that the gauze remains on the inside of the package. °· Put on your gloves. °· Clean your wound as told by your caregiver. °· If you have been told to keep your wound dry, follow those instructions. °· Your caregiver may tell you to do one or more of the following: °¨ Pick up the gauze. Pour the saline solution over the gauze. Squeeze out the extra saline solution. °¨ Put medicated cream or other medicine on your wound if you have been told to do so. °¨ Put the solution soaked gauze only in your wound, not on the skin around it. °¨ Pack your wound loosely or as told by your caregiver. °¨ Put dry gauze on your wound. °¨ Put abdominal dressing pads over the dry gauze if your wet gauze soaks through. °· Tape the abdominal dressing  pads in place so they will not fall off. Do not wrap the tape completely around the affected part (arm, leg, abdomen). °· Wrap the dressing pads with a flexible gauze dressing to secure it in place. °· Take off your gloves. Put them in the plastic bag with the old dressing. Tie the bag shut and throw it away. °· Keep the dressing clean and dry until your next dressing change. °· Wash your hands. °SEEK MEDICAL CARE IF: °· Your skin around the wound looks red. °· Your wound feels more tender or sore. °· You see pus in the wound. °· Your wound smells bad. °· You have a fever. °· Your skin around the wound has a rash that itches and burns. °· You see black or yellow skin in your wound that was not there before. °· You feel nauseous, throw up, and feel very tired. °Document Released: 09/07/2004 Document Revised: 10/23/2011 Document Reviewed: 06/12/2011 °ExitCare® Patient Information ©2015 [employer]. This information is not intended to replace advice given to you by your health care provider. Make sure you discuss any questions you have with your health care provider. ° °

## 2015-04-06 NOTE — Progress Notes (Signed)
   Subjective:    Patient ID: Melissa Carpenter, female    DOB: 02/18/1934, 81 y.o.   MRN: [id number]  HPI Patient had sebaceous cyst I&d 1 week ago- her husband has been taking care  Of packing at home- she is here today for packing change. No complaints to day- denies any drainage from area.    Review of Systems  Constitutional: Negative.   HENT: Negative.   Respiratory: Negative.   Cardiovascular: Negative.   Genitourinary: Negative.   Neurological: Negative.   Psychiatric/Behavioral: Negative.   All other systems reviewed and are negative.      Objective:   Physical Exam  Constitutional: She is oriented to person, place, and time. She appears well-developed and well-nourished.  Cardiovascular: Normal rate, regular rhythm and normal heart sounds.   Pulmonary/Chest: Effort normal and breath sounds normal.  Neurological: She is alert and oriented to person, place, and time.  Skin: Skin is warm. There is erythema (around wound edges- no drainage).   BP 136/62 mmHg  Pulse 66  Temp(Src) 97.3 F (36.3 C) (Oral)  Ht 5' (1.524 m)  Wt 151 lb (68.493 kg)  BMI 29.49 kg/m2        Assessment & Plan:   1. Change or removal of wound packing    Wound care reviewed Watch for signs of infection RTO in 1 week  [place], FNP

## 2015-04-13 ENCOUNTER — Ambulatory Visit (INDEPENDENT_AMBULATORY_CARE_PROVIDER_SITE_OTHER): Payer: Medicare Other | Admitting: Nurse Practitioner

## 2015-04-13 ENCOUNTER — Encounter: Payer: Self-pay | Admitting: Nurse Practitioner

## 2015-04-13 VITALS — BP 145/67 | HR 63 | Temp 97.1°F | Resp 18 | Wt 150.6 lb

## 2015-04-13 DIAGNOSIS — Z4800 Encounter for change or removal of nonsurgical wound dressing: Secondary | ICD-10-CM | POA: Diagnosis not present

## 2015-04-13 DIAGNOSIS — I6523 Occlusion and stenosis of bilateral carotid arteries: Secondary | ICD-10-CM

## 2015-04-13 NOTE — Progress Notes (Signed)
   Subjective:    Patient ID: Melissa Carpenter, female    DOB: 1933/11/03, 81 y.o.   MRN: [id number]  HPI Patient had a sebaceous cyst removed on 03/26/15- she is doing well and is here today for packing change- no drainage.    Review of Systems  Constitutional: Negative.   HENT: Negative.   Respiratory: Negative.   Cardiovascular: Negative.   Genitourinary: Negative.   Neurological: Negative.   Psychiatric/Behavioral: Negative.   All other systems reviewed and are negative.      Objective:   Physical Exam  Constitutional: She appears well-developed and well-nourished.  Cardiovascular: Normal rate and normal heart sounds.   Pulmonary/Chest: Effort normal and breath sounds normal.  Neurological: She is alert.  Skin: Skin is warm and dry.  Wound on back clear- no drainage- no erythema  Psychiatric: She has a normal mood and affect. Her behavior is normal. Judgment and thought content normal.    BP 145/67 mmHg  Pulse 63  Temp(Src) 97.1 F (36.2 C) (Oral)  Resp 18  Wt 150 lb 9.6 oz (68.312 kg)  SpO2 97%       Assessment & Plan:   1. Change or removal of wound packing    Reviewed care Follow up Tuesday for recheck  Netty-Margaret Hassell Done, FNP

## 2015-04-16 ENCOUNTER — Other Ambulatory Visit (INDEPENDENT_AMBULATORY_CARE_PROVIDER_SITE_OTHER): Payer: Medicare Other

## 2015-04-16 DIAGNOSIS — E785 Hyperlipidemia, unspecified: Secondary | ICD-10-CM | POA: Diagnosis not present

## 2015-04-16 DIAGNOSIS — I10 Essential (primary) hypertension: Secondary | ICD-10-CM | POA: Diagnosis not present

## 2015-04-16 DIAGNOSIS — E559 Vitamin D deficiency, unspecified: Secondary | ICD-10-CM | POA: Diagnosis not present

## 2015-04-16 DIAGNOSIS — E8881 Metabolic syndrome: Secondary | ICD-10-CM

## 2015-04-16 DIAGNOSIS — K219 Gastro-esophageal reflux disease without esophagitis: Secondary | ICD-10-CM

## 2015-04-16 NOTE — Progress Notes (Signed)
Lab only 

## 2015-04-17 LAB — CBC WITH DIFFERENTIAL/PLATELET
BASOS ABS: 0 10*3/uL (ref 0.0–0.2)
Basos: 0 %
EOS (ABSOLUTE): 0.2 10*3/uL (ref 0.0–0.4)
Eos: 3 %
Hematocrit: 37 % (ref 34.0–46.6)
Hemoglobin: 12.8 g/dL (ref 11.1–15.9)
Immature Grans (Abs): 0 10*3/uL (ref 0.0–0.1)
Immature Granulocytes: 0 %
LYMPHS ABS: 1.5 10*3/uL (ref 0.7–3.1)
Lymphs: 28 %
MCH: 31.1 pg (ref 26.6–33.0)
MCHC: 34.6 g/dL (ref 31.5–35.7)
MCV: 90 fL (ref 79–97)
Monocytes Absolute: 0.6 10*3/uL (ref 0.1–0.9)
Monocytes: 11 %
NEUTROS ABS: 3 10*3/uL (ref 1.4–7.0)
Neutrophils: 58 %
PLATELETS: 248 10*3/uL (ref 150–379)
RBC: 4.12 x10E6/uL (ref 3.77–5.28)
RDW: 13.5 % (ref 12.3–15.4)
WBC: 5.3 10*3/uL (ref 3.4–10.8)

## 2015-04-17 LAB — NMR, LIPOPROFILE
Cholesterol: 167 mg/dL (ref 100–199)
HDL CHOLESTEROL BY NMR: 36 mg/dL — AB (ref >39)
HDL Particle Number: 29.2 umol/L — ABNORMAL LOW (ref >=30.5)
LDL PARTICLE NUMBER: 1503 nmol/L — AB (ref <1000)
LDL Size: 19.9 nm (ref >20.5)
LDL-C: 92 mg/dL (ref 0–99)
LP-IR SCORE: 66 — AB (ref <=45)
Small LDL Particle Number: 1058 nmol/L — ABNORMAL HIGH (ref <=527)
Triglycerides by NMR: 194 mg/dL — ABNORMAL HIGH (ref 0–149)

## 2015-04-17 LAB — BMP8+EGFR
BUN / CREAT RATIO: 26 (ref 11–26)
BUN: 15 mg/dL (ref 8–27)
CO2: 23 mmol/L (ref 18–29)
CREATININE: 0.58 mg/dL (ref 0.57–1.00)
Calcium: 9.7 mg/dL (ref 8.7–10.3)
Chloride: 101 mmol/L (ref 97–108)
GFR calc Af Amer: 101 mL/min/{1.73_m2} (ref >59)
GFR calc non Af Amer: 88 mL/min/{1.73_m2} (ref >59)
Glucose: 104 mg/dL — ABNORMAL HIGH (ref 65–99)
POTASSIUM: 4.3 mmol/L (ref 3.5–5.2)
SODIUM: 141 mmol/L (ref 134–144)

## 2015-04-17 LAB — HEPATIC FUNCTION PANEL
ALK PHOS: 76 IU/L (ref 39–117)
ALT: 21 IU/L (ref 0–32)
AST: 26 IU/L (ref 0–40)
Albumin: 4.7 g/dL (ref 3.5–4.8)
Bilirubin Total: 0.4 mg/dL (ref 0.0–1.2)
Bilirubin, Direct: 0.1 mg/dL (ref 0.00–0.40)
Total Protein: 7.1 g/dL (ref 6.0–8.5)

## 2015-04-17 LAB — VITAMIN D 25 HYDROXY (VIT D DEFICIENCY, FRACTURES): Vit D, 25-Hydroxy: 52.5 ng/mL (ref 30.0–100.0)

## 2015-04-20 ENCOUNTER — Encounter: Payer: Self-pay | Admitting: Nurse Practitioner

## 2015-04-20 ENCOUNTER — Ambulatory Visit (INDEPENDENT_AMBULATORY_CARE_PROVIDER_SITE_OTHER): Payer: Medicare Other | Admitting: Nurse Practitioner

## 2015-04-20 VITALS — BP 132/66 | HR 65 | Temp 97.6°F | Ht 60.0 in | Wt 150.0 lb

## 2015-04-20 DIAGNOSIS — I6523 Occlusion and stenosis of bilateral carotid arteries: Secondary | ICD-10-CM

## 2015-04-20 DIAGNOSIS — Z4800 Encounter for change or removal of nonsurgical wound dressing: Secondary | ICD-10-CM | POA: Diagnosis not present

## 2015-04-20 NOTE — Progress Notes (Signed)
   Subjective:    Patient ID: Melissa Carpenter, female    DOB: Oct 22, 1933, 81 y.o.   MRN: [id number]  HPI  Patient had a sebaceous cyst removed on 03/26/15- she is doing well and is here today for packing change- no drainage.    Review of Systems  Constitutional: Negative.   HENT: Negative.   Respiratory: Negative.   Cardiovascular: Negative.   Genitourinary: Negative.   Neurological: Negative.   Psychiatric/Behavioral: Negative.   All other systems reviewed and are negative.      Objective:   Physical Exam  Constitutional: She appears well-developed and well-nourished.  Cardiovascular: Normal rate and normal heart sounds.   Pulmonary/Chest: Effort normal and breath sounds normal.  Neurological: She is alert.  Skin: Skin is warm and dry.  Wound on back clear- no drainage- no erythema  Psychiatric: She has a normal mood and affect. Her behavior is normal. Judgment and thought content normal.   wound packing changed- tolerated well  BP 132/66 mmHg  Pulse 65  Temp(Src) 97.6 F (36.4 C) (Oral)  Ht 5' (1.524 m)  Wt 150 lb (68.04 kg)  BMI 29.30 kg/m2       Assessment & Plan:   1. Change or removal of wound packing    Keep check of wound  Slight removal of small amount of packing daily RTO prn  Angelicia-Margaret Hassell Done, FNP

## 2015-04-26 ENCOUNTER — Ambulatory Visit (INDEPENDENT_AMBULATORY_CARE_PROVIDER_SITE_OTHER): Payer: Medicare Other

## 2015-04-26 ENCOUNTER — Ambulatory Visit (INDEPENDENT_AMBULATORY_CARE_PROVIDER_SITE_OTHER): Payer: Medicare Other | Admitting: Family Medicine

## 2015-04-26 ENCOUNTER — Encounter: Payer: Self-pay | Admitting: Family Medicine

## 2015-04-26 VITALS — BP 122/62 | HR 70 | Temp 97.8°F | Ht 60.0 in | Wt 147.0 lb

## 2015-04-26 DIAGNOSIS — I6523 Occlusion and stenosis of bilateral carotid arteries: Secondary | ICD-10-CM | POA: Diagnosis not present

## 2015-04-26 DIAGNOSIS — E559 Vitamin D deficiency, unspecified: Secondary | ICD-10-CM

## 2015-04-26 DIAGNOSIS — I779 Disorder of arteries and arterioles, unspecified: Secondary | ICD-10-CM

## 2015-04-26 DIAGNOSIS — M79605 Pain in left leg: Secondary | ICD-10-CM | POA: Diagnosis not present

## 2015-04-26 DIAGNOSIS — E785 Hyperlipidemia, unspecified: Secondary | ICD-10-CM

## 2015-04-26 DIAGNOSIS — M25511 Pain in right shoulder: Secondary | ICD-10-CM

## 2015-04-26 DIAGNOSIS — M79604 Pain in right leg: Secondary | ICD-10-CM | POA: Diagnosis not present

## 2015-04-26 DIAGNOSIS — K219 Gastro-esophageal reflux disease without esophagitis: Secondary | ICD-10-CM | POA: Diagnosis not present

## 2015-04-26 DIAGNOSIS — I10 Essential (primary) hypertension: Secondary | ICD-10-CM | POA: Diagnosis not present

## 2015-04-26 DIAGNOSIS — I739 Peripheral vascular disease, unspecified: Secondary | ICD-10-CM

## 2015-04-26 IMAGING — CR DG SHOULDER 2+V*R*
3 series · 3 of 3 positions shown · non-contrast
Comparison: None.

CLINICAL DATA: Right shoulder pain, no known injury.

EXAM:
RIGHT SHOULDER - 2+ VIEW

[view not recorded (1 of 3)]
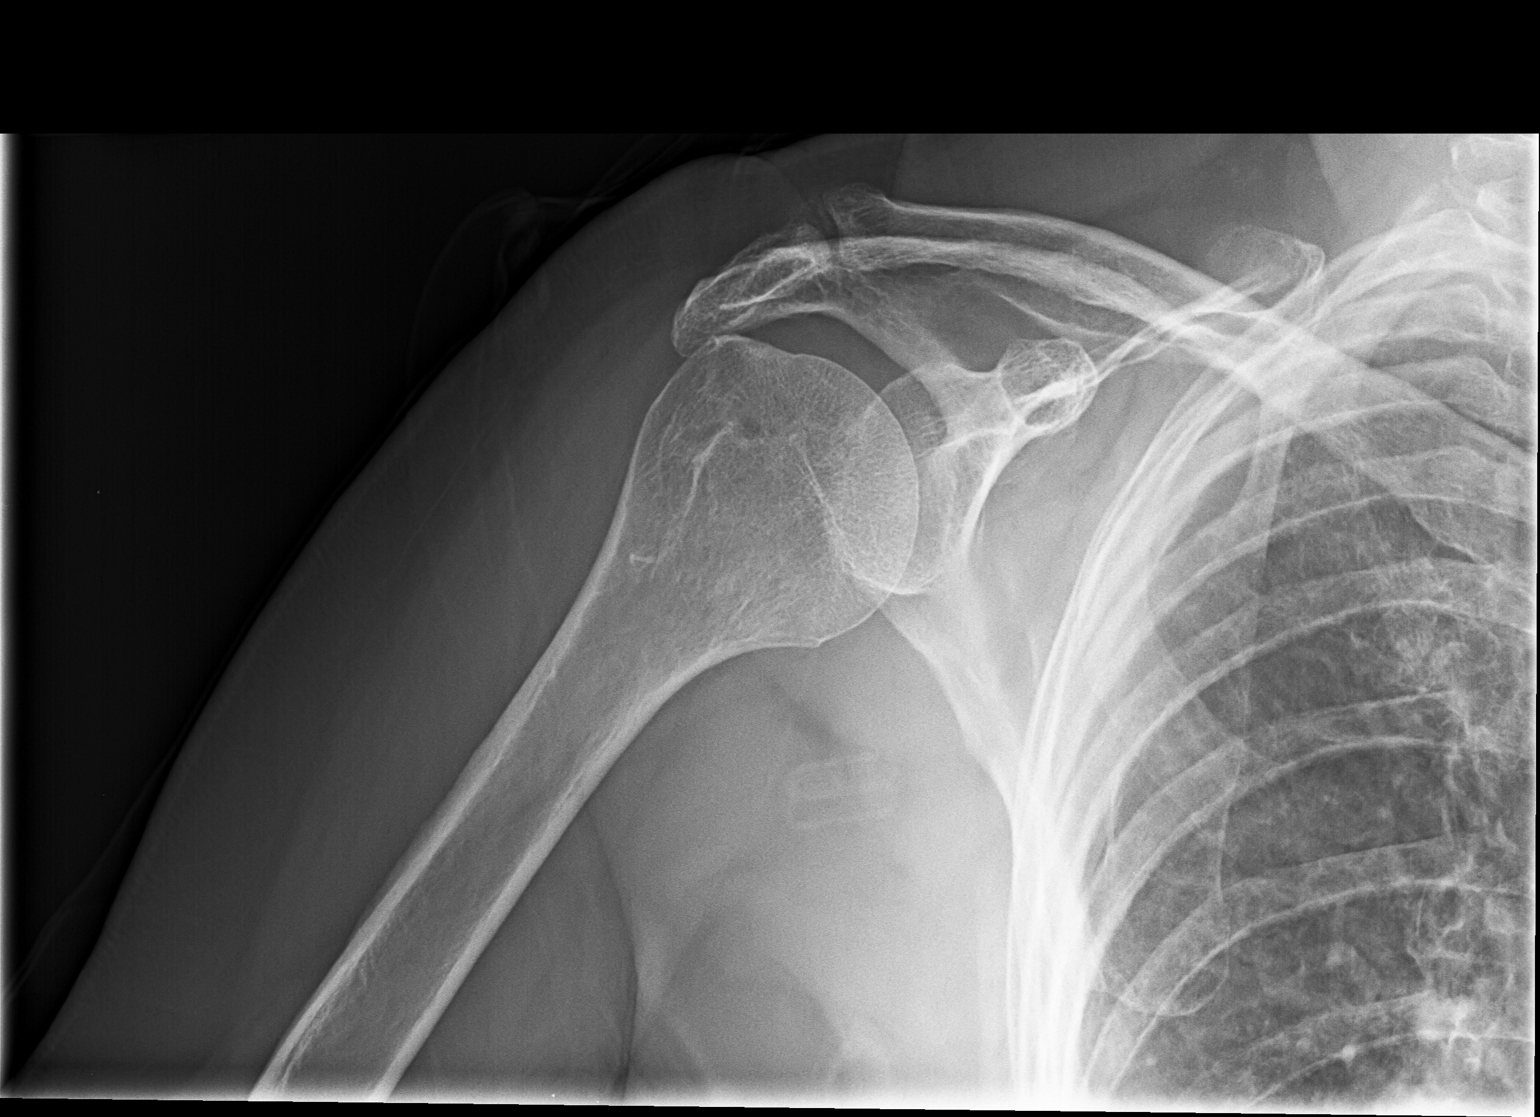

[view not recorded (2 of 3)]
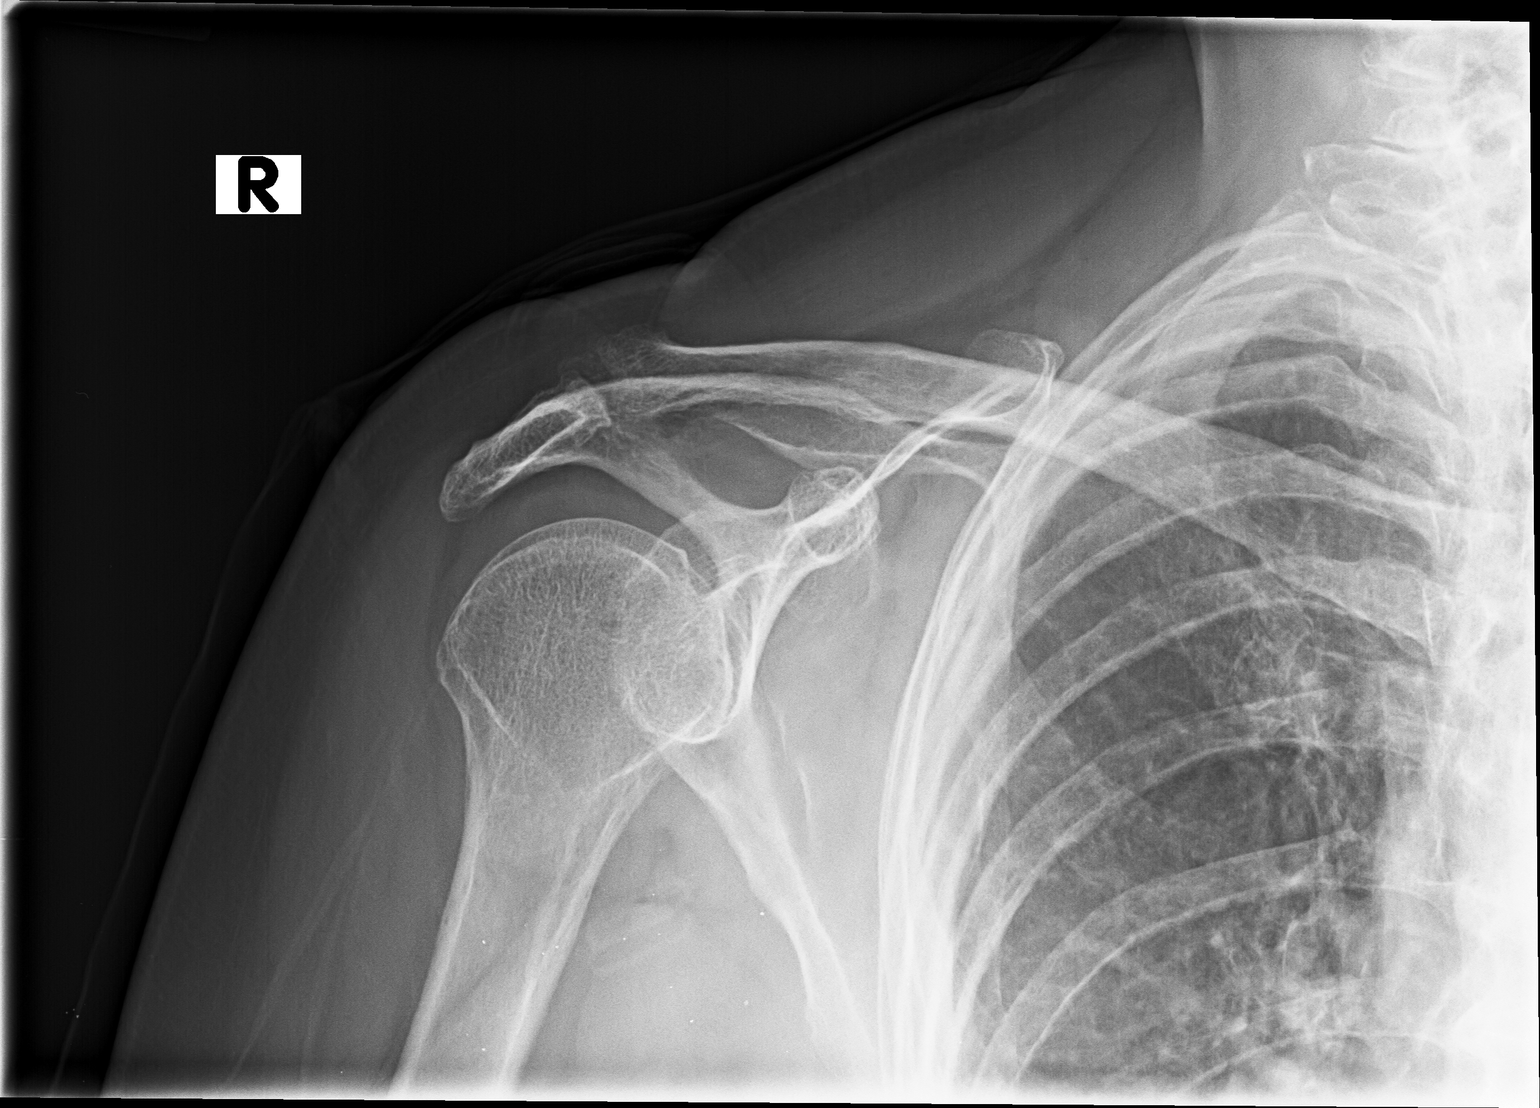

[view not recorded (3 of 3)]
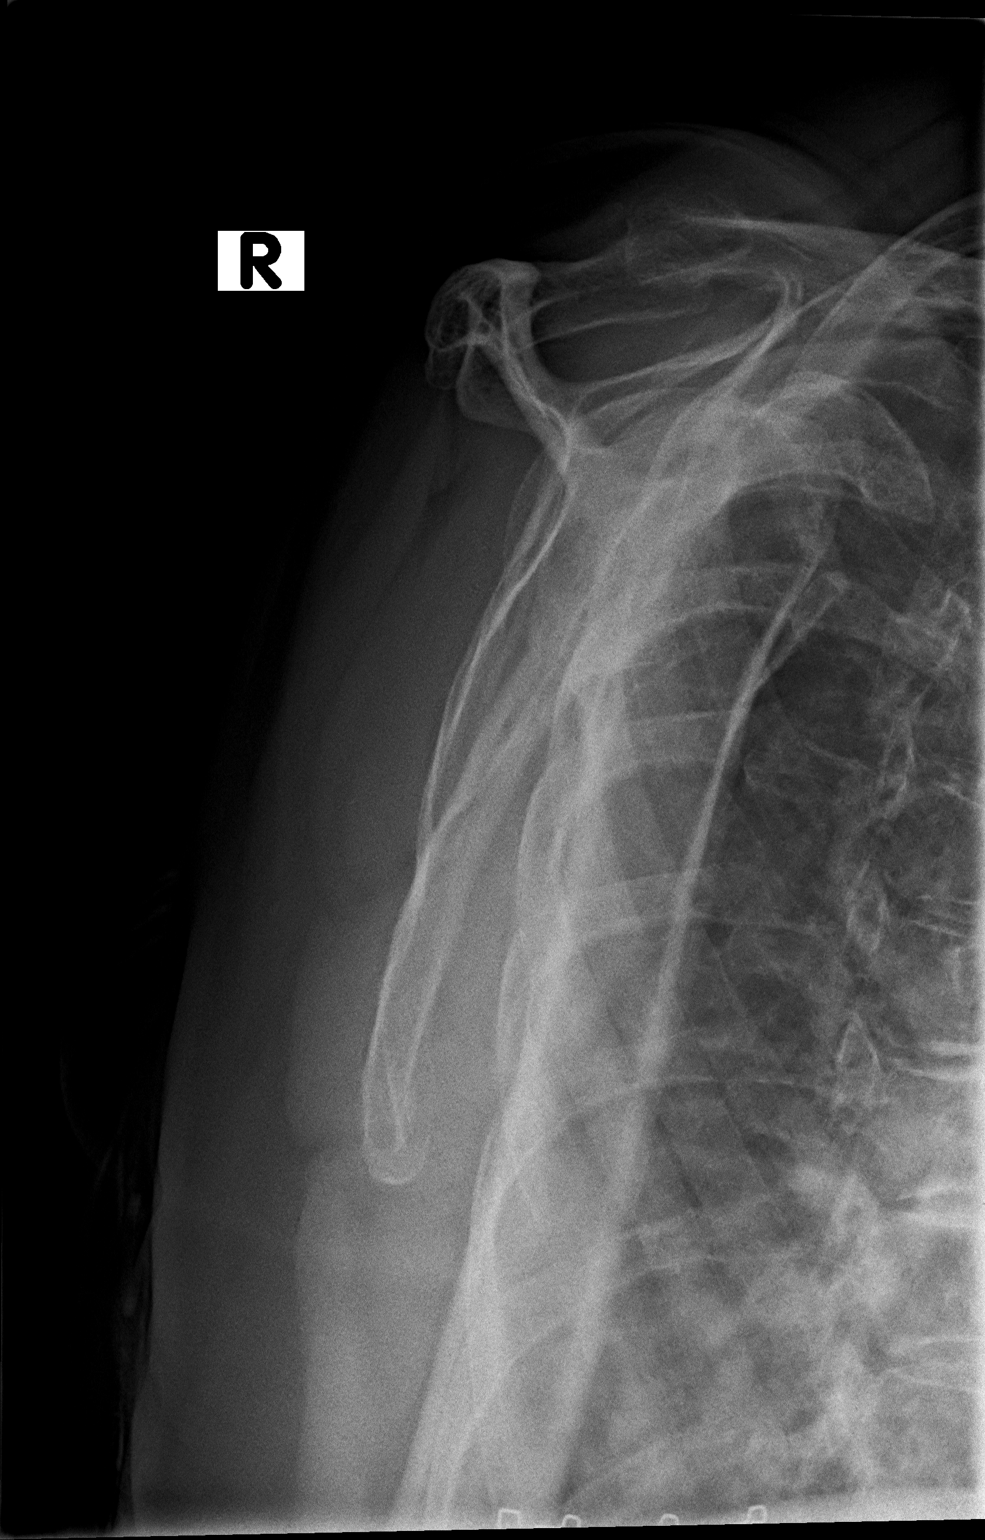

[3 of 3 positions shown; findings below may reference images not displayed]

FINDINGS: Prominent degenerative changes seen at the acromioclavicular joint.
No fracture or dislocation. Visualized portion of the right chest is
unremarkable.
IMPRESSION: Advanced right acromioclavicular joint osteoarthritis.

## 2015-04-26 NOTE — Patient Instructions (Addendum)
Medicare Annual Wellness Visit  [facility] and the medical providers at [facility] strive to bring you the best medical care.  In doing so we not only want to address your current medical conditions and concerns but also to detect new conditions early and prevent illness, disease and health-related problems.    Medicare offers a yearly Wellness Visit which allows our clinical staff to assess your need for preventative services including immunizations, lifestyle education, counseling to decrease risk of preventable diseases and screening for fall risk and other medical concerns.    This visit is provided free of charge (no copay) for all Medicare recipients. The clinical pharmacists at [facility] have begun to conduct these Wellness Visits which will also include a thorough review of all your medications.    As you primary medical provider recommend that you make an appointment for your Annual Wellness Visit if you have not done so already this year.  You may set up this appointment before you leave today or you may call back ([phone]) and schedule an appointment.  Please make sure when you call that you mention that you are scheduling your Annual Wellness Visit with the clinical pharmacist so that the appointment may be made for the proper length of time.     Continue current medications. Continue good therapeutic lifestyle changes which include good diet and exercise. Fall precautions discussed with patient. If an FOBT was given today- please return it to our front desk. If you are over 79 years old - you may need Prevnar 41 or the adult Pneumonia vaccine.  **Flu shots will be available soon--- please call and schedule a FLU-CLINIC appointment**  After your visit with [place] today you will receive a survey in the mail or online from [employer] regarding your care with [place]. Please take a moment to fill this out. Your feedback is  very important to [place] as you can help [place] better understand your patient needs as well as improve your experience and satisfaction. WE CARE ABOUT YOU!!!   **Please join [place] SEPT.22, 2016 from 5:00 to 7:00pm for our OPEN HOUSE! Come out and meet our NEW providers** The patient should exercise the right arm by making circles and walking up the wall with her hand. She should use warm wet compresses to the shoulder and arm 20 minutes 4 times daily She should also go and get the Dopplers of both legs as discussed and planned. We will call her with these results as soon as they become available If she is trying to to taper the protonix in the future she should consider alternating this with taking Zantac twice a day every other day. Follow-up with hip pain as planned with orthopedic surgeon Continue to dress wound with pressure dressing after taking a shower Consider in the future increasing the Caduet to 10/40 from 10/20. The patient was to continue with her diet at the present time.

## 2015-04-26 NOTE — Progress Notes (Signed)
Subjective:    Patient ID: Melissa Carpenter, female    DOB: 26-Apr-1934, 81 y.o.   MRN: [id number]  HPI Pt here for follow up and management of chronic medical problems which includes hypertension, hyperlipidemia, and GERD. She is taking medications regularly. The patient's lab work was reviewed with her today.The blood sugar slightly elevated at 104. The creatinine, the most important kidney function test remains within normal limits. The electrolytes including potassium are good. All liver function tests are within normal limits Cholesterol numbers with advanced lipid testing have an elevated LDL particle number at 1503. The LDL C is good at 92. The triglycerides are elevated at 194. The HDL particle number or the good cholesterol remains low. Please confirm the current strength of Caduet------ please confirm that she is taking it regularly. Please confirm that she is following aggressive therapeutic lifestyle changes. When she returns for her next visit we should consider increasing CABG wet to 10/40 instead of 10/20.+++++ The vitamin D level is good and she should continue with current treatment The CBC has a normal white blood cell count. The hemoglobin is good at 12.8 and the platelet count is adequate. She does complain of some arthralgias and also because of a cyst that is still draining following this being opened and drained. The patient denies chest pain. She does have some shortness of breath with climbing. She has had some indigestion when trying to go off of her proton pump inhibitor and she is now back on it more regularly. She denies blood in the stool or black tarry bowel movements. She is having no problems passing her water. She does have some right shoulder pain and it is hard to raise her arm. She does not describe any injury. She is also had some bilateral lower leg pain left greater than right and does not describe any injury associated with this. She continues to see the orthopedic  surgeon about her hips and has had injection in her hips.     Patient Active Problem List   Diagnosis Date Noted  . Metabolic syndrome 78/67/6720  . Gastroesophageal reflux disease with hiatal hernia 07/21/2013  . Osteoporosis 03/05/2013  . Tachycardia   . Edema   . Carotid artery disease   . Dyslipidemia   . Mitral valve prolapse   . Ejection fraction   . Aortic insufficiency   . Hypertension   . Chest pain    Outpatient Encounter Prescriptions as of 04/26/2015  Medication Sig  . amlodipine-atorvastatin (CADUET) 10-20 MG per tablet TAKE 1 TABLET BY MOUTH DAILY.  Marland Kitchen aspirin EC 81 MG tablet Take 81 mg by mouth daily.  Marland Kitchen BENICAR 40 MG tablet TAKE 1 TABLET BY MOUTH EVERY DAY  . Calcium Carbonate-Vitamin D (CALTRATE 600+D) 600-400 MG-UNIT per tablet Take 1 tablet by mouth daily.  . cholecalciferol (VITAMIN D) 400 UNITS TABS Take 1,000 Units by mouth.    . fish oil-omega-3 fatty acids 1000 MG capsule Take 1 g by mouth daily.    . metoprolol succinate (TOPROL-XL) 100 MG 24 hr tablet TAKE 1 TABLET (100 MG TOTAL) BY MOUTH DAILY. TAKE WITH OR IMMEDIATELY FOLLOWING A MEAL.  . Multiple Vitamin (MULTIVITAMIN) tablet Take 1 tablet by mouth daily.    . pantoprazole (PROTONIX) 40 MG tablet TAKE 1 TABLET EVERY DAY   No facility-administered encounter medications on file as of 04/26/2015.      Review of Systems  Constitutional: Negative.   HENT: Negative.   Eyes: Negative.   Respiratory: Negative.  Cardiovascular: Negative.   Gastrointestinal: Negative.   Endocrine: Negative.   Genitourinary: Negative.   Musculoskeletal: Positive for arthralgias (right shoulder pain, limited mobility).  Skin: Positive for color change (left lower leg, spots and redness).       MMM removed cyst - recheck area, still draining  Allergic/Immunologic: Negative.   Neurological: Negative.   Hematological: Negative.   Psychiatric/Behavioral: Negative.        Objective:   Physical Exam  Constitutional:  She is oriented to person, place, and time. She appears well-developed and well-nourished. No distress.  HENT:  Head: Normocephalic and atraumatic.  Right Ear: External ear normal.  Left Ear: External ear normal.  Mouth/Throat: Oropharynx is clear and moist.  Slight nasal congestion  Eyes: Conjunctivae and EOM are normal. Pupils are equal, round, and reactive to light. Right eye exhibits no discharge. Left eye exhibits no discharge. No scleral icterus.  Neck: Normal range of motion. Neck supple. No thyromegaly present.  No carotid bruits audible today  Cardiovascular: Normal rate, regular rhythm, normal heart sounds and intact distal pulses.   No murmur heard. At 72/m  Pulmonary/Chest: Effort normal and breath sounds normal. No respiratory distress. She has no wheezes. She has no rales. She exhibits no tenderness.  Clear anteriorly and posteriorly  Abdominal: Soft. Bowel sounds are normal. She exhibits no mass. There is tenderness. There is no rebound and no guarding.  Slight epigastric tenderness  Musculoskeletal: Normal range of motion. She exhibits edema. She exhibits no tenderness.  There is some edema and tenderness bilaterally in the lower legs and calf areas. There is also rubor. The Homans sign was negative. There is also some tenderness at the right before meals joint area and in the deltoid muscle area of the right shoulder.  Lymphadenopathy:    She has no cervical adenopathy.  Neurological: She is alert and oriented to person, place, and time. She has normal reflexes. No cranial nerve deficit.  Skin: Skin is warm and dry. No rash noted.  The recent I&D done by one of the mid levels still had the drain in place on her back. This was removed today and a pressure dressing was applied to the area.  Psychiatric: She has a normal mood and affect. Her behavior is normal. Judgment and thought content normal.  Nursing note and vitals reviewed.  BP 122/62 mmHg  Pulse 70  Temp(Src) 97.8  F (36.6 C) (Oral)  Ht 5' (1.524 m)  Wt 147 lb (66.679 kg)  BMI 28.71 kg/m2  [facility] reading (PRIMARY) by  Dr. Governor Rooks shoulder--  degenerative changes of ac joint and shoulder                                      Assessment & Plan:  1. Essential hypertension -The blood pressure is good today and she will continue with current treatment  2. Hyperlipemia -Cholesterol numbers were elevated and she prefers not to increase the strength of the Caduet to the 10/40 strength at this time.  3. Gastroesophageal reflux disease, esophagitis presence not specified -She is having more trouble with this at the present time and she will stay on a protonix for the time being.  4. Vitamin D deficiency -The vitamin D level is good and she will continue with current treatment  5. Carotid artery disease, unspecified laterality -No carotid bruits were audible today.  6. Right shoulder pain -We will get x-rays  of the right shoulder and for the time being she will use warm wet compresses and do range of motion exercises with the shoulder. - DG Shoulder Right; Future  7. Bilateral leg pain -There was rubor in both lower extremities and some tenderness in the posterior calf on both sides left greater than right. - US Venous Img Lower Bilateral; Future  No orders of the defined types were placed in this encounter.   Patient Instructions                       Medicare Annual Wellness Visit  [facility] and the medical providers at [facility] strive to bring you the best medical care.  In doing so we not only want to address your current medical conditions and concerns but also to detect new conditions early and prevent illness, disease and health-related problems.    Medicare offers a yearly Wellness Visit which allows our clinical staff to assess your need for preventative services including immunizations, lifestyle education, counseling to decrease risk of preventable  diseases and screening for fall risk and other medical concerns.    This visit is provided free of charge (no copay) for all Medicare recipients. The clinical pharmacists at [facility] have begun to conduct these Wellness Visits which will also include a thorough review of all your medications.    As you primary medical provider recommend that you make an appointment for your Annual Wellness Visit if you have not done so already this year.  You may set up this appointment before you leave today or you may call back ([phone]) and schedule an appointment.  Please make sure when you call that you mention that you are scheduling your Annual Wellness Visit with the clinical pharmacist so that the appointment may be made for the proper length of time.     Continue current medications. Continue good therapeutic lifestyle changes which include good diet and exercise. Fall precautions discussed with patient. If an FOBT was given today- please return it to our front desk. If you are over 54 years old - you may need Prevnar 20 or the adult Pneumonia vaccine.  **Flu shots will be available soon--- please call and schedule a FLU-CLINIC appointment**  After your visit with [place] today you will receive a survey in the mail or online from [employer] regarding your care with [place]. Please take a moment to fill this out. Your feedback is very important to [place] as you can help [place] better understand your patient needs as well as improve your experience and satisfaction. WE CARE ABOUT YOU!!!   **Please join [place] SEPT.[email or web address], 2016 from 5:00 to 7:00pm for our OPEN HOUSE! Come out and meet our NEW providers** The patient should exercise the right arm by making circles and walking up the wall with her hand. She should use warm wet compresses to the shoulder and arm 20 minutes 4 times daily She should also go and get the Dopplers of both legs as discussed and planned. We will call her with these results as  soon as they become available If she is trying to to taper the protonix in the future she should consider alternating this with taking Zantac twice a day every other day. Follow-up with hip pain as planned with orthopedic surgeon Continue to dress wound with pressure dressing after taking a shower Consider in the future increasing the Caduet to 10/40 from 10/20. The patient was to continue with  her diet at the present time.   Arrie Senate MD

## 2015-04-28 ENCOUNTER — Ambulatory Visit (HOSPITAL_COMMUNITY)
Admission: RE | Admit: 2015-04-28 | Discharge: 2015-04-28 | Disposition: A | Discharge status: Home / Self Care | Payer: Medicare Other | Source: Ambulatory Visit | Attending: Family Medicine | Admitting: Family Medicine

## 2015-04-28 ENCOUNTER — Other Ambulatory Visit: Payer: Self-pay | Admitting: *Deleted

## 2015-04-28 DIAGNOSIS — M79662 Pain in left lower leg: Secondary | ICD-10-CM | POA: Diagnosis not present

## 2015-04-28 DIAGNOSIS — M79605 Pain in left leg: Secondary | ICD-10-CM | POA: Diagnosis not present

## 2015-04-28 DIAGNOSIS — M79661 Pain in right lower leg: Secondary | ICD-10-CM | POA: Diagnosis not present

## 2015-04-28 DIAGNOSIS — M79604 Pain in right leg: Secondary | ICD-10-CM | POA: Insufficient documentation

## 2015-04-28 MED ORDER — CEPHALEXIN 500 MG PO CAPS: 500.0000 mg | ORAL_CAPSULE | Freq: Three times a day (TID) | ORAL | Status: DC

## 2015-04-28 MED ORDER — FUROSEMIDE 20 MG PO TABS: 20.0000 mg | ORAL_TABLET | Freq: Every day | ORAL | Status: DC

## 2015-05-03 ENCOUNTER — Other Ambulatory Visit: Payer: Self-pay | Admitting: Family Medicine

## 2015-05-07 ENCOUNTER — Other Ambulatory Visit: Payer: Medicare Other

## 2015-05-07 DIAGNOSIS — Z1212 Encounter for screening for malignant neoplasm of rectum: Secondary | ICD-10-CM | POA: Diagnosis not present

## 2015-05-07 NOTE — Progress Notes (Signed)
Lab only 

## 2015-05-08 LAB — FECAL OCCULT BLOOD, IMMUNOCHEMICAL: Fecal Occult Bld: NEGATIVE

## 2015-05-10 ENCOUNTER — Encounter: Payer: Self-pay | Admitting: Family Medicine

## 2015-05-10 ENCOUNTER — Ambulatory Visit (INDEPENDENT_AMBULATORY_CARE_PROVIDER_SITE_OTHER): Payer: Medicare Other | Admitting: Family Medicine

## 2015-05-10 VITALS — BP 115/62 | HR 61 | Temp 98.5°F | Ht 60.0 in | Wt 148.0 lb

## 2015-05-10 DIAGNOSIS — L03115 Cellulitis of right lower limb: Secondary | ICD-10-CM

## 2015-05-10 DIAGNOSIS — L03116 Cellulitis of left lower limb: Secondary | ICD-10-CM | POA: Diagnosis not present

## 2015-05-10 DIAGNOSIS — I10 Essential (primary) hypertension: Secondary | ICD-10-CM | POA: Diagnosis not present

## 2015-05-10 DIAGNOSIS — I6523 Occlusion and stenosis of bilateral carotid arteries: Secondary | ICD-10-CM

## 2015-05-10 MED ORDER — CEPHALEXIN 500 MG PO CAPS: 500.0000 mg | ORAL_CAPSULE | Freq: Three times a day (TID) | ORAL | Status: DC

## 2015-05-10 NOTE — Progress Notes (Signed)
Subjective:    Patient ID: Melissa Carpenter, female    DOB: 1934-03-09, 81 y.o.   MRN: [id number]  HPI Patient here today for 2 week follow up on bilateral leg tenderness and rubor. She states that they still bother her, but are somewhat better. The patient had lower extremity Dopplers bilaterally and both of these were negative for DVT. She has been taking furosemide 20 mg regularly. The lower legs remain painful but not quite as much as previously. She did complete taking the Keflex. She did not try any above the knee hose but did wear some below the knee support hose and these made her legs more painful.        Patient Active Problem List   Diagnosis Date Noted  . Metabolic syndrome 08/65/7846  . Gastroesophageal reflux disease with hiatal hernia 07/21/2013  . Osteoporosis 03/05/2013  . Tachycardia   . Edema   . Carotid artery disease   . Dyslipidemia   . Mitral valve prolapse   . Ejection fraction   . Aortic insufficiency   . Hypertension   . Chest pain    Outpatient Encounter Prescriptions as of 05/10/2015  Medication Sig  . amlodipine-atorvastatin (CADUET) 10-20 MG per tablet TAKE 1 TABLET BY MOUTH DAILY.  Marland Kitchen aspirin EC 81 MG tablet Take 81 mg by mouth daily.  Marland Kitchen BENICAR 40 MG tablet TAKE 1 TABLET BY MOUTH EVERY DAY  . Calcium Carbonate-Vitamin D (CALTRATE 600+D) 600-400 MG-UNIT per tablet Take 1 tablet by mouth daily.  . cholecalciferol (VITAMIN D) 400 UNITS TABS Take 1,000 Units by mouth.    . fish oil-omega-3 fatty acids 1000 MG capsule Take 1 g by mouth daily.    . furosemide (LASIX) 20 MG tablet Take 1 tablet (20 mg total) by mouth daily.  . metoprolol succinate (TOPROL-XL) 100 MG 24 hr tablet TAKE 1 TABLET (100 MG TOTAL) BY MOUTH DAILY. TAKE WITH OR IMMEDIATELY FOLLOWING A MEAL.  . Multiple Vitamin (MULTIVITAMIN) tablet Take 1 tablet by mouth daily.    . pantoprazole (PROTONIX) 40 MG tablet TAKE 1 TABLET EVERY DAY  . [DISCONTINUED] cephALEXin (KEFLEX) 500 MG capsule Take  1 capsule (500 mg total) by mouth 3 (three) times daily.   No facility-administered encounter medications on file as of 05/10/2015.      Review of Systems  Constitutional: Negative.   HENT: Negative.   Eyes: Negative.   Respiratory: Negative.   Cardiovascular: Negative.   Gastrointestinal: Negative.   Endocrine: Negative.   Genitourinary: Negative.   Musculoskeletal: Negative.        Still having bilateral leg tenderness  Skin: Negative.   Allergic/Immunologic: Negative.   Neurological: Negative.   Hematological: Negative.   Psychiatric/Behavioral: Negative.        Objective:   Physical Exam  Constitutional: She is oriented to person, place, and time. She appears well-developed and well-nourished. No distress.  HENT:  Head: Normocephalic.  Eyes: Conjunctivae and EOM are normal. Pupils are equal, round, and reactive to light. Right eye exhibits no discharge. Left eye exhibits no discharge. No scleral icterus.  Neck: Normal range of motion.  Cardiovascular: Intact distal pulses.   Musculoskeletal: Normal range of motion. She exhibits tenderness. She exhibits no edema.  There is some tenderness and induration of both legs and some rubor.  Neurological: She is oriented to person, place, and time.  Skin: No rash noted. There is erythema. No pallor.  Psychiatric: She has a normal mood and affect. Her behavior is normal. Thought content  normal.  Nursing note and vitals reviewed.  BP 115/62 mmHg  Pulse 61  Temp(Src) 98.5 F (36.9 C) (Oral)  Ht 5' (1.524 m)  Wt 148 lb (67.132 kg)  BMI 28.90 kg/m2        Assessment & Plan:  1. Essential hypertension -The blood pressure is good today and she will continue with current treatment  2. Cellulitis of leg, left -She will start taking an aspirin 325 coated twice daily after breakfast and supper and we will refill the cephalexin and have her take this this time only twice daily instead of 3 times daily. -We will also get an  appointment for her to follow-up with the vascular surgeon in [facility] for further evaluation -The recent Dopplers were reviewed with her. She will take a copy of this with her to see the vascular surgeon.  3. Cellulitis of leg, right -As noted above under left leg  Meds ordered this encounter  Medications  . cephALEXin (KEFLEX) 500 MG capsule    Sig: Take 1 capsule (500 mg total) by mouth 3 (three) times daily.    Dispense:  30 capsule    Refill:  0   Patient Instructions  The patient should continue to take the Lasix 20 mg 1 daily or every other day as needed for swelling She should take an adult aspirin that is coated 325 mg twice daily We will refill the antibiotic She should try to get some above the knee support hose and put these on the first thing with arising in the morning We will arrange for her to see a vascular surgeon to follow-up on this discomfort in her lower extremities.   Arrie Senate MD

## 2015-05-10 NOTE — Addendum Note (Signed)
Addended by: Zannie Cove on: 05/10/2015 10:43 AM   Modules accepted: Orders

## 2015-05-10 NOTE — Patient Instructions (Addendum)
The patient should continue to take the Lasix 20 mg 1 daily or every other day as needed for swelling She should take an adult aspirin that is coated 325 mg twice daily We will refill the antibiotic She should try to get some above the knee support hose and put these on the first thing with arising in the morning We will arrange for her to see a vascular surgeon to follow-up on this discomfort in her lower extremities.

## 2015-05-17 ENCOUNTER — Telehealth: Payer: Self-pay | Admitting: Family Medicine

## 2015-05-19 NOTE — Telephone Encounter (Signed)
Pt states that appt with vein and vasular MD was scheduled for December. She is not sure that she should wait that long

## 2015-05-19 NOTE — Telephone Encounter (Signed)
[facility] with [facility] Triage nurse

## 2015-05-20 ENCOUNTER — Other Ambulatory Visit: Payer: Self-pay | Admitting: *Deleted

## 2015-05-20 DIAGNOSIS — R609 Edema, unspecified: Secondary | ICD-10-CM

## 2015-05-20 NOTE — Telephone Encounter (Signed)
Appointment moved to 05/28/15; Pt aware of new date/time

## 2015-05-21 ENCOUNTER — Ambulatory Visit (INDEPENDENT_AMBULATORY_CARE_PROVIDER_SITE_OTHER): Payer: Medicare Other

## 2015-05-21 ENCOUNTER — Encounter: Payer: Self-pay | Admitting: Family Medicine

## 2015-05-21 ENCOUNTER — Ambulatory Visit (INDEPENDENT_AMBULATORY_CARE_PROVIDER_SITE_OTHER): Payer: Medicare Other | Admitting: Family Medicine

## 2015-05-21 VITALS — BP 132/62 | HR 71 | Temp 97.8°F | Ht 60.0 in | Wt 149.0 lb

## 2015-05-21 DIAGNOSIS — M5442 Lumbago with sciatica, left side: Secondary | ICD-10-CM

## 2015-05-21 DIAGNOSIS — I8311 Varicose veins of right lower extremity with inflammation: Secondary | ICD-10-CM

## 2015-05-21 DIAGNOSIS — R059 Cough, unspecified: Secondary | ICD-10-CM

## 2015-05-21 DIAGNOSIS — I8312 Varicose veins of left lower extremity with inflammation: Secondary | ICD-10-CM | POA: Diagnosis not present

## 2015-05-21 DIAGNOSIS — I872 Venous insufficiency (chronic) (peripheral): Secondary | ICD-10-CM

## 2015-05-21 DIAGNOSIS — R1310 Dysphagia, unspecified: Secondary | ICD-10-CM

## 2015-05-21 DIAGNOSIS — R05 Cough: Secondary | ICD-10-CM | POA: Diagnosis not present

## 2015-05-21 DIAGNOSIS — I6523 Occlusion and stenosis of bilateral carotid arteries: Secondary | ICD-10-CM

## 2015-05-21 IMAGING — CR DG CHEST 2V
2 series · 2 of 2 positions shown · non-contrast
Comparison: Chest x-ray of [DATE]

CLINICAL DATA: Cough, history of cardiomegaly, aortic insufficiency
and mitral valve prolapse

EXAM:
CHEST  2 VIEW

[view not recorded (1 of 2)]
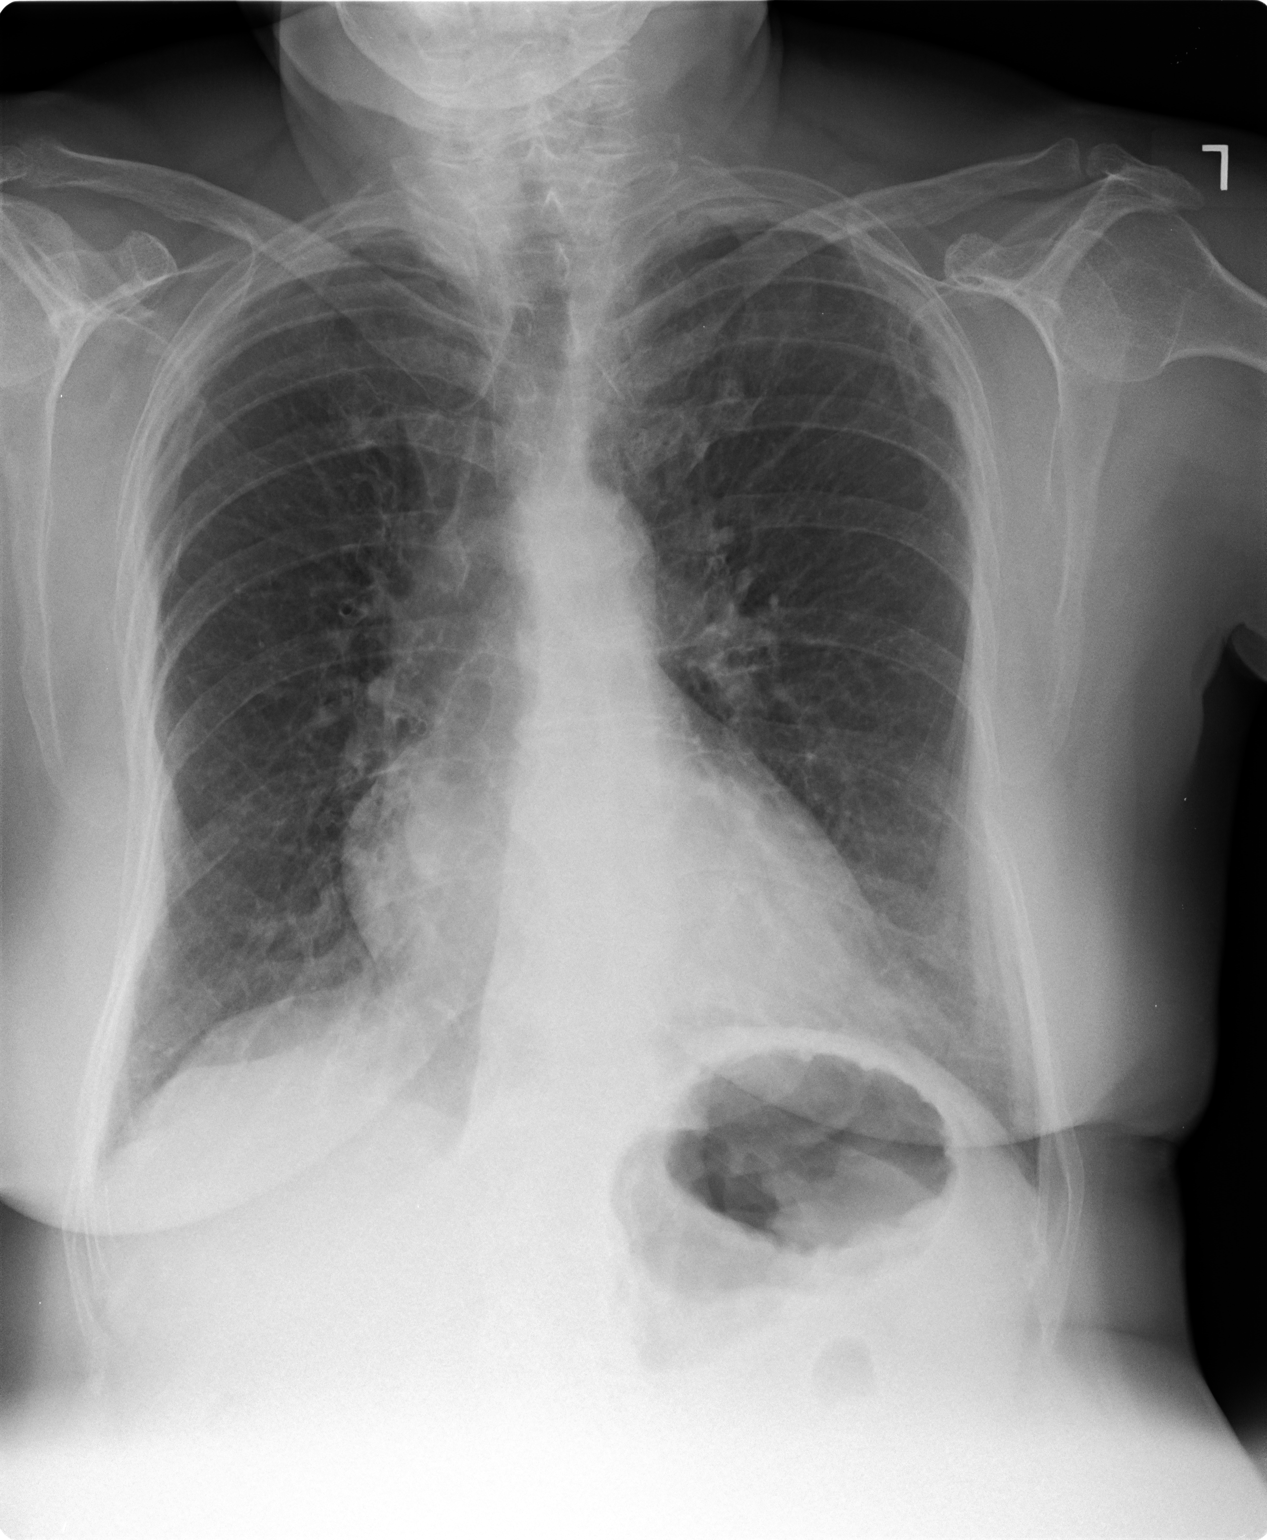

[view not recorded (2 of 2)]
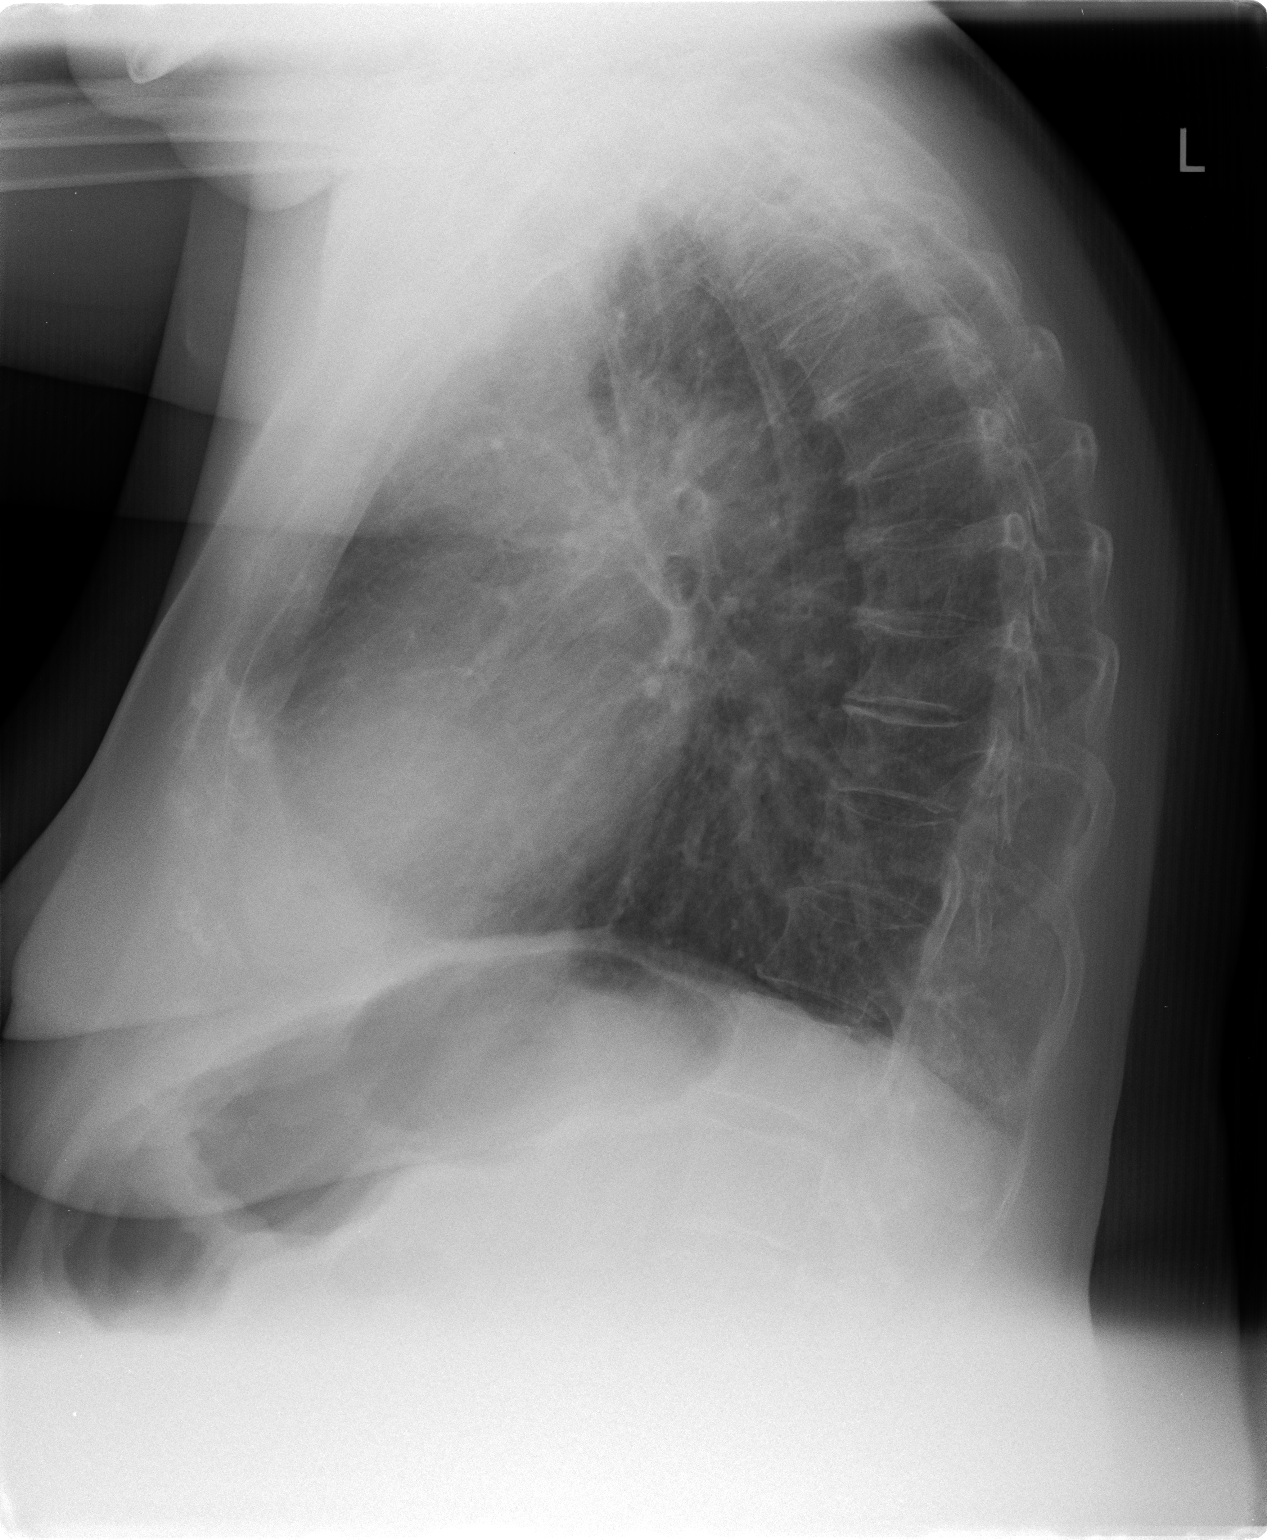

[2 of 2 positions shown; findings below may reference images not displayed]

FINDINGS: The lungs are adequately inflated. There is no focal infiltrate. The
interstitial markings are coarse bilaterally but stable. The heart
is top-normal in size. The pulmonary vascularity is normal. The
mediastinum is normal in width. The bony thorax exhibits no acute
abnormality.
IMPRESSION: COPD and chronic pulmonary parenchymal fibrosis. There is no acute
cardiopulmonary abnormality.

## 2015-05-21 IMAGING — CR DG LUMBAR SPINE 2-3V
2 series · 2 of 2 positions shown · non-contrast
Comparison: Lumbar spine films of [DATE]

CLINICAL DATA: Low back pain, no injury

EXAM:
LUMBAR SPINE - 2-3 VIEW

[view not recorded (1 of 2)]
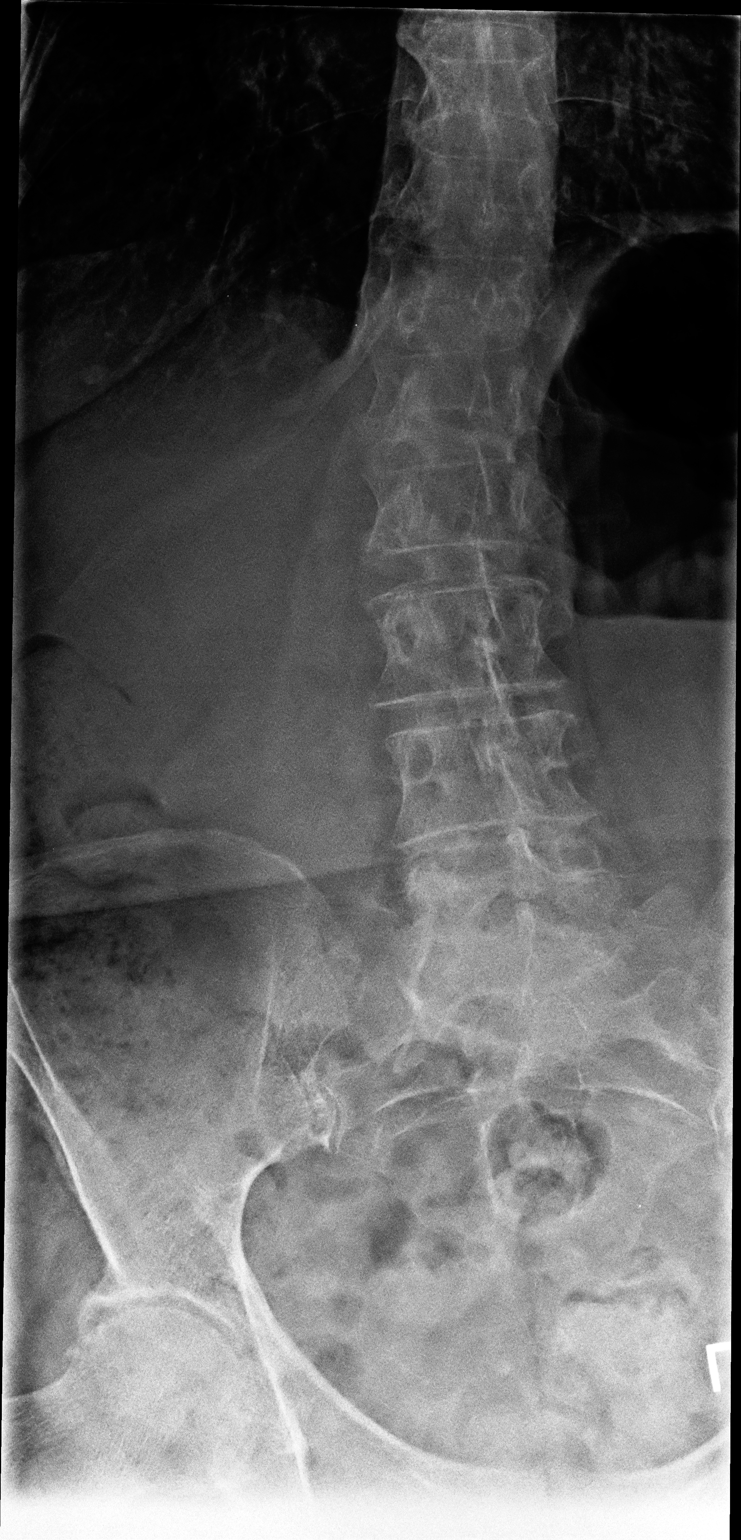

[view not recorded (2 of 2)]
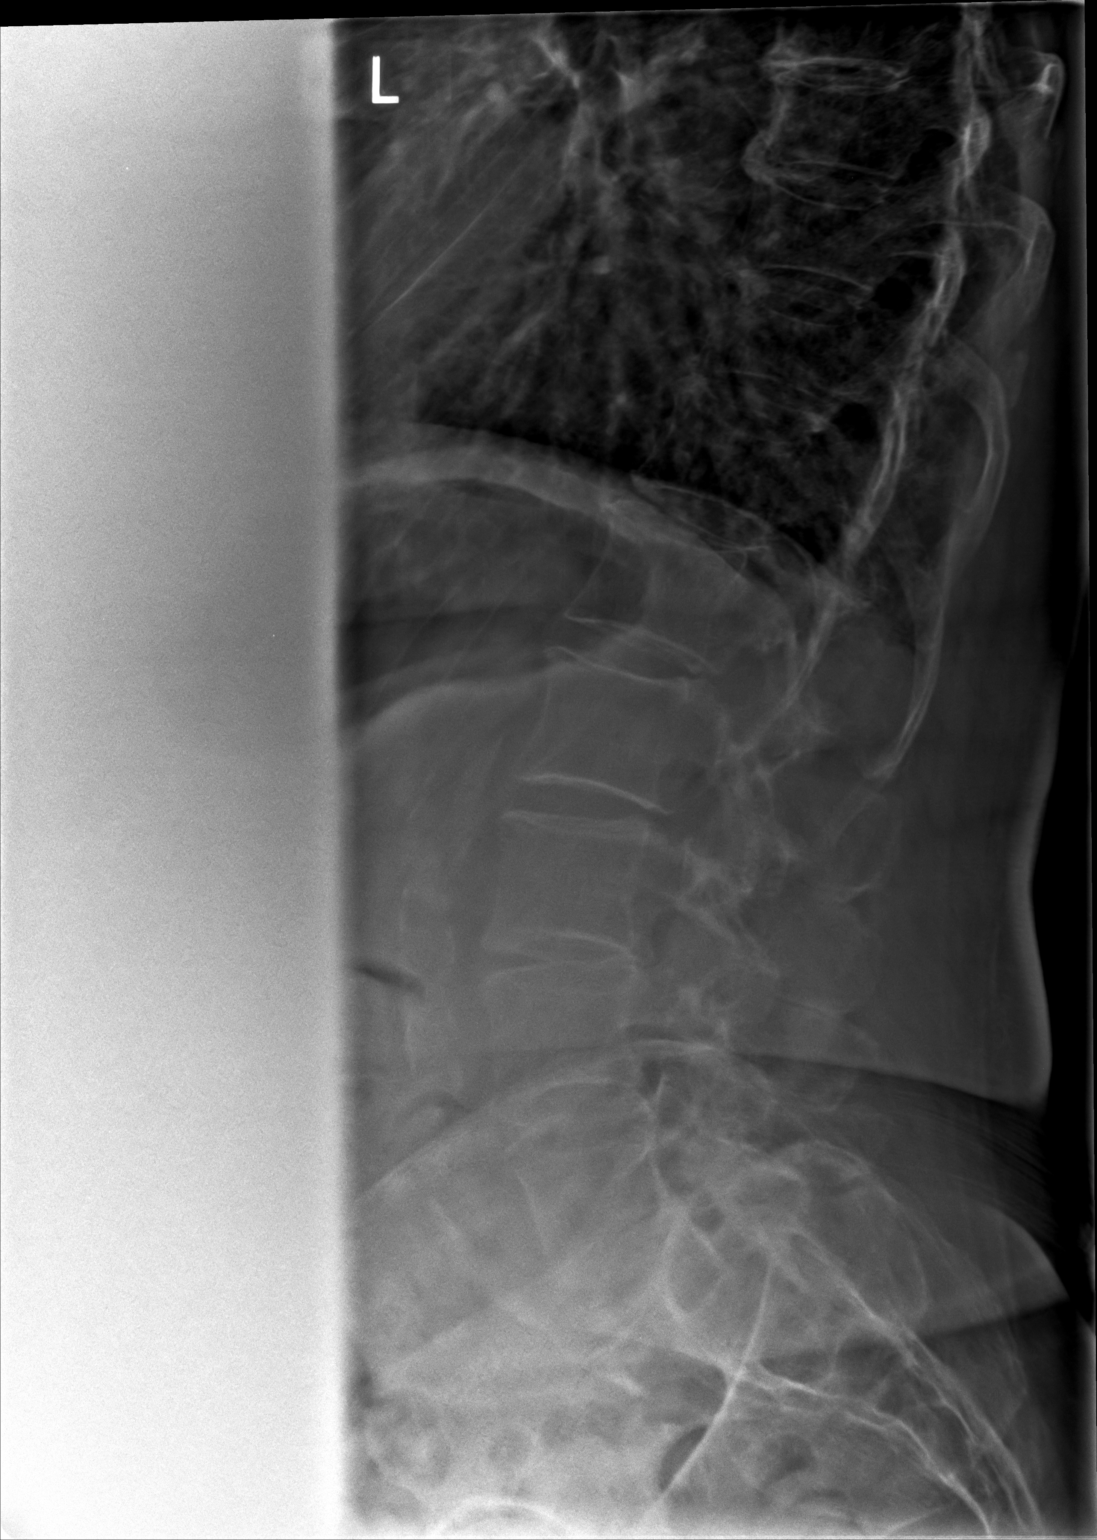

[2 of 2 positions shown; findings below may reference images not displayed]

FINDINGS: Mild lumbar curvature convex to the right is stable measuring only
10 degrees. On the lateral view the lumbar vertebrae are in normal
alignment with relatively normal intervertebral disc spaces. No
compression deformity is seen.
IMPRESSION: Mild lumbar curvature convex to the right. Normal intervertebral
disc spaces.

## 2015-05-21 MED ORDER — CEPHALEXIN 500 MG PO CAPS: 500.0000 mg | ORAL_CAPSULE | Freq: Two times a day (BID) | ORAL | Status: DC

## 2015-05-21 NOTE — Progress Notes (Signed)
Subjective:    Patient ID: Melissa Carpenter, female    DOB: 30-Aug-1933, 81 y.o.   MRN: [id number]  HPI Patient here today for cough and trouble "clearing her throat". This started bothering her about 1 year ago. The patient has been having trouble with a cough and clearing her throat for many many years but it this seems to be getting worse. It is worse on the right side of the throat. She does not have any problems with reflux as she continues to take her pantoprazole. She is also has some left low back pain with no history of any injury. She also continues to have the ongoing cellulitis/stasis dermatitis in her legs with the left being worse than the right. This is some better with taking Keflex and an aspirin twice a day. She does have an appointment scheduled with the vascular surgeon for further evaluation of this in about 1 week. She mentions today that her mother had cancer of the vocal cords. The patient says that a lot of cancer runs in her family. The patient's last chest x-ray was a little over one year ago and this showed some chronic interstitial changes and cardiac enlargement.        Patient Active Problem List   Diagnosis Date Noted  . Metabolic syndrome 29/51/8841  . Gastroesophageal reflux disease with hiatal hernia 07/21/2013  . Osteoporosis 03/05/2013  . Tachycardia   . Edema   . Carotid artery disease ([place])   . Dyslipidemia   . Mitral valve prolapse   . Ejection fraction   . Aortic insufficiency   . Hypertension   . Chest pain    Outpatient Encounter Prescriptions as of 05/21/2015  Medication Sig  . amlodipine-atorvastatin (CADUET) 10-20 MG per tablet TAKE 1 TABLET BY MOUTH DAILY.  Marland Kitchen aspirin 325 MG tablet Take 325 mg by mouth 2 (two) times daily.  Marland Kitchen BENICAR 40 MG tablet TAKE 1 TABLET BY MOUTH EVERY DAY  . Calcium Carbonate-Vitamin D (CALTRATE 600+D) 600-400 MG-UNIT per tablet Take 1 tablet by mouth daily.  . cephALEXin (KEFLEX) 500 MG capsule Take 1 capsule (500 mg  total) by mouth 3 (three) times daily.  . cholecalciferol (VITAMIN D) 400 UNITS TABS Take 1,000 Units by mouth.    . fish oil-omega-3 fatty acids 1000 MG capsule Take 1 g by mouth daily.    . furosemide (LASIX) 20 MG tablet Take 1 tablet (20 mg total) by mouth daily.  . metoprolol succinate (TOPROL-XL) 100 MG 24 hr tablet TAKE 1 TABLET (100 MG TOTAL) BY MOUTH DAILY. TAKE WITH OR IMMEDIATELY FOLLOWING A MEAL.  . Multiple Vitamin (MULTIVITAMIN) tablet Take 1 tablet by mouth daily.    . pantoprazole (PROTONIX) 40 MG tablet TAKE 1 TABLET EVERY DAY  . [DISCONTINUED] aspirin EC 81 MG tablet Take 81 mg by mouth daily.   No facility-administered encounter medications on file as of 05/21/2015.      Review of Systems  Constitutional: Negative.   HENT: Negative.  Negative for trouble swallowing.   Eyes: Negative.   Respiratory: Positive for cough.   Cardiovascular: Negative.   Gastrointestinal: Negative.   Endocrine: Negative.   Genitourinary: Negative.   Musculoskeletal: Positive for back pain (low).  Skin: Negative.   Allergic/Immunologic: Negative.   Neurological: Negative.   Hematological: Negative.   Psychiatric/Behavioral: Negative.        Objective:   Physical Exam  Constitutional: She is oriented to person, place, and time. She appears well-developed and well-nourished. No distress.  HENT:  Head: Normocephalic and atraumatic.  Right Ear: External ear normal.  Left Ear: External ear normal.  Nose: Nose normal.  Mouth/Throat: Oropharynx is clear and moist. No oropharyngeal exudate.  No obvious abnormalities posterior throat  Eyes: Conjunctivae and EOM are normal. Pupils are equal, round, and reactive to light. Right eye exhibits no discharge. Left eye exhibits no discharge. No scleral icterus.  Neck: Normal range of motion. Neck supple. No thyromegaly present.  No adenopathy or thyromegaly but patient does have a left supraclavicular bruit  Cardiovascular: Normal rate, regular  rhythm and normal heart sounds.  Exam reveals no gallop and no friction rub.   No murmur heard. Pulmonary/Chest: Effort normal and breath sounds normal. No respiratory distress. She has no wheezes. She has no rales. She exhibits no tenderness.  Clear anteriorly and posteriorly with dry cough  Musculoskeletal: Normal range of motion. She exhibits edema. She exhibits no tenderness.  There is some mild edema and rubor in both lower extremities left being greater than right  Lymphadenopathy:    She has no cervical adenopathy.  Neurological: She is alert and oriented to person, place, and time.  Skin: Skin is warm and dry. No rash noted. There is erythema. No pallor.  Psychiatric: She has a normal mood and affect. Her behavior is normal. Judgment and thought content normal.  Nursing note and vitals reviewed.   BP 132/62 mmHg  Pulse 71  Temp(Src) 97.8 F (36.6 C) (Oral)  Ht 5' (1.524 m)  Wt 149 lb (67.586 kg)  BMI 29.10 kg/m2  [facility] reading (PRIMARY) by  Dr. Brunilda Payor x-ray and LS spine--  scoliosis and low back with degenerative changes, chest enlarged heart                                     Assessment & Plan:  1. Venous stasis dermatitis of both lower extremities -Patient has follow-up appointment with vascular surgeon in one week - cephALEXin (KEFLEX) 500 MG capsule; Take 1 capsule (500 mg total) by mouth 2 (two) times daily.  Dispense: 14 capsule; Refill: 0  2. Cough -She will have a referral to ear nose and throat specialist because of this persistent problem with coughing and the family history of throat cancer - DG Chest 2 View; Future  3. Left-sided low back pain with left-sided sciatica -Take Tylenol for now as needed for pain and use warm wet compresses to low back - DG Lumbar Spine 2-3 Views; Future  Patient Instructions  We will make an appointment with an ear nose and throat specialist to further evaluate your throat with direct vision. We will get a repeat chest  x-ray today since it has been over a year because of the cough and clearing the throat problem We will also get LS spine films because of the left low back pain You will continue with the Keflex that you have been on until you see the vascular surgeon You will continue with the aspirin twice daily until you see the vascular surgeon   Arrie Senate MD

## 2015-05-21 NOTE — Patient Instructions (Signed)
We will make an appointment with an ear nose and throat specialist to further evaluate your throat with direct vision. We will get a repeat chest x-ray today since it has been over a year because of the cough and clearing the throat problem We will also get LS spine films because of the left low back pain You will continue with the Keflex that you have been on until you see the vascular surgeon You will continue with the aspirin twice daily until you see the vascular surgeon

## 2015-05-26 ENCOUNTER — Encounter: Payer: Self-pay | Admitting: Vascular Surgery

## 2015-05-28 ENCOUNTER — Encounter: Payer: Self-pay | Admitting: Vascular Surgery

## 2015-05-28 ENCOUNTER — Ambulatory Visit (HOSPITAL_COMMUNITY)
Admission: RE | Admit: 2015-05-28 | Discharge: 2015-05-28 | Disposition: A | Discharge status: Home / Self Care | Payer: Medicare Other | Source: Ambulatory Visit | Attending: Vascular Surgery | Admitting: Vascular Surgery

## 2015-05-28 ENCOUNTER — Ambulatory Visit (INDEPENDENT_AMBULATORY_CARE_PROVIDER_SITE_OTHER): Payer: Medicare Other | Admitting: Vascular Surgery

## 2015-05-28 ENCOUNTER — Other Ambulatory Visit: Payer: Self-pay | Admitting: Vascular Surgery

## 2015-05-28 VITALS — BP 135/70 | HR 60 | Temp 96.9°F | Resp 14 | Ht 60.0 in | Wt 147.0 lb

## 2015-05-28 DIAGNOSIS — M79605 Pain in left leg: Secondary | ICD-10-CM | POA: Insufficient documentation

## 2015-05-28 DIAGNOSIS — M79604 Pain in right leg: Secondary | ICD-10-CM | POA: Insufficient documentation

## 2015-05-28 DIAGNOSIS — L03115 Cellulitis of right lower limb: Secondary | ICD-10-CM | POA: Insufficient documentation

## 2015-05-28 DIAGNOSIS — M79606 Pain in leg, unspecified: Secondary | ICD-10-CM | POA: Diagnosis present

## 2015-05-28 DIAGNOSIS — I890 Lymphedema, not elsewhere classified: Secondary | ICD-10-CM | POA: Diagnosis not present

## 2015-05-28 DIAGNOSIS — L03116 Cellulitis of left lower limb: Secondary | ICD-10-CM

## 2015-05-28 DIAGNOSIS — R609 Edema, unspecified: Secondary | ICD-10-CM

## 2015-05-28 DIAGNOSIS — I6523 Occlusion and stenosis of bilateral carotid arteries: Secondary | ICD-10-CM

## 2015-05-28 DIAGNOSIS — I872 Venous insufficiency (chronic) (peripheral): Secondary | ICD-10-CM | POA: Diagnosis not present

## 2015-05-28 NOTE — Progress Notes (Signed)
[facility]  Patient name: Melissa Carpenter MRN: [id number] DOB: 01/28/1934 Sex: female  REASON FOR CONSULT: Bilateral lower extremity cellulitis referred by Dr. Laurance Flatten.  HPI: Melissa Carpenter is a 81 y.o. female, who is who is had some problems with left lower extremity swelling. She also developed cellulitis in the left lower extremity was treated with Keflex for several weeks. She is finished that and the redness has resolved. She is unaware of any previous history of DVT or phlebitis. I do not get any clear-cut history of claudication, rest pain, or nonhealing ulcers. She denies any significant trauma to the left lower extremity.  I have reviewed her records from [facility]. She has been treated with bilateral leg tenderness and rubor. Doppler studies have been negative for DVT. There was a study dated 04/28/2015 which showed no evidence of DVT bilaterally.She also has a history of cellulitis of the left leg. She has essential hypertension which has been well controlled.  Past Medical History  Diagnosis Date  . Shortness of breath   . Carotid artery disease ([place])     , Doppler, 2003  . Dyslipidemia   . Mitral valve prolapse     Echo, 2009, very mild intermittent prolapse of the posterior leaflet, no MR  . Ejection fraction     EF 60-70%, echo, December, 2009 / EF 55-60%, December, 2012  . Aortic insufficiency     Mild, echo, December, 2009  . Hypertension   . DJD (degenerative joint disease)   . Chest pain     Catheterization, 2003, no significant CAD  . Edema     November, 2012  . Diastolic dysfunction     Mild diastolic dysfunction, echo, December, 2012  . Tachycardia     Nighttime tachycardia, February, 2014  . Cataract   . GERD (gastroesophageal reflux disease)   . Hyperlipidemia   . Osteoporosis   . Thyroid nodule   . Cellulitis     Family History  Problem Relation Age of Onset  . Heart attack Mother   . Cancer  Mother     THROAT / VOCAL CORD  . Hypertension Mother   . Hypertension Father   . Bone cancer Sister   . Stroke Sister   . Lung cancer Grandchild   . Heart attack Brother   . Cirrhosis Brother   . Cancer Sister     BREAST / BONE  . Hyperlipidemia Sister   . Heart attack Sister     SOCIAL HISTORY: Social History   Social History  . Marital Status: Married    Spouse Name: N/A  . Number of Children: N/A  . Years of Education: N/A   Occupational History  . Not on file.   Social History Main Topics  . Smoking status: Never Smoker   . Smokeless tobacco: Never Used  . Alcohol Use: No  . Drug Use: No  . Sexual Activity: No   Other Topics Concern  . Not on file   Social History Narrative    Allergies  Allergen Reactions  . Forteo [Teriparatide (Recombinant)] Other (See Comments)    Weakness and calcium increase  . Codeine Hives  . Evista [Raloxifene] Other (See Comments)    Eye problems  . Morphine   . Penicillins Rash  . Sulfites Rash  . Sulfonamide Derivatives Rash    Current Outpatient Prescriptions  Medication Sig Dispense Refill  . amlodipine-atorvastatin (CADUET) 10-20 MG per tablet TAKE 1 TABLET BY MOUTH  DAILY. 90 tablet 0  . BENICAR 40 MG tablet TAKE 1 TABLET BY MOUTH EVERY DAY 90 tablet 1  . Calcium Carbonate-Vitamin D (CALTRATE 600+D) 600-400 MG-UNIT per tablet Take 1 tablet by mouth daily.    . cholecalciferol (VITAMIN D) 400 UNITS TABS Take 1,000 Units by mouth.      . fish oil-omega-3 fatty acids 1000 MG capsule Take 1 g by mouth daily.      . metoprolol succinate (TOPROL-XL) 100 MG 24 hr tablet TAKE 1 TABLET (100 MG TOTAL) BY MOUTH DAILY. TAKE WITH OR IMMEDIATELY FOLLOWING A MEAL. 90 tablet 1  . Multiple Vitamin (MULTIVITAMIN) tablet Take 1 tablet by mouth daily.      . pantoprazole (PROTONIX) 40 MG tablet TAKE 1 TABLET EVERY DAY 90 tablet 1  . aspirin 325 MG tablet Take 325 mg by mouth 2 (two) times daily.    . cephALEXin (KEFLEX) 500 MG capsule  Take 1 capsule (500 mg total) by mouth 2 (two) times daily. (Patient not taking: Reported on 05/28/2015) 14 capsule 0  . furosemide (LASIX) 20 MG tablet Take 1 tablet (20 mg total) by mouth daily. (Patient not taking: Reported on 05/28/2015) 30 tablet 3   No current facility-administered medications for this visit.    REVIEW OF SYSTEMS:  [X]  denotes positive finding, [ ]  denotes negative finding Cardiac  Comments:  Chest pain or chest pressure:    Shortness of breath upon exertion:    Short of breath when lying flat:    Irregular heart rhythm:        Vascular    Pain in calf, thigh, or hip brought on by ambulation:    Pain in feet at night that wakes you up from your sleep:     Blood clot in your veins:    Leg swelling:  X Left leg more than the right leg.      Pulmonary    Oxygen at home:    Productive cough:     Wheezing:         Neurologic    Sudden weakness in arms or legs:     Sudden numbness in arms or legs:     Sudden onset of difficulty speaking or slurred speech:    Temporary loss of vision in one eye:     Problems with dizziness:         Gastrointestinal    Blood in stool:     Vomited blood:         Genitourinary    Burning when urinating:     Blood in urine:        Psychiatric    Major depression:         Hematologic    Bleeding problems:    Problems with blood clotting too easily:        Skin    Rashes or ulcers:        Constitutional    Fever or chills:      PHYSICAL EXAM: Filed Vitals:   05/28/15 1255  BP: 135/70  Pulse: 60  Temp: 96.9 F (36.1 C)  Resp: 14  Height: 5' (1.524 m)  Weight: 147 lb (66.679 kg)  SpO2: 99%    GENERAL: The patient is a well-nourished female, in no acute distress. The vital signs are documented above. CARDIAC: There is a regular rate and rhythm.  VASCULAR: she does have soft bilateral carotid bruits. She has palpable femoral, dorsalis pedis, and posterior tibial pulses bilaterally. She has  mild bilateral  lower extremity swelling which is more significant on the left side. PULMONARY: There is good air exchange bilaterally without wheezing or rales. ABDOMEN: Soft and non-tender with normal pitched bowel sounds.  MUSCULOSKELETAL: There are no major deformities or cyanosis. NEUROLOGIC: No focal weakness or paresthesias are detected. SKIN: she does have hyperpigmentation bilaterally consistent with chronic venous insufficiency. PSYCHIATRIC: The patient has a normal affect.  DATA:   BILATERAL LOWER EXTREMITY VENOUS DUPLEX:  On the right side, there is no evidence of DVT. There is no evidence of deep vein reflux or superficial reflux.  On the left side, there is no evidence of DVT. There is no evidence of deep vein reflux or superficial vein reflux.  MEDICAL ISSUES:  LEFT LEG SWELLING: Based on her exam, she does have evidence of chronic venous insufficiency. However, duplex was fairly unremarkable. Regardless, I think her swelling is likely a combination of chronic venous insufficiency and lymphedema. I have explained that the treatment for these is exactly the same. We discussed the importance of intermittent leg elevation and the proper positioning for this. In addition I written her a prescription for knee-high compression stockings with a gradient of 15-20 mmHg. I have encouraged her to walk as much as possible and to avoid prolonged sitting and standing. I'll be happy to see her back at any time if any new vascular issues arise.  BILATERAL CAROTID BRUITS: She has history of mild carotid disease and is followed by Dr. Ron Parker. Her most recent study showed a less than 30% left carotid stenosis with a 4059% right carotid stenosis. She will continue her follow up with Dr. Ron Parker.   Deitra Mayo [facility] [place]: [phone]

## 2015-05-31 DIAGNOSIS — R05 Cough: Secondary | ICD-10-CM | POA: Diagnosis not present

## 2015-05-31 DIAGNOSIS — K210 Gastro-esophageal reflux disease with esophagitis: Secondary | ICD-10-CM | POA: Diagnosis not present

## 2015-06-08 ENCOUNTER — Ambulatory Visit (INDEPENDENT_AMBULATORY_CARE_PROVIDER_SITE_OTHER): Payer: Medicare Other

## 2015-06-08 DIAGNOSIS — Z23 Encounter for immunization: Secondary | ICD-10-CM

## 2015-06-15 ENCOUNTER — Other Ambulatory Visit: Payer: Self-pay | Admitting: Nurse Practitioner

## 2015-07-16 ENCOUNTER — Encounter: Payer: Medicare Other | Admitting: Vascular Surgery

## 2015-07-16 ENCOUNTER — Encounter (HOSPITAL_COMMUNITY): Payer: Medicare Other

## 2015-09-07 ENCOUNTER — Other Ambulatory Visit: Payer: Medicare Other

## 2015-09-07 DIAGNOSIS — Z0000 Encounter for general adult medical examination without abnormal findings: Secondary | ICD-10-CM | POA: Diagnosis not present

## 2015-09-07 DIAGNOSIS — I10 Essential (primary) hypertension: Secondary | ICD-10-CM

## 2015-09-07 DIAGNOSIS — E785 Hyperlipidemia, unspecified: Secondary | ICD-10-CM

## 2015-09-07 DIAGNOSIS — E559 Vitamin D deficiency, unspecified: Secondary | ICD-10-CM

## 2015-09-07 NOTE — Progress Notes (Signed)
Lab only 

## 2015-09-08 LAB — HEPATIC FUNCTION PANEL
ALK PHOS: 103 IU/L (ref 39–117)
ALT: 26 IU/L (ref 0–32)
AST: 29 IU/L (ref 0–40)
Albumin: 4.7 g/dL (ref 3.5–4.8)
BILIRUBIN TOTAL: 0.5 mg/dL (ref 0.0–1.2)
BILIRUBIN, DIRECT: 0.11 mg/dL (ref 0.00–0.40)
Total Protein: 7.2 g/dL (ref 6.0–8.5)

## 2015-09-08 LAB — CBC WITH DIFFERENTIAL/PLATELET
BASOS: 0 %
Basophils Absolute: 0 10*3/uL (ref 0.0–0.2)
EOS (ABSOLUTE): 0.2 10*3/uL (ref 0.0–0.4)
Eos: 3 %
HEMOGLOBIN: 13.6 g/dL (ref 11.1–15.9)
Hematocrit: 39.2 % (ref 34.0–46.6)
IMMATURE GRANS (ABS): 0 10*3/uL (ref 0.0–0.1)
Immature Granulocytes: 0 %
LYMPHS ABS: 1.6 10*3/uL (ref 0.7–3.1)
Lymphs: 31 %
MCH: 30.6 pg (ref 26.6–33.0)
MCHC: 34.7 g/dL (ref 31.5–35.7)
MCV: 88 fL (ref 79–97)
MONOS ABS: 0.5 10*3/uL (ref 0.1–0.9)
Monocytes: 10 %
NEUTROS PCT: 56 %
Neutrophils Absolute: 2.9 10*3/uL (ref 1.4–7.0)
Platelets: 241 10*3/uL (ref 150–379)
RBC: 4.45 x10E6/uL (ref 3.77–5.28)
RDW: 14.3 % (ref 12.3–15.4)
WBC: 5.2 10*3/uL (ref 3.4–10.8)

## 2015-09-08 LAB — NMR, LIPOPROFILE
Cholesterol: 169 mg/dL (ref 100–199)
HDL Cholesterol by NMR: 44 mg/dL (ref >39)
HDL Particle Number: 33.5 umol/L (ref >=30.5)
LDL PARTICLE NUMBER: 1413 nmol/L — AB (ref <1000)
LDL SIZE: 20.2 nm (ref >20.5)
LDL-C: 81 mg/dL (ref 0–99)
LP-IR SCORE: 76 — AB (ref <=45)
SMALL LDL PARTICLE NUMBER: 785 nmol/L — AB (ref <=527)
Triglycerides by NMR: 222 mg/dL — ABNORMAL HIGH (ref 0–149)

## 2015-09-08 LAB — BMP8+EGFR
BUN / CREAT RATIO: 20 (ref 11–26)
BUN: 13 mg/dL (ref 8–27)
CO2: 25 mmol/L (ref 18–29)
CREATININE: 0.65 mg/dL (ref 0.57–1.00)
Calcium: 9.8 mg/dL (ref 8.7–10.3)
Chloride: 98 mmol/L (ref 96–106)
GFR, EST AFRICAN AMERICAN: 98 mL/min/{1.73_m2} (ref >59)
GFR, EST NON AFRICAN AMERICAN: 85 mL/min/{1.73_m2} (ref >59)
Glucose: 106 mg/dL — ABNORMAL HIGH (ref 65–99)
Potassium: 4.2 mmol/L (ref 3.5–5.2)
Sodium: 138 mmol/L (ref 134–144)

## 2015-09-08 LAB — VITAMIN D 25 HYDROXY (VIT D DEFICIENCY, FRACTURES): Vit D, 25-Hydroxy: 47.6 ng/mL (ref 30.0–100.0)

## 2015-09-12 ENCOUNTER — Other Ambulatory Visit: Payer: Self-pay | Admitting: Nurse Practitioner

## 2015-09-14 ENCOUNTER — Encounter: Payer: Self-pay | Admitting: Family Medicine

## 2015-09-14 ENCOUNTER — Ambulatory Visit (INDEPENDENT_AMBULATORY_CARE_PROVIDER_SITE_OTHER): Payer: Medicare Other | Admitting: Family Medicine

## 2015-09-14 VITALS — BP 117/57 | HR 66 | Temp 97.3°F | Ht 60.0 in | Wt 148.0 lb

## 2015-09-14 DIAGNOSIS — E785 Hyperlipidemia, unspecified: Secondary | ICD-10-CM | POA: Diagnosis not present

## 2015-09-14 DIAGNOSIS — I779 Disorder of arteries and arterioles, unspecified: Secondary | ICD-10-CM

## 2015-09-14 DIAGNOSIS — E559 Vitamin D deficiency, unspecified: Secondary | ICD-10-CM

## 2015-09-14 DIAGNOSIS — I341 Nonrheumatic mitral (valve) prolapse: Secondary | ICD-10-CM

## 2015-09-14 DIAGNOSIS — I872 Venous insufficiency (chronic) (peripheral): Secondary | ICD-10-CM | POA: Diagnosis not present

## 2015-09-14 DIAGNOSIS — I10 Essential (primary) hypertension: Secondary | ICD-10-CM | POA: Diagnosis not present

## 2015-09-14 DIAGNOSIS — I351 Nonrheumatic aortic (valve) insufficiency: Secondary | ICD-10-CM | POA: Diagnosis not present

## 2015-09-14 DIAGNOSIS — K219 Gastro-esophageal reflux disease without esophagitis: Secondary | ICD-10-CM | POA: Diagnosis not present

## 2015-09-14 DIAGNOSIS — K449 Diaphragmatic hernia without obstruction or gangrene: Secondary | ICD-10-CM

## 2015-09-14 DIAGNOSIS — I739 Peripheral vascular disease, unspecified: Secondary | ICD-10-CM

## 2015-09-14 MED ORDER — AMLODIPINE-ATORVASTATIN 10-40 MG PO TABS: 1.0000 | ORAL_TABLET | Freq: Every day | ORAL | Status: DC

## 2015-09-14 NOTE — Patient Instructions (Addendum)
Medicare Annual Wellness Visit  [facility] and the medical providers at [facility] strive to bring you the best medical care.  In doing so we not only want to address your current medical conditions and concerns but also to detect new conditions early and prevent illness, disease and health-related problems.    Medicare offers a yearly Wellness Visit which allows our clinical staff to assess your need for preventative services including immunizations, lifestyle education, counseling to decrease risk of preventable diseases and screening for fall risk and other medical concerns.    This visit is provided free of charge (no copay) for all Medicare recipients. The clinical pharmacists at [facility] have begun to conduct these Wellness Visits which will also include a thorough review of all your medications.    As you primary medical provider recommend that you make an appointment for your Annual Wellness Visit if you have not done so already this year.  You may set up this appointment before you leave today or you may call back [phone]) and schedule an appointment.  Please make sure when you call that you mention that you are scheduling your Annual Wellness Visit with the clinical pharmacist so that the appointment may be made for the proper length of time.     Continue current medications. Continue good therapeutic lifestyle changes which include good diet and exercise. Fall precautions discussed with patient. If an FOBT was given today- please return it to our front desk. If you are over 47 years old - you may need Prevnar 35 or the adult Pneumonia vaccine.  **Flu shots are available--- please call and schedule a FLU-CLINIC appointment**  After your visit with [place] today you will receive a survey in the mail or online from [employer] regarding your care with [place]. Please take a moment to fill this out. Your feedback is very  important to [place] as you can help [place] better understand your patient needs as well as improve your experience and satisfaction. WE CARE ABOUT YOU!!!   Continue to wear support stockings and put these on the first thing with arising in the morning Watch sodium intake We will switch you to Caduet 10/40 A sure and discussed with the cardiologist your need for repeat carotid Doppler

## 2015-09-14 NOTE — Progress Notes (Signed)
Subjective:    Patient ID: Melissa Carpenter, female    DOB: Jun 16, 1934, 82 y.o.   MRN: [id number]  HPI Pt here for follow up and management of chronic medical problems which includes hypertension and hyperlipidemia. She is taking medications regularly. The patient comes to the visit today with no specific complaints. Patient lab work will be reviewed with her and she will be given a copy of the report. Cholesterol numbers with advanced lipid testing remain elevated though slightly improved. The LDL C was good at 81. The triglycerides are elevated at 222. The blood sugar slightly elevated at 106 and the creatinine remains within normal limits as well as electrolytes. All the liver function tests were normal and the vitamin D level was good at 47.6. CBC was also within normal limits. She is currently taking Caduet 10/20. He has a history of venous stasis in both lower extremities.      Patient Active Problem List   Diagnosis Date Noted  . Metabolic syndrome [id number]  . Gastroesophageal reflux disease with hiatal hernia 07/21/2013  . Osteoporosis 03/05/2013  . Tachycardia   . Edema   . Carotid artery disease ([place])   . Dyslipidemia   . Mitral valve prolapse   . Ejection fraction   . Aortic insufficiency   . Hypertension   . Chest pain    Outpatient Encounter Prescriptions as of 09/14/2015  Medication Sig  . amlodipine-atorvastatin (CADUET) 10-20 MG tablet TAKE 1 TABLET BY MOUTH DAILY.  Marland Kitchen aspirin 325 MG tablet Take 325 mg by mouth 2 (two) times daily.  Marland Kitchen BENICAR 40 MG tablet TAKE 1 TABLET BY MOUTH EVERY DAY  . Calcium Carbonate-Vitamin D (CALTRATE 600+D) 600-400 MG-UNIT per tablet Take 1 tablet by mouth daily.  . cholecalciferol (VITAMIN D) 400 UNITS TABS Take 1,000 Units by mouth.    . fish oil-omega-3 fatty acids 1000 MG capsule Take 1 g by mouth daily.    . metoprolol succinate (TOPROL-XL) 100 MG 24 hr tablet TAKE 1 TABLET (100 MG TOTAL) BY MOUTH DAILY. TAKE WITH OR IMMEDIATELY FOLLOWING  A MEAL.  . Multiple Vitamin (MULTIVITAMIN) tablet Take 1 tablet by mouth daily.    . pantoprazole (PROTONIX) 40 MG tablet TAKE 1 TABLET EVERY DAY  . [DISCONTINUED] cephALEXin (KEFLEX) 500 MG capsule Take 1 capsule (500 mg total) by mouth 2 (two) times daily. (Patient not taking: Reported on 05/28/2015)  . [DISCONTINUED] furosemide (LASIX) 20 MG tablet Take 1 tablet (20 mg total) by mouth daily. (Patient not taking: Reported on 05/28/2015)   No facility-administered encounter medications on file as of 09/14/2015.      Review of Systems  Constitutional: Negative.   HENT: Negative.   Eyes: Negative.   Respiratory: Negative.   Cardiovascular: Negative.   Gastrointestinal: Negative.   Endocrine: Negative.   Genitourinary: Negative.   Musculoskeletal: Negative.   Skin: Negative.   Allergic/Immunologic: Negative.   Neurological: Negative.   Hematological: Negative.   Psychiatric/Behavioral: Negative.        Objective:   Physical Exam  Constitutional: She is oriented to person, place, and time. She appears well-developed and well-nourished. No distress.  HENT:  Head: Normocephalic and atraumatic.  Right Ear: External ear normal.  Left Ear: External ear normal.  Nose: Nose normal.  Mouth/Throat: Oropharynx is clear and moist.  Eyes: Conjunctivae and EOM are normal. Pupils are equal, round, and reactive to light. Right eye exhibits no discharge. Left eye exhibits no discharge. No scleral icterus.  Neck: Normal range of motion.  Neck supple. No thyromegaly present.  Bilateral carotid bruits right greater than left  Cardiovascular: Normal rate, regular rhythm and intact distal pulses.  Exam reveals no gallop and no friction rub.   Murmur heard. Heart is regular with a grade 2/6 systolic ejection murmur at 60/m  Pulmonary/Chest: Effort normal and breath sounds normal. No respiratory distress. She has no wheezes. She has no rales. She exhibits no tenderness.  Abdominal: Soft. Bowel sounds  are normal. She exhibits no mass. There is no tenderness. There is no rebound and no guarding.  Musculoskeletal: Normal range of motion. She exhibits no edema or tenderness.  Lymphadenopathy:    She has no cervical adenopathy.  Neurological: She is alert and oriented to person, place, and time. She has normal reflexes. No cranial nerve deficit.  Skin: Skin is warm and dry. No rash noted.  Psychiatric: She has a normal mood and affect. Her behavior is normal. Judgment and thought content normal.  Nursing note and vitals reviewed.   BP 117/57 mmHg  Pulse 66  Temp(Src) 97.3 F (36.3 C) (Oral)  Ht 5' (1.524 m)  Wt 148 lb (67.132 kg)  BMI 28.90 kg/m2       Assessment & Plan:  1. Essential hypertension -The blood pressure is good today the patient will continue with current treatment  2. Hyperlipemia -Cholesterol numbers are elevated and we will change the Caduet from 10/20 to-10/40  3. Vitamin D deficiency -She will continue her current vitamin D replacement  4. Gastroesophageal reflux disease, esophagitis presence not specified -She is having no symptoms with reflux will continue with pantoprazole  5. Mitral valve prolapse -She will follow-up with cardiology as planned  6. Gastroesophageal reflux disease with hiatal hernia  7. Dyslipidemia -Increase the Lipitor component of her blood pressure pill from 20 to-40 mg  8. Carotid artery disease, unspecified laterality ([place]) -Repeat carotid Dopplers the spring  9. Aortic insufficiency - follow-up with cardiology  10. Chronic venous insufficiency -Continue to wear support hose  No orders of the defined types were placed in this encounter.   Patient Instructions                       Medicare Annual Wellness Visit  [facility] and the medical providers at [facility] strive to bring you the best medical care.  In doing so we not only want to address your current medical conditions and concerns but  also to detect new conditions early and prevent illness, disease and health-related problems.    Medicare offers a yearly Wellness Visit which allows our clinical staff to assess your need for preventative services including immunizations, lifestyle education, counseling to decrease risk of preventable diseases and screening for fall risk and other medical concerns.    This visit is provided free of charge (no copay) for all Medicare recipients. The clinical pharmacists at [facility] have begun to conduct these Wellness Visits which will also include a thorough review of all your medications.    As you primary medical provider recommend that you make an appointment for your Annual Wellness Visit if you have not done so already this year.  You may set up this appointment before you leave today or you may call back [phone] and schedule an appointment.  Please make sure when you call that you mention that you are scheduling your Annual Wellness Visit with the clinical pharmacist so that the appointment may be made for the proper length  of time.     Continue current medications. Continue good therapeutic lifestyle changes which include good diet and exercise. Fall precautions discussed with patient. If an FOBT was given today- please return it to our front desk. If you are over 75 years old - you may need Prevnar 2 or the adult Pneumonia vaccine.  **Flu shots are available--- please call and schedule a FLU-CLINIC appointment**  After your visit with [place] today you will receive a survey in the mail or online from [employer] regarding your care with [place]. Please take a moment to fill this out. Your feedback is very important to [place] as you can help [place] better understand your patient needs as well as improve your experience and satisfaction. WE CARE ABOUT YOU!!!   Continue to wear support stockings and put these on the first thing with arising in the morning Watch sodium intake We  will switch you to Caduet 10/40 A sure and discussed with the cardiologist your need for repeat carotid Doppler   Arrie Senate MD

## 2015-09-14 NOTE — Addendum Note (Signed)
Addended by: Zannie Cove on: 09/14/2015 10:36 AM   Modules accepted: Orders

## 2015-09-23 ENCOUNTER — Telehealth: Payer: Self-pay | Admitting: Family Medicine

## 2015-09-23 NOTE — Telephone Encounter (Signed)
Patient states that her BP today has been around 150's and 160's over 70's. Patient advised Laurance Flatten has no available appointments today and scheduled her tomorrow at 36 with Laurance Flatten.

## 2015-09-24 ENCOUNTER — Ambulatory Visit (INDEPENDENT_AMBULATORY_CARE_PROVIDER_SITE_OTHER): Payer: Medicare Other | Admitting: Family Medicine

## 2015-09-24 ENCOUNTER — Encounter: Payer: Self-pay | Admitting: Family Medicine

## 2015-09-24 VITALS — BP 118/59 | HR 68 | Temp 97.1°F | Ht 60.0 in | Wt 146.0 lb

## 2015-09-24 DIAGNOSIS — I10 Essential (primary) hypertension: Secondary | ICD-10-CM

## 2015-09-24 DIAGNOSIS — R002 Palpitations: Secondary | ICD-10-CM

## 2015-09-24 DIAGNOSIS — R42 Dizziness and giddiness: Secondary | ICD-10-CM

## 2015-09-24 LAB — POCT URINALYSIS DIPSTICK
BILIRUBIN UA: NEGATIVE
Blood, UA: NEGATIVE
GLUCOSE UA: NEGATIVE
KETONES UA: NEGATIVE
Nitrite, UA: NEGATIVE
Protein, UA: NEGATIVE
Urobilinogen, UA: NEGATIVE
pH, UA: 7

## 2015-09-24 LAB — POCT UA - MICROSCOPIC ONLY
BACTERIA, U MICROSCOPIC: NEGATIVE
CRYSTALS, UR, HPF, POC: NEGATIVE
Casts, Ur, LPF, POC: NEGATIVE
MUCUS UA: NEGATIVE
RBC, URINE, MICROSCOPIC: NEGATIVE
Yeast, UA: NEGATIVE

## 2015-09-24 NOTE — Patient Instructions (Signed)
We will call you with the results of the urinalysis once these results are available Take one half of the generic blood pressure pill in the morning and the other half in the evening Continue to monitor blood pressures at home Bring readings by next week after these changes were made Continue to watch sodium intake Watch caffeine intake as closely as possible Bring your monitor by when you come to the office with the readings next week for [place] to  compare the readings from your monitor with our monitor

## 2015-09-24 NOTE — Addendum Note (Signed)
Addended by: Zannie Cove on: 09/24/2015 02:03 PM   Modules accepted: Miquel Dunn

## 2015-09-24 NOTE — Progress Notes (Signed)
Subjective:    Patient ID: Melissa Carpenter, female    DOB: [birth year], 82 y.o.   MRN: [id number]  HPI Patient here today for episodes of elevated BP and dizziness. The patient today comes in because she's had episodes of elevated blood pressure the past few days with dizziness and increased heart rate and palpitations. She feels the pulses in her left ear. The readings that she brings in for review since February 9 have blood pressure readings in the 140-160 range and the diastolic in the [id number] range. One reading on February 10 had a blood pressure 130/54 with a pulse rate of 68. An EKG that was done today was within normal limits. She denies any chest pain aren't gastrointestinal symptoms or voiding symptoms.      Patient Active Problem List   Diagnosis Date Noted  . Chronic venous insufficiency 09/14/2015  . Metabolic syndrome [id number]  . Gastroesophageal reflux disease with hiatal hernia 07/21/2013  . Osteoporosis 03/05/2013  . Tachycardia   . Edema   . Carotid artery disease ([place])   . Dyslipidemia   . Mitral valve prolapse   . Ejection fraction   . Aortic insufficiency   . Hypertension   . Chest pain    Outpatient Encounter Prescriptions as of 09/24/2015  Medication Sig  . amLODipine-atorvastatin (CADUET) 10-40 MG tablet Take 1 tablet by mouth daily.  Marland Kitchen aspirin 325 MG tablet Take 325 mg by mouth 2 (two) times daily.  Marland Kitchen BENICAR 40 MG tablet TAKE 1 TABLET BY MOUTH EVERY DAY  . Calcium Carbonate-Vitamin D (CALTRATE 600+D) 600-400 MG-UNIT per tablet Take 1 tablet by mouth daily.  . cholecalciferol (VITAMIN D) 400 UNITS TABS Take 1,000 Units by mouth.    . fish oil-omega-3 fatty acids 1000 MG capsule Take 1 g by mouth daily.    . metoprolol succinate (TOPROL-XL) 100 MG 24 hr tablet TAKE 1 TABLET (100 MG TOTAL) BY MOUTH DAILY. TAKE WITH OR IMMEDIATELY FOLLOWING A MEAL.  . Multiple Vitamin (MULTIVITAMIN) tablet Take 1 tablet by mouth daily.    . pantoprazole (PROTONIX) 40 MG tablet  TAKE 1 TABLET EVERY DAY   No facility-administered encounter medications on file as of 09/24/2015.      Review of Systems  Constitutional: Negative.   HENT: Negative.   Eyes: Negative.   Respiratory: Negative.   Cardiovascular: Negative.   Gastrointestinal: Negative.   Endocrine: Negative.   Genitourinary: Negative.   Musculoskeletal: Negative.   Skin: Negative.   Allergic/Immunologic: Negative.   Neurological: Positive for dizziness (with elevated BP at times).  Hematological: Negative.   Psychiatric/Behavioral: Negative.        Objective:   Physical Exam  Constitutional: She is oriented to person, place, and time. She appears well-developed and well-nourished. No distress.  HENT:  Head: Normocephalic and atraumatic.  Eyes: Conjunctivae and EOM are normal. Pupils are equal, round, and reactive to light. Right eye exhibits no discharge. Left eye exhibits no discharge. No scleral icterus.  Neck: Normal range of motion.  No carotid bruits  Cardiovascular: Normal rate, regular rhythm and normal heart sounds.   No murmur heard. Pulmonary/Chest: Effort normal and breath sounds normal. No respiratory distress. She has no wheezes. She has no rales. She exhibits no tenderness.  Musculoskeletal: Normal range of motion. She exhibits no edema.  Neurological: She is alert and oriented to person, place, and time.  Skin: Skin is warm and dry. No rash noted.  Psychiatric: She has a normal mood and affect. Her behavior is  normal. Judgment and thought content normal.  Nursing note and vitals reviewed.  BP 118/59 mmHg  Pulse 68  Temp(Src) 97.1 F (36.2 C) (Oral)  Ht 5' (1.524 m)  Wt 146 lb (66.225 kg)  BMI 28.51 kg/m2        Assessment & Plan:  1. Palpitations -EKG was within normal limits and the patient will continue to limit her caffeine intake - EKG 12-Lead  2. Dizziness -No EKG findings to explain this and no carotid bruits - EKG 12-Lead  3. Essential  hypertension -Both blood pressure readings in this office were good with a second reading by me being 124/70 in the right arm sitting. -The patient seemed to be reassured about the normal EKG and the good blood pressure readings -She will bring readings from home by for review next week and bring her monitor in to check and compare with our monitor. She will split her generic Benicar in half and take one half in the morning and one half in the evening.  Patient Instructions  We will call you with the results of the urinalysis once these results are available Take one half of the generic blood pressure pill in the morning and the other half in the evening Continue to monitor blood pressures at home Bring readings by next week after these changes were made Continue to watch sodium intake Watch caffeine intake as closely as possible Bring your monitor by when you come to the office with the readings next week for [place] to  compare the readings from your monitor with our monitor   Arrie Senate MD

## 2015-09-24 NOTE — Addendum Note (Signed)
Addended by: Selmer Dominion on: 09/24/2015 02:08 PM   Modules accepted: Orders

## 2015-09-24 NOTE — Addendum Note (Signed)
Addended by: Earlene Plater on: 09/24/2015 01:01 PM   Modules accepted: Orders, SmartSet

## 2015-09-25 LAB — URINE CULTURE

## 2015-10-20 ENCOUNTER — Encounter: Payer: Self-pay | Admitting: Cardiology

## 2015-10-20 ENCOUNTER — Ambulatory Visit (INDEPENDENT_AMBULATORY_CARE_PROVIDER_SITE_OTHER): Payer: Medicare Other | Admitting: Cardiology

## 2015-10-20 VITALS — BP 140/60 | HR 68 | Ht 60.0 in | Wt 149.0 lb

## 2015-10-20 DIAGNOSIS — R002 Palpitations: Secondary | ICD-10-CM | POA: Diagnosis not present

## 2015-10-20 MED ORDER — METOPROLOL TARTRATE 25 MG PO TABS: 25.0000 mg | ORAL_TABLET | ORAL | Status: DC | PRN

## 2015-10-20 NOTE — Progress Notes (Signed)
Cardiology Office Note   Date:  10/20/2015   ID:  Melissa Carpenter, DOB 1933-11-02, MRN [id number]  PCP:  Redge Gainer, MD  Cardiologist:   Minus Breeding, MD   Chief Complaint  Patient presents with  . Palpitations      History of Present Illness: Melissa Carpenter is a 82 y.o. female who presents for follow up of palpitations and HTN.  She has seen Dr. Ron Parker in the past for these complaints.  She has had atypical chest pain as well.  She saw Dr. Laurance Flatten recently and she was having some increased blood pressures. Her systolics have been running in the 150s more frequently than usual. Typically she is in the 120s. She says she feels it in her chest when this is happening. She'll feel a little uncomfortable. She'll have some pounding in her years or had not a rapid rate. She's not had any presyncope or syncope. She can do her chores of daily living though limited by joint pains. With activity such as pushing a vacuum and carrying in groceries she denies any chest pressure, neck or arm discomfort. She doesn't report any shortness of breath, PND or orthopnea.  Past Medical History  Diagnosis Date  . Carotid artery disease ([place])     , Doppler, 2003  . Dyslipidemia   . Mitral valve prolapse     Echo, 2009, very mild intermittent prolapse of the posterior leaflet, no MR  . Ejection fraction     EF 60-70%, echo, December, 2009 / EF 55-60%, December, 2012  . Aortic insufficiency     Mild, echo, December, 2009  . Hypertension   . DJD (degenerative joint disease)   . Chest pain     Catheterization, 2003, no significant CAD  . Edema     November, 2012  . Diastolic dysfunction     Mild diastolic dysfunction, echo, December, 2012  . Tachycardia     Nighttime tachycardia, February, 2014  . Cataract   . GERD (gastroesophageal reflux disease)   . Hyperlipidemia   . Osteoporosis   . Thyroid nodule   . Cellulitis     Past Surgical History  Procedure Laterality Date  . Cardiac catheterization  sept  2003    no significant cad  . Appendectomy    . Tonsillectomy    . Eye surgery    . Biopsy thyroid Left   . Abdominal hysterectomy    . Nasal sinus surgery       Current Outpatient Prescriptions  Medication Sig Dispense Refill  . amLODipine-atorvastatin (CADUET) 10-40 MG tablet Take 1 tablet by mouth daily. 30 tablet 6  . aspirin 81 MG tablet Take 81 mg by mouth daily.    Marland Kitchen BENICAR 40 MG tablet TAKE 1 TABLET BY MOUTH EVERY DAY 90 tablet 1  . Calcium Carbonate-Vitamin D (CALTRATE 600+D) 600-400 MG-UNIT per tablet Take 1 tablet by mouth daily.    . cholecalciferol (VITAMIN D) 400 UNITS TABS Take 1,000 Units by mouth.      . fish oil-omega-3 fatty acids 1000 MG capsule Take 1 g by mouth daily.      . metoprolol succinate (TOPROL-XL) 100 MG 24 hr tablet TAKE 1 TABLET (100 MG TOTAL) BY MOUTH DAILY. TAKE WITH OR IMMEDIATELY FOLLOWING A MEAL. 90 tablet 0  . Multiple Vitamin (MULTIVITAMIN) tablet Take 1 tablet by mouth daily.      . pantoprazole (PROTONIX) 40 MG tablet TAKE 1 TABLET EVERY DAY 90 tablet 0  . metoprolol tartrate (LOPRESSOR)  25 MG tablet Take 1 tablet (25 mg total) by mouth as needed. 30 tablet 3   No current facility-administered medications for this visit.    Allergies:   Forteo; Codeine; Evista; Morphine; Penicillins; Sulfites; and Sulfonamide derivatives    ROS:  Please see the history of present illness.   Otherwise, review of systems are positive for joint pains..   All other systems are reviewed and negative.    PHYSICAL EXAM: VS:  BP 140/60 mmHg  Pulse 68  Ht 5' (1.524 m)  Wt 149 lb (67.586 kg)  BMI 29.10 kg/m2 , BMI Body mass index is 29.1 kg/(m^2). GENERAL:  Well appearing HEENT:  Pupils equal round and reactive, fundi not visualized, oral mucosa unremarkable NECK:  No jugular venous distention, waveform within normal limits, carotid upstroke brisk and symmetric, bilateral bruits, no thyromegaly LYMPHATICS:  No cervical, inguinal adenopathy LUNGS:  Clear to  auscultation bilaterally BACK:  No CVA tenderness CHEST:  Unremarkable HEART:  PMI not displaced or sustained,S1 and S2 within normal limits, no S3, no S4, no clicks, no rubs, 2 out of 6 apical systolic murmur radiating slightly out the aortic outflow tract, no diastolic murmurs ABD:  Flat, positive bowel sounds normal in frequency in pitch, no bruits, no rebound, no guarding, no midline pulsatile mass, no hepatomegaly, no splenomegaly EXT:  2 plus pulses throughout, no edema, no cyanosis no clubbing SKIN:  No rashes no nodules NEURO:  Cranial nerves II through XII grossly intact, motor grossly intact throughout PSYCH:  Cognitively intact, oriented to person place and time    EKG:  EKG is not ordered today. The ekg ordered 2/10 demonstrates sinus rhythm, rate 63, axis within normal limits, intervals within normal limits, no acute ST-T wave changes.   Recent Labs: 12/01/2014: Hemoglobin 13.5 12/08/2014: TSH 1.390 09/07/2015: ALT 26; BUN 13; Creatinine, Ser 0.65; Platelets 241; Potassium 4.2; Sodium 138    Lipid Panel    Component Value Date/Time   CHOL 169 09/07/2015 0819   CHOL 165 01/13/2013 0833   TRIG 222* 09/07/2015 0819   TRIG 240* 01/13/2013 0833   HDL 44 09/07/2015 0819   HDL 39* 01/13/2013 0833   LDLCALC 83 03/18/2014 0819   LDLCALC 78 01/13/2013 0833      Wt Readings from Last 3 Encounters:  10/20/15 149 lb (67.586 kg)  09/24/15 146 lb (66.225 kg)  09/14/15 148 lb (67.132 kg)      Other studies Reviewed: Additional studies/ records that were reviewed today include: Office records, echo and carotid . Review of the above records demonstrates:  Please see elsewhere in the note.     ASSESSMENT AND PLAN:  HTN:  She has periodic higher blood pressures. She also has some strong beats that she can feel. I'm going to give her when necessary metoprolol tartrate. If she's taking this frequently I will change her daily long-acting metoprolol dose.  PALPITATIONS:  As  above.  CAROTID STENOSIS:  She is due to have carotid Doppler in a few weeks. She had 40-59% right stenosis and less than 39% left stenosis/.  MURMUR:  I reviewed the echo report from previous. She had some mild aortic sclerosis and I suspect that this has not changed. No further imaging is indicated at this point.   Current medicines are reviewed at length with the patient today.  The patient does not have concerns regarding medicines.  The following changes have been made:  no change  Labs/ tests ordered today include:  No orders of  the defined types were placed in this encounter.     Disposition:   FU with me in 3 months.    Signed, Minus Breeding, MD  10/20/2015 10:06 AM    [facility]

## 2015-10-20 NOTE — Patient Instructions (Signed)
Medication Instructions:  The current medical regimen is effective;  continue present plan and medications. You may take Metoprolol tartrate 25 mg as needed for high blood pressure and/or palpitations.  Follow-Up: Follow up in 3 months with Dr Percival Spanish in [place].  If you need a refill on your cardiac medications before your next appointment, please call your pharmacy.  Thank you for choosing [facility]!!

## 2015-10-28 ENCOUNTER — Other Ambulatory Visit: Payer: Self-pay | Admitting: Cardiology

## 2015-10-28 DIAGNOSIS — I6523 Occlusion and stenosis of bilateral carotid arteries: Secondary | ICD-10-CM

## 2015-11-03 DIAGNOSIS — M7061 Trochanteric bursitis, right hip: Secondary | ICD-10-CM | POA: Diagnosis not present

## 2015-11-03 DIAGNOSIS — M25552 Pain in left hip: Secondary | ICD-10-CM | POA: Diagnosis not present

## 2015-11-03 DIAGNOSIS — M25551 Pain in right hip: Secondary | ICD-10-CM | POA: Diagnosis not present

## 2015-11-03 DIAGNOSIS — M7062 Trochanteric bursitis, left hip: Secondary | ICD-10-CM | POA: Diagnosis not present

## 2015-11-06 ENCOUNTER — Other Ambulatory Visit: Payer: Self-pay | Admitting: Family Medicine

## 2015-11-11 ENCOUNTER — Ambulatory Visit (HOSPITAL_COMMUNITY)
Admission: RE | Admit: 2015-11-11 | Discharge: 2015-11-11 | Disposition: A | Discharge status: Home / Self Care | Payer: Medicare Other | Source: Ambulatory Visit | Attending: Cardiology | Admitting: Cardiology

## 2015-11-11 DIAGNOSIS — I6523 Occlusion and stenosis of bilateral carotid arteries: Secondary | ICD-10-CM | POA: Diagnosis not present

## 2015-11-11 DIAGNOSIS — K219 Gastro-esophageal reflux disease without esophagitis: Secondary | ICD-10-CM | POA: Diagnosis not present

## 2015-11-11 DIAGNOSIS — E785 Hyperlipidemia, unspecified: Secondary | ICD-10-CM | POA: Insufficient documentation

## 2015-11-11 DIAGNOSIS — I10 Essential (primary) hypertension: Secondary | ICD-10-CM | POA: Insufficient documentation

## 2015-11-18 ENCOUNTER — Telehealth: Payer: Self-pay

## 2015-11-18 NOTE — Telephone Encounter (Signed)
Pt is to take 100 mg of Metoprolol succinate daily and 25 mg of tartrate prn HTN/palps.   This is the second time I have clarified this information and the pharmacist said she will make sure the instructions as documented

## 2015-11-18 NOTE — Telephone Encounter (Signed)
Called regarding question about Metoprolol sent in last OV. Stated pt gets Metoprolol from another doctor also. Would like call back from nurse.

## 2015-12-11 ENCOUNTER — Other Ambulatory Visit: Payer: Self-pay | Admitting: Family Medicine

## 2015-12-12 ENCOUNTER — Other Ambulatory Visit: Payer: Self-pay | Admitting: Family Medicine

## 2015-12-30 ENCOUNTER — Ambulatory Visit (INDEPENDENT_AMBULATORY_CARE_PROVIDER_SITE_OTHER): Payer: Medicare Other | Admitting: Family Medicine

## 2015-12-30 ENCOUNTER — Ambulatory Visit (INDEPENDENT_AMBULATORY_CARE_PROVIDER_SITE_OTHER): Payer: Medicare Other

## 2015-12-30 ENCOUNTER — Encounter: Payer: Self-pay | Admitting: Family Medicine

## 2015-12-30 VITALS — BP 118/57 | HR 70 | Temp 97.1°F | Ht 60.0 in | Wt 148.0 lb

## 2015-12-30 DIAGNOSIS — I351 Nonrheumatic aortic (valve) insufficiency: Secondary | ICD-10-CM

## 2015-12-30 DIAGNOSIS — J40 Bronchitis, not specified as acute or chronic: Secondary | ICD-10-CM | POA: Diagnosis not present

## 2015-12-30 DIAGNOSIS — I779 Disorder of arteries and arterioles, unspecified: Secondary | ICD-10-CM | POA: Diagnosis not present

## 2015-12-30 DIAGNOSIS — I6523 Occlusion and stenosis of bilateral carotid arteries: Secondary | ICD-10-CM

## 2015-12-30 DIAGNOSIS — J209 Acute bronchitis, unspecified: Secondary | ICD-10-CM

## 2015-12-30 DIAGNOSIS — R05 Cough: Secondary | ICD-10-CM | POA: Diagnosis not present

## 2015-12-30 DIAGNOSIS — I739 Peripheral vascular disease, unspecified: Secondary | ICD-10-CM

## 2015-12-30 DIAGNOSIS — R059 Cough, unspecified: Secondary | ICD-10-CM

## 2015-12-30 DIAGNOSIS — J301 Allergic rhinitis due to pollen: Secondary | ICD-10-CM

## 2015-12-30 LAB — CBC WITH DIFFERENTIAL/PLATELET
BASOS: 0 %
Basophils Absolute: 0 10*3/uL (ref 0.0–0.2)
EOS (ABSOLUTE): 0.1 10*3/uL (ref 0.0–0.4)
EOS: 3 %
HEMATOCRIT: 35.8 % (ref 34.0–46.6)
Hemoglobin: 12 g/dL (ref 11.1–15.9)
Immature Grans (Abs): 0 10*3/uL (ref 0.0–0.1)
Immature Granulocytes: 0 %
Lymphocytes Absolute: 1.5 10*3/uL (ref 0.7–3.1)
Lymphs: 31 %
MCH: 30.8 pg (ref 26.6–33.0)
MCHC: 33.5 g/dL (ref 31.5–35.7)
MCV: 92 fL (ref 79–97)
MONOS ABS: 0.5 10*3/uL (ref 0.1–0.9)
Monocytes: 10 %
Neutrophils Absolute: 2.7 10*3/uL (ref 1.4–7.0)
Neutrophils: 56 %
PLATELETS: 218 10*3/uL (ref 150–379)
RBC: 3.89 x10E6/uL (ref 3.77–5.28)
RDW: 14.4 % (ref 12.3–15.4)
WBC: 4.8 10*3/uL (ref 3.4–10.8)

## 2015-12-30 IMAGING — DX DG CHEST 2V
2 series · 2 of 2 positions shown · non-contrast
Comparison: Chest x-ray of [DATE]

CLINICAL DATA: Cough

EXAM:
CHEST  2 VIEW

[chest pa]
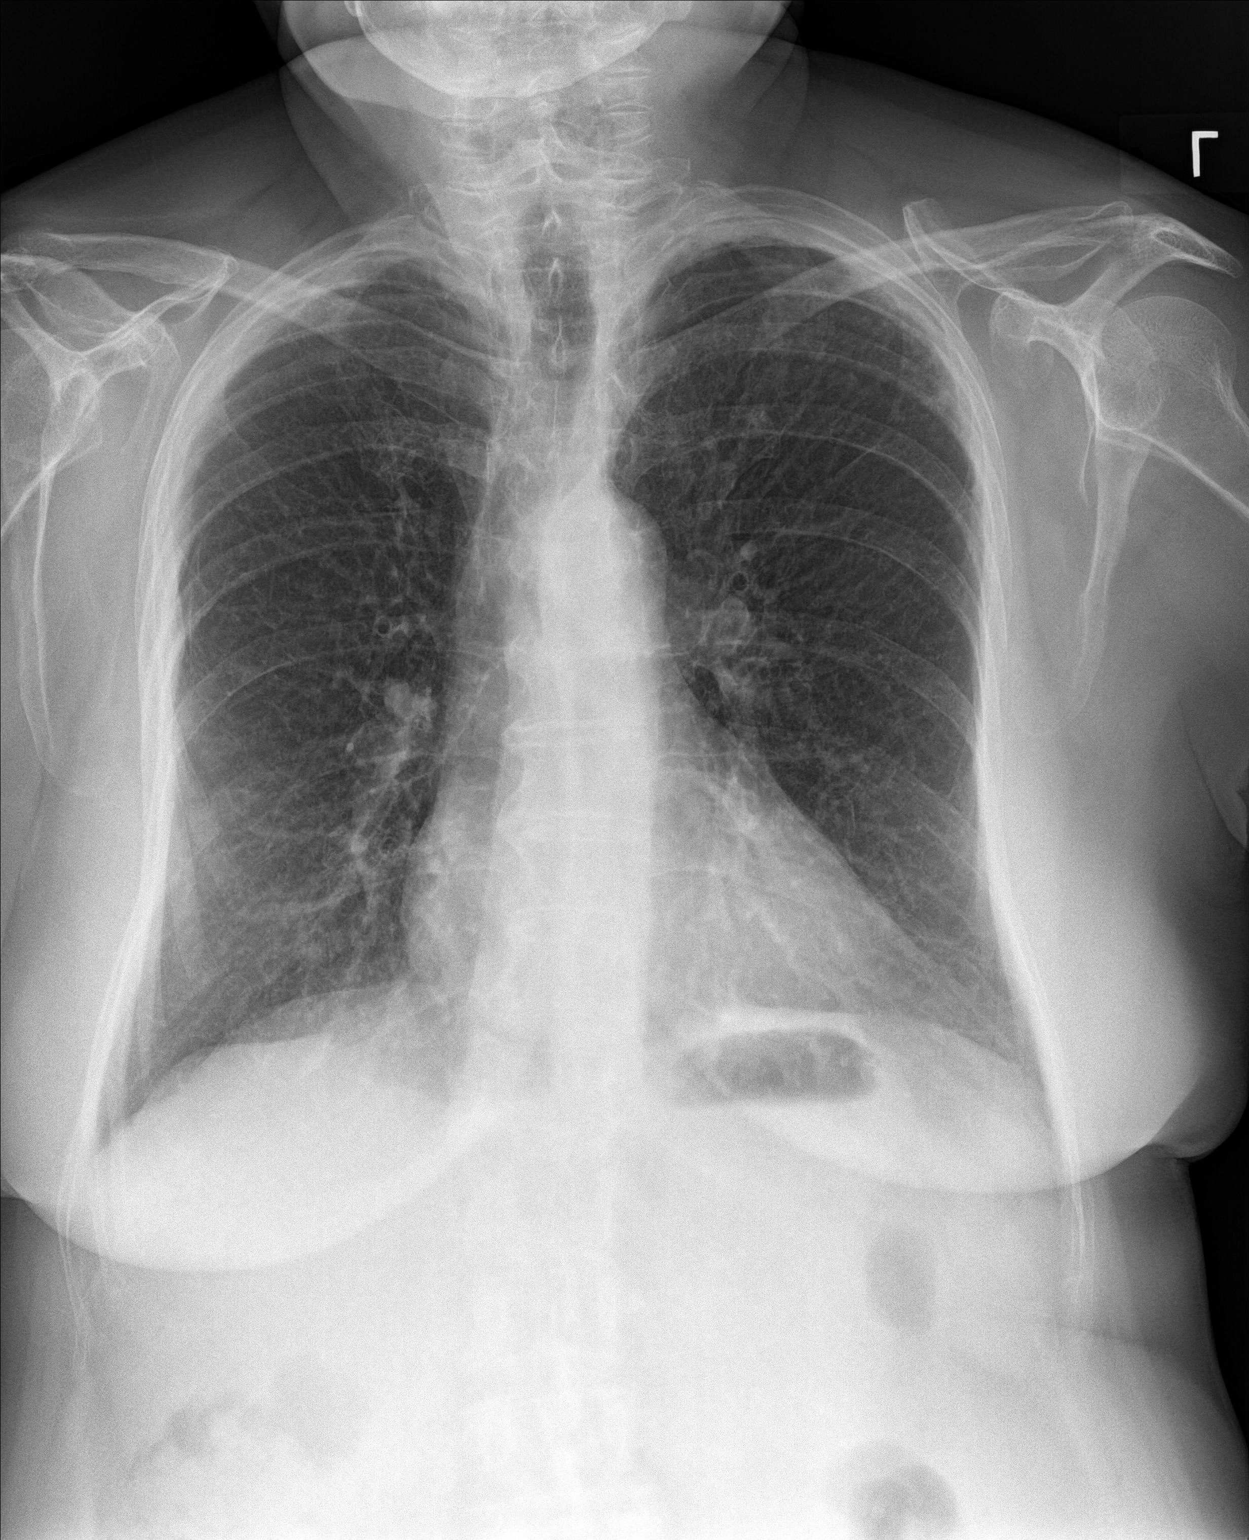

[chest lat]
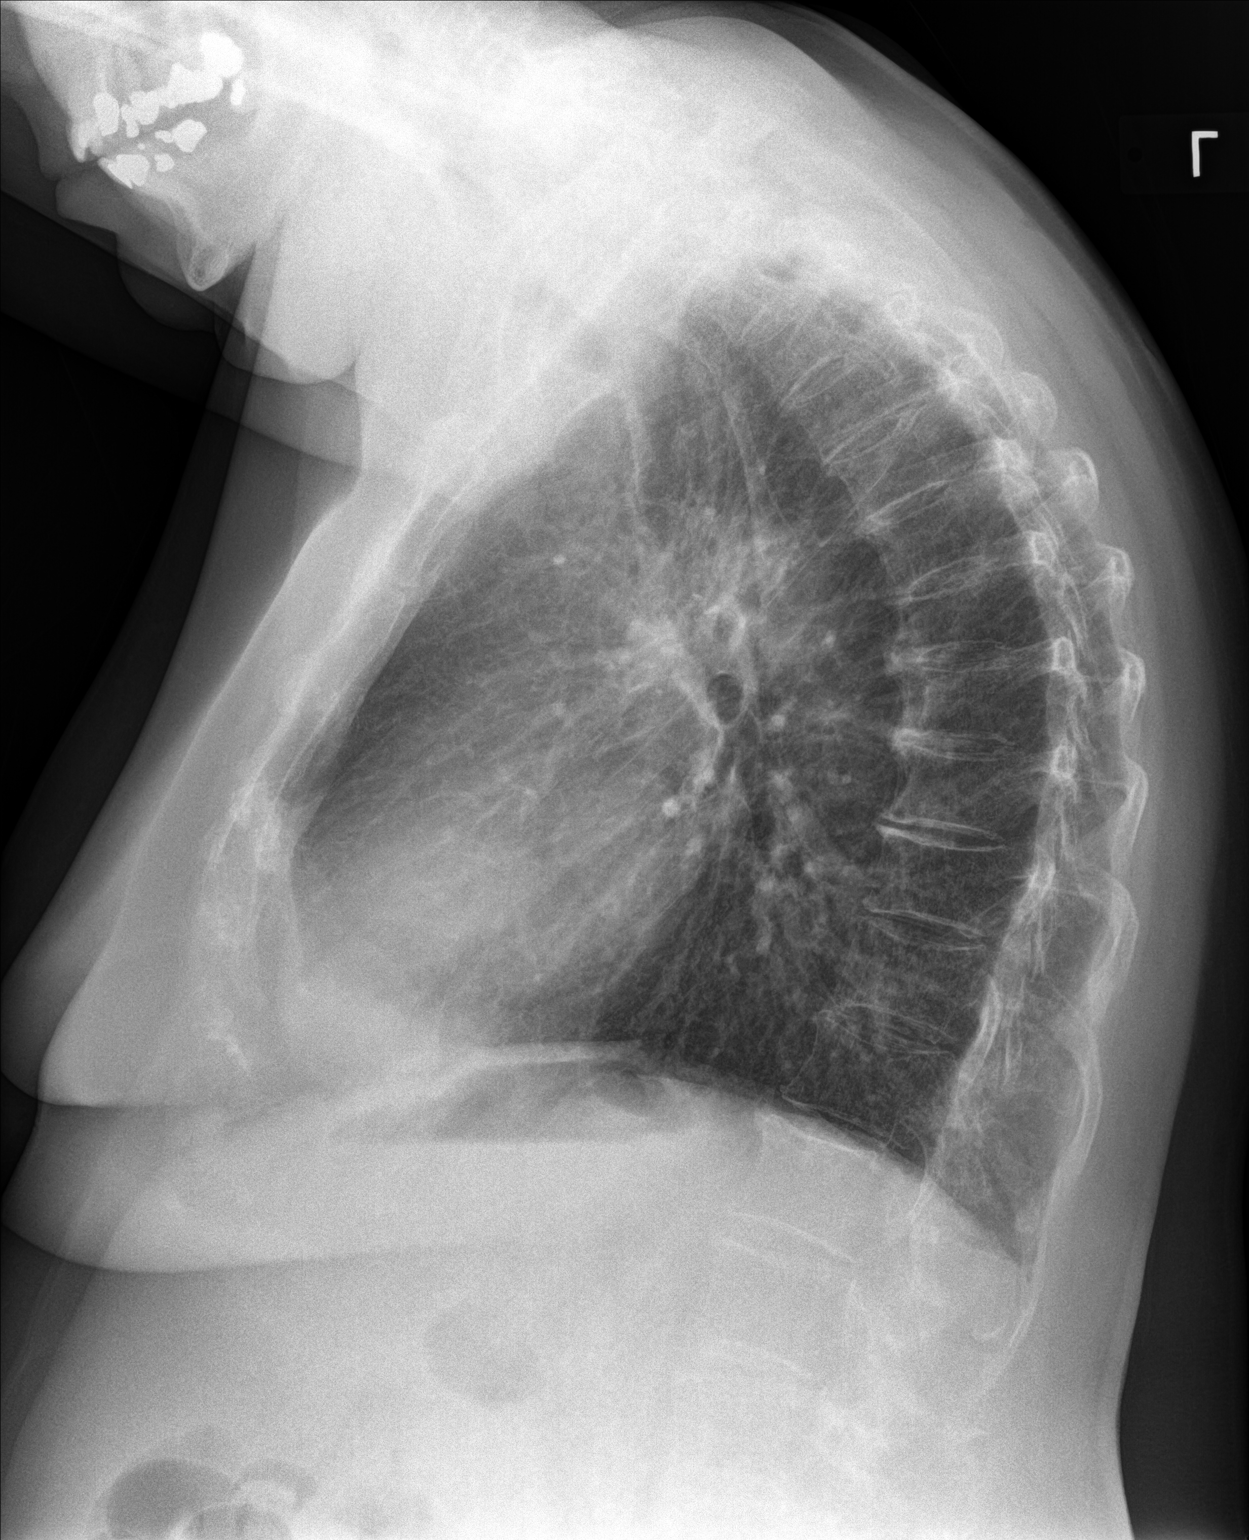

[2 of 2 positions shown; findings below may reference images not displayed]

FINDINGS: No pneumonia or effusion is seen. There are prominent perihilar
markings present with peribronchial thickening most consistent with
bronchitis. Mediastinal and hilar contours are unchanged and the
heart is within upper limits normal. There are degenerative changes
throughout the thoracic spine.
IMPRESSION: No pneumonia or effusion.  Probable bronchitis, possibly chronic.

## 2015-12-30 MED ORDER — FLUTICASONE PROPIONATE 50 MCG/ACT NA SUSP: 2.0000 | Freq: Every day | NASAL | Status: DC

## 2015-12-30 MED ORDER — PREDNISONE 10 MG PO TABS
ORAL_TABLET | ORAL | Status: DC
Start: 2015-12-30 — End: 2016-01-26

## 2015-12-30 MED ORDER — AZITHROMYCIN 250 MG PO TABS: ORAL_TABLET | ORAL | Status: DC

## 2015-12-30 NOTE — Patient Instructions (Signed)
Continue to take Mucinex twice daily with a large glass of water Take prednisone as directed and antibiotic as directed Avoid irritating environments Drink plenty of fluids and stay well hydrated Use Flonase nasal spray at bedtime and nasal saline during the day

## 2015-12-30 NOTE — Progress Notes (Signed)
Subjective:    Patient ID: Melissa Carpenter, female    DOB: 01/20/1934, 82 y.o.   MRN: [id number]  HPI Patient here today for cough, congestion and wheezing that started about 3 weeks ago. The patient has not had any fever. The cough is actually improving. The sputum has been hand or beige in color. Her husband has also had some the same symptoms. Patient had questions from me concerning her previous chest x-ray and her visit to the cardiologist.The patient has no gastrointestinal symptoms other than off and on diarrhea and constipation. She does go frequently to pass her water but no burning or pain with voiding.    Patient Active Problem List   Diagnosis Date Noted  . Chronic venous insufficiency 09/14/2015  . Metabolic syndrome [id number]  . Gastroesophageal reflux disease with hiatal hernia 07/21/2013  . Osteoporosis 03/05/2013  . Tachycardia   . Edema   . Carotid artery disease ([place])   . Dyslipidemia   . Mitral valve prolapse   . Ejection fraction   . Aortic insufficiency   . Hypertension   . Chest pain    Outpatient Encounter Prescriptions as of 12/30/2015  Medication Sig  . amLODipine-atorvastatin (CADUET) 10-40 MG tablet Take 1 tablet by mouth daily.  Marland Kitchen aspirin 81 MG tablet Take 81 mg by mouth daily.  . Calcium Carbonate-Vitamin D (CALTRATE 600+D) 600-400 MG-UNIT per tablet Take 1 tablet by mouth daily.  . cholecalciferol (VITAMIN D) 400 UNITS TABS Take 1,000 Units by mouth.    . fish oil-omega-3 fatty acids 1000 MG capsule Take 1 g by mouth daily.    . metoprolol succinate (TOPROL-XL) 100 MG 24 hr tablet TAKE 1 TABLET (100 MG TOTAL) BY MOUTH DAILY. TAKE WITH OR IMMEDIATELY FOLLOWING A MEAL.  . Multiple Vitamin (MULTIVITAMIN) tablet Take 1 tablet by mouth daily.    Marland Kitchen olmesartan (BENICAR) 40 MG tablet TAKE 1 TABLET BY MOUTH EVERY DAY  . pantoprazole (PROTONIX) 40 MG tablet TAKE 1 TABLET EVERY DAY  . metoprolol tartrate (LOPRESSOR) 25 MG tablet Take 1 tablet (25 mg total) by  mouth as needed. (Patient not taking: Reported on 12/30/2015)   No facility-administered encounter medications on file as of 12/30/2015.      Review of Systems  Constitutional: Negative.  Negative for fever.  HENT: Positive for congestion.   Eyes: Negative.   Respiratory: Positive for cough, shortness of breath and wheezing.   Cardiovascular: Negative.   Gastrointestinal: Negative.   Endocrine: Negative.   Genitourinary: Negative.   Musculoskeletal: Negative.   Skin: Negative.   Allergic/Immunologic: Negative.   Neurological: Negative.   Hematological: Negative.   Psychiatric/Behavioral: Negative.        Objective:   Physical Exam  Constitutional: She is oriented to person, place, and time. She appears well-developed and well-nourished. No distress.  HENT:  Head: Normocephalic and atraumatic.  Right Ear: External ear normal.  Mouth/Throat: Oropharynx is clear and moist. No oropharyngeal exudate.  Some nasal congestion bilaterally and the left TM appears normal and the right but with no redness.  Eyes: Conjunctivae and EOM are normal. Pupils are equal, round, and reactive to light. Right eye exhibits no discharge. Left eye exhibits no discharge. No scleral icterus.  Neck: Normal range of motion. Neck supple. No thyromegaly present.  No anterior cervical adenopathy  Cardiovascular: Normal rate, regular rhythm and intact distal pulses.  Exam reveals no friction rub.   Murmur heard. Grade 3/6 systolic ejection murmur  Pulmonary/Chest: Effort normal and breath sounds normal.  No respiratory distress. She has no wheezes. She has no rales. She exhibits no tenderness.  A few rhonchi with coughing otherwise no wheezes or rales  Musculoskeletal: Normal range of motion. She exhibits no edema.  Lymphadenopathy:    She has no cervical adenopathy.  Neurological: She is alert and oriented to person, place, and time.  Skin: Skin is warm and dry. No rash noted.  Psychiatric: She has a normal  mood and affect. Her behavior is normal. Judgment and thought content normal.  Nursing note and vitals reviewed.  BP 118/57 mmHg  Pulse 70  Temp(Src) 97.1 F (36.2 C) (Oral)  Ht 5' (1.524 m)  Wt 148 lb (67.132 kg)  BMI 28.90 kg/m2        Assessment & Plan:  1. Cough - CBC with Differential/Platelet - DG Chest 2 View; Future - predniSONE (DELTASONE) 10 MG tablet; 1 tablet 4 times a day for 2 days,  1 tablet 3 times a day for 2 days,  1 tablet 2 times a day for 2 days, 1 tablet daily for 2 days  Dispense: 20 tablet; Refill: 0  2. Bronchitis with bronchospasm -Drink plenty of fluids and take medicine as directed - predniSONE (DELTASONE) 10 MG tablet; 1 tablet 4 times a day for 2 days,  1 tablet 3 times a day for 2 days,  1 tablet 2 times a day for 2 days, 1 tablet daily for 2 days  Dispense: 20 tablet; Refill: 0 - azithromycin (ZITHROMAX) 250 MG tablet; 2 pills the first day then one daily for infection until completed  Dispense: 6 tablet; Refill: 0  3. Aortic insufficiency -Continue to follow-up with cardiology  4. Carotid artery disease, unspecified laterality ([place]) -Continue to follow-up with cardiology  5. Allergic rhinitis -Use Flonase as directed  Meds ordered this encounter  Medications  . predniSONE (DELTASONE) 10 MG tablet    Sig: 1 tablet 4 times a day for 2 days,  1 tablet 3 times a day for 2 days,  1 tablet 2 times a day for 2 days, 1 tablet daily for 2 days    Dispense:  20 tablet    Refill:  0  . azithromycin (ZITHROMAX) 250 MG tablet    Sig: 2 pills the first day then one daily for infection until completed    Dispense:  6 tablet    Refill:  0  . fluticasone (FLONASE) 50 MCG/ACT nasal spray    Sig: Place 2 sprays into both nostrils daily.    Dispense:  16 g    Refill:  6   Patient Instructions  Continue to take Mucinex twice daily with a large glass of water Take prednisone as directed and antibiotic as directed Avoid irritating  environments Drink plenty of fluids and stay well hydrated Use Flonase nasal spray at bedtime and nasal saline during the day    Arrie Senate MD

## 2016-01-04 DIAGNOSIS — D3131 Benign neoplasm of right choroid: Secondary | ICD-10-CM | POA: Diagnosis not present

## 2016-01-04 DIAGNOSIS — H04123 Dry eye syndrome of bilateral lacrimal glands: Secondary | ICD-10-CM | POA: Diagnosis not present

## 2016-01-04 DIAGNOSIS — H10413 Chronic giant papillary conjunctivitis, bilateral: Secondary | ICD-10-CM | POA: Diagnosis not present

## 2016-01-04 DIAGNOSIS — E119 Type 2 diabetes mellitus without complications: Secondary | ICD-10-CM | POA: Diagnosis not present

## 2016-01-04 DIAGNOSIS — Z961 Presence of intraocular lens: Secondary | ICD-10-CM | POA: Diagnosis not present

## 2016-01-04 LAB — HM DIABETES EYE EXAM

## 2016-01-18 ENCOUNTER — Other Ambulatory Visit: Payer: Medicare Other

## 2016-01-18 DIAGNOSIS — E785 Hyperlipidemia, unspecified: Secondary | ICD-10-CM

## 2016-01-18 DIAGNOSIS — K449 Diaphragmatic hernia without obstruction or gangrene: Secondary | ICD-10-CM

## 2016-01-18 DIAGNOSIS — K219 Gastro-esophageal reflux disease without esophagitis: Secondary | ICD-10-CM | POA: Diagnosis not present

## 2016-01-18 DIAGNOSIS — I10 Essential (primary) hypertension: Secondary | ICD-10-CM | POA: Diagnosis not present

## 2016-01-18 DIAGNOSIS — E559 Vitamin D deficiency, unspecified: Secondary | ICD-10-CM

## 2016-01-18 DIAGNOSIS — R7309 Other abnormal glucose: Secondary | ICD-10-CM | POA: Diagnosis not present

## 2016-01-19 LAB — HEPATIC FUNCTION PANEL
ALK PHOS: 79 IU/L (ref 39–117)
ALT: 28 IU/L (ref 0–32)
AST: 25 IU/L (ref 0–40)
Albumin: 4.6 g/dL (ref 3.5–4.8)
BILIRUBIN, DIRECT: 0.12 mg/dL (ref 0.00–0.40)
Bilirubin Total: 0.4 mg/dL (ref 0.0–1.2)
TOTAL PROTEIN: 7 g/dL (ref 6.0–8.5)

## 2016-01-19 LAB — NMR, LIPOPROFILE
Cholesterol: 157 mg/dL (ref 100–199)
HDL Cholesterol by NMR: 40 mg/dL (ref >39)
HDL Particle Number: 32.8 umol/L (ref >=30.5)
LDL PARTICLE NUMBER: 1288 nmol/L — AB (ref <1000)
LDL SIZE: 19.8 nm (ref >20.5)
LDL-C: 65 mg/dL (ref 0–99)
LP-IR SCORE: 85 — AB (ref <=45)
Small LDL Particle Number: 808 nmol/L — ABNORMAL HIGH (ref <=527)
TRIGLYCERIDES BY NMR: 259 mg/dL — AB (ref 0–149)

## 2016-01-19 LAB — CBC WITH DIFFERENTIAL/PLATELET
BASOS ABS: 0 10*3/uL (ref 0.0–0.2)
BASOS: 1 %
EOS (ABSOLUTE): 0.2 10*3/uL (ref 0.0–0.4)
Eos: 4 %
Hematocrit: 37.5 % (ref 34.0–46.6)
Hemoglobin: 12.9 g/dL (ref 11.1–15.9)
IMMATURE GRANS (ABS): 0 10*3/uL (ref 0.0–0.1)
Immature Granulocytes: 0 %
LYMPHS ABS: 1.6 10*3/uL (ref 0.7–3.1)
LYMPHS: 31 %
MCH: 30.6 pg (ref 26.6–33.0)
MCHC: 34.4 g/dL (ref 31.5–35.7)
MCV: 89 fL (ref 79–97)
Monocytes Absolute: 0.5 10*3/uL (ref 0.1–0.9)
Monocytes: 10 %
NEUTROS ABS: 2.9 10*3/uL (ref 1.4–7.0)
Neutrophils: 54 %
PLATELETS: 222 10*3/uL (ref 150–379)
RBC: 4.21 x10E6/uL (ref 3.77–5.28)
RDW: 14.4 % (ref 12.3–15.4)
WBC: 5.2 10*3/uL (ref 3.4–10.8)

## 2016-01-19 LAB — BMP8+EGFR
BUN / CREAT RATIO: 27 (ref 12–28)
BUN: 14 mg/dL (ref 8–27)
CHLORIDE: 98 mmol/L (ref 96–106)
CO2: 21 mmol/L (ref 18–29)
Calcium: 9.6 mg/dL (ref 8.7–10.3)
Creatinine, Ser: 0.51 mg/dL — ABNORMAL LOW (ref 0.57–1.00)
GFR, EST AFRICAN AMERICAN: 106 mL/min/{1.73_m2} (ref >59)
GFR, EST NON AFRICAN AMERICAN: 92 mL/min/{1.73_m2} (ref >59)
Glucose: 123 mg/dL — ABNORMAL HIGH (ref 65–99)
POTASSIUM: 4.2 mmol/L (ref 3.5–5.2)
SODIUM: 138 mmol/L (ref 134–144)

## 2016-01-19 LAB — VITAMIN D 25 HYDROXY (VIT D DEFICIENCY, FRACTURES): Vit D, 25-Hydroxy: 47.9 ng/mL (ref 30.0–100.0)

## 2016-01-21 LAB — SPECIMEN STATUS REPORT

## 2016-01-21 LAB — HGB A1C W/O EAG: HEMOGLOBIN A1C: 6.4 % — AB (ref 4.8–5.6)

## 2016-01-26 ENCOUNTER — Telehealth: Payer: Self-pay | Admitting: Family Medicine

## 2016-01-26 ENCOUNTER — Ambulatory Visit (INDEPENDENT_AMBULATORY_CARE_PROVIDER_SITE_OTHER): Payer: Medicare Other | Admitting: Family Medicine

## 2016-01-26 ENCOUNTER — Encounter: Payer: Self-pay | Admitting: Family Medicine

## 2016-01-26 VITALS — BP 121/59 | HR 65 | Temp 97.5°F | Ht 60.0 in | Wt 149.0 lb

## 2016-01-26 DIAGNOSIS — I779 Disorder of arteries and arterioles, unspecified: Secondary | ICD-10-CM | POA: Diagnosis not present

## 2016-01-26 DIAGNOSIS — I739 Peripheral vascular disease, unspecified: Secondary | ICD-10-CM

## 2016-01-26 DIAGNOSIS — I351 Nonrheumatic aortic (valve) insufficiency: Secondary | ICD-10-CM | POA: Diagnosis not present

## 2016-01-26 DIAGNOSIS — I341 Nonrheumatic mitral (valve) prolapse: Secondary | ICD-10-CM

## 2016-01-26 DIAGNOSIS — K219 Gastro-esophageal reflux disease without esophagitis: Secondary | ICD-10-CM

## 2016-01-26 DIAGNOSIS — I6523 Occlusion and stenosis of bilateral carotid arteries: Secondary | ICD-10-CM | POA: Diagnosis not present

## 2016-01-26 DIAGNOSIS — M160 Bilateral primary osteoarthritis of hip: Secondary | ICD-10-CM

## 2016-01-26 DIAGNOSIS — E559 Vitamin D deficiency, unspecified: Secondary | ICD-10-CM | POA: Diagnosis not present

## 2016-01-26 DIAGNOSIS — R7309 Other abnormal glucose: Secondary | ICD-10-CM

## 2016-01-26 DIAGNOSIS — I10 Essential (primary) hypertension: Secondary | ICD-10-CM | POA: Diagnosis not present

## 2016-01-26 DIAGNOSIS — R739 Hyperglycemia, unspecified: Secondary | ICD-10-CM

## 2016-01-26 DIAGNOSIS — E785 Hyperlipidemia, unspecified: Secondary | ICD-10-CM | POA: Diagnosis not present

## 2016-01-26 MED ORDER — BLOOD GLUCOSE MONITOR KIT: PACK | Status: DC

## 2016-01-26 NOTE — Progress Notes (Signed)
Subjective:    Patient ID: Melissa Carpenter, female    DOB: 1933-12-14, 82 y.o.   MRN: [id number]  HPI Pt here for follow up and management of chronic medical problems which includes hypertension and hyperlipidemia. She is taking medications regularly.The patient is doing well today with no specific complaints. She has an upcoming appointment next Wednesday with the cardiologist. She was previously followed by Dr. Ron Parker who is retired. Her lab work will be reviewed with her today. All liver function tests were normal. The blood sugar was elevated at 123 with normal kidney function and electrolytes. The patient did have a hemoglobin A1c that was 6.4% and she was to start taking metformin 500 mg 1 daily. CBC had a normal white blood cell count with a good hemoglobin and normal platelet count. Cholesterol numbers with advanced lipid testing have a total LDL particle number that was elevated but improved from 4 months ago and is now 1288 with an LDL C is 65. Triglycerides remain elevated at 259. Better blood sugar control hopefully will help bring the triglycerides down more. Vitamin D level was good at 47.9 and as mentioned earlier her A1c was 6.4%. The patient is pleasant and alert today. She complains of arthritic problems in her hips. We discussed starting metformin and she would like to try watching her diet more closely for the next 4 weeks. We will allow her to do that. She will bring in blood sugars to see the clinical pharmacist and then we will make a decision at that point in time whether to start her on metformin or not. She denies any chest pain but does have occasional palpitations. She denies any shortness of breath. She has no trouble with swallowing. She does have occasional heartburn because of a hiatal hernia with reflux. This is no more than usual. She denies blood in the stool black tarry bowel movements or abdominal pain nausea vomiting or diarrhea. She is passing her water without  problems.     Patient Active Problem List   Diagnosis Date Noted  . Chronic venous insufficiency 09/14/2015  . Metabolic syndrome [id number]  . Gastroesophageal reflux disease with hiatal hernia 07/21/2013  . Osteoporosis 03/05/2013  . Tachycardia   . Edema   . Carotid artery disease ([place])   . Dyslipidemia   . Mitral valve prolapse   . Ejection fraction   . Aortic insufficiency   . Hypertension   . Chest pain    Outpatient Encounter Prescriptions as of 01/26/2016  Medication Sig  . amLODipine-atorvastatin (CADUET) 10-40 MG tablet Take 1 tablet by mouth daily.  Marland Kitchen aspirin 81 MG tablet Take 81 mg by mouth daily.  . Calcium Carbonate-Vitamin D (CALTRATE 600+D) 600-400 MG-UNIT per tablet Take 1 tablet by mouth daily.  . cholecalciferol (VITAMIN D) 400 UNITS TABS Take 1,000 Units by mouth.    . fish oil-omega-3 fatty acids 1000 MG capsule Take 1 g by mouth daily.    . metoprolol succinate (TOPROL-XL) 100 MG 24 hr tablet TAKE 1 TABLET (100 MG TOTAL) BY MOUTH DAILY. TAKE WITH OR IMMEDIATELY FOLLOWING A MEAL.  . metoprolol tartrate (LOPRESSOR) 25 MG tablet Take 1 tablet (25 mg total) by mouth as needed.  . Multiple Vitamin (MULTIVITAMIN) tablet Take 1 tablet by mouth daily.    Marland Kitchen olmesartan (BENICAR) 40 MG tablet TAKE 1 TABLET BY MOUTH EVERY DAY  . pantoprazole (PROTONIX) 40 MG tablet TAKE 1 TABLET EVERY DAY  . [DISCONTINUED] azithromycin (ZITHROMAX) 250 MG tablet 2 pills the  first day then one daily for infection until completed  . [DISCONTINUED] fluticasone (FLONASE) 50 MCG/ACT nasal spray Place 2 sprays into both nostrils daily.  . [DISCONTINUED] predniSONE (DELTASONE) 10 MG tablet 1 tablet 4 times a day for 2 days,  1 tablet 3 times a day for 2 days,  1 tablet 2 times a day for 2 days, 1 tablet daily for 2 days   No facility-administered encounter medications on file as of 01/26/2016.      Review of Systems  Constitutional: Negative.   HENT: Negative.   Eyes: Negative.    Respiratory: Negative.   Cardiovascular: Negative.   Gastrointestinal: Negative.   Endocrine: Negative.   Genitourinary: Negative.   Musculoskeletal: Negative.   Skin: Negative.   Allergic/Immunologic: Negative.   Neurological: Negative.   Hematological: Negative.   Psychiatric/Behavioral: Negative.        Objective:   Physical Exam  Constitutional: She is oriented to person, place, and time. She appears well-developed and well-nourished. No distress.  HENT:  Head: Normocephalic and atraumatic.  Right Ear: External ear normal.  Left Ear: External ear normal.  Nose: Nose normal.  Mouth/Throat: Oropharynx is clear and moist.  Eyes: Conjunctivae and EOM are normal. Pupils are equal, round, and reactive to light. Right eye exhibits no discharge. Left eye exhibits no discharge. No scleral icterus.  Neck: Normal range of motion. Neck supple. No thyromegaly present.  The patient does have a right carotid bruit   Cardiovascular: Normal rate, regular rhythm, normal heart sounds and intact distal pulses.   No murmur heard. The heart is regular at 60/m  Pulmonary/Chest: Effort normal and breath sounds normal. No respiratory distress. She has no wheezes. She has no rales. She exhibits no tenderness.  Clear anteriorly and posteriorly  Abdominal: Soft. Bowel sounds are normal. She exhibits no mass. There is no tenderness. There is no rebound and no guarding.  No abdominal masses or organ enlargement or bruits  Musculoskeletal: Normal range of motion. She exhibits no edema or tenderness.  Patient is somewhat slow in her movements with a rising and sitting and stepping down from the exam table  Lymphadenopathy:    She has no cervical adenopathy.  Neurological: She is alert and oriented to person, place, and time. She has normal reflexes. No cranial nerve deficit.  Skin: Skin is warm and dry. No rash noted.  Psychiatric: She has a normal mood and affect. Her behavior is normal. Judgment and  thought content normal.  Nursing note and vitals reviewed.  BP 121/59 mmHg  Pulse 65  Temp(Src) 97.5 F (36.4 C) (Oral)  Ht 5' (1.524 m)  Wt 149 lb (67.586 kg)  BMI 29.10 kg/m2        Assessment & Plan:  1. Hyperlipemia -Cholesterol numbers with advanced lipid testing remain elevated but are improved from the previous visit. She will continue with aggressive therapeutic lifestyle changes and current treatment  2. Essential hypertension -The blood pressure is good today and she will continue with current treatment  3. Vitamin D deficiency -The vitamin D level is good and she will continue with current treatment  4. Gastroesophageal reflux disease, esophagitis presence not specified -She has periodic problems with this but no more than usual. She will continue with her proton pump inhibitor.  5. Mitral valve prolapse -She has an upcoming appointment with the cardiologist in about 1 week.  6. Aortic insufficiency -Follow-up with cardiology  7. Carotid artery disease, unspecified laterality ([place]) -Follow-up with cardiology. A bruit was  heard on the right carotid artery.  8. Elevated blood sugar -The patient requests to watch her diet more closely over the next 4 weeks and she will bring blood sugar readings in to visit with the clinical pharmacist and we will make a decision at that point in time if we need to start metformin or not.  9. Primary osteoarthritis of both hips -Follow-up with orthopedist  Patient Instructions                       Medicare Annual Wellness Visit  [facility] and the medical providers at [facility] strive to bring you the best medical care.  In doing so we not only want to address your current medical conditions and concerns but also to detect new conditions early and prevent illness, disease and health-related problems.    Medicare offers a yearly Wellness Visit which allows our clinical staff to assess your need for  preventative services including immunizations, lifestyle education, counseling to decrease risk of preventable diseases and screening for fall risk and other medical concerns.    This visit is provided free of charge (no copay) for all Medicare recipients. The clinical pharmacists at [facility] have begun to conduct these Wellness Visits which will also include a thorough review of all your medications.    As you primary medical provider recommend that you make an appointment for your Annual Wellness Visit if you have not done so already this year.  You may set up this appointment before you leave today or you may call back [phone] and schedule an appointment.  Please make sure when you call that you mention that you are scheduling your Annual Wellness Visit with the clinical pharmacist so that the appointment may be made for the proper length of time.     Continue current medications. Continue good therapeutic lifestyle changes which include good diet and exercise. Fall precautions discussed with patient. If an FOBT was given today- please return it to our front desk. If you are over 4 years old - you may need Prevnar 34 or the adult Pneumonia vaccine.  **Flu shots are available--- please call and schedule a FLU-CLINIC appointment**  After your visit with [place] today you will receive a survey in the mail or online from [employer] regarding your care with [place]. Please take a moment to fill this out. Your feedback is very important to [place] as you can help [place] better understand your patient needs as well as improve your experience and satisfaction. WE CARE ABOUT YOU!!!   Watch diet more aggressively Bring blood sugars in for review with clinical pharmacists in about 4 weeks Check blood sugars fasting and 2 hours after eating and occasionally before meals Stay active as much as your hips will allow you to be active Follow-up with orthopedic surgeon as planned Don't  climb   Arrie Senate MD

## 2016-01-26 NOTE — Patient Instructions (Addendum)
Medicare Annual Wellness Visit  [facility] and the medical providers at [facility] strive to bring you the best medical care.  In doing so we not only want to address your current medical conditions and concerns but also to detect new conditions early and prevent illness, disease and health-related problems.    Medicare offers a yearly Wellness Visit which allows our clinical staff to assess your need for preventative services including immunizations, lifestyle education, counseling to decrease risk of preventable diseases and screening for fall risk and other medical concerns.    This visit is provided free of charge (no copay) for all Medicare recipients. The clinical pharmacists at [facility] have begun to conduct these Wellness Visits which will also include a thorough review of all your medications.    As you primary medical provider recommend that you make an appointment for your Annual Wellness Visit if you have not done so already this year.  You may set up this appointment before you leave today or you may call back [phone] and schedule an appointment.  Please make sure when you call that you mention that you are scheduling your Annual Wellness Visit with the clinical pharmacist so that the appointment may be made for the proper length of time.     Continue current medications. Continue good therapeutic lifestyle changes which include good diet and exercise. Fall precautions discussed with patient. If an FOBT was given today- please return it to our front desk. If you are over 79 years old - you may need Prevnar 27 or the adult Pneumonia vaccine.  **Flu shots are available--- please call and schedule a FLU-CLINIC appointment**  After your visit with [place] today you will receive a survey in the mail or online from [employer] regarding your care with [place]. Please take a moment to fill this out. Your feedback is very  important to [place] as you can help [place] better understand your patient needs as well as improve your experience and satisfaction. WE CARE ABOUT YOU!!!   Watch diet more aggressively Bring blood sugars in for review with clinical pharmacists in about 4 weeks Check blood sugars fasting and 2 hours after eating and occasionally before meals Stay active as much as your hips will allow you to be active Follow-up with orthopedic surgeon as planned Don't climb

## 2016-01-31 NOTE — Telephone Encounter (Signed)
Done up front for pickup

## 2016-02-01 ENCOUNTER — Telehealth: Payer: Self-pay | Admitting: Family Medicine

## 2016-02-01 ENCOUNTER — Ambulatory Visit (INDEPENDENT_AMBULATORY_CARE_PROVIDER_SITE_OTHER): Payer: Medicare Other | Admitting: Pharmacist

## 2016-02-01 VITALS — Ht 60.0 in | Wt 150.0 lb

## 2016-02-01 DIAGNOSIS — R7303 Prediabetes: Secondary | ICD-10-CM | POA: Diagnosis not present

## 2016-02-01 NOTE — Progress Notes (Signed)
Patient ID: Melissa Carpenter, female   DOB: 1933/08/19, 82 y.o.   MRN: [id number]  Diabetes Follow-Up Visit  CC: elevated BG / prediabetes Filed Weights   02/01/16 1437  Weight: 150 lb (68.04 kg)   Body mass index is 29.3 kg/(m^2).   HPI: patient was referred by Dr Laurance Flatten to discuss diet for prediabetes. Last week patient has BG checked an dwas 123.  A1c was add and found to be 6.4% (01/18/2016) Patient was given glucometer rx to start checking BG but she has not gotten because Medicare does not cover testing supplies for the diagnosis of pre diabetes   Current outpatient prescriptions:  .  amLODipine-atorvastatin (CADUET) 10-40 MG tablet, Take 1 tablet by mouth daily., Disp: 30 tablet, Rfl: 6 .  aspirin 81 MG tablet, Take 81 mg by mouth daily., Disp: , Rfl:  .  blood glucose meter kit and supplies KIT, Dispense based on patient and insurance preference. Use daily as directed., Disp: 1 each, Rfl: 11 .  Calcium Carbonate-Vitamin D (CALTRATE 600+D) 600-400 MG-UNIT per tablet, Take 1 tablet by mouth daily., Disp: , Rfl:  .  cholecalciferol (VITAMIN D) 400 UNITS TABS, Take 1,000 Units by mouth.  , Disp: , Rfl:  .  fish oil-omega-3 fatty acids 1000 MG capsule, Take 1 g by mouth daily.  , Disp: , Rfl:  .  metoprolol succinate (TOPROL-XL) 100 MG 24 hr tablet, TAKE 1 TABLET (100 MG TOTAL) BY MOUTH DAILY. TAKE WITH OR IMMEDIATELY FOLLOWING A MEAL., Disp: 90 tablet, Rfl: 0 .  metoprolol tartrate (LOPRESSOR) 25 MG tablet, Take 1 tablet (25 mg total) by mouth as needed., Disp: 30 tablet, Rfl: 3 .  Multiple Vitamin (MULTIVITAMIN) tablet, Take 1 tablet by mouth daily.  , Disp: , Rfl:  .  olmesartan (BENICAR) 40 MG tablet, TAKE 1 TABLET BY MOUTH EVERY DAY, Disp: 90 tablet, Rfl: 0 .  pantoprazole (PROTONIX) 40 MG tablet, TAKE 1 TABLET EVERY DAY, Disp: 90 tablet, Rfl: 0   Home BG Monitoring:  Checking 0 times a day.  Low fat/carbohydrate diet?  No Nicotine Abuse?  No Medication Compliance?  Yes Exercise?   No - due to orthopedic problem - pain in both hips Alcohol Abuse?  No   Lab Results  Component Value Date   HGBA1C 6.4* 01/18/2016    No results found for: Derl Barrow  Lab Results  Component Value Date   CHOL 157 01/18/2016   HDL 40 01/18/2016   LDLCALC 83 03/18/2014   TRIG 259* 01/18/2016      Assessment: Pre diabetes  Recommendations: 1.  Medication recommendations at this time are as follows:  None 2.  Reviewed HBG goals:  Fasting 80-120 and 1-2 hour post prandial <180.  Patient is instructed to check BG 2-3 times per week   3.  Patient was given one touch verio glucometer and #30 test strips. She was taught how to check BG and reminded to being glucometer to next appt. 4. Dietary recommendations:  Discussed CHO counting diet - handout given 5.  Physical Activity recommendations:  Increase as able but limited by hip pain.   6.  Return to clinic in 4-6 wks   Time spent counseling patient:  45 minutes  Referring provider:  Redge Gainer

## 2016-02-01 NOTE — Patient Instructions (Addendum)
Start checking blood glucose/sugar every other day.  Check at varying times.  Goal blood glucose is: 80 to 120 if you are fasting (nothing to eat within the last 4 hours) Less than 180 if you have eaten within 2 hours.   Prediabetes Many people have heard about type 2 diabetes, but its common precursor, prediabetes, doesn't get as much attention. Prediabetes is estimated by CDC to affect 86 million Americans (this includes 51% of people 65 years and older), and an estimated 90% of people with prediabetes don't even know it. According to the CDC, 15-30% of these individuals will develop type 2 diabetes within five years. In other words, as many as 26 million people that currently have prediabetes could develop type 2 diabetes by 2020, effectively doubling the number of people with type 2 diabetes in the [place].  What is prediabetes? Prediabetes is a condition where blood sugar levels are higher than normal, but not high enough to be diagnosed as type 2 diabetes. This occurs when the body has problems in processing glucose properly, and sugar starts to build up in the bloodstream instead of fueling cells in muscles and tissues. Insulin is the hormone that tells cells to take up glucose, and in prediabetes, people typically initially develop insulin resistance (where the body's cells can't respond to insulin as well), and over time (if no actions are taken to reverse the situation) the ability to produce sufficient insulin is reduced. People with prediabetes also commonly have high blood pressure as well as abnormal blood lipids (e.g. cholesterol). These often occur prior to the rise of blood glucose levels.  What are the symptoms of prediabetes? People typically do not have symptoms of prediabetes, which is partially why up to 90% of people don't know they have it. The ADA reports that some people with prediabetes may develop symptoms of type 2 diabetes, though even many people diagnosed with type 2 diabetes  show little or no symptoms initially at diagnosis.  How is prediabetes diagnosed? According to the American Diabetes Association, prediabetes can be diagnosed through one of the following tests: 1. A glycated hemoglobin test, also known as HbA1c or simply A1c, gives an idea of the body's average blood sugar levels from the past two or three months. It is usually done with a small drop of blood from a fingerstick or as part of having blood taken in a doctor's office, hospital, or laboratory. A1c Level Diagnosis  Less than 5.7% Normal  5.7% to 6.4% Prediabetes  6.5% and higher Diabetes  2. A fasting plasma glucose (FPG) test measures a person's blood glucose level after fasting (not eating) for eight hours - this is typically done in the morning. If a test shows positive for prediabetes, a second test should be taken on a different day to confirm the diagnosis. FPG Level Diagnosis  Less than 100 mg/dl Normal  100 mg/dl to 125 mg/dl Prediabetes  126 mg/dl and higher Diabetes   Who is at risk of developing prediabetes? A well-known paper published in the Lancet in 2010 recommends screening for type 2 diabetes (which would also screen for prediabetes) every 3-5 years in all adults over the age of 28, regardless of other risk factors. Overweight and obese adults (a BMI >25 kg/m2) are also at significantly greater risk for developing prediabetes, as well as people with a family history of type 2 diabetes. According to the CDC, several other factors can have moderate influences on prediabetes risk in addition to age, weight, and  family history: People with an [place] American, Hispanic/Latino, American [place] American, or [place]er racial or ethnic background. The 2015 ADA Standards of Medical Care recommendations suggest Asian Americans with a BMI of 23 or above be screened for type 2 diabetes.  Women with a history of diabetes during pregnancy ("gestational diabetes") or have given birth to  a baby weighing nine pounds or more. People who are physically active fewer than three times a week. The CDC offers a fast, online screening test for evaluating the risk for prediabetes. The ADA also offers a screening test to assess type 2 diabetes risk. Of course, these tests do not themselves confirm a prediabetes diagnosis, but just if someone may be at higher risk of developing it.  Why do people develop prediabetes? Prediabetes develops through a combination of factors that are still being investigated. For sure, lifestyle factors (food, exercise, stress, sleep) play a role, but family history and genetics certainly do as well. It is easy to assume that prediabetes is the result of being overweight, but the relationship is not that simple. While obesity is one underlying cause of insulin resistance, many overweight individuals may never develop prediabetes or type 2 diabetes, and a minority of people with prediabetes have never been overweight. To make matters worse, it can be increasingly difficult to make healthy choices in today's toxic food environment that steers all of [place] to make the wrong food choices, and there are many factors that can contribute to weight gain in addition to diet.  Is a prediabetes diagnosis serious? There has been significant debate around the term 'prediabetes,' and whether it should be considered cause for alarm. On the one hand, it serves as a risk factor for type 2 diabetes and a host of other complications, including heart disease, and ultimately prediabetes implies that a degree of metabolic problems have started to occur in the body. On the other hand, it places a diagnosis on many people who may never develop type 2 diabetes. Again, according to the CDC, 15-30% of those with prediabetes will develop type 2 diabetes within five years. However, a 2012 Lancet article cites 5-10% of those with prediabetes each year will also revert back to healthy blood sugars. What's  critical is not necessarily the cutoff itself, but where someone falls within the ranges listed above. The level of risk of developing type 2 diabetes is closely related to A1c or FPG at diagnosis. Those in the higher ranges (A1c closer to 6.4%, FPG closer to 125 mg/dl) are much more likely to progress to type 2 diabetes, whereas those at lower ranges (A1c closer to 5.7%, FPG closer to 100 mg/dl) are relatively more likely to revert back to normal glucose levels or stay within the prediabetes range. Age of diagnosis and the level of insulin production still occurring at diagnosis also impact the chances of reverting to normoglycemia (normal blood sugar levels).  What can people with prediabetes do to avoid the progression from prediabetes to type 2 diabetes? The most important action people diagnosed with prediabetes can take is to focus on living a healthy lifestyle. This includes making healthy food choices, controlling portions, and increasing physical activity. Regarding weight control, research shows losing 5-7% (often about 10-20 lbs.) from your initial body weight and keeping off as much of that weight over time as possible is critical to lowering the risk of type 2 diabetes. This task is of course easier said than done, but sustained weight loss over time can be  key to improving health and delaying or preventing the onset of type 2 diabetes. Several prediabetes interventions exist based on evidence from the landmark Diabetes Prevention Program (DPP) study. The DPP study reported that moderate weight loss (5-7% of body weight, or ~10-15 lbs. for someone weighing 200 lbs.), counseling, and education on healthy eating and behavior reduced the risk of developing type 2 diabetes by 58%. Data presented at the [employer] 2014 conference showed that after 15 years of follow-up of the DPP study groups, the results were still encouraging: 27% of those in the original lifestyle group had a significant reduction in type 2  diabetes progression compared to the control group. If you or someone you know has been told they have prediabetes, here are a few helpful resources: In-person diabetes prevention programs: The CDC offers a one year long lifestyle change program through its National Diabetes Prevention Program (NDPP) at various locations throughout the [place] to help participants adopt healthy habits and prevent or delay progression to type 2 diabetes. This program is a major undertaking by the CDC to translate the findings from the DPP study into a real world setting, a significant effort indeed! Online diabetes prevention programs: The CDC has now given pending recognition status to three digital prevention programs: [place], and [facility]. These offer the same one year long educational curriculum as the DPP study, but in an online format. Some insurance companies and employers cover these programs, and you can find more information at the links above. These digital versions are excellent options for those who live far away from NDPP locations or who prefer the anonymity and convenience of doing the program online. Metformin: The DPP study found that metformin, the safest first-line therapy for type 2 diabetes, may help delay the onset of type 2 diabetes in people with prediabetes. Participants who took the low-cost generic drug had a 31% reduced risk of developing type 2 diabetes compared to the control group (those not on metformin or intensive lifestyle intervention). Again, 15-year follow up data showed that 17% of those on metformin continued to have a significant reduction in type 2 progression. At this time, metformin (or any other medication, for that matter) is not currently FDA approved for prediabetes, and it is sometimes prescribed "off-label" by a healthcare provider. Your healthcare provider can give you more information and determine whether metformin is a good option for you.  Can prediabetes  be "cured"? In the early stages of prediabetes (and type 2 diabetes), diligent attention to food choices and activity, and most importantly weight loss, can improve blood sugar numbers, effectively "reversing" the disease and reducing the odds of developing type 2 diabetes. However, some people may have underlying factors (such as family history and genetics) that put them at a greater risk of type 2 diabetes, meaning they will always require careful attention to blood sugar levels and lifestyle choices. Returning to old habits will likely put someone back on the road to prediabetes, and eventually, type 2 diabetes

## 2016-02-01 NOTE — Progress Notes (Signed)
Cardiology Office Note   Date:  02/02/2016   ID:  Melissa Carpenter, DOB 02-10-1934, MRN [id number]  PCP:  Redge Gainer, MD  Cardiologist:   Minus Breeding, MD   No chief complaint on file.     History of Present Illness: Melissa Carpenter is a 82 y.o. female who presents for follow up of palpitations and HTN.  She has seen Dr. Ron Parker in the past for these complaints.  I saw her for the first time in March.  She was having some increased BP and increased palpitations.  She is taking long acting metoprolol and at the last visit I added PRN.  Since then she has done well.  The patient denies any new symptoms such as chest discomfort, neck or arm discomfort. There has been no new shortness of breath, PND or orthopnea. There have been no reported palpitations, presyncope or syncope.  She is active doing her housework and raising a garden.   Past Medical History  Diagnosis Date  . Carotid artery disease ([place])     , Doppler, 2003  . Dyslipidemia   . Mitral valve prolapse     Echo, 2009, very mild intermittent prolapse of the posterior leaflet, no MR  . Ejection fraction     EF 60-70%, echo, December, 2009 / EF 55-60%, December, 2012  . Aortic insufficiency     Mild, echo, December, 2009  . Hypertension   . DJD (degenerative joint disease)   . Chest pain     Catheterization, 2003, no significant CAD  . Edema     November, 2012  . Diastolic dysfunction     Mild diastolic dysfunction, echo, December, 2012  . Tachycardia     Nighttime tachycardia, February, 2014  . Cataract   . GERD (gastroesophageal reflux disease)   . Hyperlipidemia   . Osteoporosis   . Thyroid nodule   . Cellulitis     Past Surgical History  Procedure Laterality Date  . Cardiac catheterization  sept 2003    no significant cad  . Appendectomy    . Tonsillectomy    . Eye surgery    . Biopsy thyroid Left   . Abdominal hysterectomy    . Nasal sinus surgery       Current Outpatient Prescriptions  Medication Sig  Dispense Refill  . amLODipine-atorvastatin (CADUET) 10-40 MG tablet Take 1 tablet by mouth daily. 30 tablet 6  . aspirin 81 MG tablet Take 81 mg by mouth daily.    . Calcium Carbonate-Vitamin D (CALTRATE 600+D) 600-400 MG-UNIT per tablet Take 1 tablet by mouth daily.    . cholecalciferol (VITAMIN D) 400 UNITS TABS Take 1,000 Units by mouth.      . fish oil-omega-3 fatty acids 1000 MG capsule Take 1 g by mouth daily.      . metoprolol succinate (TOPROL-XL) 100 MG 24 hr tablet TAKE 1 TABLET (100 MG TOTAL) BY MOUTH DAILY. TAKE WITH OR IMMEDIATELY FOLLOWING A MEAL. 90 tablet 0  . metoprolol tartrate (LOPRESSOR) 25 MG tablet Take 1 tablet (25 mg total) by mouth as needed. 30 tablet 3  . Multiple Vitamin (MULTIVITAMIN) tablet Take 1 tablet by mouth daily.      Marland Kitchen olmesartan (BENICAR) 40 MG tablet TAKE 1 TABLET BY MOUTH EVERY DAY 90 tablet 0  . pantoprazole (PROTONIX) 40 MG tablet Take 40 mg by mouth daily.    . blood glucose meter kit and supplies KIT Dispense based on patient and insurance preference. Use daily as directed.  1 each 11   No current facility-administered medications for this visit.    Allergies:   Forteo; Codeine; Evista; Morphine; Penicillins; Sulfites; and Sulfonamide derivatives    ROS:  Please see the history of present illness.   Otherwise, review of systems are positive for joint pains..   All other systems are reviewed and negative.    PHYSICAL EXAM: VS:  BP 102/60 mmHg  Pulse 64  Ht 5' (1.524 m)  Wt 148 lb (67.132 kg)  BMI 28.90 kg/m2 , BMI Body mass index is 28.9 kg/(m^2). GENERAL:  Well appearing NECK:  No jugular venous distention, waveform within normal limits, carotid upstroke brisk and symmetric, bilateral bruits, no thyromegaly LYMPHATICS:  No cervical, inguinal adenopathy LUNGS:  Clear to auscultation bilaterally CHEST:  Unremarkable HEART:  PMI not displaced or sustained,S1 and S2 within normal limits, no S3, no S4, no clicks, no rubs, 2 out of 6 apical  systolic murmur radiating slightly out the aortic outflow tract, no diastolic murmurs ABD:  Flat, positive bowel sounds normal in frequency in pitch, no bruits, no rebound, no guarding, no midline pulsatile mass, no hepatomegaly, no splenomegaly EXT:  2 plus pulses throughout, no edema, no cyanosis no clubbing    EKG:  EKG is ordered today. Normal sinus rhythm, rate 62, axis within normal limits, intervals within normal limits, no acute ST wave changes.   Recent Labs: 01/18/2016: ALT 28; BUN 14; Creatinine, Ser 0.51*; Platelets 222; Potassium 4.2; Sodium 138    Lipid Panel    Component Value Date/Time   CHOL 157 01/18/2016 1040   CHOL 165 01/13/2013 0833   TRIG 259* 01/18/2016 1040   TRIG 240* 01/13/2013 0833   HDL 40 01/18/2016 1040   HDL 39* 01/13/2013 0833   LDLCALC 83 03/18/2014 0819   LDLCALC 78 01/13/2013 0833      Wt Readings from Last 3 Encounters:  02/02/16 148 lb (67.132 kg)  02/01/16 150 lb (68.04 kg)  01/26/16 149 lb (67.586 kg)      Other studies Reviewed: Additional studies/ records that were reviewed today include:  Carotid  Review of the above records demonstrates:  Please see elsewhere in the note.     ASSESSMENT AND PLAN:  HTN:  The blood pressure is at target. No change in medications is indicated. We will continue with therapeutic lifestyle changes (TLC).  PALPITATIONS:  These are improved.  No change in therapy is indicated.   CAROTID STENOSIS:  She was confused because the most recent Doppler done this year suggested only mild stenosis where it was listed as moderate on the right side in the past. I explained that this is probably unchanged. I would suspect the right side is on the lower range of moderate or higher range of mild and that follow-up in a couple of years would be reasonable along with risk reduction. She seemed to understand this explanation and why we might not continue to do yearly exams and they have not changed appreciably or altered  her management.Marland Kitchen  MURMUR:  I will follow this clinically.  I will consider repeating an echocardiogram at the next visit as it has been several years since the last one.  HTN:   Her blood pressure was elevated but now seems to be trending down and she will continue the meds as listed.   Current medicines are reviewed at length with the patient today.  The patient does not have concerns regarding medicines.  The following changes have been made:  no change  Labs/ tests ordered today include:   Orders Placed This Encounter  Procedures  . EKG 12-Lead     Disposition:   FU with me in 12 months.    Signed, Minus Breeding, MD  02/02/2016 12:24 PM    [place]

## 2016-02-02 ENCOUNTER — Ambulatory Visit (INDEPENDENT_AMBULATORY_CARE_PROVIDER_SITE_OTHER): Payer: Medicare Other | Admitting: Cardiology

## 2016-02-02 ENCOUNTER — Encounter: Payer: Self-pay | Admitting: Cardiology

## 2016-02-02 VITALS — BP 102/60 | HR 64 | Ht 60.0 in | Wt 148.0 lb

## 2016-02-02 DIAGNOSIS — I6523 Occlusion and stenosis of bilateral carotid arteries: Secondary | ICD-10-CM

## 2016-02-02 DIAGNOSIS — I10 Essential (primary) hypertension: Secondary | ICD-10-CM | POA: Diagnosis not present

## 2016-02-02 NOTE — Patient Instructions (Signed)
Medication Instructions:  The current medical regimen is effective;  continue present plan and medications.  Follow-Up: Follow up in 1 year with Dr. Hochrein in [place].  You will receive a letter in the mail 2 months before you are due.  Please call us when you receive this letter to schedule your follow up appointment.  If you need a refill on your cardiac medications before your next appointment, please call your pharmacy.  Thank you for choosing [facility]!!     

## 2016-02-02 NOTE — Telephone Encounter (Signed)
Pt does not want, [facility] aware

## 2016-02-03 ENCOUNTER — Other Ambulatory Visit: Payer: Self-pay | Admitting: Family Medicine

## 2016-02-03 ENCOUNTER — Telehealth: Payer: Self-pay | Admitting: Family Medicine

## 2016-02-03 NOTE — Telephone Encounter (Signed)
Patient aware of results at appointment.

## 2016-02-18 ENCOUNTER — Encounter: Payer: Self-pay | Admitting: Family Medicine

## 2016-02-18 ENCOUNTER — Ambulatory Visit (INDEPENDENT_AMBULATORY_CARE_PROVIDER_SITE_OTHER): Payer: Medicare Other | Admitting: Family Medicine

## 2016-02-18 VITALS — BP 112/57 | HR 81 | Temp 97.4°F | Ht 61.23 in | Wt 148.4 lb

## 2016-02-18 DIAGNOSIS — L03115 Cellulitis of right lower limb: Secondary | ICD-10-CM | POA: Diagnosis not present

## 2016-02-18 DIAGNOSIS — I6523 Occlusion and stenosis of bilateral carotid arteries: Secondary | ICD-10-CM | POA: Diagnosis not present

## 2016-02-18 MED ORDER — DOXYCYCLINE HYCLATE 100 MG PO TABS: 100.0000 mg | ORAL_TABLET | Freq: Two times a day (BID) | ORAL | Status: DC

## 2016-02-18 NOTE — Progress Notes (Signed)
   HPI  Patient presents today here with bug bite  Pt states that 3 days ago she had sudden sting on the R medial lower leg that lasted months and went away. Over the next 2-3 days she developed the area of redness that brought her in  Describes elevated temp to 99.9, decreased energy, and tenderness of the area.   No dysnea or cough, She is tolerating food and fluids normally.    PMH: Smoking status noted ROS: Per HPI  Objective: BP 112/57 mmHg  Pulse 81  Temp(Src) 97.4 F (36.3 C) (Oral)  Ht 5' 1.23" (1.555 m)  Wt 148 lb 6.4 oz (67.314 kg)  BMI 27.84 kg/m2 Gen: NAD, alert, cooperative with exam HEENT: NCAT CV: RRR, good S1/S2, no murmur Resp: CTABL, no wheezes, non-labored Ext: No edema, warm Neuro: Alert and oriented, No gross deficits  14 cm by 14 cm area erythema na dredness on the medial R leg with central area of induration approx 2-3 cm in diameter, warm, no drainage or induration.   Assessment and plan:  # cellulitis Likely stated atfter the sting Treat with doxy, penicillin allergy and sulfa allergy RTC with any concerns or worsening symptoms.  No signs of abscess    Meds ordered this encounter  Medications  . doxycycline (VIBRA-TABS) 100 MG tablet    Sig: Take 1 tablet (100 mg total) by mouth 2 (two) times daily. 1 po bid    Dispense:  20 tablet    Refill:  0    Laroy Apple, MD [facility] 02/18/2016, 3:51 PM

## 2016-02-18 NOTE — Patient Instructions (Signed)
Great to meet you!  I have sent doxycycline to your pharmacy to treat the infection on your leg  Please finish all of your antibiotics  Please come back if you have any concerns  Cellulitis Cellulitis is an infection of the skin and the tissue beneath it. The infected area is usually red and tender. Cellulitis occurs most often in the arms and lower legs.  CAUSES  Cellulitis is caused by bacteria that enter the skin through cracks or cuts in the skin. The most common types of bacteria that cause cellulitis are staphylococci and streptococci. SIGNS AND SYMPTOMS   Redness and warmth.  Swelling.  Tenderness or pain.  Fever. DIAGNOSIS  Your health care provider can usually determine what is wrong based on a physical exam. Blood tests may also be done. TREATMENT  Treatment usually involves taking an antibiotic medicine. HOME CARE INSTRUCTIONS   Take your antibiotic medicine as directed by your health care provider. Finish the antibiotic even if you start to feel better.  Keep the infected arm or leg elevated to reduce swelling.  Apply a warm cloth to the affected area up to 4 times per day to relieve pain.  Take medicines only as directed by your health care provider.  Keep all follow-up visits as directed by your health care provider. SEEK MEDICAL CARE IF:   You notice red streaks coming from the infected area.  Your red area gets larger or turns dark in color.  Your bone or joint underneath the infected area becomes painful after the skin has healed.  Your infection returns in the same area or another area.  You notice a swollen bump in the infected area.  You develop new symptoms.  You have a fever. SEEK IMMEDIATE MEDICAL CARE IF:   You feel very sleepy.  You develop vomiting or diarrhea.  You have a general ill feeling (malaise) with muscle aches and pains.   This information is not intended to replace advice given to you by your health care provider. Make  sure you discuss any questions you have with your health care provider.   Document Released: 05/10/2005 Document Revised: 04/21/2015 Document Reviewed: 10/16/2011 Elsevier Interactive Patient Education [employer].

## 2016-02-23 ENCOUNTER — Ambulatory Visit: Payer: Medicare Other | Admitting: Pharmacist

## 2016-03-08 ENCOUNTER — Encounter: Payer: Self-pay | Admitting: Pharmacist

## 2016-03-08 ENCOUNTER — Ambulatory Visit (INDEPENDENT_AMBULATORY_CARE_PROVIDER_SITE_OTHER): Payer: Medicare Other | Admitting: Pharmacist

## 2016-03-08 VITALS — BP 132/64 | HR 64 | Ht 60.0 in | Wt 146.0 lb

## 2016-03-08 DIAGNOSIS — Z0000 Encounter for general adult medical examination without abnormal findings: Secondary | ICD-10-CM | POA: Diagnosis not present

## 2016-03-08 NOTE — Progress Notes (Signed)
Subjective:   Melissa Carpenter is a 82 y.o. female who presents for a subsequent Medicare Annual Wellness Visit.  I also met with Melissa Carpenter about 5 weeks ago to discuss prediabetes, BG, diet and weight.  She reports that she has been doing well with recommended diet changes - limiting high CHO foods, limiting serving sizes and has lost about 4 lbs since our last visit.   Review of Systems  Review of Systems  Constitutional: Negative.   HENT: Negative.   Eyes: Negative.   Respiratory: Negative.   Cardiovascular: Negative.   Gastrointestinal: Negative.   Genitourinary: Positive for urgency.  Musculoskeletal: Positive for joint pain (right hip pain from time to time but denies pain today - sees Dr Alvan Dame ).  Skin: Negative.   Neurological: Negative.   Endo/Heme/Allergies: Negative.   Psychiatric/Behavioral: Negative.    Current Medications (verified) Outpatient Encounter Prescriptions as of 03/08/2016  Medication Sig  . amLODipine-atorvastatin (CADUET) 10-40 MG tablet Take 1 tablet by mouth daily.  Marland Kitchen aspirin 81 MG tablet Take 81 mg by mouth daily.  . blood glucose meter kit and supplies KIT Dispense based on patient and insurance preference. Use daily as directed.  . Calcium Carbonate-Vitamin D (CALTRATE 600+D) 600-400 MG-UNIT per tablet Take 1 tablet by mouth daily.  . cholecalciferol (VITAMIN D) 400 UNITS TABS Take 1,000 Units by mouth.    . fish oil-omega-3 fatty acids 1000 MG capsule Take 1 g by mouth daily.    . metoprolol succinate (TOPROL-XL) 100 MG 24 hr tablet TAKE 1 TABLET (100 MG TOTAL) BY MOUTH DAILY. TAKE WITH OR IMMEDIATELY FOLLOWING A MEAL.  . metoprolol tartrate (LOPRESSOR) 25 MG tablet Take 1 tablet (25 mg total) by mouth as needed.  . Multiple Vitamin (MULTIVITAMIN) tablet Take 1 tablet by mouth daily.    Marland Kitchen olmesartan (BENICAR) 40 MG tablet TAKE 1 TABLET BY MOUTH EVERY DAY  . pantoprazole (PROTONIX) 40 MG tablet Take 40 mg by mouth daily.  . [DISCONTINUED] doxycycline  (VIBRA-TABS) 100 MG tablet Take 1 tablet (100 mg total) by mouth 2 (two) times daily. 1 po bid (Patient not taking: Reported on 03/08/2016)   No facility-administered encounter medications on file as of 03/08/2016.     Allergies (verified) Forteo [teriparatide (recombinant)]; Codeine; Evista [raloxifene]; Morphine; Penicillins; Sulfites; and Sulfonamide derivatives   History: Past Medical History:  Diagnosis Date  . Aortic insufficiency    Mild, echo, December, 2009  . Carotid artery disease ([place])    , Doppler, 2003  . Cataract   . Cellulitis   . Chest pain    Catheterization, 2003, no significant CAD  . Diastolic dysfunction    Mild diastolic dysfunction, echo, December, 2012  . DJD (degenerative joint disease)   . Dyslipidemia   . Edema    November, 2012  . Ejection fraction    EF 60-70%, echo, December, 2009 / EF 55-60%, December, 2012  . GERD (gastroesophageal reflux disease)   . Hyperlipidemia   . Hypertension   . Mitral valve prolapse    Echo, 2009, very mild intermittent prolapse of the posterior leaflet, no MR  . Osteoporosis   . Tachycardia    Nighttime tachycardia, February, 2014  . Thyroid nodule    Past Surgical History:  Procedure Laterality Date  . ABDOMINAL HYSTERECTOMY    . APPENDECTOMY    . BIOPSY THYROID Left   . CARDIAC CATHETERIZATION  sept 2003   no significant cad  . EYE SURGERY    . NASAL SINUS  SURGERY    . TONSILLECTOMY     Family History  Problem Relation Age of Onset  . Heart attack Mother   . Cancer Mother     THROAT / VOCAL CORD  . Hypertension Mother   . Hypertension Father   . Bone cancer Sister   . Stroke Sister   . Lung cancer Grandchild   . Hypertension Sister   . Metabolic syndrome Sister     pre diabetes  . GER disease Sister   . Heart disease Sister     CAD  . Heart attack Brother   . Cirrhosis Brother   . Cancer Sister     BREAST / BONE  . Hyperlipidemia Sister   . Heart attack Sister    Social History    Occupational History  . Not on file.   Social History Main Topics  . Smoking status: Never Smoker  . Smokeless tobacco: Never Used  . Alcohol use No  . Drug use: No  . Sexual activity: No    Do you feel safe at home?  Yes Are there smokers in your home (other than you)? No  Dietary issues and exercise activities: Current Exercise Habits: The patient does not participate in regular exercise at present, Exercise limited by: orthopedic condition(s) (due to hip pain)  Current Dietary habits:  See HPI above for information regarding recent dietary changes   Objective:    Today's Vitals   03/08/16 1001  BP: 132/64  Pulse: 64  Weight: 146 lb (66.2 kg)  Max Adult Ht:  5' 3"  Height: 5' (1.524 m)    PainSc: 0-No pain   Body mass index is 28.51 kg/m. (Has decreased from 29.29 over the last 5 weeks)   HBG readings - 81, 92, 91, 95, 112, 124, 95, 122, 109, 102, 135  Activities of Daily Living In your present state of health, do you have any difficulty performing the following activities: 03/08/2016  Hearing? N  Vision? N  Difficulty concentrating or making decisions? N  Walking or climbing stairs? Y  Dressing or bathing? N  Doing errands, shopping? N  Preparing Food and eating ? N  Using the Toilet? N  In the past six months, have you accidently leaked urine? Y  Do you have problems with loss of bowel control? N  Managing your Medications? N  Managing your Finances? N  Housekeeping or managing your Housekeeping? N  Some recent data might be hidden     Cardiac Risk Factors include: advanced age (>66mn, >>64women);dyslipidemia;hypertension;sedentary lifestyle  Depression Screen PHQ 2/9 Scores 03/08/2016 02/18/2016 01/26/2016 12/30/2015  PHQ - 2 Score 0 0 0 0     Fall Risk Fall Risk  03/08/2016 02/18/2016 01/26/2016 12/30/2015 09/24/2015  Falls in the past year? _0   Number falls in past yr: - - - - -  Injury with Fall? - - - - -  Risk Factor Category  - - - - -   Follow up - - - - -    Cognitive Function: MMSE - Mini Mental State Exam 03/08/2016 03/08/2015  Orientation to time 5 5  Orientation to Place 5 5  Registration 3 3  Attention/ Calculation 5 5  Recall 3 3  Language- name 2 objects 2 2  Language- repeat 1 1  Language- follow 3 step command 3 3  Language- read & follow direction 1 1  Write a sentence 1 1  Copy design 1 1  Total score 30  30    Immunizations and Health Maintenance Immunization History  Administered Date(s) Administered  . Influenza,inj,Quad PF,36+ Mos 05/26/2013, 05/25/2014, 06/08/2015  . Pneumococcal Conjugate-13 07/30/2013  . Pneumococcal Polysaccharide-23 09/14/2004  . Tdap 05/15/2011   There are no preventive care reminders to display for this patient.  Patient Care Team: Chipper Herb, MD as PCP - General (Family Medicine) Paralee Cancel, MD as Consulting Physician (Orthopedic Surgery) Minus Breeding, MD as Consulting Physician (Cardiology) Angelia Mould, MD as Consulting Physician (Vascular Surgery)  Indicate any recent Medical Services you may have received from other than [facility] providers in the past year (date may be approximate).    Assessment:    Annual Wellness Visit  Urinary incontinence Pre diabetes -  Great dietary changes and HBG readings look great.   Screening Tests Health Maintenance  Topic Date Due  . INFLUENZA VACCINE  03/14/2016  . DEXA SCAN  02/11/2017  . TETANUS/TDAP  05/14/2021  . ZOSTAVAX  Completed  . PNA vac Low Risk Adult  Completed        Plan:   During the course of the visit Gerldine was educated and counseled about the following appropriate screening and preventive services:   Vaccines to include Pneumoccal, Influenza, Td, Zostavax - all vaccines are UTD  Colorectal cancer screening - FOBT and colonoscopy UTD  Cardiovascular disease screening - Carotid Duplex 10/2015; [place] of lower extrem. 05/2015; EKG 01/2016  BP at goal today  Last lipid panel showed LDL  at goal but elevated Tg - patient is taking statin (atorvastatin).  Tg should improved with recent dietary changes. Due to recheck lipids in 2-3 months.  Diabetes screening - last A1c = 6.4% - pre diabetes; patient has made great dietary changes and weight has decreased.  Anticipate that A1c will be lower when rechecked in November 2017.  Bone Denisty / Osteoporosis Screening - UTD  Mammogram - has appt 03/2016  Glaucoma screening / Eye Exam - UTD  Nutrition counseling - continue with current changes  Advanced Directives - UTD - patient brought in copies today.  Will be scanned.  Physical Activity - as able;  Encouraged patient to make follow up with Dr Alvan Dame since she is still having hip pain.  Information provided about OAB and discussed treatment options.  Patient does not want to add any meds currently.  Kegel exercises discuss and handout provided. To follow up with PCP.      Patient Instructions (the written plan) were given to the patient.   Cherre Robins, PharmD   03/08/2016

## 2016-03-08 NOTE — Patient Instructions (Addendum)
Melissa Carpenter , Thank you for taking time to come for your Medicare Wellness Visit. I appreciate your ongoing commitment to your health goals. Please review the following plan we discussed and let me know if I can assist you in the future.   These are the goals we discussed:  Continue with current changes in diet - you have lost 4 lbs.    Recommend follow up with Dr Alvan Dame for hip pain.   Increase non-starchy vegetables - carrots, green bean, squash, zucchini, tomatoes, onions, peppers, spinach and other green leafy vegetables, cabbage, lettuce, cucumbers, asparagus, okra (not fried), eggplant Limit sugar and processed foods (cakes, cookies, ice cream, crackers and chips) Increase fresh fruit but limit serving sizes 1/2 cup or about the size of tennis or baseball Limit red meat to no more than 1-2 times per week (serving size about the size of your palm) Choose whole grains / lean proteins - whole wheat bread, quinoa, whole grain rice (1/2 cup), fish, chicken, [place] Avoid sugar and calorie containing beverages - soda, sweet tea and juice.  Choose water or unsweetened tea instead.    This is a list of the screening recommended for you and due dates:  Health Maintenance  Topic Date Due  . Flu Shot  03/14/2016  . DEXA scan (bone density measurement)  02/11/2017  . Tetanus Vaccine  05/14/2021  . Shingles Vaccine  Completed  . Pneumonia vaccines  Completed    Health Maintenance, Female Adopting a healthy lifestyle and getting preventive care can go a long way to promote health and wellness. Talk with your health care provider about what schedule of regular examinations is right for you. This is a good chance for you to check in with your provider about disease prevention and staying healthy. In between checkups, there are plenty of things you can do on your own. Experts have done a lot of research about which lifestyle changes and preventive measures are most likely to keep you healthy. Ask your  health care provider for more information. WEIGHT AND DIET  Eat a healthy diet  Be sure to include plenty of vegetables, fruits, low-fat dairy products, and lean protein.  Do not eat a lot of foods high in solid fats, added sugars, or salt.  Get regular exercise. This is one of the most important things you can do for your health.  Most adults should exercise for at least 150 minutes each week. The exercise should increase your heart rate and make you sweat (moderate-intensity exercise).  Most adults should also do strengthening exercises at least twice a week. This is in addition to the moderate-intensity exercise.  Maintain a healthy weight  Body mass index (BMI) is a measurement that can be used to identify possible weight problems. It estimates body fat based on height and weight. Your health care provider can help determine your BMI and help you achieve or maintain a healthy weight.  For females 41 years of age and older:   A BMI below 18.5 is considered underweight.  A BMI of 18.5 to 24.9 is normal.  A BMI of 25 to 29.9 is considered overweight.  A BMI of 30 and above is considered obese.  Watch levels of cholesterol and blood lipids  You should start having your blood tested for lipids and cholesterol at 80 years of age, then have this test every 5 years.  You may need to have your cholesterol levels checked more often if:  Your lipid or cholesterol levels are  high.  You are older than 80 years of age.  You are at high risk for heart disease.  CANCER SCREENING   Lung Cancer  Lung cancer screening is recommended for adults 56-63 years old who are at high risk for lung cancer because of a history of smoking.  A yearly low-dose CT scan of the lungs is recommended for people who:  Currently smoke.  Have quit within the past 15 years.  Have at least a 30-pack-year history of smoking. A pack year is smoking an average of one pack of cigarettes a day for 1  year.  Yearly screening should continue until it has been 15 years since you quit.  Yearly screening should stop if you develop a health problem that would prevent you from having lung cancer treatment.  Breast Cancer  Practice breast self-awareness. This means understanding how your breasts normally appear and feel.  It also means doing regular breast self-exams. Let your health care provider know about any changes, no matter how small.  If you are in your 20s or 30s, you should have a clinical breast exam (CBE) by a health care provider every 1-3 years as part of a regular health exam.  If you are 28 or older, have a CBE every year. Also consider having a breast X-ray (mammogram) every year.  If you have a family history of breast cancer, talk to your health care provider about genetic screening.  If you are at high risk for breast cancer, talk to your health care provider about having an MRI and a mammogram every year.  Breast cancer gene (BRCA) assessment is recommended for women who have family members with BRCA-related cancers. BRCA-related cancers include:  Breast.  Ovarian.  Tubal.  Peritoneal cancers.  Results of the assessment will determine the need for genetic counseling and BRCA1 and BRCA2 testing. Cervical Cancer Your health care provider may recommend that you be screened regularly for cancer of the pelvic organs (ovaries, uterus, and vagina). This screening involves a pelvic examination, including checking for microscopic changes to the surface of your cervix (Pap test). You may be encouraged to have this screening done every 3 years, beginning at age 30.  For women ages 64-65, health care providers may recommend pelvic exams and Pap testing every 3 years, or they may recommend the Pap and pelvic exam, combined with testing for human papilloma virus (HPV), every 5 years. Some types of HPV increase your risk of cervical cancer. Testing for HPV may also be done on  women of any age with unclear Pap test results.  Other health care providers may not recommend any screening for nonpregnant women who are considered low risk for pelvic cancer and who do not have symptoms. Ask your health care provider if a screening pelvic exam is right for you.  If you have had past treatment for cervical cancer or a condition that could lead to cancer, you need Pap tests and screening for cancer for at least 20 years after your treatment. If Pap tests have been discontinued, your risk factors (such as having a new sexual partner) need to be reassessed to determine if screening should resume. Some women have medical problems that increase the chance of getting cervical cancer. In these cases, your health care provider may recommend more frequent screening and Pap tests. Colorectal Cancer  This type of cancer can be detected and often prevented.  Routine colorectal cancer screening usually begins at 80 years of age and continues through 80  years of age.  Your health care provider may recommend screening at an earlier age if you have risk factors for colon cancer.  Your health care provider may also recommend using home test kits to check for hidden blood in the stool.  A small camera at the end of a tube can be used to examine your colon directly (sigmoidoscopy or colonoscopy). This is done to check for the earliest forms of colorectal cancer.  Routine screening usually begins at age 20.  Direct examination of the colon should be repeated every 5-10 years through 80 years of age. However, you may need to be screened more often if early forms of precancerous polyps or small growths are found. Skin Cancer  Check your skin from head to toe regularly.  Tell your health care provider about any new moles or changes in moles, especially if there is a change in a mole's shape or color.  Also tell your health care provider if you have a mole that is larger than the size of a  pencil eraser.  Always use sunscreen. Apply sunscreen liberally and repeatedly throughout the day.  Protect yourself by wearing long sleeves, pants, a wide-brimmed hat, and sunglasses whenever you are outside. HEART DISEASE, DIABETES, AND HIGH BLOOD PRESSURE   High blood pressure causes heart disease and increases the risk of stroke. High blood pressure is more likely to develop in:  People who have blood pressure in the high end of the normal range (130-139/85-89 mm Hg).  People who are overweight or obese.  People who are African American.  If you are 71-82 years of age, have your blood pressure checked every 3-5 years. If you are 30 years of age or older, have your blood pressure checked every year. You should have your blood pressure measured twice--once when you are at a hospital or clinic, and once when you are not at a hospital or clinic. Record the average of the two measurements. To check your blood pressure when you are not at a hospital or clinic, you can use:  An automated blood pressure machine at a pharmacy.  A home blood pressure monitor.  If you are between 63 years and 17 years old, ask your health care provider if you should take aspirin to prevent strokes.  Have regular diabetes screenings. This involves taking a blood sample to check your fasting blood sugar level.  If you are at a normal weight and have a low risk for diabetes, have this test once every three years after 80 years of age.  If you are overweight and have a high risk for diabetes, consider being tested at a younger age or more often. PREVENTING INFECTION  Hepatitis B  If you have a higher risk for hepatitis B, you should be screened for this virus. You are considered at high risk for hepatitis B if:  You were born in a country where hepatitis B is common. Ask your health care provider which countries are considered high risk.  Your parents were born in a high-risk country, and you have not been  immunized against hepatitis B (hepatitis B vaccine).  You have HIV or AIDS.  You use needles to inject street drugs.  You live with someone who has hepatitis B.  You have had sex with someone who has hepatitis B.  You get hemodialysis treatment.  You take certain medicines for conditions, including cancer, organ transplantation, and autoimmune conditions. Hepatitis C  Blood testing is recommended for:  Everyone born  from [place] through 1965.  Anyone with known risk factors for hepatitis C. Sexually transmitted infections (STIs)  You should be screened for sexually transmitted infections (STIs) including gonorrhea and chlamydia if:  You are sexually active and are younger than 80 years of age.  You are older than 80 years of age and your health care provider tells you that you are at risk for this type of infection.  Your sexual activity has changed since you were last screened and you are at an increased risk for chlamydia or gonorrhea. Ask your health care provider if you are at risk.  If you do not have HIV, but are at risk, it may be recommended that you take a prescription medicine daily to prevent HIV infection. This is called pre-exposure prophylaxis (PrEP). You are considered at risk if:  You are sexually active and do not regularly use condoms or know the HIV status of your partner(s).  You take drugs by injection.  You are sexually active with a partner who has HIV. Talk with your health care provider about whether you are at high risk of being infected with HIV. If you choose to begin PrEP, you should first be tested for HIV. You should then be tested every 3 months for as long as you are taking PrEP.  PREGNANCY   If you are premenopausal and you may become pregnant, ask your health care provider about preconception counseling.  If you may become pregnant, take 400 to 800 micrograms (mcg) of folic acid every day.  If you want to prevent pregnancy, talk to your  health care provider about birth control (contraception). OSTEOPOROSIS AND MENOPAUSE   Osteoporosis is a disease in which the bones lose minerals and strength with aging. This can result in serious bone fractures. Your risk for osteoporosis can be identified using a bone density scan.  If you are 43 years of age or older, or if you are at risk for osteoporosis and fractures, ask your health care provider if you should be screened.  Ask your health care provider whether you should take a calcium or vitamin D supplement to lower your risk for osteoporosis.  Menopause may have certain physical symptoms and risks.  Hormone replacement therapy may reduce some of these symptoms and risks. Talk to your health care provider about whether hormone replacement therapy is right for you.  HOME CARE INSTRUCTIONS   Schedule regular health, dental, and eye exams.  Stay current with your immunizations.   Do not use any tobacco products including cigarettes, chewing tobacco, or electronic cigarettes.  If you are pregnant, do not drink alcohol.  If you are breastfeeding, limit how much and how often you drink alcohol.  Limit alcohol intake to no more than 1 drink per day for nonpregnant women. One drink equals 12 ounces of beer, 5 ounces of wine, or 1 ounces of hard liquor.  Do not use street drugs.  Do not share needles.  Ask your health care provider for help if you need support or information about quitting drugs.  Tell your health care provider if you often feel depressed.  Tell your health care provider if you have ever been abused or do not feel safe at home.   This information is not intended to replace advice given to you by your health care provider. Make sure you discuss any questions you have with your health care provider.   Document Released: 02/13/2011 Document Revised: 08/21/2014 Document Reviewed: 07/02/2013 Elsevier Interactive Patient Education 2016  [employer]   Overactive Bladder, Adult Overactive bladder is a group of urinary symptoms. With overactive bladder, you may suddenly feel the need to pass urine (urinate) right away. After feeling this sudden urge, you might also leak urine if you cannot get to the bathroom fast enough (urinary incontinence). These symptoms might interfere with your daily work or social activities. Overactive bladder symptoms may also wake you up at night. Overactive bladder affects the nerve signals between your bladder and your brain. Your bladder may get the signal to empty before it is full. Very sensitive muscles can also make your bladder squeeze too soon. CAUSES Many things can cause an overactive bladder. Possible causes include:  Urinary tract infection.  Infection of nearby tissues, such as the prostate.  Prostate enlargement.  Being pregnant with twins or more (multiples).  Surgery on the uterus or urethra.  Bladder stones, inflammation, or tumors.  Drinking too much caffeine or alcohol.  Certain medicines, especially those that you take to help your body get rid of extra fluid (diuretics) by increasing urine production.  Muscle or nerve weakness, especially from:  A spinal cord injury.  Stroke.  Multiple sclerosis.  Parkinson disease.  Diabetes. This can cause a high urine volume that fills the bladder so quickly that the normal urge to urinate is triggered very strongly.  Constipation. A buildup of too much stool can put pressure on your bladder. RISK FACTORS You may be at greater risk for overactive bladder if you:  Are an older adult.  Smoke.  Are going through menopause.  Have prostate problems.  Have a neurological disease, such as stroke, dementia, Parkinson disease, or multiple sclerosis (MS).  Eat or drink things that irritate the bladder. These include alcohol, spicy food, and caffeine.  Are overweight or obese. SIGNS AND SYMPTOMS  The signs and symptoms of an  overactive bladder include:  Sudden, strong urges to urinate.  Leaking urine.  Urinating eight or more times per day.  Waking up to urinate two or more times per night. DIAGNOSIS Your health care provider may suspect overactive bladder based on your symptoms. The health care provider will do a physical exam and take your medical history. Blood or urine tests may also be done. For example, you might need to have a bladder function test to check how well you can hold your urine. You might also need to see a health care provider who specializes in the urinary tract (urologist). TREATMENT Treatment for overactive bladder depends on the cause of your condition and whether it is mild or severe. Certain treatments can be done in your health care provider's office or clinic. You can also make lifestyle changes at home. Options include: Behavioral Treatments  Biofeedback. A specialist uses sensors to help you become aware of your body's signals.  Keeping a daily log of when you need to urinate and what happens after the urge. This may help you manage your condition.  Bladder training. This helps you learn to control the urge to urinate by following a schedule that directs you to urinate at regular intervals (timed voiding). At first, you might have to wait a few minutes after feeling the urge. In time, you should be able to schedule bathroom visits an hour or more apart.  Kegel exercises. These are exercises to strengthen the pelvic floor muscles, which support the bladder. Toning these muscles can help you control urination, even if your bladder muscles are overactive. A specialist will teach you how to  do these exercises correctly. They require daily practice.  Weight loss. If you are obese or overweight, losing weight might relieve your symptoms of overactive bladder. Talk to your health care provider about losing weight and whether there is a specific program or method that would work best for  you.  Diet change. This might help if constipation is making your overactive bladder worse. Your health care provider or a dietitian can explain ways to change what you eat to ease constipation. You might also need to consume less alcohol and caffeine or drink other fluids at different times of the day.  Stopping smoking.  Wearing pads to absorb leakage while you wait for other treatments to take effect. Physical Treatments  Electrical stimulation. Electrodes send gentle pulses of electricity to strengthen the nerves or muscles that help to control the bladder. Sometimes, the electrodes are placed outside of the body. In other cases, they might be placed inside the body (implanted). This treatment can take several months to have an effect.  Supportive devices. Women may need a plastic device that fits into the vagina and supports the bladder (pessary). Medicines Several medicines can help treat overactive bladder and are usually used along with other treatments. Some are injected into the muscles involved in urination. Others come in pill form. Your health care provider may prescribe:  Antispasmodics. These medicines block the signals that the nerves send to the bladder. This keeps the bladder from releasing urine at the wrong time.  Tricyclic antidepressants. These types of antidepressants also relax bladder muscles. Surgery  You may have a device implanted to help manage the nerve signals that indicate when you need to urinate.  You may have surgery to implant electrodes for electrical stimulation.  Sometimes, very severe cases of overactive bladder require surgery to change the shape of the bladder. HOME CARE INSTRUCTIONS   Take medicines only as directed by your health care provider.  Use any implants or a pessary as directed by your health care provider.  Make any diet or lifestyle changes that are recommended by your health care provider. These might include:  Drinking less  fluid or drinking at different times of the day. If you need to urinate often during the night, you may need to stop drinking fluids early in the evening.  Cutting down on caffeine or alcohol. Both can make an overactive bladder worse. Caffeine is found in coffee, tea, and sodas.  Doing Kegel exercises to strengthen muscles.  Losing weight if you need to.  Eating a healthy and balanced diet to prevent constipation.  Keep a journal or log to track how much and when you drink and also when you feel the need to urinate. This will help your health care provider to monitor your condition. SEEK MEDICAL CARE IF:  Your symptoms do not get better after treatment.  Your pain and discomfort are getting worse.  You have more frequent urges to urinate.  You have a fever. SEEK IMMEDIATE MEDICAL CARE IF: You are not able to control your bladder at all.   This information is not intended to replace advice given to you by your health care provider. Make sure you discuss any questions you have with your health care provider.   Document Released: 05/27/2009 Document Revised: 08/21/2014 Document Reviewed: 12/24/2013 Elsevier Interactive Patient Education 2016 [employer].  Kegel Exercises The goal of Kegel exercises is to isolate and exercise your pelvic floor muscles. These muscles act as a hammock that supports the rectum, vagina,  small intestine, and uterus. As the muscles weaken, the hammock sags and these organs are displaced from their normal positions. Kegel exercises can strengthen your pelvic floor muscles and help you to improve bladder and bowel control, improve sexual response, and help reduce many problems and some discomfort during pregnancy. Kegel exercises can be done anywhere and at any time. HOW TO PERFORM KEGEL EXERCISES 1. Locate your pelvic floor muscles. To do this, squeeze (contract) the muscles that you use when you try to stop the flow of urine. You will feel a tightness in the  vaginal area (women) and a tight lift in the rectal area (men and women). 2. When you begin, contract your pelvic muscles tight for 2-5 seconds, then relax them for 2-5 seconds. This is one set. Do 4-5 sets with a short pause in between. 3. Contract your pelvic muscles for 8-10 seconds, then relax them for 8-10 seconds. Do 4-5 sets. If you cannot contract your pelvic muscles for 8-10 seconds, try 5-7 seconds and work your way up to 8-10 seconds. Your goal is 4-5 sets of 10 contractions each day. Keep your stomach, buttocks, and legs relaxed during the exercises. Perform sets of both short and long contractions. Vary your positions. Perform these contractions 3-4 times per day. Perform sets while you are:   Lying in bed in the morning.  Standing at lunch.  Sitting in the late afternoon.  Lying in bed at night. You should do 40-50 contractions per day. Do not perform more Kegel exercises per day than recommended. Overexercising can cause muscle fatigue. Continue these exercises for for at least 15-20 weeks or as directed by your caregiver.   This information is not intended to replace advice given to you by your health care provider. Make sure you discuss any questions you have with your health care provider.   Document Released: 07/17/2012 Document Revised: 08/21/2014 Document Reviewed: 07/17/2012 Elsevier Interactive Patient Education [employer].

## 2016-03-10 ENCOUNTER — Other Ambulatory Visit: Payer: Self-pay | Admitting: Family Medicine

## 2016-03-17 DIAGNOSIS — M1611 Unilateral primary osteoarthritis, right hip: Secondary | ICD-10-CM | POA: Diagnosis not present

## 2016-03-17 DIAGNOSIS — M7062 Trochanteric bursitis, left hip: Secondary | ICD-10-CM | POA: Diagnosis not present

## 2016-03-17 DIAGNOSIS — M7061 Trochanteric bursitis, right hip: Secondary | ICD-10-CM | POA: Diagnosis not present

## 2016-03-17 DIAGNOSIS — M1612 Unilateral primary osteoarthritis, left hip: Secondary | ICD-10-CM | POA: Diagnosis not present

## 2016-04-05 ENCOUNTER — Encounter: Payer: Medicare Other | Admitting: *Deleted

## 2016-04-05 DIAGNOSIS — Z1231 Encounter for screening mammogram for malignant neoplasm of breast: Secondary | ICD-10-CM | POA: Diagnosis not present

## 2016-05-08 ENCOUNTER — Ambulatory Visit (INDEPENDENT_AMBULATORY_CARE_PROVIDER_SITE_OTHER): Payer: Medicare Other

## 2016-05-08 DIAGNOSIS — Z23 Encounter for immunization: Secondary | ICD-10-CM | POA: Diagnosis not present

## 2016-05-31 DIAGNOSIS — M1612 Unilateral primary osteoarthritis, left hip: Secondary | ICD-10-CM | POA: Diagnosis not present

## 2016-05-31 DIAGNOSIS — M7061 Trochanteric bursitis, right hip: Secondary | ICD-10-CM | POA: Diagnosis not present

## 2016-05-31 DIAGNOSIS — M7062 Trochanteric bursitis, left hip: Secondary | ICD-10-CM | POA: Diagnosis not present

## 2016-05-31 DIAGNOSIS — M1611 Unilateral primary osteoarthritis, right hip: Secondary | ICD-10-CM | POA: Diagnosis not present

## 2016-06-07 ENCOUNTER — Other Ambulatory Visit (INDEPENDENT_AMBULATORY_CARE_PROVIDER_SITE_OTHER): Payer: Medicare Other

## 2016-06-07 DIAGNOSIS — L820 Inflamed seborrheic keratosis: Secondary | ICD-10-CM | POA: Diagnosis not present

## 2016-06-07 DIAGNOSIS — E7800 Pure hypercholesterolemia, unspecified: Secondary | ICD-10-CM

## 2016-06-07 DIAGNOSIS — L821 Other seborrheic keratosis: Secondary | ICD-10-CM | POA: Diagnosis not present

## 2016-06-07 DIAGNOSIS — I10 Essential (primary) hypertension: Secondary | ICD-10-CM | POA: Diagnosis not present

## 2016-06-07 DIAGNOSIS — E559 Vitamin D deficiency, unspecified: Secondary | ICD-10-CM | POA: Diagnosis not present

## 2016-06-07 DIAGNOSIS — L814 Other melanin hyperpigmentation: Secondary | ICD-10-CM | POA: Diagnosis not present

## 2016-06-07 DIAGNOSIS — R7303 Prediabetes: Secondary | ICD-10-CM

## 2016-06-07 DIAGNOSIS — K219 Gastro-esophageal reflux disease without esophagitis: Secondary | ICD-10-CM | POA: Diagnosis not present

## 2016-06-08 LAB — CBC WITH DIFFERENTIAL/PLATELET
BASOS: 0 %
Basophils Absolute: 0 10*3/uL (ref 0.0–0.2)
EOS (ABSOLUTE): 0.2 10*3/uL (ref 0.0–0.4)
EOS: 2 %
HEMATOCRIT: 39.5 % (ref 34.0–46.6)
HEMOGLOBIN: 13.6 g/dL (ref 11.1–15.9)
IMMATURE GRANS (ABS): 0.1 10*3/uL (ref 0.0–0.1)
IMMATURE GRANULOCYTES: 1 %
LYMPHS: 25 %
Lymphocytes Absolute: 2.2 10*3/uL (ref 0.7–3.1)
MCH: 30.7 pg (ref 26.6–33.0)
MCHC: 34.4 g/dL (ref 31.5–35.7)
MCV: 89 fL (ref 79–97)
MONOCYTES: 9 %
Monocytes Absolute: 0.8 10*3/uL (ref 0.1–0.9)
NEUTROS PCT: 63 %
Neutrophils Absolute: 5.5 10*3/uL (ref 1.4–7.0)
PLATELETS: 242 10*3/uL (ref 150–379)
RBC: 4.43 x10E6/uL (ref 3.77–5.28)
RDW: 12.8 % (ref 12.3–15.4)
WBC: 8.7 10*3/uL (ref 3.4–10.8)

## 2016-06-08 LAB — LIPID PANEL
CHOL/HDL RATIO: 3.2 ratio (ref 0.0–4.4)
Cholesterol, Total: 174 mg/dL (ref 100–199)
HDL: 55 mg/dL (ref >39)
LDL Calculated: 73 mg/dL (ref 0–99)
Triglycerides: 230 mg/dL — ABNORMAL HIGH (ref 0–149)
VLDL Cholesterol Cal: 46 mg/dL — ABNORMAL HIGH (ref 5–40)

## 2016-06-08 LAB — HEPATIC FUNCTION PANEL
ALK PHOS: 96 IU/L (ref 39–117)
ALT: 21 IU/L (ref 0–32)
AST: 21 IU/L (ref 0–40)
Albumin: 4.7 g/dL (ref 3.5–4.7)
BILIRUBIN, DIRECT: 0.08 mg/dL (ref 0.00–0.40)
Bilirubin Total: 0.4 mg/dL (ref 0.0–1.2)
Total Protein: 7.3 g/dL (ref 6.0–8.5)

## 2016-06-08 LAB — BMP8+EGFR
BUN/Creatinine Ratio: 29 — ABNORMAL HIGH (ref 12–28)
BUN: 18 mg/dL (ref 8–27)
CALCIUM: 9.5 mg/dL (ref 8.7–10.3)
CO2: 24 mmol/L (ref 18–29)
CREATININE: 0.63 mg/dL (ref 0.57–1.00)
Chloride: 99 mmol/L (ref 96–106)
GFR, EST AFRICAN AMERICAN: 98 mL/min/{1.73_m2} (ref >59)
GFR, EST NON AFRICAN AMERICAN: 85 mL/min/{1.73_m2} (ref >59)
Glucose: 88 mg/dL (ref 65–99)
Potassium: 4.4 mmol/L (ref 3.5–5.2)
Sodium: 142 mmol/L (ref 134–144)

## 2016-06-08 LAB — VITAMIN D 25 HYDROXY (VIT D DEFICIENCY, FRACTURES): Vit D, 25-Hydroxy: 48.3 ng/mL (ref 30.0–100.0)

## 2016-06-14 ENCOUNTER — Encounter: Payer: Self-pay | Admitting: Family Medicine

## 2016-06-14 ENCOUNTER — Ambulatory Visit (INDEPENDENT_AMBULATORY_CARE_PROVIDER_SITE_OTHER): Payer: Medicare Other | Admitting: Family Medicine

## 2016-06-14 VITALS — BP 117/68 | HR 62 | Temp 97.5°F | Ht 60.0 in | Wt 140.0 lb

## 2016-06-14 DIAGNOSIS — K219 Gastro-esophageal reflux disease without esophagitis: Secondary | ICD-10-CM

## 2016-06-14 DIAGNOSIS — I341 Nonrheumatic mitral (valve) prolapse: Secondary | ICD-10-CM

## 2016-06-14 DIAGNOSIS — I779 Disorder of arteries and arterioles, unspecified: Secondary | ICD-10-CM | POA: Diagnosis not present

## 2016-06-14 DIAGNOSIS — E559 Vitamin D deficiency, unspecified: Secondary | ICD-10-CM | POA: Diagnosis not present

## 2016-06-14 DIAGNOSIS — R7303 Prediabetes: Secondary | ICD-10-CM | POA: Diagnosis not present

## 2016-06-14 DIAGNOSIS — E7800 Pure hypercholesterolemia, unspecified: Secondary | ICD-10-CM | POA: Diagnosis not present

## 2016-06-14 DIAGNOSIS — I10 Essential (primary) hypertension: Secondary | ICD-10-CM

## 2016-06-14 DIAGNOSIS — I739 Peripheral vascular disease, unspecified: Secondary | ICD-10-CM

## 2016-06-14 DIAGNOSIS — I6523 Occlusion and stenosis of bilateral carotid arteries: Secondary | ICD-10-CM

## 2016-06-14 MED ORDER — TRIAMCINOLONE ACETONIDE 0.1 % EX CREA: 1.0000 "application " | TOPICAL_CREAM | Freq: Two times a day (BID) | CUTANEOUS | 1 refills | Status: DC

## 2016-06-14 NOTE — Progress Notes (Signed)
Subjective:    Patient ID: Melissa Carpenter, female    DOB: 07-31-1934, 82 y.o.   MRN: [id number]  HPI Pt here for follow up and management of chronic medical problems which includes hyperlipidemia, hypertension and GERD. She is taking medications regularly.The patient has had lab work and this will be reviewed with her during the visit today. All lab work was good including the blood sugar and renal function. The CBC was within normal limits. Liver function tests were normal. The vitamin D level was stable. Cholesterol numbers were good except the triglycerides were elevated at 230. The patient brings in blood sugars for review and all of these are generally good. There are a few a couple of hours after eating that are elevated. Please blood sugars will be scanned into the record. She also has checked some blood pressures and the majority of these are good also. The patient has had carotid Dopplers and we will review these and give her copies of these reports. She denies any chest pain or shortness of breath. She denies any problems with weakness on one side or the other. She has no problems with heartburn indigestion nausea vomiting diarrhea or blood in the stool. She does have constipation. Follow were talking about this we instructed her to take MiraLAX daily and to drink more fluids. She says she is drinking lots of fluids already. Chest the past 2-3 days her incontinence has gotten a lot worse and so we will also check a urine specimen. Along with this is had increased frequency and urgency.    Patient Active Problem List   Diagnosis Date Noted  . Pre-diabetes 02/01/2016  . Chronic venous insufficiency 09/14/2015  . Metabolic syndrome 45/62/5638  . Gastroesophageal reflux disease with hiatal hernia 07/21/2013  . Osteoporosis 03/05/2013  . Tachycardia   . Edema   . Carotid artery disease ([employer])   . Dyslipidemia   . Mitral valve prolapse   . Ejection fraction   . Aortic insufficiency   .  Hypertension   . Chest pain    Outpatient Encounter Prescriptions as of 06/14/2016  Medication Sig  . amLODipine-atorvastatin (CADUET) 10-40 MG tablet TAKE 1 TABLET BY MOUTH DAILY.  Marland Kitchen aspirin 81 MG tablet Take 81 mg by mouth daily.  . blood glucose meter kit and supplies KIT Dispense based on patient and insurance preference. Use daily as directed.  . Calcium Carbonate-Vitamin D (CALTRATE 600+D) 600-400 MG-UNIT per tablet Take 1 tablet by mouth daily.  . cholecalciferol (VITAMIN D) 400 UNITS TABS Take 1,000 Units by mouth.    . fish oil-omega-3 fatty acids 1000 MG capsule Take 1 g by mouth daily.    . metoprolol succinate (TOPROL-XL) 100 MG 24 hr tablet TAKE 1 TABLET (100 MG TOTAL) BY MOUTH DAILY. TAKE WITH OR IMMEDIATELY FOLLOWING A MEAL.  . metoprolol tartrate (LOPRESSOR) 25 MG tablet Take 1 tablet (25 mg total) by mouth as needed.  . Multiple Vitamin (MULTIVITAMIN) tablet Take 1 tablet by mouth daily.    Marland Kitchen olmesartan (BENICAR) 40 MG tablet TAKE 1 TABLET BY MOUTH EVERY DAY  . pantoprazole (PROTONIX) 40 MG tablet TAKE 1 TABLET EVERY DAY  . [DISCONTINUED] pantoprazole (PROTONIX) 40 MG tablet Take 40 mg by mouth daily.   No facility-administered encounter medications on file as of 06/14/2016.       Review of Systems  Constitutional: Negative.   HENT: Negative.   Eyes: Negative.   Respiratory: Negative.   Cardiovascular: Negative.   Gastrointestinal: Positive for constipation.  Endocrine: Negative.   Genitourinary: Negative.        Trouble "holding her water"  Musculoskeletal: Negative.   Skin: Negative.   Allergic/Immunologic: Negative.   Neurological: Negative.   Hematological: Negative.   Psychiatric/Behavioral: Negative.        Objective:   Physical Exam  Constitutional: She is oriented to person, place, and time. She appears well-developed and well-nourished. No distress.  Pleasant and alert  HENT:  Head: Normocephalic and atraumatic.  Right Ear: External ear normal.    Left Ear: External ear normal.  Mouth/Throat: Oropharynx is clear and moist. No oropharyngeal exudate.  Lite nasal congestion bilaterally  Eyes: Conjunctivae and EOM are normal. Pupils are equal, round, and reactive to light. Right eye exhibits no discharge. Left eye exhibits no discharge. No scleral icterus.  Neck: Normal range of motion. Neck supple. No thyromegaly present.  Bilateral carotid bruits which may be related to her mitral valve insufficiency. A recent Doppler report indicated 1-39% blockages bilaterally.  Cardiovascular: Normal rate, regular rhythm and intact distal pulses.  Exam reveals no gallop and no friction rub.   Murmur heard. Heart is regular at 60/m with a grade 2/6 systolic ejection murmur  Pulmonary/Chest: Effort normal and breath sounds normal. No respiratory distress. She has no wheezes. She has no rales.  Clear anteriorly and posteriorly  Abdominal: Soft. Bowel sounds are normal. She exhibits no mass. There is no tenderness. There is no rebound and no guarding.  The patient has upper abdominal tenderness generally without masses or organ enlargement. There is also suprapubic tenderness.  Musculoskeletal: Normal range of motion. She exhibits no edema.  Lymphadenopathy:    She has no cervical adenopathy.  Neurological: She is alert and oriented to person, place, and time. She has normal reflexes. No cranial nerve deficit.  Skin: Skin is warm and dry. No rash noted.  Psychiatric: She has a normal mood and affect. Her behavior is normal. Judgment and thought content normal.  Nursing note and vitals reviewed.  BP 117/68 (BP Location: Left Arm)   Pulse 62   Temp 97.5 F (36.4 C) (Oral)   Ht 5' (1.524 m)   Wt 140 lb (63.5 kg)   BMI 27.34 kg/m    A urinalysis will be done today and the patient will be called with results as soon as those results become available     Assessment & Plan:  1. Pure hypercholesterolemia -Wynetta Emery all numbers were good other than the  triglycerides being elevated and the patient was instructed to continue her current medicine and try to do better with diet and exercise  2. Vitamin D deficiency -Continue current treatment as vitamin D level was good.  3. Essential hypertension -Blood pressure is good today and she will continue with current treatment  4. Gastroesophageal reflux disease, esophagitis presence not specified -Her biggest issue today is not with reflux but more with constipation. She will use MiraLAX as directed and keep drinking plenty of fluids.  5. Pre-diabetes -Overall her home blood sugar readings were good and her blood sugar reading with lab work was good. There were some elevated readings 2 hours after eating.  6. Mitral valve prolapse -Continue follow-up with cardiology  7. Carotid artery disease, unspecified laterality ([place]) -The most recent carotid Doppler showed 1-39% blockages bilaterally.  Patient Instructions                       Medicare Annual Wellness Visit  [facility] and the medical providers  at [facility] strive to bring you the best medical care.  In doing so we not only want to address your current medical conditions and concerns but also to detect new conditions early and prevent illness, disease and health-related problems.    Medicare offers a yearly Wellness Visit which allows our clinical staff to assess your need for preventative services including immunizations, lifestyle education, counseling to decrease risk of preventable diseases and screening for fall risk and other medical concerns.    This visit is provided free of charge (no copay) for all Medicare recipients. The clinical pharmacists at [facility] have begun to conduct these Wellness Visits which will also include a thorough review of all your medications.    As you primary medical provider recommend that you make an appointment for your Annual Wellness Visit if you  have not done so already this year.  You may set up this appointment before you leave today or you may call back [phone]) and schedule an appointment.  Please make sure when you call that you mention that you are scheduling your Annual Wellness Visit with the clinical pharmacist so that the appointment may be made for the proper length of time.     Continue current medications. Continue good therapeutic lifestyle changes which include good diet and exercise. Fall precautions discussed with patient. If an FOBT was given today- please return it to our front desk. If you are over 57 years old - you may need Prevnar 23 or the adult Pneumonia vaccine.  **Flu shots are available--- please call and schedule a FLU-CLINIC appointment**  After your visit with [place] today you will receive a survey in the mail or online from [employer] regarding your care with [place]. Please take a moment to fill this out. Your feedback is very important to [place] as you can help [place] better understand your patient needs as well as improve your experience and satisfaction. WE CARE ABOUT YOU!!!   Continue to follow-up aggressive therapeutic lifestyle changes which include diet and exercise We'll call with the urinalysis result as soon as that becomes available Continue to drink plenty of fluids and stay well hydrated Try MiraLAX for constipation     Arrie Senate MD

## 2016-06-14 NOTE — Patient Instructions (Addendum)
Medicare Annual Wellness Visit  [facility] and the medical providers at [facility] strive to bring you the best medical care.  In doing so we not only want to address your current medical conditions and concerns but also to detect new conditions early and prevent illness, disease and health-related problems.    Medicare offers a yearly Wellness Visit which allows our clinical staff to assess your need for preventative services including immunizations, lifestyle education, counseling to decrease risk of preventable diseases and screening for fall risk and other medical concerns.    This visit is provided free of charge (no copay) for all Medicare recipients. The clinical pharmacists at [facility] have begun to conduct these Wellness Visits which will also include a thorough review of all your medications.    As you primary medical provider recommend that you make an appointment for your Annual Wellness Visit if you have not done so already this year.  You may set up this appointment before you leave today or you may call back [phone] and schedule an appointment.  Please make sure when you call that you mention that you are scheduling your Annual Wellness Visit with the clinical pharmacist so that the appointment may be made for the proper length of time.     Continue current medications. Continue good therapeutic lifestyle changes which include good diet and exercise. Fall precautions discussed with patient. If an FOBT was given today- please return it to our front desk. If you are over 45 years old - you may need Prevnar 56 or the adult Pneumonia vaccine.  **Flu shots are available--- please call and schedule a FLU-CLINIC appointment**  After your visit with [place] today you will receive a survey in the mail or online from [employer] regarding your care with [place]. Please take a moment to fill this out. Your feedback is very  important to [place] as you can help [place] better understand your patient needs as well as improve your experience and satisfaction. WE CARE ABOUT YOU!!!   Continue to follow-up aggressive therapeutic lifestyle changes which include diet and exercise We'll call with the urinalysis result as soon as that becomes available Continue to drink plenty of fluids and stay well hydrated Try MiraLAX for constipation

## 2016-06-22 ENCOUNTER — Other Ambulatory Visit: Payer: Medicare Other

## 2016-06-22 DIAGNOSIS — K219 Gastro-esophageal reflux disease without esophagitis: Secondary | ICD-10-CM

## 2016-06-25 LAB — FECAL OCCULT BLOOD, IMMUNOCHEMICAL: FECAL OCCULT BLD: NEGATIVE

## 2016-07-13 ENCOUNTER — Encounter: Payer: Self-pay | Admitting: Family Medicine

## 2016-07-13 ENCOUNTER — Ambulatory Visit (INDEPENDENT_AMBULATORY_CARE_PROVIDER_SITE_OTHER): Payer: Medicare Other | Admitting: Family Medicine

## 2016-07-13 ENCOUNTER — Ambulatory Visit (INDEPENDENT_AMBULATORY_CARE_PROVIDER_SITE_OTHER): Payer: Medicare Other

## 2016-07-13 VITALS — BP 139/65 | HR 64 | Temp 97.6°F | Ht 60.0 in | Wt 141.0 lb

## 2016-07-13 DIAGNOSIS — I6523 Occlusion and stenosis of bilateral carotid arteries: Secondary | ICD-10-CM | POA: Diagnosis not present

## 2016-07-13 DIAGNOSIS — R51 Headache: Secondary | ICD-10-CM

## 2016-07-13 DIAGNOSIS — R413 Other amnesia: Secondary | ICD-10-CM

## 2016-07-13 DIAGNOSIS — R519 Headache, unspecified: Secondary | ICD-10-CM

## 2016-07-13 DIAGNOSIS — F419 Anxiety disorder, unspecified: Secondary | ICD-10-CM | POA: Diagnosis not present

## 2016-07-13 IMAGING — DX DG CERVICAL SPINE COMPLETE 4+V
6 series · 6 of 6 positions shown · non-contrast
Comparison: None.

CLINICAL DATA: Right side head weakness, occipital headache

EXAM:
CERVICAL SPINE - COMPLETE 4+ VIEW

[c-spine lat]
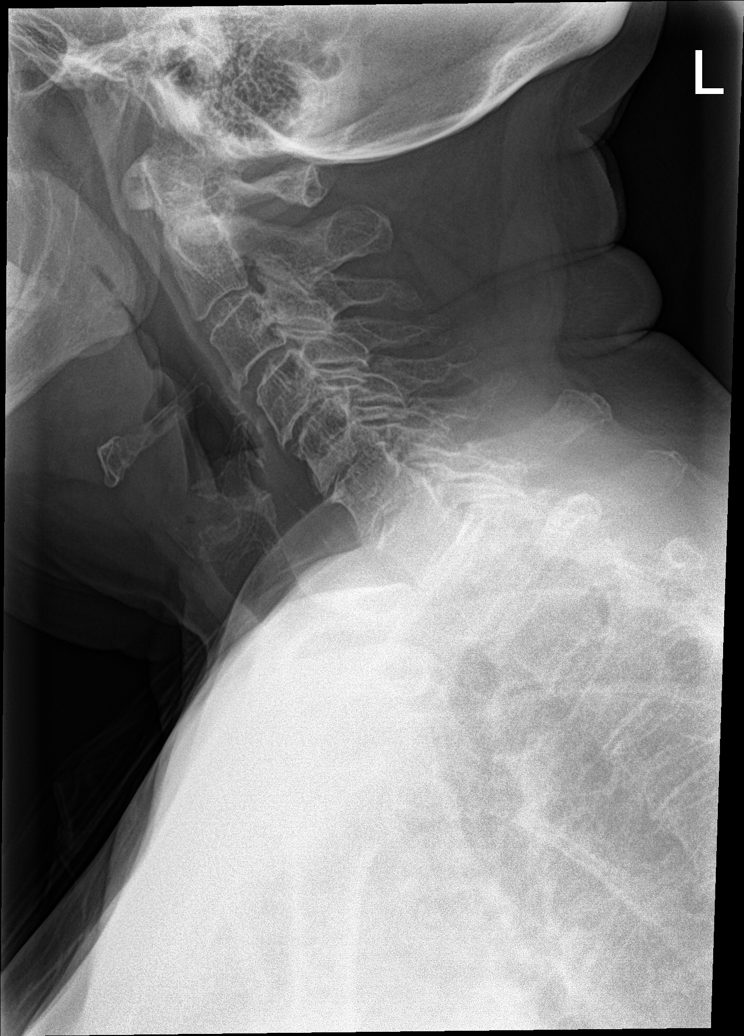

[c-spine obl (1 of 2)]
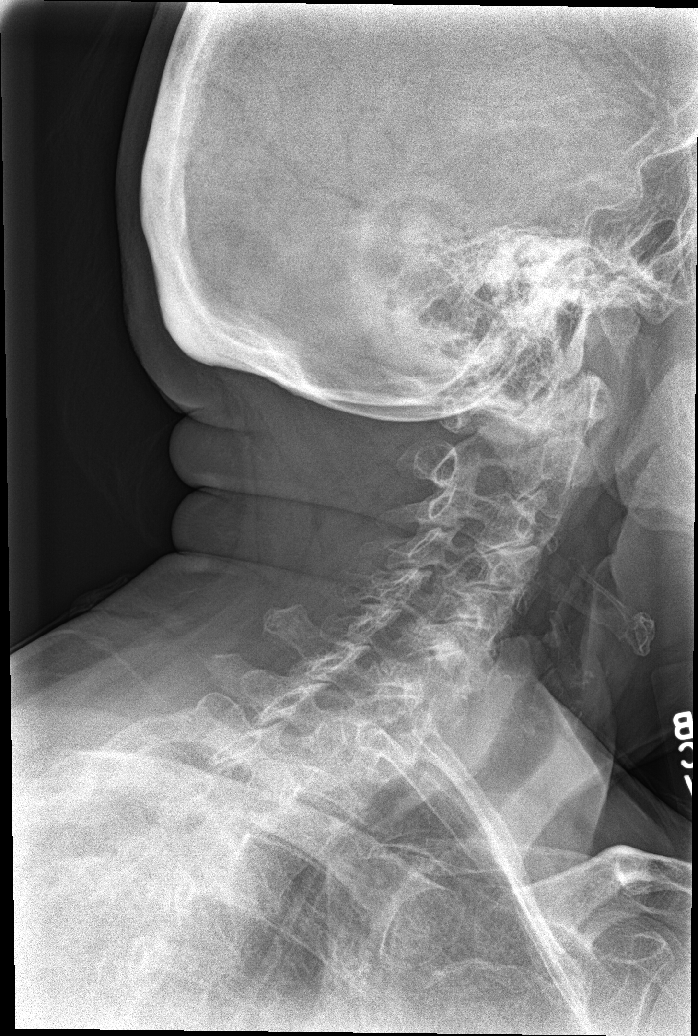

[c-spine obl (2 of 2)]
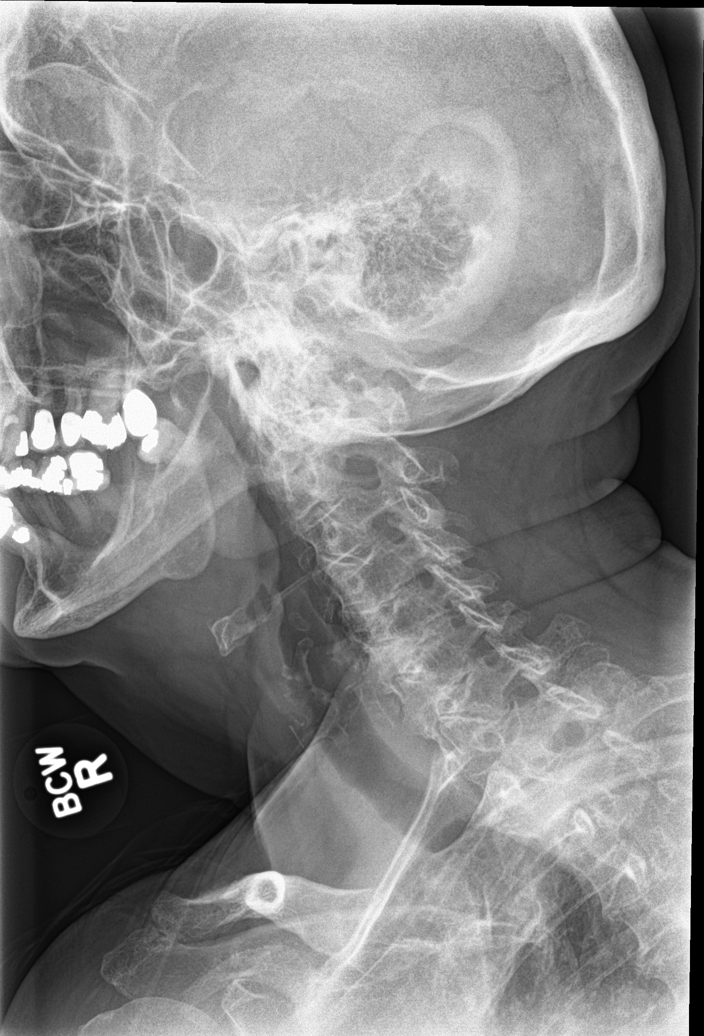

[c-spine ap (1 of 2)]
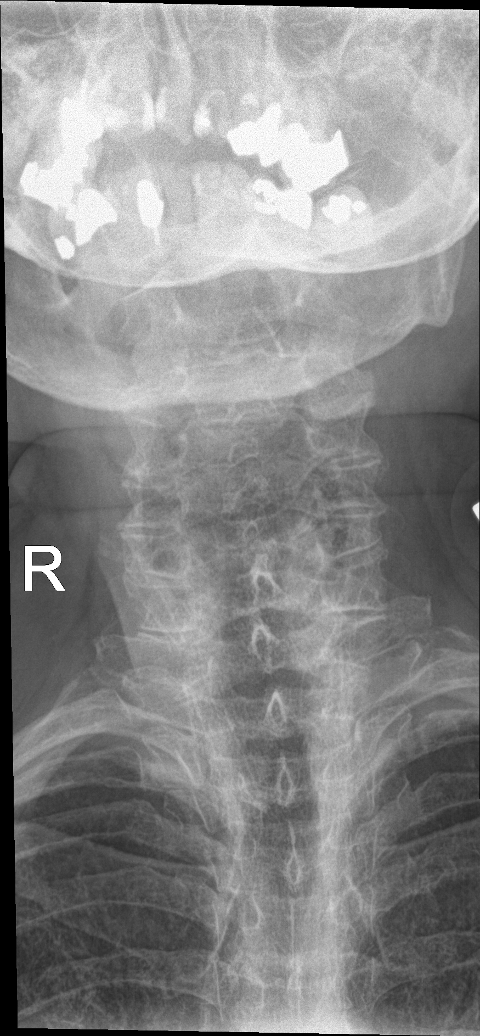

[c-spine open mouth]
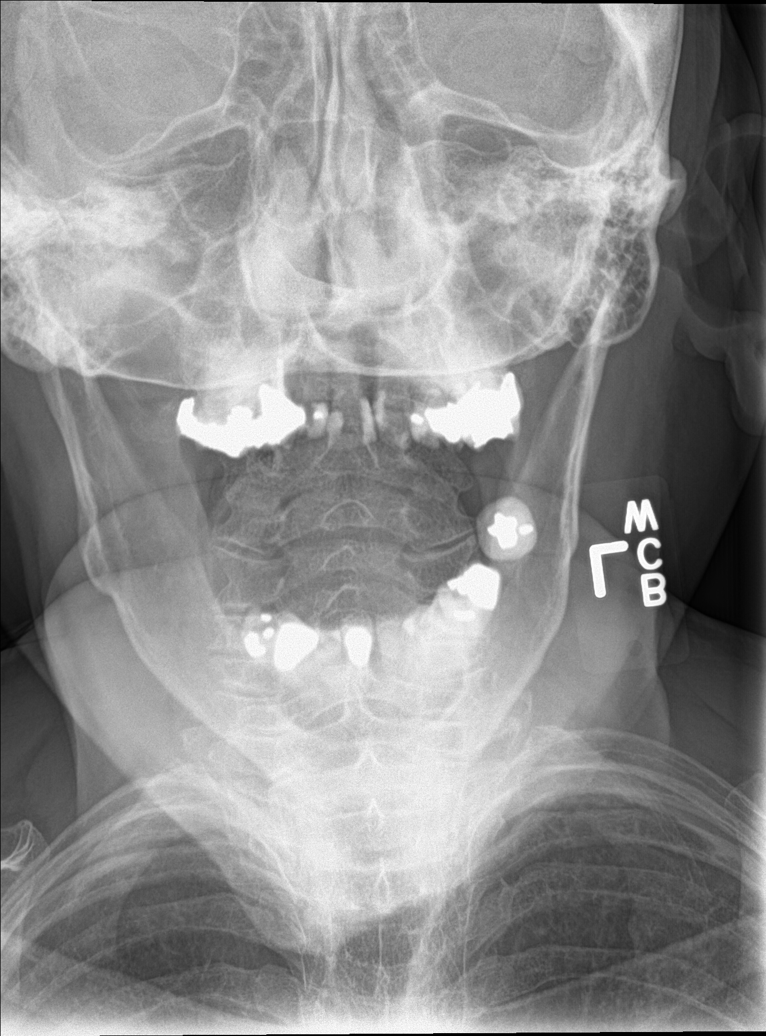

[c-spine ap (2 of 2)]
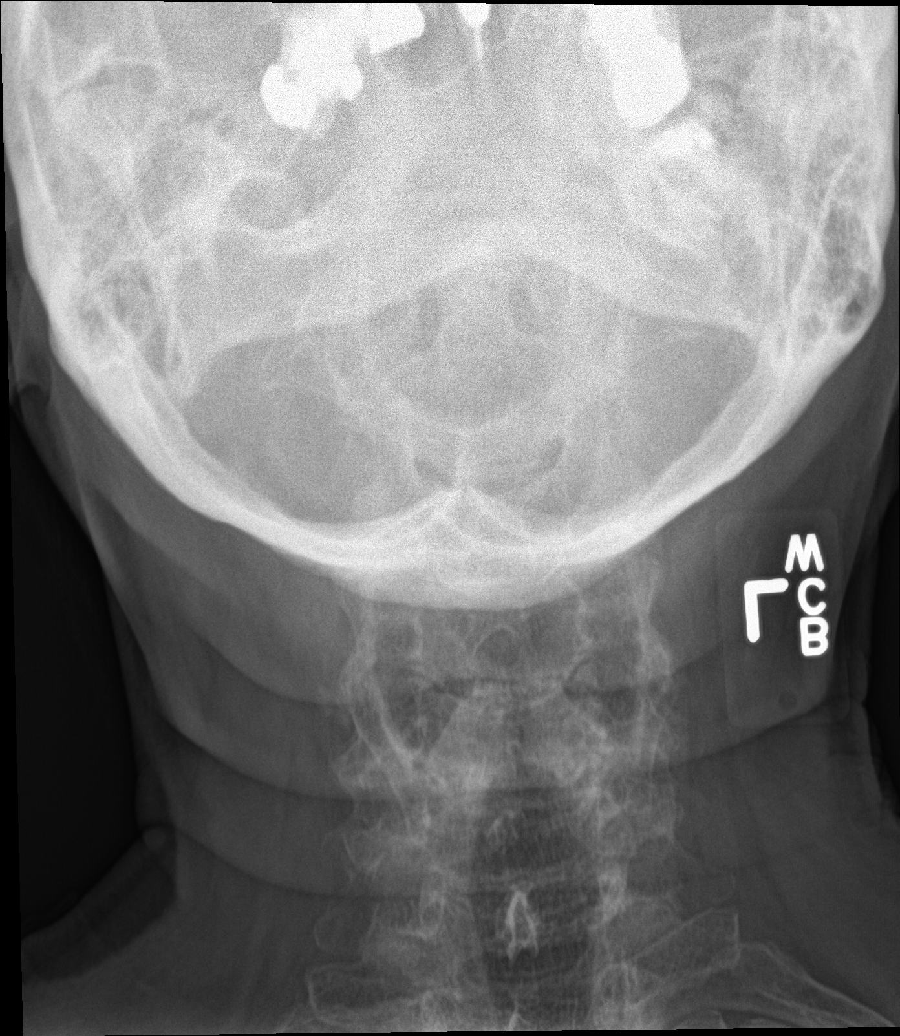

[6 of 6 positions shown; findings below may reference images not displayed]

FINDINGS: Six views of the cervical spine submitted. No acute fracture or
subluxation. Mild anterior spurring lower endplate of C3 and C4
vertebral body. Mild disc space flattening with anterior spurring at
C4-C5 level. Moderate disc space flattening with anterior spurring
at C4-C5 and C5-C6 level. No prevertebral soft tissue swelling.
Cervical airway is patent.
IMPRESSION: No acute fracture or subluxation. Degenerative changes as described
above.

## 2016-07-13 NOTE — Progress Notes (Signed)
Subjective:    Patient ID: Melissa Carpenter, female    DOB: July 09, 1934, 82 y.o.   MRN: [id number]  HPI Patient here today to discuss several issues. She has been having some right side headaches and numb sensations for over one year. She also recently experienced one episode of fever of 102.0.The patient does have a lot of stress in her life and looking after her husband. Patient denies any visual problems weakness on one side or the other. She just is concerned about memory recall a certain words at times. She is the kind of person that keeps everything to herself and she has dealt with stress of her mother-in-law and her husband. She does not want him to know about this. She does not have any motor weakness or sensation weakness other than this funny feeling on the right side of her scalp which only lasted for short period of time.    Patient Active Problem List   Diagnosis Date Noted  . Pre-diabetes 02/01/2016  . Chronic venous insufficiency 09/14/2015  . Metabolic syndrome 86/57/8469  . Gastroesophageal reflux disease with hiatal hernia 07/21/2013  . Osteoporosis 03/05/2013  . Tachycardia   . Edema   . Carotid artery disease ([place])   . Dyslipidemia   . Mitral valve prolapse   . Ejection fraction   . Aortic insufficiency   . Hypertension   . Chest pain    Outpatient Encounter Prescriptions as of 07/13/2016  Medication Sig  . amLODipine-atorvastatin (CADUET) 10-40 MG tablet TAKE 1 TABLET BY MOUTH DAILY.  Marland Kitchen aspirin 81 MG tablet Take 81 mg by mouth daily.  . blood glucose meter kit and supplies KIT Dispense based on patient and insurance preference. Use daily as directed.  . Calcium Carbonate-Vitamin D (CALTRATE 600+D) 600-400 MG-UNIT per tablet Take 1 tablet by mouth daily.  . cholecalciferol (VITAMIN D) 400 UNITS TABS Take 1,000 Units by mouth.    . fish oil-omega-3 fatty acids 1000 MG capsule Take 1 g by mouth daily.    . metoprolol succinate (TOPROL-XL) 100 MG 24 hr tablet TAKE 1  TABLET (100 MG TOTAL) BY MOUTH DAILY. TAKE WITH OR IMMEDIATELY FOLLOWING A MEAL.  . metoprolol tartrate (LOPRESSOR) 25 MG tablet Take 1 tablet (25 mg total) by mouth as needed.  . Multiple Vitamin (MULTIVITAMIN) tablet Take 1 tablet by mouth daily.    Marland Kitchen olmesartan (BENICAR) 40 MG tablet TAKE 1 TABLET BY MOUTH EVERY DAY  . pantoprazole (PROTONIX) 40 MG tablet TAKE 1 TABLET EVERY DAY  . triamcinolone cream (KENALOG) 0.1 % Apply 1 application topically 2 (two) times daily.   No facility-administered encounter medications on file as of 07/13/2016.         Review of Systems  Constitutional: Negative.   HENT: Negative.   Eyes: Negative.   Respiratory: Negative.   Cardiovascular: Negative.   Gastrointestinal: Negative.   Endocrine: Negative.   Genitourinary: Negative.   Musculoskeletal: Negative.   Skin: Negative.   Allergic/Immunologic: Negative.   Neurological: Positive for numbness (right side HA  - numbess occasionaly for about 1 year).       Trouble recalling memory / words  Hematological: Negative.   Psychiatric/Behavioral: Negative.        Objective:   Physical Exam  Constitutional: She is oriented to person, place, and time. She appears well-developed and well-nourished. No distress.  HENT:  Head: Normocephalic and atraumatic.  Eyes: Conjunctivae and EOM are normal. Pupils are equal, round, and reactive to light. Right eye exhibits no  discharge. Left eye exhibits no discharge. No scleral icterus.  Neck: Normal range of motion.  Musculoskeletal: Normal range of motion. She exhibits no edema.  There is tenderness on the occipital scalp more on the right than on the left especially on the tenderness in the occipital area. There is no rash.  Neurological: She is alert and oriented to person, place, and time.  Skin: Skin is warm and dry. No rash noted.  Psychiatric: She has a normal mood and affect. Her behavior is normal. Judgment and thought content normal.  Nursing note and  vitals reviewed.  BP 139/65 (BP Location: Left Arm)   Pulse 64   Temp 97.6 F (36.4 C) (Oral)   Ht 5' (1.524 m)   Wt 141 lb (64 kg)   BMI 27.54 kg/m   On the MMSE that was administered today she scored 30 out of 30. We had a long discussion regarding her symptoms and findings after questioning her about neurological issues. She admitted that she is under a lot of stress. She admitted that she does not want to do any extensive testing at this time. We will go ahead and get cervical spine films. We will go ahead and have her take an NSAID twice a day for a couple weeks protecting her stomach with an H2 blocker prior to taking the NSAID. She will call us back and let us know how she spots of that.      Assessment & Plan:  1. Memory impairment of gradual onset -Patient scored 30/30 on the MMSE and she was reassured about this. - DG Cervical Spine Complete; Future - Vitamin B12  2. Occipital headache -There is tenderness on the right side of the neck in the occipital area and we will have her use warm wet compresses and take an over-the-counter NSAID for a couple weeks to see if this helps this sensation in the parietal area. - DG Cervical Spine Complete; Future - Vitamin B12  3. Anxiety disorder, unspecified type -We'll try to reassure her that she is probably fine and there is most likely not a brain tumor of any kind. We however have recommended that if she does not get better that we should arrange for her to see the neurologist for additional testing and possibly an MRI. - Vitamin B12  Patient Instructions  We'll call with the results of the C-spine films as soon as they become available In the meantime she should use warm wet compresses to the neck 20 minutes 3 or 4 times daily For the next couple of weeks she should take Aleve 1 twice daily after breakfast and supper unless it bothers her stomach. To protect the stomach she should take ranitidine or Zantac 150 twice a day before  breakfast and supper. She should call us back in a couple weeks and let us know about her progress. She should try to make time for herself and make efforts to get out and relax and be away from all the stress that she is involved in. She did well with the MMSE and scored 30 out of 30. She should eat healthy and drink plenty of water and fluids.  Arrie Senate MD

## 2016-07-13 NOTE — Patient Instructions (Signed)
We'll call with the results of the C-spine films as soon as they become available In the meantime she should use warm wet compresses to the neck 20 minutes 3 or 4 times daily For the next couple of weeks she should take Aleve 1 twice daily after breakfast and supper unless it bothers her stomach. To protect the stomach she should take ranitidine or Zantac 150 twice a day before breakfast and supper. She should call us back in a couple weeks and let us know about her progress. She should try to make time for herself and make efforts to get out and relax and be away from all the stress that she is involved in. She did well with the MMSE and scored 30 out of 30. She should eat healthy and drink plenty of water and fluids.

## 2016-07-14 LAB — VITAMIN B12: VITAMIN B 12: 1510 pg/mL — AB (ref 211–946)

## 2016-07-28 ENCOUNTER — Other Ambulatory Visit: Payer: Self-pay | Admitting: Family Medicine

## 2016-09-02 ENCOUNTER — Other Ambulatory Visit: Payer: Self-pay | Admitting: Family Medicine

## 2016-09-03 ENCOUNTER — Other Ambulatory Visit: Payer: Self-pay | Admitting: Family Medicine

## 2016-09-25 DIAGNOSIS — M7061 Trochanteric bursitis, right hip: Secondary | ICD-10-CM | POA: Diagnosis not present

## 2016-09-25 DIAGNOSIS — M7062 Trochanteric bursitis, left hip: Secondary | ICD-10-CM | POA: Diagnosis not present

## 2016-09-25 DIAGNOSIS — M1612 Unilateral primary osteoarthritis, left hip: Secondary | ICD-10-CM | POA: Diagnosis not present

## 2016-09-25 DIAGNOSIS — M1611 Unilateral primary osteoarthritis, right hip: Secondary | ICD-10-CM | POA: Diagnosis not present

## 2016-09-28 DIAGNOSIS — M1611 Unilateral primary osteoarthritis, right hip: Secondary | ICD-10-CM | POA: Diagnosis not present

## 2016-09-29 ENCOUNTER — Ambulatory Visit (INDEPENDENT_AMBULATORY_CARE_PROVIDER_SITE_OTHER): Payer: Medicare Other | Admitting: Family Medicine

## 2016-09-29 VITALS — BP 138/64 | HR 66 | Temp 97.0°F | Ht 60.0 in | Wt 143.0 lb

## 2016-09-29 DIAGNOSIS — J209 Acute bronchitis, unspecified: Secondary | ICD-10-CM | POA: Diagnosis not present

## 2016-09-29 MED ORDER — AZITHROMYCIN 250 MG PO TABS: ORAL_TABLET | ORAL | 0 refills | Status: DC

## 2016-09-29 NOTE — Patient Instructions (Signed)
Great to see you!   Acute Bronchitis, Adult Acute bronchitis is when air tubes (bronchi) in the lungs suddenly get swollen. The condition can make it hard to breathe. It can also cause these symptoms:  A cough.  Coughing up clear, yellow, or green mucus.  Wheezing.  Chest congestion.  Shortness of breath.  A fever.  Body aches.  Chills.  A sore throat.  Follow these instructions at home: Medicines  Take over-the-counter and prescription medicines only as told by your doctor.  If you were prescribed an antibiotic medicine, take it as told by your doctor. Do not stop taking the antibiotic even if you start to feel better. General instructions  Rest.  Drink enough fluids to keep your pee (urine) clear or pale yellow.  Avoid smoking and secondhand smoke. If you smoke and you need help quitting, ask your doctor. Quitting will help your lungs heal faster.  Use an inhaler, cool mist vaporizer, or humidifier as told by your doctor.  Keep all follow-up visits as told by your doctor. This is important. How is this prevented? To lower your risk of getting this condition again:  Wash your hands often with soap and water. If you cannot use soap and water, use hand sanitizer.  Avoid contact with people who have cold symptoms.  Try not to touch your hands to your mouth, nose, or eyes.  Make sure to get the flu shot every year.  Contact a doctor if:  Your symptoms do not get better in 2 weeks. Get help right away if:  You cough up blood.  You have chest pain.  You have very bad shortness of breath.  You become dehydrated.  You faint (pass out) or keep feeling like you are going to pass out.  You keep throwing up (vomiting).  You have a very bad headache.  Your fever or chills gets worse. This information is not intended to replace advice given to you by your health care provider. Make sure you discuss any questions you have with your health care  provider. Document Released: 01/17/2008 Document Revised: 03/08/2016 Document Reviewed: 01/19/2016 Elsevier Interactive Patient Education  2017 [employer]  

## 2016-09-29 NOTE — Progress Notes (Signed)
   HPI  Patient presents today here with cough and cold.  Patient explains that over the last day she developed cough productive of yellow-green sputum.   Began with rhinorrhea of yellow-green sputum, then moved to her chest. She denies any shortness of breath, chills, body aches, fever, or chest pain.  She's tolerating food and fluids normally.  It has been more than 6 months and she used antibiotics, she usually does very well with azithromycin.  PMH: Smoking status noted ROS: Per HPI  Objective: BP 138/64 (BP Location: Right Arm)   Pulse 66   Temp 97 F (36.1 C) (Oral)   Ht 5' (1.524 m)   Wt 143 lb (64.9 kg)   BMI 27.93 kg/m  Gen: NAD, alert, cooperative with exam HEENT: NCAT, EOMI, PERRL CV: RRR, good S1/S2, no murmur Resp: Nonlabored, good air movement, right lower base with coarse breath sounds Ext: No edema, warm Neuro: Alert and oriented, No gross deficits  Assessment and plan:  # Acute bronchitis Treating more aggressively than usual given coarse breath sounds in left base with some concern about developing pneumonia She's tolerating food and fluids normally and has a reassuring clinical exam overall. Discussed usual course for illness and supportive care Return to clinic with any concerns.  Meds ordered this encounter  Medications  . azithromycin (ZITHROMAX) 250 MG tablet    Sig: Take 2 tablets on day 1 and 1 tablet daily after that    Dispense:  6 tablet    Refill:  0    Laroy Apple, MD [facility] 09/29/2016, 6:16 PM

## 2016-10-06 ENCOUNTER — Encounter: Payer: Self-pay | Admitting: Family Medicine

## 2016-10-06 ENCOUNTER — Ambulatory Visit (INDEPENDENT_AMBULATORY_CARE_PROVIDER_SITE_OTHER): Payer: Medicare Other | Admitting: Family Medicine

## 2016-10-06 VITALS — BP 139/63 | HR 84 | Temp 99.2°F | Resp 20 | Ht 60.0 in | Wt 141.8 lb

## 2016-10-06 DIAGNOSIS — J209 Acute bronchitis, unspecified: Secondary | ICD-10-CM | POA: Diagnosis not present

## 2016-10-06 DIAGNOSIS — R6889 Other general symptoms and signs: Secondary | ICD-10-CM | POA: Diagnosis not present

## 2016-10-06 LAB — VERITOR FLU A/B WAIVED
INFLUENZA A: NEGATIVE
Influenza B: NEGATIVE

## 2016-10-06 MED ORDER — DOXYCYCLINE HYCLATE 100 MG PO TABS: 100.0000 mg | ORAL_TABLET | Freq: Two times a day (BID) | ORAL | 0 refills | Status: DC

## 2016-10-06 NOTE — Progress Notes (Signed)
BP 139/63   Pulse 84   Temp 99.2 F (37.3 C) (Oral)   Resp 20   Ht 5' (1.524 m)   Wt 141 lb 12.8 oz (64.3 kg)   SpO2 98%   BMI 27.69 kg/m    Subjective:    Patient ID: Melissa Carpenter, female    DOB: 1934/08/05, 83 y.o.   MRN: [id number]  HPI: Melissa Carpenter is a 83 y.o. female presenting on 10/06/2016 for No chief complaint on file.   HPI Cough and congestion and fever Patient has been having cough and congestion and fever that started with cough and congestion a week ago and she was given azithromycin and had been improving until last night she started developing a fever and worsening cough and congestion and chest congestion. Her cough has been productive of yellow-green sputum overnight when it hadn't been as much before. She denies any shortness of breath or wheezing. She denies any sick contacts that she knows of or anywhere she would've gotten a new illness from. She had a fever of 100 at home and has a temperature of 99.2 here in the office. She has been using Mucinex and Tylenol which had been helping until last night when she got worse again.  Relevant past medical, surgical, family and social history reviewed and updated as indicated. Interim medical history since our last visit reviewed. Allergies and medications reviewed and updated.  Review of Systems  Constitutional: Positive for chills and fever.  HENT: Positive for congestion, postnasal drip, rhinorrhea, sinus pressure and sore throat. Negative for ear discharge, ear pain and sneezing.   Eyes: Negative for pain, redness and visual disturbance.  Respiratory: Positive for cough. Negative for chest tightness, shortness of breath and wheezing.   Cardiovascular: Negative for chest pain and leg swelling.  Genitourinary: Negative for difficulty urinating and dysuria.  Musculoskeletal: Positive for myalgias. Negative for back pain and gait problem.  Skin: Negative for rash.  Neurological: Negative for light-headedness and  headaches.  Psychiatric/Behavioral: Negative for agitation and behavioral problems.  All other systems reviewed and are negative.   Per HPI unless specifically indicated above   Allergies as of 10/06/2016      Reactions   Forteo [teriparatide (recombinant)] Other (See Comments)   Weakness and calcium increase   Codeine Hives   Evista [raloxifene] Other (See Comments)   Eye problems   Morphine Rash   Penicillins Rash   Sulfites Rash   Sulfonamide Derivatives Rash      Medication List       Accurate as of 10/06/16  6:38 PM. Always use your most recent med list.          amLODipine-atorvastatin 10-40 MG tablet Commonly known as:  CADUET TAKE 1 TABLET BY MOUTH DAILY.   aspirin 81 MG tablet Take 81 mg by mouth daily.   blood glucose meter kit and supplies Kit Dispense based on patient and insurance preference. Use daily as directed.   Calcium Carbonate-Vitamin D 600-400 MG-UNIT tablet Commonly known as:  CALTRATE 600+D Take 1 tablet by mouth daily.   cholecalciferol 400 units Tabs tablet Commonly known as:  VITAMIN D Take 1,000 Units by mouth.   doxycycline 100 MG tablet Commonly known as:  VIBRA-TABS Take 1 tablet (100 mg total) by mouth 2 (two) times daily. 1 po bid   fish oil-omega-3 fatty acids 1000 MG capsule Take 1 g by mouth daily.   metoprolol succinate 100 MG 24 hr tablet Commonly known as:  TOPROL-XL TAKE  1 TABLET (100 MG TOTAL) BY MOUTH DAILY. TAKE WITH OR IMMEDIATELY FOLLOWING A MEAL.   metoprolol tartrate 25 MG tablet Commonly known as:  LOPRESSOR Take 1 tablet (25 mg total) by mouth as needed.   multivitamin tablet Take 1 tablet by mouth daily.   olmesartan 40 MG tablet Commonly known as:  BENICAR TAKE 1 TABLET BY MOUTH EVERY DAY   pantoprazole 40 MG tablet Commonly known as:  PROTONIX TAKE 1 TABLET EVERY DAY   triamcinolone cream 0.1 % Commonly known as:  KENALOG Apply 1 application topically 2 (two) times daily.            Objective:    BP 139/63   Pulse 84   Temp 99.2 F (37.3 C) (Oral)   Resp 20   Ht 5' (1.524 m)   Wt 141 lb 12.8 oz (64.3 kg)   SpO2 98%   BMI 27.69 kg/m   Wt Readings from Last 3 Encounters:  10/06/16 141 lb 12.8 oz (64.3 kg)  09/29/16 143 lb (64.9 kg)  07/13/16 141 lb (64 kg)    Physical Exam  Constitutional: She is oriented to person, place, and time. She appears well-developed and well-nourished. No distress.  HENT:  Right Ear: Tympanic membrane, external ear and ear canal normal.  Left Ear: Tympanic membrane, external ear and ear canal normal.  Nose: Mucosal edema and rhinorrhea present. No epistaxis. Right sinus exhibits no maxillary sinus tenderness and no frontal sinus tenderness. Left sinus exhibits no maxillary sinus tenderness and no frontal sinus tenderness.  Mouth/Throat: Uvula is midline and mucous membranes are normal. Posterior oropharyngeal edema and posterior oropharyngeal erythema present. No oropharyngeal exudate or tonsillar abscesses.  Eyes: Conjunctivae and EOM are normal.  Cardiovascular: Normal rate, regular rhythm, normal heart sounds and intact distal pulses.   No murmur heard. Pulmonary/Chest: Effort normal and breath sounds normal. No respiratory distress. She has no decreased breath sounds. She has no wheezes. She has no rhonchi. She has no rales.  Musculoskeletal: Normal range of motion. She exhibits no edema or tenderness.  Lymphadenopathy:    She has no cervical adenopathy.  Neurological: She is alert and oriented to person, place, and time. Coordination normal.  Skin: Skin is warm and dry. No rash noted. She is not diaphoretic.  Psychiatric: She has a normal mood and affect. Her behavior is normal.  Vitals reviewed.   Results for orders placed or performed in visit on 10/06/16  Veritor Flu A/B Waived  Result Value Ref Range   Influenza A Negative Negative   Influenza B Negative Negative      Assessment & Plan:   Problem List Items  Addressed This Visit    None    Visit Diagnoses    Acute bronchitis, unspecified organism    -  Primary   Relevant Medications   doxycycline (VIBRA-TABS) 100 MG tablet   Other Relevant Orders   Veritor Flu A/B Waived (Completed)       Follow up plan: Return if symptoms worsen or fail to improve.  Counseling provided for all of the vaccine components Orders Placed This Encounter  Procedures  . Veritor Flu A/B [place], MD [facility] 10/06/2016, 6:38 PM

## 2016-10-17 ENCOUNTER — Other Ambulatory Visit: Payer: Self-pay | Admitting: Physician Assistant

## 2016-10-17 MED ORDER — LEVOFLOXACIN 500 MG PO TABS: 500.0000 mg | ORAL_TABLET | Freq: Every day | ORAL | 0 refills | Status: DC

## 2016-10-17 MED ORDER — PREDNISONE 10 MG (21) PO TBPK: ORAL_TABLET | ORAL | 0 refills | Status: DC

## 2016-10-17 MED ORDER — NYSTATIN 100000 UNIT/ML MT SUSP: 5.0000 mL | Freq: Four times a day (QID) | OROMUCOSAL | 0 refills | Status: DC

## 2016-10-26 DIAGNOSIS — M1612 Unilateral primary osteoarthritis, left hip: Secondary | ICD-10-CM | POA: Diagnosis not present

## 2016-10-27 ENCOUNTER — Other Ambulatory Visit (INDEPENDENT_AMBULATORY_CARE_PROVIDER_SITE_OTHER): Payer: Medicare Other

## 2016-10-27 DIAGNOSIS — E7800 Pure hypercholesterolemia, unspecified: Secondary | ICD-10-CM

## 2016-10-27 DIAGNOSIS — E559 Vitamin D deficiency, unspecified: Secondary | ICD-10-CM | POA: Diagnosis not present

## 2016-10-27 DIAGNOSIS — I10 Essential (primary) hypertension: Secondary | ICD-10-CM | POA: Diagnosis not present

## 2016-10-27 DIAGNOSIS — R7303 Prediabetes: Secondary | ICD-10-CM

## 2016-10-27 DIAGNOSIS — K219 Gastro-esophageal reflux disease without esophagitis: Secondary | ICD-10-CM

## 2016-10-28 LAB — BMP8+EGFR
BUN/Creatinine Ratio: 25 (ref 12–28)
BUN: 16 mg/dL (ref 8–27)
CALCIUM: 9.9 mg/dL (ref 8.7–10.3)
CO2: 23 mmol/L (ref 18–29)
Chloride: 101 mmol/L (ref 96–106)
Creatinine, Ser: 0.63 mg/dL (ref 0.57–1.00)
GFR, EST AFRICAN AMERICAN: 98 mL/min/{1.73_m2} (ref >59)
GFR, EST NON AFRICAN AMERICAN: 85 mL/min/{1.73_m2} (ref >59)
Glucose: 104 mg/dL — ABNORMAL HIGH (ref 65–99)
Potassium: 4.2 mmol/L (ref 3.5–5.2)
Sodium: 142 mmol/L (ref 134–144)

## 2016-10-28 LAB — HEPATIC FUNCTION PANEL
ALBUMIN: 4.2 g/dL (ref 3.5–4.7)
ALT: 22 IU/L (ref 0–32)
AST: 19 IU/L (ref 0–40)
Alkaline Phosphatase: 87 IU/L (ref 39–117)
Bilirubin Total: 0.2 mg/dL (ref 0.0–1.2)
Bilirubin, Direct: 0.06 mg/dL (ref 0.00–0.40)
Total Protein: 6.7 g/dL (ref 6.0–8.5)

## 2016-10-28 LAB — NMR, LIPOPROFILE
Cholesterol: 183 mg/dL (ref 100–199)
HDL Cholesterol by NMR: 57 mg/dL (ref >39)
HDL PARTICLE NUMBER: 37.7 umol/L (ref >=30.5)
LDL Particle Number: 1502 nmol/L — ABNORMAL HIGH (ref <1000)
LDL Size: 20.4 nm (ref >20.5)
LDL-C: 99 mg/dL (ref 0–99)
LP-IR SCORE: 82 — AB (ref <=45)
SMALL LDL PARTICLE NUMBER: 677 nmol/L — AB (ref <=527)
Triglycerides by NMR: 133 mg/dL (ref 0–149)

## 2016-10-28 LAB — CBC WITH DIFFERENTIAL/PLATELET
BASOS: 0 %
Basophils Absolute: 0 10*3/uL (ref 0.0–0.2)
EOS (ABSOLUTE): 0 10*3/uL (ref 0.0–0.4)
EOS: 0 %
HEMATOCRIT: 36.2 % (ref 34.0–46.6)
HEMOGLOBIN: 12.3 g/dL (ref 11.1–15.9)
IMMATURE GRANULOCYTES: 0 %
Immature Grans (Abs): 0 10*3/uL (ref 0.0–0.1)
Lymphocytes Absolute: 1.5 10*3/uL (ref 0.7–3.1)
Lymphs: 13 %
MCH: 30.2 pg (ref 26.6–33.0)
MCHC: 34 g/dL (ref 31.5–35.7)
MCV: 89 fL (ref 79–97)
MONOCYTES: 9 %
MONOS ABS: 1 10*3/uL — AB (ref 0.1–0.9)
NEUTROS PCT: 78 %
Neutrophils Absolute: 8.9 10*3/uL — ABNORMAL HIGH (ref 1.4–7.0)
Platelets: 265 10*3/uL (ref 150–379)
RBC: 4.07 x10E6/uL (ref 3.77–5.28)
RDW: 13.8 % (ref 12.3–15.4)
WBC: 11.5 10*3/uL — AB (ref 3.4–10.8)

## 2016-10-28 LAB — VITAMIN D 25 HYDROXY (VIT D DEFICIENCY, FRACTURES): VIT D 25 HYDROXY: 43.8 ng/mL (ref 30.0–100.0)

## 2016-11-03 ENCOUNTER — Ambulatory Visit (INDEPENDENT_AMBULATORY_CARE_PROVIDER_SITE_OTHER): Payer: Medicare Other | Admitting: Family Medicine

## 2016-11-03 ENCOUNTER — Encounter: Payer: Self-pay | Admitting: Family Medicine

## 2016-11-03 VITALS — BP 157/62 | HR 64 | Temp 97.3°F | Ht 60.0 in | Wt 142.0 lb

## 2016-11-03 DIAGNOSIS — R0981 Nasal congestion: Secondary | ICD-10-CM

## 2016-11-03 DIAGNOSIS — I10 Essential (primary) hypertension: Secondary | ICD-10-CM

## 2016-11-03 DIAGNOSIS — E559 Vitamin D deficiency, unspecified: Secondary | ICD-10-CM | POA: Diagnosis not present

## 2016-11-03 DIAGNOSIS — K219 Gastro-esophageal reflux disease without esophagitis: Secondary | ICD-10-CM

## 2016-11-03 DIAGNOSIS — H6121 Impacted cerumen, right ear: Secondary | ICD-10-CM

## 2016-11-03 DIAGNOSIS — I341 Nonrheumatic mitral (valve) prolapse: Secondary | ICD-10-CM

## 2016-11-03 DIAGNOSIS — I779 Disorder of arteries and arterioles, unspecified: Secondary | ICD-10-CM

## 2016-11-03 DIAGNOSIS — E7800 Pure hypercholesterolemia, unspecified: Secondary | ICD-10-CM | POA: Diagnosis not present

## 2016-11-03 DIAGNOSIS — I739 Peripheral vascular disease, unspecified: Secondary | ICD-10-CM

## 2016-11-03 DIAGNOSIS — R7303 Prediabetes: Secondary | ICD-10-CM

## 2016-11-03 MED ORDER — HYDROCORTISONE 2.5 % EX CREA: TOPICAL_CREAM | Freq: Two times a day (BID) | CUTANEOUS | 1 refills | Status: DC

## 2016-11-03 NOTE — Patient Instructions (Addendum)
Medicare Annual Wellness Visit  [place] and the medical providers at [facility] strive to bring you the best medical care.  In doing so we not only want to address your current medical conditions and concerns but also to detect new conditions early and prevent illness, disease and health-related problems.    Medicare offers a yearly Wellness Visit which allows our clinical staff to assess your need for preventative services including immunizations, lifestyle education, counseling to decrease risk of preventable diseases and screening for fall risk and other medical concerns.    This visit is provided free of charge (no copay) for all Medicare recipients. The clinical pharmacists at [facility] have begun to conduct these Wellness Visits which will also include a thorough review of all your medications.    As you primary medical provider recommend that you make an appointment for your Annual Wellness Visit if you have not done so already this year.  You may set up this appointment before you leave today or you may call back ([phone]) and schedule an appointment.  Please make sure when you call that you mention that you are scheduling your Annual Wellness Visit with the clinical pharmacist so that the appointment may be made for the proper length of time.     Continue current medications. Continue good therapeutic lifestyle changes which include good diet and exercise. Fall precautions discussed with patient. If an FOBT was given today- please return it to our front desk. If you are over 53 years old - you may need Prevnar 52 or the adult Pneumonia vaccine.  **Flu shots are available--- please call and schedule a FLU-CLINIC appointment**  After your visit with [place] today you will receive a survey in the mail or online from [employer] regarding your care with [place]. Please take a moment to fill this out. Your feedback is very  important to [place] as you can help [place] better understand your patient needs as well as improve your experience and satisfaction. WE CARE ABOUT YOU!!!   Continue to drink plenty of fluids and stay well hydrated Sometimes a probiotic like align or the equate brand for a line can help by taking one daily every pop in late in your gut with normal bacterial flora Use Flonase 1 spray each nostril at bedtime Continue to take Mucinex maximum strength, blue and white in color, 1 twice daily for cough and congestion with a large glass of water Use nasal saline frequently through the day In the future if you develop more problems with ear cerumen, you can purchase Debrox over-the-counter and use 2-3 drops in each ear canal nightly for 3 nights and wait 1 week at and repeat the same procedure and a lot of times this will soften earwax so that it runs out and does not accumulate

## 2016-11-03 NOTE — Progress Notes (Signed)
 Subjective:    Patient ID: Melissa Carpenter, female    DOB: 06/08/1934, 83 y.o.   MRN: [id number]  HPI Pt here for follow up and management of chronic medical problems which includes hyperlipidemia and hypertension. She is taking medication regularly.The patient is doing well overall. Her blood pressure is elevated today. She also has a cough feels like her ears are stopped up and that her head is full. She's had lab work and we will review this with heard during the visit today. Her cholesterol numbers are also elevated like her husband's were elevated with a total LDL particle number is elevated at 1502. The LDL C is also higher than it was in the past but still within normal limits. Triglycerides were good at 133. The good cholesterol the HDL particle number is actually higher than it has been in the past. The CBC has a white blood cell count that is elevated compared to previously at 11,500. Her hemoglobin is good at 12.3 and the platelet count is adequate. Blood sugar slightly elevated at 104 with a normal creatinine and normal electrolytes. All liver function tests are normal. The vitamin D level is good at 43.8. Patient also brings in blood sugars for review. These are running in the 72-150 range fasting and during the day may be in the 100-150 range. She brings in readings through February. Blood pressures at home have been good with readings generally in the 122 140 range over the 50-60 range. Additional readings for this month are still good for blood sugars and low pressure. This should be scanned into the record. Patient denies any chest pain or shortness of breath. She is getting over this cold upper respiratory infection and still has head congestion. Her chest is better. She admits to not eating healthy over the past few weeks due to all the doctors visits with her husband. She's not having any nausea vomiting diarrhea blood in the stool or black tarry bowel movements and she is passing her water  without problems. She does have some ongoing constipation issues.   Patient Active Problem List   Diagnosis Date Noted  . Pre-diabetes 02/01/2016  . Chronic venous insufficiency 09/14/2015  . Metabolic syndrome 11/10/2013  . Gastroesophageal reflux disease with hiatal hernia 07/21/2013  . Osteoporosis 03/05/2013  . Tachycardia   . Edema   . Carotid artery disease (HCC)   . Dyslipidemia   . Mitral valve prolapse   . Ejection fraction   . Aortic insufficiency   . Hypertension   . Chest pain    Outpatient Encounter Prescriptions as of 11/03/2016  Medication Sig  . amLODipine-atorvastatin (CADUET) 10-40 MG tablet TAKE 1 TABLET BY MOUTH DAILY.  . aspirin 81 MG tablet Take 81 mg by mouth daily.  . blood glucose meter kit and supplies KIT Dispense based on patient and insurance preference. Use daily as directed.  . Calcium Carbonate-Vitamin D (CALTRATE 600+D) 600-400 MG-UNIT per tablet Take 1 tablet by mouth daily.  . cholecalciferol (VITAMIN D) 400 UNITS TABS Take 1,000 Units by mouth.    . fish oil-omega-3 fatty acids 1000 MG capsule Take 1 g by mouth daily.    . metoprolol succinate (TOPROL-XL) 100 MG 24 hr tablet TAKE 1 TABLET (100 MG TOTAL) BY MOUTH DAILY. TAKE WITH OR IMMEDIATELY FOLLOWING A MEAL.  . metoprolol tartrate (LOPRESSOR) 25 MG tablet Take 1 tablet (25 mg total) by mouth as needed.  . Multiple Vitamin (MULTIVITAMIN) tablet Take 1 tablet by mouth daily.    .   olmesartan (BENICAR) 40 MG tablet TAKE 1 TABLET BY MOUTH EVERY DAY  . pantoprazole (PROTONIX) 40 MG tablet TAKE 1 TABLET EVERY DAY  . triamcinolone cream (KENALOG) 0.1 % Apply 1 application topically 2 (two) times daily.  . [DISCONTINUED] doxycycline (VIBRA-TABS) 100 MG tablet Take 1 tablet (100 mg total) by mouth 2 (two) times daily. 1 po bid  . [DISCONTINUED] levofloxacin (LEVAQUIN) 500 MG tablet Take 1 tablet (500 mg total) by mouth daily.  . [DISCONTINUED] nystatin (MYCOSTATIN) 100000 UNIT/ML suspension Take 5 mLs  (500,000 Units total) by mouth 4 (four) times daily.  . [DISCONTINUED] predniSONE (STERAPRED UNI-PAK 21 TAB) 10 MG (21) TBPK tablet As directed x 6 days   No facility-administered encounter medications on file as of 11/03/2016.       Review of Systems  Constitutional: Negative.   HENT: Negative.   Eyes: Negative.   Respiratory: Negative.   Cardiovascular: Negative.   Gastrointestinal: Negative.   Endocrine: Negative.   Genitourinary: Negative.   Musculoskeletal: Negative.   Skin: Negative.   Allergic/Immunologic: Negative.   Neurological: Negative.   Hematological: Negative.   Psychiatric/Behavioral: Negative.        Objective:   Physical Exam  Constitutional: She is oriented to person, place, and time. She appears well-developed and well-nourished.  Patient is pleasant and relaxed  HENT:  Head: Normocephalic and atraumatic.  Left Ear: External ear normal.  Mouth/Throat: Oropharynx is clear and moist.  Some nasal congestion bilaterally. Ears cerumen right ear canal.  Eyes: Conjunctivae and EOM are normal. Pupils are equal, round, and reactive to light. Right eye exhibits no discharge. Left eye exhibits no discharge. No scleral icterus.  Neck: Normal range of motion. Neck supple. No thyromegaly present.  The patient does have some bilateral carotid bruits. There is no thyromegaly or anterior cervical adenopathy..  Cardiovascular: Normal rate, regular rhythm and intact distal pulses.   Murmur heard. The heart is regular at 72/m there is a grade 2/6 systolic ejection murmur.  Pulmonary/Chest: Effort normal and breath sounds normal. No respiratory distress. She has no wheezes. She has no rales. She exhibits no tenderness.  Clear anteriorly and posteriorly. Some minimal congestion with coughing which is more of a dry cough.  Abdominal: Soft. Bowel sounds are normal. She exhibits no mass. There is no tenderness. There is no rebound and no guarding.  Musculoskeletal: Normal range  of motion. She exhibits no edema.  Lymphadenopathy:    She has no cervical adenopathy.  Neurological: She is alert and oriented to person, place, and time. She has normal reflexes. No cranial nerve deficit.  Skin: Skin is warm and dry. No rash noted.  Psychiatric: She has a normal mood and affect. Her behavior is normal. Judgment and thought content normal.  Nursing note and vitals reviewed.   BP (!) 157/62 (BP Location: Left Arm)   Pulse 64   Temp 97.3 F (36.3 C) (Oral)   Ht 5' (1.524 m)   Wt 142 lb (64.4 kg)   BMI 27.73 kg/m   Ear irrigation to remove wax from right ear canal with use of Debrox if this does not work.      Assessment & Plan:  1. Vitamin D deficiency -Continue current treatment  2. Pure hypercholesterolemia -Just like her husband, try to do better with diet and exercise and continue current treatment pending the results of next lab work  3. Essential hypertension -The blood pressure was elevated today initially and will be checked again for she leaves office.  4. Gastroesophageal reflux disease, esophagitis presence not specified -No complaints with reflux. She will continue with watching her diet as closely as possible and with pantoprazole.  5. Pre-diabetes -Continue with aggressive therapeutic lifestyle changes. Continue watching diet.  6. Carotid artery disease, unspecified laterality (HCC) -Follow-up visit with cardiology.  7. Right ear impacted cerumen -Ear irrigation today to remove wax  8. Head congestion -Continue with nasal saline, Flonase 1 spray each nostril at bedtime and Mucinex for chest congestion.  9. Mitral valve prolapse -Follow-up with cardiology. We will call cardiology office and make sure they plan to see her again as the visit was scheduled now and she has not heard anything.  Meds ordered this encounter  Medications  . hydrocortisone 2.5 % cream    Sig: Apply topically 2 (two) times daily.    Dispense:  30 g    Refill:   1   Patient Instructions                       Medicare Annual Wellness Visit  [place] and the medical providers at [facility] strive to bring you the best medical care.  In doing so we not only want to address your current medical conditions and concerns but also to detect new conditions early and prevent illness, disease and health-related problems.    Medicare offers a yearly Wellness Visit which allows our clinical staff to assess your need for preventative services including immunizations, lifestyle education, counseling to decrease risk of preventable diseases and screening for fall risk and other medical concerns.    This visit is provided free of charge (no copay) for all Medicare recipients. The clinical pharmacists at [facility] have begun to conduct these Wellness Visits which will also include a thorough review of all your medications.    As you primary medical provider recommend that you make an appointment for your Annual Wellness Visit if you have not done so already this year.  You may set up this appointment before you leave today or you may call back ([phone]) and schedule an appointment.  Please make sure when you call that you mention that you are scheduling your Annual Wellness Visit with the clinical pharmacist so that the appointment may be made for the proper length of time.     Continue current medications. Continue good therapeutic lifestyle changes which include good diet and exercise. Fall precautions discussed with patient. If an FOBT was given today- please return it to our front desk. If you are over 50 years old - you may need Prevnar 13 or the adult Pneumonia vaccine.  **Flu shots are available--- please call and schedule a FLU-CLINIC appointment**  After your visit with us today you will receive a survey in the mail or online from [employer] regarding your care with us. Please take a moment to fill this out.  Your feedback is very important to us as you can help us better understand your patient needs as well as improve your experience and satisfaction. WE CARE ABOUT YOU!!!   Continue to drink plenty of fluids and stay well hydrated Sometimes a probiotic like align or the equate brand for a line can help by taking one daily every pop in late in your gut with normal bacterial flora Use Flonase 1 spray each nostril at bedtime Continue to take Mucinex maximum strength, blue and white in color, 1 twice daily for cough and congestion with a large glass   of water Use nasal saline frequently through the day In the future if you develop more problems with ear cerumen, you can purchase Debrox over-the-counter and use 2-3 drops in each ear canal nightly for 3 nights and wait 1 week at and repeat the same procedure and a lot of times this will soften earwax so that it runs out and does not accumulate     Arrie Senate MD

## 2016-11-22 DIAGNOSIS — M1612 Unilateral primary osteoarthritis, left hip: Secondary | ICD-10-CM | POA: Diagnosis not present

## 2016-11-22 DIAGNOSIS — M7062 Trochanteric bursitis, left hip: Secondary | ICD-10-CM | POA: Diagnosis not present

## 2016-11-22 DIAGNOSIS — M1611 Unilateral primary osteoarthritis, right hip: Secondary | ICD-10-CM | POA: Diagnosis not present

## 2016-11-22 DIAGNOSIS — M7061 Trochanteric bursitis, right hip: Secondary | ICD-10-CM | POA: Diagnosis not present

## 2016-11-28 DIAGNOSIS — R2232 Localized swelling, mass and lump, left upper limb: Secondary | ICD-10-CM | POA: Diagnosis not present

## 2016-11-28 DIAGNOSIS — M67449 Ganglion, unspecified hand: Secondary | ICD-10-CM | POA: Diagnosis not present

## 2016-11-28 DIAGNOSIS — M1812 Unilateral primary osteoarthritis of first carpometacarpal joint, left hand: Secondary | ICD-10-CM | POA: Diagnosis not present

## 2016-12-14 ENCOUNTER — Encounter: Payer: Self-pay | Admitting: Nurse Practitioner

## 2016-12-14 ENCOUNTER — Ambulatory Visit (INDEPENDENT_AMBULATORY_CARE_PROVIDER_SITE_OTHER): Payer: Medicare Other | Admitting: Nurse Practitioner

## 2016-12-14 VITALS — BP 118/66 | HR 64 | Temp 97.6°F | Ht 60.0 in | Wt 143.0 lb

## 2016-12-14 DIAGNOSIS — H6123 Impacted cerumen, bilateral: Secondary | ICD-10-CM | POA: Diagnosis not present

## 2016-12-14 NOTE — Patient Instructions (Signed)
Earwax Buildup Your ears make a substance called earwax. It may also be called cerumen. Sometimes, too much earwax builds up in your ear canal. This can cause ear pain and make it harder for you to hear. CAUSES This condition is caused by too much earwax production or buildup. RISK FACTORS The following factors may make you more likely to develop this condition:  Cleaning your ears often with swabs.  Having narrow ear canals.  Having earwax that is overly thick or sticky.  Having eczema.  Being dehydrated. SYMPTOMS Symptoms of this condition include:  Reduced hearing.  Ear drainage.  Ear pain.  Ear itch.  A feeling of fullness in the ear or feeling that the ear is plugged.  Ringing in the ear.  Coughing. DIAGNOSIS Your health care provider can diagnose this condition based on your symptoms and medical history. Your health care provider will also do an ear exam to look inside your ear with a scope (otoscope). You may also have a hearing test. TREATMENT Treatment for this condition includes:  Over-the-counter or prescription ear drops to soften the earwax.  Earwax removal by a health care provider. This may be done:  By flushing the ear with body-temperature water.  With a medical instrument that has a loop at the end (earwax curette).  With a suction device. HOME CARE INSTRUCTIONS  Take over-the-counter and prescription medicines only as told by your health care provider.  Do not put any objects, including an ear swab, into your ear. You can clean the opening of your ear canal with a washcloth.  Drink enough water to keep your urine clear or pale yellow.  If you have frequent earwax buildup or you use hearing aids, consider seeing your health care provider every 6-12 months for routine preventive ear cleanings. Keep all follow-up visits as told by your health care provider. SEEK MEDICAL CARE IF:  You have ear pain.  Your condition does not improve with  treatment.  You have hearing loss.  You have blood, pus, or other fluid coming from your ear. This information is not intended to replace advice given to you by your health care provider. Make sure you discuss any questions you have with your health care provider. Document Released: 09/07/2004 Document Revised: 11/22/2015 Document Reviewed: 03/17/2015 Elsevier Interactive Patient Education  2017 [employer].

## 2016-12-14 NOTE — Progress Notes (Signed)
   Subjective:    Patient ID: Melissa Carpenter, female    DOB: February 24, 1934, 83 y.o.   MRN: [id number]  HPI Patient comes into office with complaint of inability to hear out of right ear.  Patient states sound is sort of muffled in the right ear and excessive noise seems to aggravate the sensation.  She says she "can't hear herself talk" when there is excessive noise.     Review of Systems  HENT: Positive for tinnitus (patient hears a noise but can't really describe.). Negative for ear discharge, ear pain, rhinorrhea, sinus pain and trouble swallowing.   Eyes: Negative.   Respiratory: Positive for cough. Negative for shortness of breath (dry).   Cardiovascular: Negative.   Gastrointestinal: Negative.   Endocrine: Negative.   Genitourinary: Negative.   Musculoskeletal: Negative for myalgias.  Neurological: Negative for dizziness and light-headedness.  Psychiatric/Behavioral: Negative.        Objective:   Physical Exam  Constitutional: She is oriented to person, place, and time. She appears well-developed and well-nourished.  HENT:  Head: Normocephalic.  Right Ear: External ear normal.  Left Ear: External ear normal.  Mouth/Throat: Oropharynx is clear and moist.  Right external ear appears normal, but is tender to palpation at the tragus.  Eyes: Pupils are equal, round, and reactive to light.  Neck: Normal range of motion. Neck supple.  Cardiovascular: Normal rate and regular rhythm.   Murmur (3/6 systolic murmur) heard. Pulmonary/Chest: Effort normal and breath sounds normal.  Abdominal: Soft. Bowel sounds are normal.  Musculoskeletal: Normal range of motion.  Neurological: She is alert and oriented to person, place, and time.  Skin: Skin is warm and dry.  Psychiatric: She has a normal mood and affect. Her behavior is normal. Judgment and thought content normal.    BP 118/66   Pulse 64   Temp 97.6 F (36.4 C) (Oral)   Ht 5' (1.524 m)   Wt 143 lb (64.9 kg)   BMI 27.93 kg/m    s/p ear irrigation- TM's clear bilatrally    Assessment & Plan:   1. Bilateral impacted cerumen    Debrox OTC 2-3 x a week RTO pn  Larena-Margaret Hassell Done, FNP

## 2017-01-16 ENCOUNTER — Encounter: Payer: Self-pay | Admitting: Family Medicine

## 2017-01-16 ENCOUNTER — Ambulatory Visit (INDEPENDENT_AMBULATORY_CARE_PROVIDER_SITE_OTHER): Payer: Medicare Other | Admitting: Family Medicine

## 2017-01-16 VITALS — BP 118/58 | HR 60 | Temp 98.1°F | Ht 60.0 in | Wt 142.0 lb

## 2017-01-16 DIAGNOSIS — R42 Dizziness and giddiness: Secondary | ICD-10-CM | POA: Diagnosis not present

## 2017-01-16 DIAGNOSIS — H6981 Other specified disorders of Eustachian tube, right ear: Secondary | ICD-10-CM | POA: Diagnosis not present

## 2017-01-16 DIAGNOSIS — H938X1 Other specified disorders of right ear: Secondary | ICD-10-CM

## 2017-01-16 DIAGNOSIS — H9191 Unspecified hearing loss, right ear: Secondary | ICD-10-CM | POA: Diagnosis not present

## 2017-01-16 MED ORDER — FLUTICASONE PROPIONATE 50 MCG/ACT NA SUSP: 2.0000 | Freq: Every day | NASAL | 6 refills | Status: DC

## 2017-01-16 MED ORDER — PREDNISONE 10 MG PO TABS: ORAL_TABLET | ORAL | 0 refills | Status: DC

## 2017-01-16 MED ORDER — CEPHALEXIN 500 MG PO CAPS: 500.0000 mg | ORAL_CAPSULE | Freq: Three times a day (TID) | ORAL | 0 refills | Status: DC

## 2017-01-16 NOTE — Progress Notes (Signed)
Subjective:    Patient ID: Melissa Carpenter, female    DOB: [birth year], 83 y.o.   MRN: [id number]  HPI Patient here today for right ear pressure, "popping" and occasional dizziness. The problem in the ear has been going on for several months and especially since several episodes of bronchitis this past winter. She's had ear cerumen removed on a couple occasions. It is definitely bothersome. And she is noticed at times that there is even problems with her hearing on the side. She did try some Claritin and did not have any help with the Claritin.    Patient Active Problem List   Diagnosis Date Noted  . Pre-diabetes 02/01/2016  . Chronic venous insufficiency 09/14/2015  . Metabolic syndrome 48/54/6270  . Gastroesophageal reflux disease with hiatal hernia 07/21/2013  . Osteoporosis 03/05/2013  . Tachycardia   . Edema   . Carotid artery disease ([place])   . Dyslipidemia   . Mitral valve prolapse   . Ejection fraction   . Aortic insufficiency   . Hypertension   . Chest pain    Outpatient Encounter Prescriptions as of 01/16/2017  Medication Sig  . amLODipine-atorvastatin (CADUET) 10-40 MG tablet TAKE 1 TABLET BY MOUTH DAILY.  Marland Kitchen aspirin 81 MG tablet Take 81 mg by mouth daily.  . blood glucose meter kit and supplies KIT Dispense based on patient and insurance preference. Use daily as directed.  . Calcium Carbonate-Vitamin D (CALTRATE 600+D) 600-400 MG-UNIT per tablet Take 1 tablet by mouth daily.  . cholecalciferol (VITAMIN D) 400 UNITS TABS Take 1,000 Units by mouth.    . fish oil-omega-3 fatty acids 1000 MG capsule Take 1 g by mouth daily.    . hydrocortisone 2.5 % cream Apply topically 2 (two) times daily.  . metoprolol succinate (TOPROL-XL) 100 MG 24 hr tablet TAKE 1 TABLET (100 MG TOTAL) BY MOUTH DAILY. TAKE WITH OR IMMEDIATELY FOLLOWING A MEAL.  . metoprolol tartrate (LOPRESSOR) 25 MG tablet Take 1 tablet (25 mg total) by mouth as needed.  . Multiple Vitamin (MULTIVITAMIN) tablet Take 1  tablet by mouth daily.    Marland Kitchen olmesartan (BENICAR) 40 MG tablet TAKE 1 TABLET BY MOUTH EVERY DAY  . triamcinolone cream (KENALOG) 0.1 % Apply 1 application topically 2 (two) times daily.  . [DISCONTINUED] pantoprazole (PROTONIX) 40 MG tablet TAKE 1 TABLET EVERY DAY   No facility-administered encounter medications on file as of 01/16/2017.       Review of Systems  Constitutional: Negative.   HENT: Negative.        Right ear pressure and "popping"  Eyes: Negative.   Respiratory: Negative.   Cardiovascular: Negative.   Gastrointestinal: Negative.   Endocrine: Negative.   Genitourinary: Negative.   Musculoskeletal: Negative.   Skin: Negative.   Allergic/Immunologic: Negative.   Neurological: Positive for dizziness (at times).  Hematological: Negative.   Psychiatric/Behavioral: Negative.        Objective:   Physical Exam  Constitutional: She is oriented to person, place, and time. She appears well-developed and well-nourished. No distress.  The patient is pleasant and relaxed  HENT:  Head: Normocephalic and atraumatic.  Right Ear: External ear normal.  Left Ear: External ear normal.  Nose: Nose normal.  Mouth/Throat: Oropharynx is clear and moist.  Minimal earwax bilaterally with normal TMs  Eyes: Conjunctivae and EOM are normal. Pupils are equal, round, and reactive to light. Right eye exhibits no discharge. Left eye exhibits no discharge. No scleral icterus.  Neck: Normal range of motion. Neck supple.  No thyromegaly present.  No anterior cervical adenopathy  Cardiovascular: Normal rate, regular rhythm and normal heart sounds.   No murmur heard. Pulmonary/Chest: Effort normal and breath sounds normal. No respiratory distress. She has no wheezes. She has no rales.  Musculoskeletal: Normal range of motion. She exhibits no edema.  Lymphadenopathy:    She has no cervical adenopathy.  Neurological: She is alert and oriented to person, place, and time.  Skin: Skin is warm and dry.  No rash noted.  Psychiatric: She has a normal mood and affect. Her behavior is normal. Judgment and thought content normal.  Nursing note and vitals reviewed.   BP (!) 118/58 (BP Location: Left Arm)   Pulse 60   Temp 98.1 F (36.7 C) (Oral)   Ht 5' (1.524 m)   Wt 142 lb (64.4 kg)   BMI 27.73 kg/m        Assessment & Plan:  1. Ear pressure, right -Take antibiotic and use nasal steroid as directed - Ambulatory referral to ENT  2. Dizziness -Take antibiotic and use nasal steroid as directed - Ambulatory referral to ENT  3. Dysfunction of right eustachian tube -Take prednisone as directed  4. Decreased hearing of right ear -Appointment with Dr. Janace Hoard for follow-up if not improved  Meds ordered this encounter  Medications  . fluticasone (FLONASE) 50 MCG/ACT nasal spray    Sig: Place 2 sprays into both nostrils daily.    Dispense:  16 g    Refill:  6  . cephALEXin (KEFLEX) 500 MG capsule    Sig: Take 1 capsule (500 mg total) by mouth 3 (three) times daily.    Dispense:  30 capsule    Refill:  0  . predniSONE (DELTASONE) 10 MG tablet    Sig: Take 1 tab QID x 2 days, then 1 tab TID x 2 days then 1 tab BID x 2 days, then 1 tab QD x 2 days.    Dispense:  20 tablet    Refill:  0   Patient Instructions  Take antibiotic as directed Flonase 1-2 sprays each nostril daily Take prednisone as directed and until completed We will schedule an appointment with ear nose and throat specialist 2-3 weeks out so that if you're not better you keep that appointment if you are better he should cancel the appointment.  Arrie Senate MD

## 2017-01-16 NOTE — Patient Instructions (Signed)
Take antibiotic as directed Flonase 1-2 sprays each nostril daily Take prednisone as directed and until completed We will schedule an appointment with ear nose and throat specialist 2-3 weeks out so that if you're not better you keep that appointment if you are better he should cancel the appointment.

## 2017-01-25 ENCOUNTER — Other Ambulatory Visit: Payer: Self-pay | Admitting: Family Medicine

## 2017-02-09 DIAGNOSIS — R42 Dizziness and giddiness: Secondary | ICD-10-CM | POA: Diagnosis not present

## 2017-02-09 DIAGNOSIS — H6983 Other specified disorders of Eustachian tube, bilateral: Secondary | ICD-10-CM | POA: Diagnosis not present

## 2017-02-09 DIAGNOSIS — H903 Sensorineural hearing loss, bilateral: Secondary | ICD-10-CM | POA: Diagnosis not present

## 2017-02-20 NOTE — Progress Notes (Signed)
Cardiology Office Note   Date:  02/21/2017   ID:  Melissa Carpenter, DOB Feb 27, 1934, MRN [id number]  PCP:  Chipper Herb, MD  Cardiologist:   Minus Breeding, MD   Chief Complaint  Patient presents with  . Palpitations      History of Present Illness: Melissa Carpenter is a 83 y.o. female who presents for follow up of palpitations and HTN.  She has seen Dr. Ron Parker in the past for these complaints.  Since I last saw her she has done well.  She has had no further palpitations.  The patient denies any new symptoms such as chest discomfort, neck or arm discomfort. There has been no new shortness of breath, PND or orthopnea. There have been no reported palpitations, presyncope or syncope.    Past Medical History:  Diagnosis Date  . Aortic insufficiency    Mild, echo, December, 2009  . Carotid artery disease ([place])    , Doppler, 2003  . Cataract   . Cellulitis   . Chest pain    Catheterization, 2003, no significant CAD  . Diastolic dysfunction    Mild diastolic dysfunction, echo, December, 2012  . DJD (degenerative joint disease)   . Dyslipidemia   . Edema    November, 2012  . Ejection fraction    EF 60-70%, echo, December, 2009 / EF 55-60%, December, 2012  . GERD (gastroesophageal reflux disease)   . Hyperlipidemia   . Hypertension   . Mitral valve prolapse    Echo, 2009, very mild intermittent prolapse of the posterior leaflet, no MR  . Osteoporosis   . Tachycardia    Nighttime tachycardia, February, 2014  . Thyroid nodule     Past Surgical History:  Procedure Laterality Date  . ABDOMINAL HYSTERECTOMY    . APPENDECTOMY    . BIOPSY THYROID Left   . CARDIAC CATHETERIZATION  sept 2003   no significant cad  . EYE SURGERY    . NASAL SINUS SURGERY    . TONSILLECTOMY       Current Outpatient Prescriptions  Medication Sig Dispense Refill  . amLODipine-atorvastatin (CADUET) 10-40 MG tablet TAKE 1 TABLET BY MOUTH DAILY. 90 tablet 1  . aspirin 81 MG tablet Take 81 mg by mouth  daily.    . Calcium Carbonate-Vitamin D (CALTRATE 600+D) 600-400 MG-UNIT per tablet Take 1 tablet by mouth daily.    . cholecalciferol (VITAMIN D) 400 UNITS TABS Take 1,000 Units by mouth.      . fish oil-omega-3 fatty acids 1000 MG capsule Take 1 g by mouth daily.      . hydrocortisone 2.5 % cream Apply topically 2 (two) times daily. 30 g 1  . metoprolol succinate (TOPROL-XL) 100 MG 24 hr tablet TAKE 1 TABLET (100 MG TOTAL) BY MOUTH DAILY. TAKE WITH OR IMMEDIATELY FOLLOWING A MEAL. 90 tablet 1  . metoprolol tartrate (LOPRESSOR) 25 MG tablet Take 1 tablet (25 mg total) by mouth as needed. 30 tablet 3  . Multiple Vitamin (MULTIVITAMIN) tablet Take 1 tablet by mouth daily.      Marland Kitchen olmesartan (BENICAR) 40 MG tablet TAKE 1 TABLET BY MOUTH EVERY DAY 90 tablet 1  . blood glucose meter kit and supplies KIT Dispense based on patient and insurance preference. Use daily as directed. 1 each 11   No current facility-administered medications for this visit.     Allergies:   Codeine; Evista [raloxifene]; Forteo [teriparatide (recombinant)]; Morphine; Penicillins; Sulfites; and Sulfonamide derivatives    ROS:  Please see  the history of present illness.   Otherwise, review of systems are positive for none   All other systems are reviewed and negative.    PHYSICAL EXAM: VS:  BP 118/60   Pulse 68   Ht 5' (1.524 m)   Wt 142 lb (64.4 kg)   BMI 27.73 kg/m  , BMI Body mass index is 27.73 kg/m.  GENERAL:  Well appearing NECK:  No jugular venous distention, waveform within normal limits, carotid upstroke brisk and symmetric, bilateral bruits, no thyromegaly LUNGS:  Clear to auscultation bilaterally BACK:  No CVA tenderness CHEST:  Unremarkable HEART:  PMI not displaced or sustained,S1 and S2 within normal limits, no S3, no S4, no clicks, no rubs, 2/6 apical systolic murmur radiating slightly out the outflow tract.  No diastolic murmurs.\ebound, no guarding, no midline pulsatile mass, no hepatomegaly, no  splenomegaly EXT:  2 plus pulses throughout, mild edema, no cyanosis no clubbing   EKG:  EKG is  ordered today. Normal sinus rhythm, rate 65, axis within normal limits, intervals within normal limits, no acute ST wave changes.   Recent Labs: 10/27/2016: ALT 22; BUN 16; Creatinine, Ser 0.63; Hemoglobin 12.3; Platelets 265; Potassium 4.2; Sodium 142    Lipid Panel    Component Value Date/Time   CHOL 183 10/27/2016 0844   CHOL 174 06/07/2016 1026   CHOL 165 01/13/2013 0833   TRIG 133 10/27/2016 0844   TRIG 240 (H) 01/13/2013 0833   HDL 57 10/27/2016 0844   HDL 39 (L) 01/13/2013 0833   CHOLHDL 3.2 06/07/2016 1026   LDLCALC 73 06/07/2016 1026   LDLCALC 83 03/18/2014 0819   LDLCALC 78 01/13/2013 0833      Wt Readings from Last 3 Encounters:  02/21/17 142 lb (64.4 kg)  01/16/17 142 lb (64.4 kg)  12/14/16 143 lb (64.9 kg)      Other studies Reviewed: Additional studies/ records that were reviewed today include:  Labs Review of the above records demonstrates:     ASSESSMENT AND PLAN:  HTN:  The blood pressure is at target.  No change in therapy is indicated.   PALPITATIONS:  These are not bothering her.  No change in therapy is indicated.   CAROTID STENOSIS:  She had mild plaque last year.  No follow up is planned.  I will likely repeat this next year.  MURMUR:   This was mild AS.  I will consider repeating this next year.  There was an echo in 2012.  I don't suspect that this is more than mild or moderate AS.   HTN:   Her blood pressure was at target.  She can continue the meds as listed.    Current medicines are reviewed at length with the patient today.  The patient does not have concerns regarding medicines.  The following changes have been made:none   Labs/ tests ordered today include:   none  Orders Placed This Encounter  Procedures  . EKG 12-Lead     Disposition:   FU with me in 12 months.    Signed, Minus Breeding, MD  02/21/2017 11:07 AM    [facility]

## 2017-02-21 ENCOUNTER — Ambulatory Visit (INDEPENDENT_AMBULATORY_CARE_PROVIDER_SITE_OTHER): Payer: Medicare Other | Admitting: Cardiology

## 2017-02-21 ENCOUNTER — Encounter: Payer: Self-pay | Admitting: Cardiology

## 2017-02-21 VITALS — BP 118/60 | HR 68 | Ht 60.0 in | Wt 142.0 lb

## 2017-02-21 DIAGNOSIS — I10 Essential (primary) hypertension: Secondary | ICD-10-CM | POA: Diagnosis not present

## 2017-02-21 DIAGNOSIS — R002 Palpitations: Secondary | ICD-10-CM | POA: Insufficient documentation

## 2017-02-21 DIAGNOSIS — I350 Nonrheumatic aortic (valve) stenosis: Secondary | ICD-10-CM | POA: Insufficient documentation

## 2017-02-21 NOTE — Patient Instructions (Signed)

## 2017-02-23 ENCOUNTER — Encounter: Payer: Self-pay | Admitting: Family Medicine

## 2017-03-01 ENCOUNTER — Other Ambulatory Visit: Payer: Medicare Other

## 2017-03-01 ENCOUNTER — Other Ambulatory Visit: Payer: Self-pay | Admitting: *Deleted

## 2017-03-01 DIAGNOSIS — E785 Hyperlipidemia, unspecified: Secondary | ICD-10-CM

## 2017-03-01 DIAGNOSIS — I739 Peripheral vascular disease, unspecified: Principal | ICD-10-CM

## 2017-03-01 DIAGNOSIS — E8881 Metabolic syndrome: Secondary | ICD-10-CM

## 2017-03-01 DIAGNOSIS — I10 Essential (primary) hypertension: Secondary | ICD-10-CM

## 2017-03-01 DIAGNOSIS — I779 Disorder of arteries and arterioles, unspecified: Secondary | ICD-10-CM | POA: Diagnosis not present

## 2017-03-01 DIAGNOSIS — R7303 Prediabetes: Secondary | ICD-10-CM

## 2017-03-01 DIAGNOSIS — M810 Age-related osteoporosis without current pathological fracture: Secondary | ICD-10-CM | POA: Diagnosis not present

## 2017-03-02 DIAGNOSIS — H6981 Other specified disorders of Eustachian tube, right ear: Secondary | ICD-10-CM | POA: Diagnosis not present

## 2017-03-02 DIAGNOSIS — H6983 Other specified disorders of Eustachian tube, bilateral: Secondary | ICD-10-CM | POA: Insufficient documentation

## 2017-03-02 LAB — NMR, LIPOPROFILE
CHOLESTEROL: 145 mg/dL (ref 100–199)
HDL CHOLESTEROL BY NMR: 38 mg/dL — AB (ref >39)
HDL PARTICLE NUMBER: 29.7 umol/L — AB (ref >=30.5)
LDL PARTICLE NUMBER: 1317 nmol/L — AB (ref <1000)
LDL Size: 20.2 nm (ref >20.5)
LDL-C: 67 mg/dL (ref 0–99)
LP-IR SCORE: 53 — AB (ref <=45)
Small LDL Particle Number: 915 nmol/L — ABNORMAL HIGH (ref <=527)
TRIGLYCERIDES BY NMR: 198 mg/dL — AB (ref 0–149)

## 2017-03-02 LAB — CBC WITH DIFFERENTIAL/PLATELET
BASOS ABS: 0 10*3/uL (ref 0.0–0.2)
Basos: 0 %
EOS (ABSOLUTE): 0.1 10*3/uL (ref 0.0–0.4)
EOS: 2 %
HEMATOCRIT: 38.1 % (ref 34.0–46.6)
HEMOGLOBIN: 13 g/dL (ref 11.1–15.9)
IMMATURE GRANS (ABS): 0 10*3/uL (ref 0.0–0.1)
Immature Granulocytes: 0 %
LYMPHS ABS: 1.6 10*3/uL (ref 0.7–3.1)
LYMPHS: 29 %
MCH: 32 pg (ref 26.6–33.0)
MCHC: 34.1 g/dL (ref 31.5–35.7)
MCV: 94 fL (ref 79–97)
MONOCYTES: 10 %
Monocytes Absolute: 0.6 10*3/uL (ref 0.1–0.9)
Neutrophils Absolute: 3.2 10*3/uL (ref 1.4–7.0)
Neutrophils: 59 %
Platelets: 215 10*3/uL (ref 150–379)
RBC: 4.06 x10E6/uL (ref 3.77–5.28)
RDW: 13.5 % (ref 12.3–15.4)
WBC: 5.4 10*3/uL (ref 3.4–10.8)

## 2017-03-02 LAB — CMP14+EGFR
ALBUMIN: 4.9 g/dL — AB (ref 3.5–4.7)
ALK PHOS: 84 IU/L (ref 39–117)
ALT: 27 IU/L (ref 0–32)
AST: 31 IU/L (ref 0–40)
Albumin/Globulin Ratio: 2.3 — ABNORMAL HIGH (ref 1.2–2.2)
BILIRUBIN TOTAL: 0.3 mg/dL (ref 0.0–1.2)
BUN / CREAT RATIO: 27 (ref 12–28)
BUN: 15 mg/dL (ref 8–27)
CHLORIDE: 101 mmol/L (ref 96–106)
CO2: 22 mmol/L (ref 20–29)
CREATININE: 0.55 mg/dL — AB (ref 0.57–1.00)
Calcium: 9.6 mg/dL (ref 8.7–10.3)
GFR calc non Af Amer: 89 mL/min/{1.73_m2} (ref >59)
GFR, EST AFRICAN AMERICAN: 102 mL/min/{1.73_m2} (ref >59)
GLUCOSE: 104 mg/dL — AB (ref 65–99)
Globulin, Total: 2.1 g/dL (ref 1.5–4.5)
Potassium: 4.2 mmol/L (ref 3.5–5.2)
Sodium: 142 mmol/L (ref 134–144)
TOTAL PROTEIN: 7 g/dL (ref 6.0–8.5)

## 2017-03-03 ENCOUNTER — Other Ambulatory Visit: Payer: Self-pay | Admitting: Family Medicine

## 2017-03-08 ENCOUNTER — Ambulatory Visit (INDEPENDENT_AMBULATORY_CARE_PROVIDER_SITE_OTHER): Payer: Medicare Other | Admitting: Family Medicine

## 2017-03-08 ENCOUNTER — Encounter: Payer: Self-pay | Admitting: Family Medicine

## 2017-03-08 VITALS — BP 136/70 | HR 67 | Temp 99.5°F | Ht 60.0 in | Wt 144.0 lb

## 2017-03-08 DIAGNOSIS — E559 Vitamin D deficiency, unspecified: Secondary | ICD-10-CM

## 2017-03-08 DIAGNOSIS — L2081 Atopic neurodermatitis: Secondary | ICD-10-CM

## 2017-03-08 DIAGNOSIS — R7303 Prediabetes: Secondary | ICD-10-CM

## 2017-03-08 DIAGNOSIS — E7800 Pure hypercholesterolemia, unspecified: Secondary | ICD-10-CM

## 2017-03-08 DIAGNOSIS — I10 Essential (primary) hypertension: Secondary | ICD-10-CM | POA: Diagnosis not present

## 2017-03-08 DIAGNOSIS — K219 Gastro-esophageal reflux disease without esophagitis: Secondary | ICD-10-CM

## 2017-03-08 NOTE — Progress Notes (Signed)
Subjective:    Patient ID: Melissa Carpenter, female    DOB: 04-Nov-1933, 83 y.o.   MRN: [id number]  HPI Pt here for follow up and management of chronic medical problems which includes hyperlipidemia and hypertension. She is taking medication regualrly.The patient is doing well overall today she only complains of a rash on her chest. She will g cholesterol numbers with advanced lipid testing are elevated with a total LDL particle number being 1317 and a good LDL C cholesterol. Triglycerides are also elevated at 198 and the HDL particle number is low. We have talked about switching her to a different omega-3 fatty acid to see if we can get better control on the LDL particle number and not changing her Caduet. The blood sugar was slightly elevated at 104 and the creatinine was in fact slightly decreased. All electrolytes were good. The CBC had a good hemoglobin and a normal white blood cell count with an adequate platelet count. The lab results will be reviewed with her during the visit today. She does bring in blood pressures and blood sugars for review from the outside and overall these are good with a few outliers a couple of hours after eating with blood sugars elevated in the 160-170 range. All blood pressures from home were good. This will be scanned into her record. I do not see a recent thyroid profile and we will try to add this to the recent blood work.     Patient Active Problem List   Diagnosis Date Noted  . Palpitations 02/21/2017  . Aortic stenosis 02/21/2017  . Pre-diabetes 02/01/2016  . Chronic venous insufficiency 09/14/2015  . Metabolic syndrome 57/32/2025  . Gastroesophageal reflux disease with hiatal hernia 07/21/2013  . Osteoporosis 03/05/2013  . Tachycardia   . Edema   . Carotid artery disease ([place])   . Dyslipidemia   . Mitral valve prolapse   . Ejection fraction   . Aortic insufficiency   . Hypertension   . Chest pain    Outpatient Encounter Prescriptions as of 03/08/2017    Medication Sig  . amLODipine-atorvastatin (CADUET) 10-40 MG tablet TAKE 1 TABLET BY MOUTH DAILY.  Marland Kitchen aspirin 81 MG tablet Take 81 mg by mouth daily.  . blood glucose meter kit and supplies KIT Dispense based on patient and insurance preference. Use daily as directed.  . Calcium Carbonate-Vitamin D (CALTRATE 600+D) 600-400 MG-UNIT per tablet Take 1 tablet by mouth daily.  . cholecalciferol (VITAMIN D) 400 UNITS TABS Take 1,000 Units by mouth.    . fish oil-omega-3 fatty acids 1000 MG capsule Take 1 g by mouth daily.    . hydrocortisone 2.5 % cream Apply topically 2 (two) times daily.  . metoprolol succinate (TOPROL-XL) 100 MG 24 hr tablet TAKE 1 TABLET (100 MG TOTAL) BY MOUTH DAILY. TAKE WITH OR IMMEDIATELY FOLLOWING A MEAL.  . metoprolol tartrate (LOPRESSOR) 25 MG tablet Take 1 tablet (25 mg total) by mouth as needed.  . Multiple Vitamin (MULTIVITAMIN) tablet Take 1 tablet by mouth daily.    Marland Kitchen olmesartan (BENICAR) 40 MG tablet TAKE 1 TABLET BY MOUTH EVERY DAY   No facility-administered encounter medications on file as of 03/08/2017.       Review of Systems  Constitutional: Negative.   HENT: Negative.   Eyes: Negative.   Respiratory: Negative.   Cardiovascular: Negative.   Gastrointestinal: Negative.   Endocrine: Negative.   Genitourinary: Negative.   Musculoskeletal: Negative.   Skin: Positive for rash (on chest ).  Allergic/Immunologic: Negative.  Neurological: Negative.   Hematological: Negative.   Psychiatric/Behavioral: Negative.        Objective:   Physical Exam  Constitutional: She is oriented to person, place, and time. She appears well-developed and well-nourished. No distress.  The patient is pleasant and relaxed and somewhat frustrated with motivating her husband to follow the doctor's recommendations because of his prostate cancer  HENT:  Head: Normocephalic and atraumatic.  Left Ear: External ear normal.  Nose: Nose normal.  Mouth/Throat: Oropharynx is clear  and moist.  Status post myringotomy tube placement  Eyes: Pupils are equal, round, and reactive to light. Conjunctivae and EOM are normal. Right eye exhibits no discharge. Left eye exhibits no discharge. No scleral icterus.  Neck: Normal range of motion. Neck supple. No thyromegaly present.  No thyromegaly or adenopathy but does have persistent bruits and cardiologist is aware of this and will follow-up as needed  Cardiovascular: Normal rate, regular rhythm, normal heart sounds and intact distal pulses.   No murmur heard. The heart has a regular rate and rhythm at 72/m  Pulmonary/Chest: Effort normal and breath sounds normal. No respiratory distress. She has no wheezes. She has no rales.  Lungs are clear anteriorly and posteriorly  Abdominal: Soft. Bowel sounds are normal. She exhibits no mass. There is no tenderness. There is no rebound and no guarding.  No abdominal tenderness masses or bruits and no inguinal adenopathy  Musculoskeletal: Normal range of motion. She exhibits no edema.  Lymphadenopathy:    She has no cervical adenopathy.  Neurological: She is alert and oriented to person, place, and time. She has normal reflexes. No cranial nerve deficit.  Skin: Skin is warm and dry. Rash noted. There is erythema.  Multiple areas of papules that are red and inflamed on the open this of her anterior chest between and beneath her neck on her chest wall.  Psychiatric: She has a normal mood and affect. Her behavior is normal. Judgment and thought content normal.  Nursing note and vitals reviewed.  BP 136/70 (BP Location: Left Arm)   Pulse 67   Temp 99.5 F (37.5 C) (Oral)   Ht 5' (1.524 m)   Wt 144 lb (65.3 kg)   BMI 28.12 kg/m         Assessment & Plan:  1. Vitamin D deficiency -Continue current treatment  2. Essential hypertension -Continue current treatment and watch sodium intake  3. Pure hypercholesterolemia -Try to do better with therapeutic lifestyle changes and continue  current treatment until next visit  4. Gastroesophageal reflux disease, esophagitis presence not specified -No complaints with reflux today.  5. Pre-diabetes -Hemoglobin A1c added to current blood work  6. Atopic neurodermatitis -Use cortisone 10 to affected rash and avoid soaps fabric softeners and detergents that are scented  Patient Instructions                       Medicare Annual Wellness Visit  [facility] and the medical providers at [facility] strive to bring you the best medical care.  In doing so we not only want to address your current medical conditions and concerns but also to detect new conditions early and prevent illness, disease and health-related problems.    Medicare offers a yearly Wellness Visit which allows our clinical staff to assess your need for preventative services including immunizations, lifestyle education, counseling to decrease risk of preventable diseases and screening for fall risk and other medical concerns.    This visit   is provided free of charge (no copay) for all Medicare recipients. The clinical pharmacists at [facility] have begun to conduct these Wellness Visits which will also include a thorough review of all your medications.    As you primary medical provider recommend that you make an appointment for your Annual Wellness Visit if you have not done so already this year.  You may set up this appointment before you leave today or you may call back ([phone]) and schedule an appointment.  Please make sure when you call that you mention that you are scheduling your Annual Wellness Visit with the clinical pharmacist so that the appointment may be made for the proper length of time.    Continue current medications. Continue good therapeutic lifestyle changes which include good diet and exercise. Fall precautions discussed with patient. If an FOBT was given today- please return it to our front desk. If  you are over 81 years old - you may need Prevnar 13 or the adult Pneumonia vaccine.  **Flu shots are available--- please call and schedule a FLU-CLINIC appointment**  After your visit with us today you will receive a survey in the mail or online from [employer] regarding your care with us. Please take a moment to fill this out. Your feedback is very important to us as you can help us better understand your patient needs as well as improve your experience and satisfaction. WE CARE ABOUT YOU!!!   Stay active drink plenty of fluids and stay well hydrated Use cortisone 10 sparingly and apply this twice daily to affected areas on skin for the next 7-10 days no more than twice daily Use scent free soaps fabric softeners and detergents Get back in touch with us if the rash persists beyond 7-10 days and we will look at other options. We will call with results of the thyroid profile and a hemoglobin A1c as soon as those values are returned    Don W. Moore MD   

## 2017-03-08 NOTE — Patient Instructions (Addendum)
Medicare Annual Wellness Visit  [facility] and the medical providers at [facility] strive to bring you the best medical care.  In doing so we not only want to address your current medical conditions and concerns but also to detect new conditions early and prevent illness, disease and health-related problems.    Medicare offers a yearly Wellness Visit which allows our clinical staff to assess your need for preventative services including immunizations, lifestyle education, counseling to decrease risk of preventable diseases and screening for fall risk and other medical concerns.    This visit is provided free of charge (no copay) for all Medicare recipients. The clinical pharmacists at [facility] have begun to conduct these Wellness Visits which will also include a thorough review of all your medications.    As you primary medical provider recommend that you make an appointment for your Annual Wellness Visit if you have not done so already this year.  You may set up this appointment before you leave today or you may call back ([phone]) and schedule an appointment.  Please make sure when you call that you mention that you are scheduling your Annual Wellness Visit with the clinical pharmacist so that the appointment may be made for the proper length of time.    Continue current medications. Continue good therapeutic lifestyle changes which include good diet and exercise. Fall precautions discussed with patient. If an FOBT was given today- please return it to our front desk. If you are over 37 years old - you may need Prevnar 2 or the adult Pneumonia vaccine.  **Flu shots are available--- please call and schedule a FLU-CLINIC appointment**  After your visit with [place] today you will receive a survey in the mail or online from [employer] regarding your care with [place]. Please take a moment to fill this out. Your feedback is very  important to [place] as you can help [place] better understand your patient needs as well as improve your experience and satisfaction. WE CARE ABOUT YOU!!!   Stay active drink plenty of fluids and stay well hydrated Use cortisone 10 sparingly and apply this twice daily to affected areas on skin for the next 7-10 days no more than twice daily Use scent free soaps fabric softeners and detergents Get back in touch with [place] if the rash persists beyond 7-10 days and we will look at other options. We will call with results of the thyroid profile and a hemoglobin A1c as soon as those values are returned

## 2017-03-16 LAB — SPECIMEN STATUS REPORT

## 2017-03-26 ENCOUNTER — Telehealth: Payer: Self-pay | Admitting: Family Medicine

## 2017-03-26 NOTE — Telephone Encounter (Signed)
Pt scheduled for DEXA 04/02/17 at 9am; Pt aware

## 2017-04-02 ENCOUNTER — Other Ambulatory Visit (INDEPENDENT_AMBULATORY_CARE_PROVIDER_SITE_OTHER): Payer: Medicare Other

## 2017-04-02 ENCOUNTER — Other Ambulatory Visit: Payer: Self-pay | Admitting: Family Medicine

## 2017-04-02 DIAGNOSIS — Z780 Asymptomatic menopausal state: Secondary | ICD-10-CM

## 2017-04-03 DIAGNOSIS — H6983 Other specified disorders of Eustachian tube, bilateral: Secondary | ICD-10-CM | POA: Diagnosis not present

## 2017-06-12 ENCOUNTER — Ambulatory Visit (INDEPENDENT_AMBULATORY_CARE_PROVIDER_SITE_OTHER): Payer: Medicare Other

## 2017-06-12 DIAGNOSIS — Z23 Encounter for immunization: Secondary | ICD-10-CM | POA: Diagnosis not present

## 2017-06-14 DIAGNOSIS — S0340XA Sprain of jaw, unspecified side, initial encounter: Secondary | ICD-10-CM | POA: Diagnosis not present

## 2017-06-14 DIAGNOSIS — H6983 Other specified disorders of Eustachian tube, bilateral: Secondary | ICD-10-CM | POA: Diagnosis not present

## 2017-06-14 DIAGNOSIS — H93A9 Pulsatile tinnitus, unspecified ear: Secondary | ICD-10-CM | POA: Diagnosis not present

## 2017-06-14 DIAGNOSIS — H9313 Tinnitus, bilateral: Secondary | ICD-10-CM | POA: Insufficient documentation

## 2017-06-18 ENCOUNTER — Telehealth: Payer: Self-pay | Admitting: Cardiology

## 2017-06-18 NOTE — Telephone Encounter (Signed)
Pt complaining that she feels like her heart is beating in her head.She wanted to be seen,Appt was made for this Wednesday.Please call and see if pt needs to be seen before Wednesday,

## 2017-06-18 NOTE — Telephone Encounter (Signed)
Returned call to patient.  Patient states that she is feeling her heartbeat in her head x 1 week.  States she intially thought it was her ear that she has a tube in but went to the doctor and her ear was okay. Denies SOB, CP, palpitations.   Denies blurry vision.   Feels this sensation all the time, does not come and go.   Patient cannot report specific BP readings but states it has been "good".   States she believes it was 140s this morning.    Advised to monitor BP and keep scheduled appointment on Wednesday.  Patient verbalized understanding.

## 2017-06-20 ENCOUNTER — Ambulatory Visit (INDEPENDENT_AMBULATORY_CARE_PROVIDER_SITE_OTHER): Payer: Medicare Other | Admitting: Cardiology

## 2017-06-20 ENCOUNTER — Encounter: Payer: Self-pay | Admitting: Cardiology

## 2017-06-20 DIAGNOSIS — I350 Nonrheumatic aortic (valve) stenosis: Secondary | ICD-10-CM | POA: Diagnosis not present

## 2017-06-20 DIAGNOSIS — IMO0001 Reserved for inherently not codable concepts without codable children: Secondary | ICD-10-CM | POA: Insufficient documentation

## 2017-06-20 DIAGNOSIS — E785 Hyperlipidemia, unspecified: Secondary | ICD-10-CM | POA: Diagnosis not present

## 2017-06-20 DIAGNOSIS — I6523 Occlusion and stenosis of bilateral carotid arteries: Secondary | ICD-10-CM | POA: Diagnosis not present

## 2017-06-20 DIAGNOSIS — I10 Essential (primary) hypertension: Secondary | ICD-10-CM | POA: Diagnosis not present

## 2017-06-20 DIAGNOSIS — Z0389 Encounter for observation for other suspected diseases and conditions ruled out: Secondary | ICD-10-CM | POA: Diagnosis not present

## 2017-06-20 NOTE — Progress Notes (Signed)
06/20/2017 Melissa Carpenter   1934/07/28  [id number]  Primary Physician Chipper Herb, MD Primary Cardiologist: Dr Percival Spanish  HPI:  Pleasant 83 y/o female followed by Dr Percival Spanish and Dr Laurance Flatten with a history of palpitations, HLD, and HTN. 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 She had a remote cath in '03 that showed no significant CAD and a nuclear stress done in 2010 was negative. She is in the office today with complaints of "hearing her heartbeat in her head". This is not just at night, she tells me she can hear it all day, but worse at night. She does have a myringotomy tube in her right ear and thinks this is more prominent on the right. She also complains of palpitations at night, "hard and fast" and she takes lopressor PRN. She denies any significant chest pain, SOB, or syncope.   Current Outpatient Medications  Medication Sig Dispense Refill  . amLODipine-atorvastatin (CADUET) 10-40 MG tablet TAKE 1 TABLET BY MOUTH DAILY. 90 tablet 1  . aspirin 81 MG tablet Take 81 mg by mouth daily.    . blood glucose meter kit and supplies KIT Dispense based on patient and insurance preference. Use daily as directed. 1 each 11  . Calcium Carbonate-Vitamin D (CALTRATE 600+D) 600-400 MG-UNIT per tablet Take 1 tablet by mouth daily.    . cholecalciferol (VITAMIN D) 400 UNITS TABS Take 1,000 Units by mouth.      . fish oil-omega-3 fatty acids 1000 MG capsule Take 1 g by mouth daily.      . hydrocortisone 2.5 % cream Apply topically 2 (two) times daily. 30 g 1  . metoprolol succinate (TOPROL-XL) 100 MG 24 hr tablet TAKE 1 TABLET (100 MG TOTAL) BY MOUTH DAILY. TAKE WITH OR IMMEDIATELY FOLLOWING A MEAL. 90 tablet 1  . metoprolol tartrate (LOPRESSOR) 25 MG tablet Take 1 tablet (25 mg total) by mouth as needed. 30 tablet 3  . Multiple Vitamin (MULTIVITAMIN) tablet Take 1 tablet by mouth daily.      Marland Kitchen olmesartan (BENICAR) 40 MG tablet TAKE 1 TABLET BY MOUTH EVERY DAY 90 tablet 1   No current facility-administered  medications for this visit.     Allergies  Allergen Reactions  . Codeine Hives  . Forteo [Teriparatide (Recombinant)] Other (See Comments)    Weakness and calcium increase  . Raloxifene Other (See Comments)    Eye problems Other reaction(s): Other (See Comments) Eye problems Eye problems  . Morphine Rash  . Penicillins Rash  . Sulfites Rash  . Sulfonamide Derivatives Rash    Past Medical History:  Diagnosis Date  . Aortic insufficiency    Mild, echo, December, 2009  . Carotid artery disease ([place])    , Doppler, 2003  . Cataract   . Cellulitis   . Chest pain    Catheterization, 2003, no significant CAD  . Diastolic dysfunction    Mild diastolic dysfunction, echo, December, 2012  . DJD (degenerative joint disease)   . Dyslipidemia   . Edema    November, 2012  . Ejection fraction    EF 60-70%, echo, December, 2009 / EF 55-60%, December, 2012  . GERD (gastroesophageal reflux disease)   . Hyperlipidemia   . Hypertension   . Mitral valve prolapse    Echo, 2009, very mild intermittent prolapse of the posterior leaflet, no MR  . Osteoporosis   . Tachycardia    Nighttime tachycardia, February, 2014  . Thyroid nodule     Social History   Socioeconomic  History  . Marital status: Married    Spouse name: Not on file  . Number of children: Not on file  . Years of education: Not on file  . Highest education level: Not on file  Social Needs  . Financial resource strain: Not on file  . Food insecurity - worry: Not on file  . Food insecurity - inability: Not on file  . Transportation needs - medical: Not on file  . Transportation needs - non-medical: Not on file  Occupational History  . Not on file  Tobacco Use  . Smoking status: Never Smoker  . Smokeless tobacco: Never Used  Substance and Sexual Activity  . Alcohol use: No    Alcohol/week: 0.0 oz  . Drug use: No  . Sexual activity: No  Other Topics Concern  . Not on file  Social History Narrative  . Not on  file     Family History  Problem Relation Age of Onset  . Heart attack Mother   . Cancer Mother        THROAT / VOCAL CORD  . Hypertension Mother   . Hypertension Father   . Bone cancer Sister   . Stroke Sister   . Lung cancer Grandchild   . Hypertension Sister   . Metabolic syndrome Sister        pre diabetes  . GER disease Sister   . Heart disease Sister        CAD  . Heart attack Brother   . Cirrhosis Brother   . Cancer Sister        BREAST / BONE  . Hyperlipidemia Sister   . Heart attack Sister      Review of Systems: General: negative for chills, fever, night sweats or weight changes.  Cardiovascular: negative for chest pain, dyspnea on exertion, edema, orthopnea, paroxysmal nocturnal dyspnea or shortness of breath Dermatological: negative for rash Respiratory: negative for cough or wheezing Urologic: negative for hematuria Abdominal: negative for nausea, vomiting, diarrhea, bright red blood per rectum, melena, or hematemesis Neurologic: negative for visual changes, syncope, or dizziness All other systems reviewed and are otherwise negative except as noted above.    Blood pressure 140/72, pulse (!) 56, height 5' (1.524 m), weight 142 lb 12.8 oz (64.8 kg).  General appearance: alert, cooperative and no distress Neck: no carotid bruit and no JVD Lungs: clear to auscultation bilaterally and kyphosis Heart: regular rate and rhythm and 2/6 systolic murmur, preserved S2 Extremities: no edema Skin: Skin color, texture, turgor normal. No rashes or lesions Neurologic: Grossly normal  EKG NSR, SB-56  ASSESSMENT AND PLAN:   Palpitations Pt c/o of hearing her heart beat in her head 24 hours a day. She also has palpitations at night (chronic and takes lopressor prn in addition to Toprol 100 mg)  Mild aortic stenosis Will check echo to r/o progression  Carotid artery disease ([place]) Mild plaque March 2017- no f/u recomended  Essential hypertension Stable  Normal  coronary arteries Cath 2003, negative Myoview in 2010  Dyslipidemia Followed by PCP   PLAN  I reassured Melissa Carpenter that hearing her heart beat is probably not indicative of a serious heart problem. It may be exacerbated by having a myringotomy tube in her Rt ear. We'll check an echo to r/o progression of her AS. She didn't have significant carotid disease in March 2017. There is no room to increase her beta blocker and it sounds like her palpitations are chronic and stable.   Kerin Ransom  PA-C 06/20/2017 8:51 AM

## 2017-06-20 NOTE — Patient Instructions (Signed)
Medication Instructions:  NO CHANGES If you need a refill on your cardiac medications before your next appointment, please call your pharmacy.  Testing/Procedures: Your physician has requested that you have an echocardiogram. Echocardiography is a painless test that uses sound waves to create images of your heart. It provides your doctor with information about the size and shape of your heart and how well your heart's chambers and valves are working. This procedure takes approximately one hour. There are no restrictions for this procedure.  Follow-Up: Your physician wants you to follow-up in: [place].   Thank you for choosing [facility] at [facility]!!

## 2017-06-20 NOTE — Assessment & Plan Note (Signed)
Stable

## 2017-06-20 NOTE — Assessment & Plan Note (Signed)
Will check echo to r/o progression

## 2017-06-20 NOTE — Assessment & Plan Note (Addendum)
Pt c/o of hearing her heart beat in her head 24 hours a day. She also has palpitations at night (chronic and takes lopressor prn in addition to Toprol 100 mg)

## 2017-06-20 NOTE — Assessment & Plan Note (Signed)
Cath 2003, negative Myoview in 2010

## 2017-06-20 NOTE — Assessment & Plan Note (Signed)
Followed by PCP

## 2017-06-20 NOTE — Assessment & Plan Note (Signed)
Mild plaque March 2017- no f/u recomended

## 2017-06-28 ENCOUNTER — Ambulatory Visit (HOSPITAL_COMMUNITY): Payer: Medicare Other | Attending: Cardiology

## 2017-06-28 ENCOUNTER — Other Ambulatory Visit: Payer: Self-pay

## 2017-06-28 DIAGNOSIS — Z0389 Encounter for observation for other suspected diseases and conditions ruled out: Secondary | ICD-10-CM | POA: Diagnosis not present

## 2017-06-28 DIAGNOSIS — R7303 Prediabetes: Secondary | ICD-10-CM | POA: Diagnosis not present

## 2017-06-28 DIAGNOSIS — I350 Nonrheumatic aortic (valve) stenosis: Secondary | ICD-10-CM | POA: Diagnosis not present

## 2017-06-28 DIAGNOSIS — I2510 Atherosclerotic heart disease of native coronary artery without angina pectoris: Secondary | ICD-10-CM | POA: Insufficient documentation

## 2017-06-28 DIAGNOSIS — I872 Venous insufficiency (chronic) (peripheral): Secondary | ICD-10-CM | POA: Diagnosis not present

## 2017-06-28 DIAGNOSIS — I080 Rheumatic disorders of both mitral and aortic valves: Secondary | ICD-10-CM | POA: Diagnosis not present

## 2017-06-28 DIAGNOSIS — I119 Hypertensive heart disease without heart failure: Secondary | ICD-10-CM | POA: Insufficient documentation

## 2017-06-28 DIAGNOSIS — IMO0001 Reserved for inherently not codable concepts without codable children: Secondary | ICD-10-CM

## 2017-07-03 ENCOUNTER — Other Ambulatory Visit: Payer: Medicare Other

## 2017-07-03 DIAGNOSIS — E7800 Pure hypercholesterolemia, unspecified: Secondary | ICD-10-CM | POA: Diagnosis not present

## 2017-07-03 DIAGNOSIS — E559 Vitamin D deficiency, unspecified: Secondary | ICD-10-CM | POA: Diagnosis not present

## 2017-07-03 DIAGNOSIS — I10 Essential (primary) hypertension: Secondary | ICD-10-CM | POA: Diagnosis not present

## 2017-07-03 DIAGNOSIS — K219 Gastro-esophageal reflux disease without esophagitis: Secondary | ICD-10-CM

## 2017-07-03 DIAGNOSIS — R7303 Prediabetes: Secondary | ICD-10-CM

## 2017-07-04 LAB — BMP8+EGFR
BUN/Creatinine Ratio: 24 (ref 12–28)
BUN: 14 mg/dL (ref 8–27)
CALCIUM: 9.5 mg/dL (ref 8.7–10.3)
CHLORIDE: 104 mmol/L (ref 96–106)
CO2: 23 mmol/L (ref 20–29)
Creatinine, Ser: 0.59 mg/dL (ref 0.57–1.00)
GFR calc non Af Amer: 86 mL/min/{1.73_m2} (ref >59)
GFR, EST AFRICAN AMERICAN: 99 mL/min/{1.73_m2} (ref >59)
GLUCOSE: 103 mg/dL — AB (ref 65–99)
POTASSIUM: 4.1 mmol/L (ref 3.5–5.2)
Sodium: 143 mmol/L (ref 134–144)

## 2017-07-04 LAB — CBC WITH DIFFERENTIAL/PLATELET
BASOS ABS: 0 10*3/uL (ref 0.0–0.2)
BASOS: 0 %
EOS (ABSOLUTE): 0.2 10*3/uL (ref 0.0–0.4)
Eos: 3 %
Hematocrit: 37.9 % (ref 34.0–46.6)
Hemoglobin: 12.8 g/dL (ref 11.1–15.9)
IMMATURE GRANULOCYTES: 0 %
Immature Grans (Abs): 0 10*3/uL (ref 0.0–0.1)
LYMPHS: 33 %
Lymphocytes Absolute: 1.5 10*3/uL (ref 0.7–3.1)
MCH: 30.9 pg (ref 26.6–33.0)
MCHC: 33.8 g/dL (ref 31.5–35.7)
MCV: 92 fL (ref 79–97)
MONOS ABS: 0.5 10*3/uL (ref 0.1–0.9)
Monocytes: 11 %
NEUTROS PCT: 53 %
Neutrophils Absolute: 2.3 10*3/uL (ref 1.4–7.0)
PLATELETS: 218 10*3/uL (ref 150–379)
RBC: 4.14 x10E6/uL (ref 3.77–5.28)
RDW: 13.7 % (ref 12.3–15.4)
WBC: 4.5 10*3/uL (ref 3.4–10.8)

## 2017-07-04 LAB — LIPID PANEL
CHOL/HDL RATIO: 3.3 ratio (ref 0.0–4.4)
CHOLESTEROL TOTAL: 144 mg/dL (ref 100–199)
HDL: 43 mg/dL (ref >39)
LDL CALC: 68 mg/dL (ref 0–99)
Triglycerides: 167 mg/dL — ABNORMAL HIGH (ref 0–149)
VLDL CHOLESTEROL CAL: 33 mg/dL (ref 5–40)

## 2017-07-04 LAB — VITAMIN D 25 HYDROXY (VIT D DEFICIENCY, FRACTURES): Vit D, 25-Hydroxy: 56.1 ng/mL (ref 30.0–100.0)

## 2017-07-04 LAB — HEPATIC FUNCTION PANEL
ALK PHOS: 94 IU/L (ref 39–117)
ALT: 29 IU/L (ref 0–32)
AST: 31 IU/L (ref 0–40)
Albumin: 4.6 g/dL (ref 3.5–4.7)
Bilirubin Total: 0.4 mg/dL (ref 0.0–1.2)
Bilirubin, Direct: 0.09 mg/dL (ref 0.00–0.40)
TOTAL PROTEIN: 7 g/dL (ref 6.0–8.5)

## 2017-07-13 ENCOUNTER — Encounter: Payer: Self-pay | Admitting: Family Medicine

## 2017-07-13 ENCOUNTER — Ambulatory Visit (INDEPENDENT_AMBULATORY_CARE_PROVIDER_SITE_OTHER): Payer: Medicare Other | Admitting: Family Medicine

## 2017-07-13 VITALS — BP 138/62 | HR 65 | Temp 97.1°F | Ht 60.0 in | Wt 143.0 lb

## 2017-07-13 DIAGNOSIS — M1611 Unilateral primary osteoarthritis, right hip: Secondary | ICD-10-CM

## 2017-07-13 DIAGNOSIS — M25551 Pain in right hip: Secondary | ICD-10-CM

## 2017-07-13 DIAGNOSIS — R7303 Prediabetes: Secondary | ICD-10-CM | POA: Diagnosis not present

## 2017-07-13 DIAGNOSIS — R5383 Other fatigue: Secondary | ICD-10-CM

## 2017-07-13 DIAGNOSIS — I6523 Occlusion and stenosis of bilateral carotid arteries: Secondary | ICD-10-CM

## 2017-07-13 DIAGNOSIS — E7800 Pure hypercholesterolemia, unspecified: Secondary | ICD-10-CM | POA: Diagnosis not present

## 2017-07-13 DIAGNOSIS — K219 Gastro-esophageal reflux disease without esophagitis: Secondary | ICD-10-CM

## 2017-07-13 DIAGNOSIS — E559 Vitamin D deficiency, unspecified: Secondary | ICD-10-CM | POA: Diagnosis not present

## 2017-07-13 DIAGNOSIS — I10 Essential (primary) hypertension: Secondary | ICD-10-CM

## 2017-07-13 LAB — BAYER DCA HB A1C WAIVED: HB A1C: 6.2 % (ref <7.0)

## 2017-07-13 NOTE — Patient Instructions (Addendum)
Medicare Annual Wellness Visit  [facility] and the medical providers at [facility] strive to bring you the best medical care.  In doing so we not only want to address your current medical conditions and concerns but also to detect new conditions early and prevent illness, disease and health-related problems.    Medicare offers a yearly Wellness Visit which allows our clinical staff to assess your need for preventative services including immunizations, lifestyle education, counseling to decrease risk of preventable diseases and screening for fall risk and other medical concerns.    This visit is provided free of charge (no copay) for all Medicare recipients. The clinical pharmacists at [facility] have begun to conduct these Wellness Visits which will also include a thorough review of all your medications.    As you primary medical provider recommend that you make an appointment for your Annual Wellness Visit if you have not done so already this year.  You may set up this appointment before you leave today or you may call back ([phone]) and schedule an appointment.  Please make sure when you call that you mention that you are scheduling your Annual Wellness Visit with the clinical pharmacist so that the appointment may be made for the proper length of time.     Continue current medications. Continue good therapeutic lifestyle changes which include good diet and exercise. Fall precautions discussed with patient. If an FOBT was given today- please return it to our front desk. If you are over 30 years old - you may need Prevnar 87 or the adult Pneumonia vaccine.  **Flu shots are available--- please call and schedule a FLU-CLINIC appointment**  After your visit with [place] today you will receive a survey in the mail or online from [employer] regarding your care with [place]. Please take a moment to fill this out. Your feedback is very  important to [place] as you can help [place] better understand your patient needs as well as improve your experience and satisfaction. WE CARE ABOUT YOU!!!   Try MiraLAX more regularly Continue to drink plenty of fluids especially water and stay well-hydrated Always be careful do not put yourself at risk for falling Shot that you received today could potentially make her blood sugars go up for short period of time so you need to be aware of that. Continue with as aggressive therapeutic lifestyle changes as possible

## 2017-07-13 NOTE — Progress Notes (Signed)
Subjective:    Patient ID: Melissa Carpenter, female    DOB: [birth year], 83 y.o.   MRN: [id number]  HPI Pt here for follow up and management of chronic medical problems which includes hypertension and hyperlipidemia. He is taking medication regularly.  The patient is doing well overall but does complain with some right hip pain.  Get a pelvic exam and will schedule this with Tangela-Margaret she will also need to get a mammogram.  She will be given an FOBT to return.  She has had lab work done and this will be reviewed with her during the visit today.  A traditional lipid panel had elevated triglycerides but normal LDL C.  This seems to run in her family and the patient has had problems with this in the past she is better but still elevated with the triglycerides at 167.  The CBC was within normal limits with a good hemoglobin normal white blood cell count and adequate platelet count.  The blood sugar slightly elevated at 103 and the creatinine and electrolytes were good.  The vitamin D level was good at 56.1.  All liver function tests were within normal limits.  The patient brings in blood sugars for review and overall these are excellent.  Blood pressures are running in the 120s up to the low 130s on the average.  And this includes the past 4 months.  They will be scanned into the record.  The patient comes to the visit today with her husband.  She has had a lot of stress on her but seems to be handling it fairly well.  Her husband has prostate cancer and her sister just lost her husband.  Patient denies any chest pain or shortness of breath.  She denies any trouble with her intestinal system including nausea vomiting diarrhea blood in the stool or black tarry bowel movements.  She has ongoing problems with constipation.  She says she drinks plenty of water.  She has not seen any blood in the stool or black tarry bowel movements.  She is passing her water without problems.  She has had problems with her right hip for  a good while and has received injections in the past for this.  She would like to get another injection today.     Patient Active Problem List   Diagnosis Date Noted  . Normal coronary arteries 06/20/2017  . Palpitations 02/21/2017  . Mild aortic stenosis 02/21/2017  . Pre-diabetes 02/01/2016  . Chronic venous insufficiency 09/14/2015  . Metabolic syndrome 80/32/1224  . Gastroesophageal reflux disease with hiatal hernia 07/21/2013  . Osteoporosis 03/05/2013  . Edema   . Carotid artery disease ([place])   . Dyslipidemia   . Mitral valve prolapse   . Ejection fraction   . Aortic insufficiency   . Essential hypertension   . Chest pain    Outpatient Encounter Medications as of 07/13/2017  Medication Sig  . amLODipine-atorvastatin (CADUET) 10-40 MG tablet TAKE 1 TABLET BY MOUTH DAILY.  Marland Kitchen aspirin 81 MG tablet Take 81 mg by mouth daily.  . blood glucose meter kit and supplies KIT Dispense based on patient and insurance preference. Use daily as directed.  . Calcium Carbonate-Vitamin D (CALTRATE 600+D) 600-400 MG-UNIT per tablet Take 1 tablet by mouth daily.  . cholecalciferol (VITAMIN D) 400 UNITS TABS Take 1,000 Units by mouth.    . fish oil-omega-3 fatty acids 1000 MG capsule Take 1 g by mouth daily.    . hydrocortisone 2.5 % cream Apply  topically 2 (two) times daily.  . metoprolol succinate (TOPROL-XL) 100 MG 24 hr tablet TAKE 1 TABLET (100 MG TOTAL) BY MOUTH DAILY. TAKE WITH OR IMMEDIATELY FOLLOWING A MEAL.  . metoprolol tartrate (LOPRESSOR) 25 MG tablet Take 1 tablet (25 mg total) by mouth as needed.  . Multiple Vitamin (MULTIVITAMIN) tablet Take 1 tablet by mouth daily.    Marland Kitchen olmesartan (BENICAR) 40 MG tablet TAKE 1 TABLET BY MOUTH EVERY DAY   No facility-administered encounter medications on file as of 07/13/2017.      Review of Systems  Constitutional: Negative.   HENT: Negative.   Eyes: Negative.   Respiratory: Negative.   Cardiovascular: Negative.   Gastrointestinal:  Negative.   Endocrine: Negative.   Genitourinary: Negative.   Musculoskeletal: Positive for arthralgias (right hip pain).  Skin: Negative.   Allergic/Immunologic: Negative.   Neurological: Negative.   Hematological: Negative.   Psychiatric/Behavioral: Negative.        Objective:   Physical Exam  Constitutional: She is oriented to person, place, and time. She appears well-developed and well-nourished.  The patient is pleasant and alert and calm.  HENT:  Head: Normocephalic and atraumatic.  Left Ear: External ear normal.  Nose: Nose normal.  Mouth/Throat: Oropharynx is clear and moist.  The patient still has a myringotomy tube in the right TM.  Eyes: Conjunctivae and EOM are normal. Pupils are equal, round, and reactive to light. Right eye exhibits no discharge. Left eye exhibits no discharge. No scleral icterus.  Neck: Normal range of motion. Neck supple. No thyromegaly present.  No  thyromegaly or anterior cervical adenopathy, were bilateral carotid bruits.  Cardiovascular: Normal rate, regular rhythm and intact distal pulses.  Murmur heard. Grade 2/6 systolic ejection murmur with a rate of 60/min  Pulmonary/Chest: Effort normal and breath sounds normal. No respiratory distress. She has no wheezes. She has no rales.  Clear anteriorly and posteriorly  Abdominal: Soft. Bowel sounds are normal. She exhibits no mass. There is no tenderness. There is no rebound and no guarding.  Slight right lower quadrant abdominal tenderness.  No liver or spleen enlargement no masses and no bruits detected.  Musculoskeletal: She exhibits tenderness. She exhibits no edema.  Some limited range of motion because of hip pain and arthritis.  60 of Depo-Medrol with 1 cc of Marcaine was injected in the trigger point to the right hip joint after cleansing with Betadine solution the patient tolerated the procedure well.  Lymphadenopathy:    She has no cervical adenopathy.  Neurological: She is alert and  oriented to person, place, and time. She has normal reflexes. No cranial nerve deficit.  Skin: Skin is warm and dry. No rash noted.  Psychiatric: She has a normal mood and affect. Her behavior is normal. Judgment and thought content normal.  Nursing note and vitals reviewed.  BP 138/62 (BP Location: Left Arm)   Pulse 65   Temp (!) 97.1 F (36.2 C) (Oral)   Ht 5' (1.524 m)   Wt 143 lb (64.9 kg)   BMI 27.93 kg/m         Assessment & Plan:  1. Essential hypertension -The blood pressure is good today and she will continue with current treatment  2. Pure hypercholesterolemia -All cholesterol numbers were good with her lab work except for the triglyceride which remains elevated but lower than it was in the past and she will continue to work with her diet and exercise regimen to reduce this further.  She will continue with the  atorvastatin part of her blood pressure medicine.  3. Vitamin D deficiency -Continue with vitamin D replacement  4. Gastroesophageal reflux disease, esophagitis presence not specified -No complaints with reflux today.  5. Pre-diabetes -Continue with aggressive therapeutic lifestyle changes with A1c pending as it was not done with the original blood work. - Bayer DCA Hb A1c Waived  6. Fatigue, unspecified type - Thyroid Panel With TSH  7. Right hip pain -60 of Depo-Medrol with 1 cc of Marcaine injected to trigger point of right hip  8. Arthritis of right hip -Steroid Marcaine injection right hip patient tolerated procedure well  Patient Instructions                       Medicare Annual Wellness Visit  [facility] and the medical providers at [facility] strive to bring you the best medical care.  In doing so we not only want to address your current medical conditions and concerns but also to detect new conditions early and prevent illness, disease and health-related problems.    Medicare offers a yearly Wellness Visit which  allows our clinical staff to assess your need for preventative services including immunizations, lifestyle education, counseling to decrease risk of preventable diseases and screening for fall risk and other medical concerns.    This visit is provided free of charge (no copay) for all Medicare recipients. The clinical pharmacists at [facility] have begun to conduct these Wellness Visits which will also include a thorough review of all your medications.    As you primary medical provider recommend that you make an appointment for your Annual Wellness Visit if you have not done so already this year.  You may set up this appointment before you leave today or you may call back ([phone]) and schedule an appointment.  Please make sure when you call that you mention that you are scheduling your Annual Wellness Visit with the clinical pharmacist so that the appointment may be made for the proper length of time.     Continue current medications. Continue good therapeutic lifestyle changes which include good diet and exercise. Fall precautions discussed with patient. If an FOBT was given today- please return it to our front desk. If you are over 43 years old - you may need Prevnar 80 or the adult Pneumonia vaccine.  **Flu shots are available--- please call and schedule a FLU-CLINIC appointment**  After your visit with [place] today you will receive a survey in the mail or online from [employer] regarding your care with [place]. Please take a moment to fill this out. Your feedback is very important to [place] as you can help [place] better understand your patient needs as well as improve your experience and satisfaction. WE CARE ABOUT YOU!!!   Try MiraLAX more regularly Continue to drink plenty of fluids especially water and stay well-hydrated Always be careful do not put yourself at risk for falling Shot that you received today could potentially make her blood sugars go up for short period of time so  you need to be aware of that. Continue with as aggressive therapeutic lifestyle changes as possible  Arrie Senate MD

## 2017-07-14 LAB — THYROID PANEL WITH TSH
FREE THYROXINE INDEX: 1.9 (ref 1.2–4.9)
T3 UPTAKE RATIO: 27 % (ref 24–39)
T4 TOTAL: 7.2 ug/dL (ref 4.5–12.0)
TSH: 1.77 u[IU]/mL (ref 0.450–4.500)

## 2017-07-17 ENCOUNTER — Other Ambulatory Visit: Payer: Self-pay | Admitting: Family Medicine

## 2017-07-18 ENCOUNTER — Ambulatory Visit (INDEPENDENT_AMBULATORY_CARE_PROVIDER_SITE_OTHER): Payer: Medicare Other | Admitting: Nurse Practitioner

## 2017-07-18 ENCOUNTER — Encounter: Payer: Self-pay | Admitting: Nurse Practitioner

## 2017-07-18 VITALS — BP 153/72 | HR 62 | Temp 97.1°F | Ht 61.0 in | Wt 141.6 lb

## 2017-07-18 DIAGNOSIS — Z1211 Encounter for screening for malignant neoplasm of colon: Secondary | ICD-10-CM | POA: Diagnosis not present

## 2017-07-18 DIAGNOSIS — Z01419 Encounter for gynecological examination (general) (routine) without abnormal findings: Secondary | ICD-10-CM

## 2017-07-18 NOTE — Addendum Note (Signed)
Addended by: Zannie Cove on: 07/18/2017 04:05 PM   Modules accepted: Orders

## 2017-07-18 NOTE — Progress Notes (Signed)
   Subjective:    Patient ID: Melissa Carpenter, female    DOB: 08-05-1934, 83 y.o.   MRN: [id number]  HPI Ms. Pickney is a regular patient of Dr. Laurance Flatten that was seen for chronic follow up on 07/13/17. She is here today for pelvic exam only. She says she is doing well today without complaints.    Review of Systems  Constitutional: Negative for activity change and appetite change.  HENT: Negative.   Eyes: Negative for pain.  Respiratory: Negative for shortness of breath.   Cardiovascular: Negative for chest pain, palpitations and leg swelling.  Gastrointestinal: Negative for abdominal pain.  Endocrine: Negative for polydipsia.  Genitourinary: Negative.   Skin: Negative for rash.  Neurological: Negative for dizziness, weakness and headaches.  Hematological: Does not bruise/bleed easily.  Psychiatric/Behavioral: Negative.   All other systems reviewed and are negative.      Objective:   Physical Exam  Constitutional: She is oriented to person, place, and time. She appears well-developed and well-nourished.  HENT:  Head: Normocephalic.  Right Ear: Hearing, tympanic membrane, external ear and ear canal normal.  Left Ear: Hearing, tympanic membrane, external ear and ear canal normal.  Nose: Nose normal.  Mouth/Throat: Uvula is midline and oropharynx is clear and moist.  Eyes: Conjunctivae and EOM are normal. Pupils are equal, round, and reactive to light.  Neck: Trachea normal, normal range of motion and full passive range of motion without pain. Neck supple. No JVD present. Carotid bruit is not present. No thyroid mass and no thyromegaly present.  Cardiovascular: Normal rate, regular rhythm, normal heart sounds and intact distal pulses. Exam reveals no gallop and no friction rub.  No murmur heard. Pulmonary/Chest: Effort normal and breath sounds normal. Right breast exhibits no inverted nipple, no mass, no nipple discharge, no skin change and no tenderness. Left breast exhibits no inverted  nipple, no mass, no nipple discharge, no skin change and no tenderness.  Abdominal: Soft. Bowel sounds are normal. She exhibits no distension and no mass. There is no tenderness.  Genitourinary: Vagina normal and uterus normal. No breast swelling, tenderness, discharge or bleeding.  Genitourinary Comments: bimanual exam-No adnexal masses or tenderness. Vaginal cuff intact Small cystocele and rectocele   Musculoskeletal: Normal range of motion.  Lymphadenopathy:    She has no cervical adenopathy.  Neurological: She is alert and oriented to person, place, and time. She has normal reflexes.  Skin: Skin is warm and dry.  Psychiatric: She has a normal mood and affect. Her behavior is normal. Judgment and thought content normal.   BP (!) 153/72   Pulse 62   Temp (!) 97.1 F (36.2 C) (Oral)   Ht 5\' 1"  (1.549 m)   Wt 141 lb 9.6 oz (64.2 kg)   BMI 26.76 kg/m       Assessment & Plan:   1. Visit for pelvic exam    Keep follow up appointment with Dr. Mayra Neer, FNP

## 2017-07-19 ENCOUNTER — Other Ambulatory Visit: Payer: Self-pay | Admitting: Family Medicine

## 2017-07-24 LAB — FECAL OCCULT BLOOD, IMMUNOCHEMICAL: Fecal Occult Bld: POSITIVE — AB

## 2017-07-25 ENCOUNTER — Other Ambulatory Visit: Payer: Self-pay | Admitting: *Deleted

## 2017-07-25 DIAGNOSIS — K921 Melena: Secondary | ICD-10-CM

## 2017-07-30 ENCOUNTER — Other Ambulatory Visit: Payer: Medicare Other

## 2017-07-30 DIAGNOSIS — K921 Melena: Secondary | ICD-10-CM | POA: Diagnosis not present

## 2017-07-31 ENCOUNTER — Telehealth: Payer: Self-pay | Admitting: Family Medicine

## 2017-07-31 LAB — CBC WITH DIFFERENTIAL/PLATELET
BASOS ABS: 0 10*3/uL (ref 0.0–0.2)
Basos: 0 %
EOS (ABSOLUTE): 0.2 10*3/uL (ref 0.0–0.4)
EOS: 2 %
HEMATOCRIT: 37.1 % (ref 34.0–46.6)
Hemoglobin: 13 g/dL (ref 11.1–15.9)
IMMATURE GRANULOCYTES: 0 %
Immature Grans (Abs): 0 10*3/uL (ref 0.0–0.1)
Lymphocytes Absolute: 2 10*3/uL (ref 0.7–3.1)
Lymphs: 28 %
MCH: 31.1 pg (ref 26.6–33.0)
MCHC: 35 g/dL (ref 31.5–35.7)
MCV: 89 fL (ref 79–97)
MONOS ABS: 0.6 10*3/uL (ref 0.1–0.9)
Monocytes: 9 %
NEUTROS PCT: 61 %
Neutrophils Absolute: 4.2 10*3/uL (ref 1.4–7.0)
PLATELETS: 214 10*3/uL (ref 150–379)
RBC: 4.18 x10E6/uL (ref 3.77–5.28)
RDW: 14.1 % (ref 12.3–15.4)
WBC: 7 10*3/uL (ref 3.4–10.8)

## 2017-07-31 NOTE — Telephone Encounter (Signed)
Pt aware of labs  

## 2017-08-01 ENCOUNTER — Other Ambulatory Visit: Payer: Medicare Other

## 2017-08-01 DIAGNOSIS — Z1211 Encounter for screening for malignant neoplasm of colon: Secondary | ICD-10-CM

## 2017-08-01 DIAGNOSIS — R195 Other fecal abnormalities: Secondary | ICD-10-CM

## 2017-08-09 LAB — FECAL OCCULT BLOOD, IMMUNOCHEMICAL: Fecal Occult Bld: POSITIVE — AB

## 2017-08-10 NOTE — Addendum Note (Signed)
Addended by: Nigel Berthold C on: 08/10/2017 10:43 AM   Modules accepted: Orders

## 2017-08-10 NOTE — Progress Notes (Signed)
Patient aware and verbalizes understanding and referral placed.

## 2017-08-16 ENCOUNTER — Encounter (INDEPENDENT_AMBULATORY_CARE_PROVIDER_SITE_OTHER): Payer: Self-pay

## 2017-08-16 ENCOUNTER — Encounter (INDEPENDENT_AMBULATORY_CARE_PROVIDER_SITE_OTHER): Payer: Self-pay | Admitting: Internal Medicine

## 2017-08-23 ENCOUNTER — Telehealth: Payer: Self-pay | Admitting: Family Medicine

## 2017-08-23 NOTE — Telephone Encounter (Signed)
Pt called - having kidney / back pain  -- -appt made

## 2017-08-27 ENCOUNTER — Other Ambulatory Visit: Payer: Self-pay | Admitting: *Deleted

## 2017-08-27 ENCOUNTER — Ambulatory Visit (INDEPENDENT_AMBULATORY_CARE_PROVIDER_SITE_OTHER): Payer: Medicare Other | Admitting: Internal Medicine

## 2017-08-27 MED ORDER — AMLODIPINE-ATORVASTATIN 10-40 MG PO TABS: 1.0000 | ORAL_TABLET | Freq: Every day | ORAL | 0 refills | Status: DC

## 2017-08-28 ENCOUNTER — Encounter: Payer: Self-pay | Admitting: Family Medicine

## 2017-08-28 ENCOUNTER — Ambulatory Visit (INDEPENDENT_AMBULATORY_CARE_PROVIDER_SITE_OTHER): Payer: Medicare Other | Admitting: Family Medicine

## 2017-08-28 ENCOUNTER — Ambulatory Visit (INDEPENDENT_AMBULATORY_CARE_PROVIDER_SITE_OTHER): Payer: Medicare Other

## 2017-08-28 ENCOUNTER — Encounter: Payer: Self-pay | Admitting: Physician Assistant

## 2017-08-28 VITALS — BP 139/62 | HR 64 | Temp 97.5°F | Ht 61.0 in | Wt 142.0 lb

## 2017-08-28 DIAGNOSIS — M5440 Lumbago with sciatica, unspecified side: Secondary | ICD-10-CM

## 2017-08-28 DIAGNOSIS — M542 Cervicalgia: Secondary | ICD-10-CM | POA: Diagnosis not present

## 2017-08-28 DIAGNOSIS — R195 Other fecal abnormalities: Secondary | ICD-10-CM

## 2017-08-28 DIAGNOSIS — M4004 Postural kyphosis, thoracic region: Secondary | ICD-10-CM

## 2017-08-28 DIAGNOSIS — M546 Pain in thoracic spine: Secondary | ICD-10-CM | POA: Diagnosis not present

## 2017-08-28 IMAGING — DX DG THORACIC SPINE 2V
3 series · 3 of 3 positions shown · non-contrast
Comparison: Chest x-ray dated [DATE]

CLINICAL DATA: Upper back pain.  No known injury.

EXAM:
THORACIC SPINE 2 VIEWS

[t-spine ap (1 of 2)]
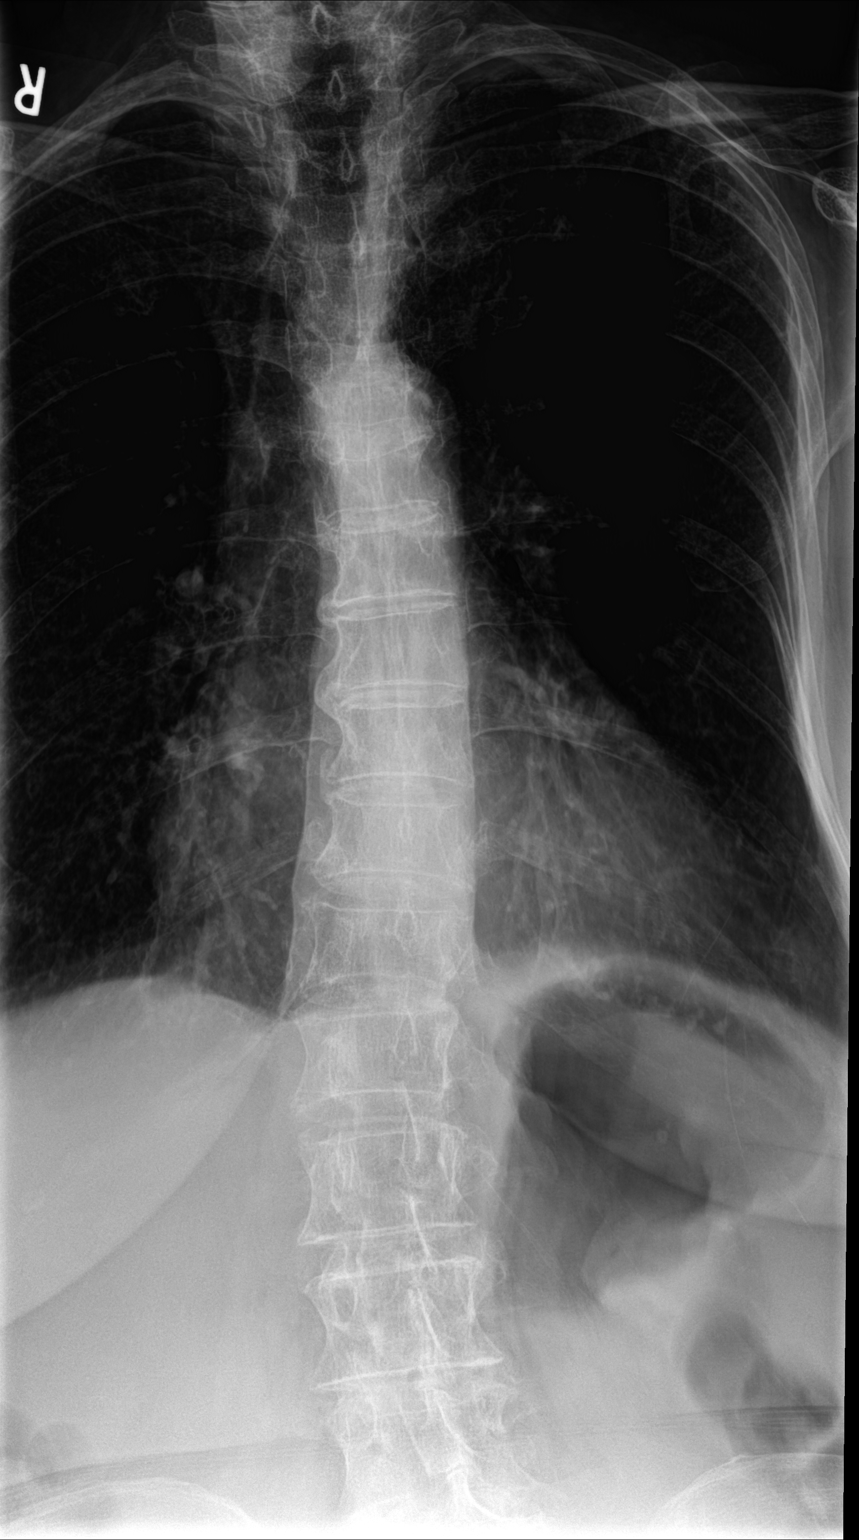

[t-spine lat]
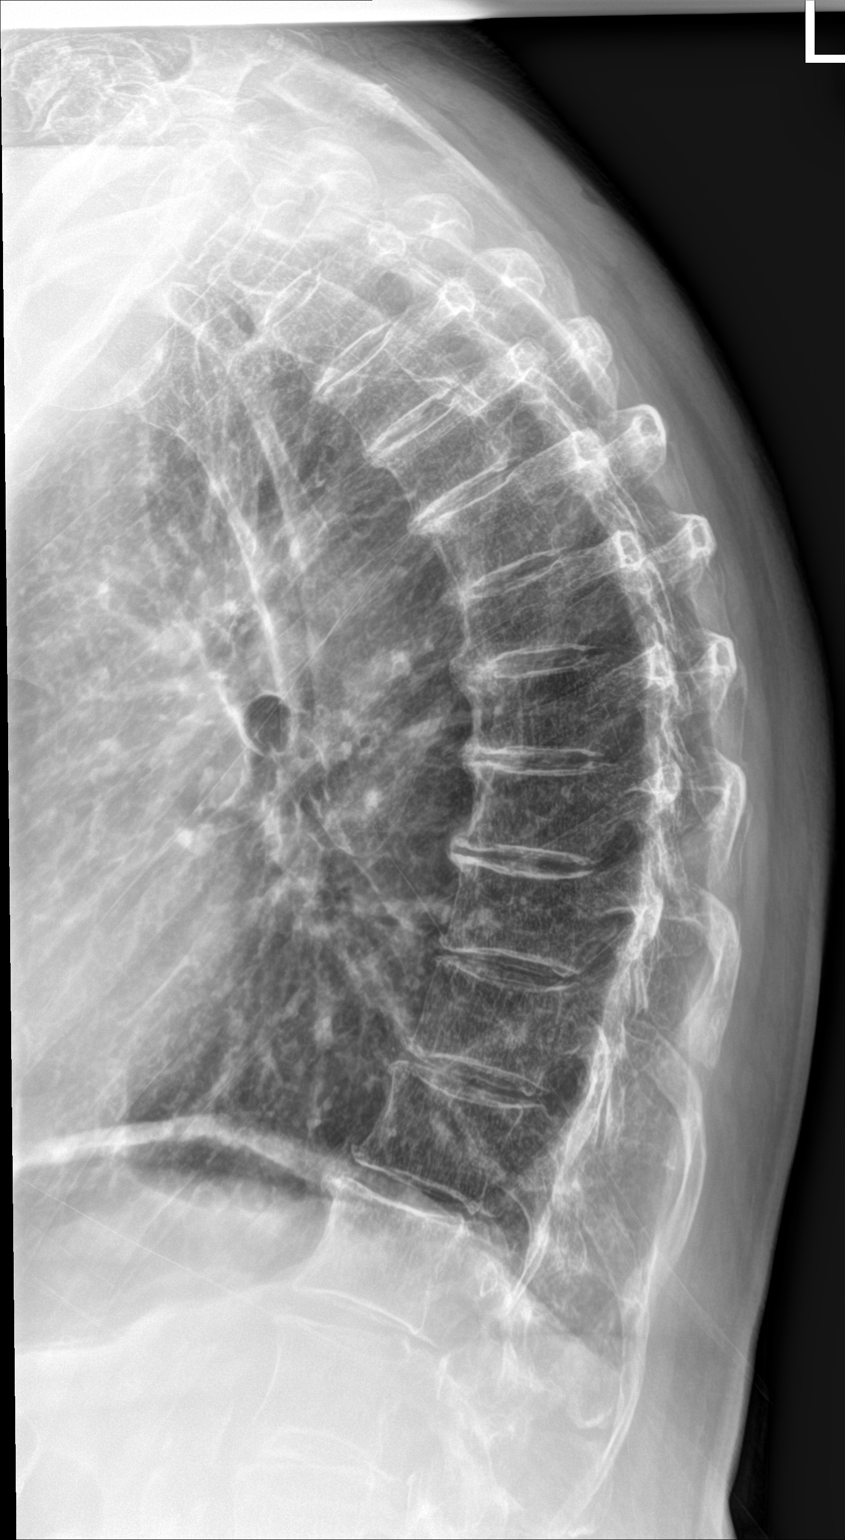

[t-spine ap (2 of 2)]
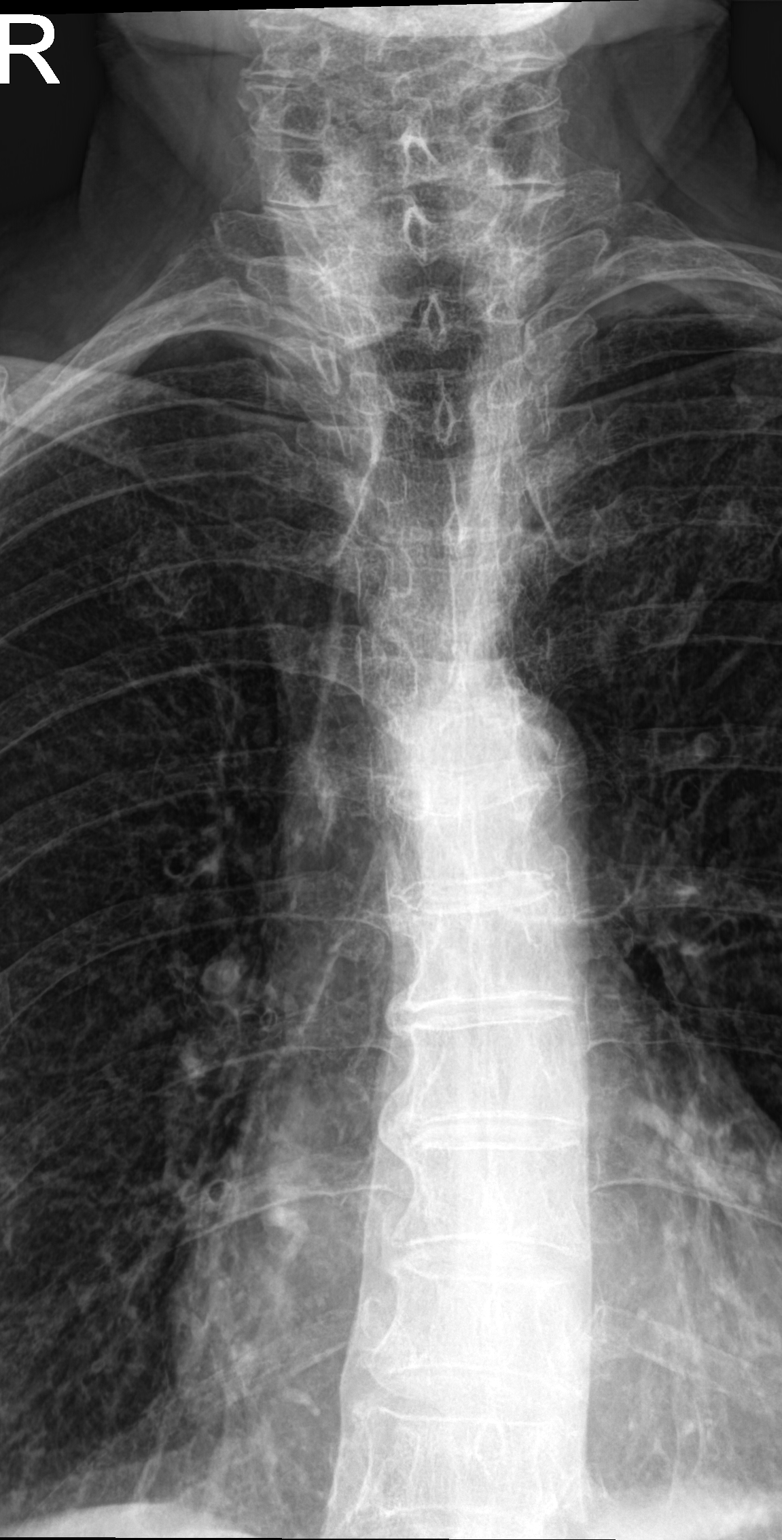

[3 of 3 positions shown; findings below may reference images not displayed]

FINDINGS: There is S-shaped scoliosis of the thoracolumbar spine, mild to
moderate in degree. There is also kyphosis of the thoracic spine
which appears stable compared to the lateral chest x-ray of
[DATE]. No evidence of acute vertebral body subluxation. No
fracture line or displaced fracture fragment. No acute or suspicious
osseous lesion.

Mild degenerative spurring within the upper and midthoracic spine.
No evidence of advanced degenerative change at any level. Visualized
paravertebral soft tissues are unremarkable.
IMPRESSION: 1. No acute findings.
2. Scoliosis and kyphosis.
3. Mild degenerative change.

## 2017-08-28 MED ORDER — METHYLPREDNISOLONE ACETATE 80 MG/ML IJ SUSP
60.0000 mg | Freq: Once | INTRAMUSCULAR | Status: AC
Start: 2017-08-28 — End: 2017-08-28
  Administered 2017-08-28: 60 mg via INTRAMUSCULAR

## 2017-08-28 NOTE — Progress Notes (Signed)
Subjective:    Patient ID: Melissa Carpenter, female    DOB: Nov 21, 1933, 84 y.o.   MRN: [id number]  HPI Patient here today for back pain. This started about 2 weeks ago.  The patient also brings up the fact that she has an appointment in [place] for positive stools for blood and she would prefer to go to [place].  We will make arrangements for that to happen in [place] instead of [place].  C-spine films and low back films from the past were reviewed which showed some osteoarthritis worse in the neck than the low back.  The pain she is having is basically between the 2 shoulder blades and it hurts to stand for long periods of time and also to arise from a supine position.  In reviewing her positive stools for blood, she has not seen any blood in the stool or had any black tarry bowel movements.  Her last colon screening was done by a physician in [place].  The x-rays of her chest cervical spine and lumbar spine were reviewed with her and she was given copies of this today.  We will go ahead and arrange for her to have thoracic spine films.  She is still hurting in her stomach some and this is been going on for a couple of weeks now.    Patient Active Problem List   Diagnosis Date Noted  . Normal coronary arteries 06/20/2017  . Palpitations 02/21/2017  . Mild aortic stenosis 02/21/2017  . Pre-diabetes 02/01/2016  . Chronic venous insufficiency 09/14/2015  . Metabolic syndrome 12/45/8099  . Gastroesophageal reflux disease with hiatal hernia 07/21/2013  . Osteoporosis 03/05/2013  . Edema   . Carotid artery disease ([place])   . Dyslipidemia   . Mitral valve prolapse   . Ejection fraction   . Aortic insufficiency   . Essential hypertension   . Chest pain    Outpatient Encounter Medications as of 08/28/2017  Medication Sig  . amLODipine-atorvastatin (CADUET) 10-40 MG tablet Take 1 tablet by mouth daily.  Marland Kitchen aspirin 81 MG tablet Take 81 mg by mouth daily.  . blood glucose meter kit  and supplies KIT Dispense based on patient and insurance preference. Use daily as directed.  . Calcium Carbonate-Vitamin D (CALTRATE 600+D) 600-400 MG-UNIT per tablet Take 1 tablet by mouth daily.  . cholecalciferol (VITAMIN D) 400 UNITS TABS Take 1,000 Units by mouth.    . fish oil-omega-3 fatty acids 1000 MG capsule Take 1 g by mouth daily.    . hydrocortisone 2.5 % cream Apply topically 2 (two) times daily.  . metoprolol succinate (TOPROL-XL) 100 MG 24 hr tablet TAKE 1 TABLET (100 MG TOTAL) BY MOUTH DAILY. TAKE WITH OR IMMEDIATELY FOLLOWING A MEAL.  . metoprolol tartrate (LOPRESSOR) 25 MG tablet Take 1 tablet (25 mg total) by mouth as needed.  . Multiple Vitamin (MULTIVITAMIN) tablet Take 1 tablet by mouth daily.    Marland Kitchen olmesartan (BENICAR) 40 MG tablet TAKE 1 TABLET BY MOUTH EVERY DAY  . ONETOUCH VERIO test strip USE AS DIRECTED DAILY   No facility-administered encounter medications on file as of 08/28/2017.       Review of Systems  Constitutional: Negative.   HENT: Negative.   Eyes: Negative.   Respiratory: Negative.   Cardiovascular: Negative.   Gastrointestinal: Negative.   Endocrine: Negative.   Genitourinary: Negative.   Musculoskeletal: Positive for back pain.  Skin: Negative.   Allergic/Immunologic: Negative.   Neurological: Negative.   Hematological: Negative.   Psychiatric/Behavioral: Negative.  Objective:   Physical Exam  Constitutional: She is oriented to person, place, and time. She appears well-developed and well-nourished. No distress.  HENT:  Head: Normocephalic and atraumatic.  Eyes: Conjunctivae and EOM are normal. Pupils are equal, round, and reactive to light. Right eye exhibits no discharge. Left eye exhibits no discharge. No scleral icterus.  Neck: Normal range of motion.  Cardiovascular: Normal rate and regular rhythm.  Murmur heard. Pulmonary/Chest: Effort normal and breath sounds normal. No respiratory distress. She has no wheezes. She has no  rales.  Abdominal: Soft. Bowel sounds are normal. She exhibits no mass. There is tenderness. There is no rebound and no guarding.  Musculoskeletal: Normal range of motion. She exhibits tenderness and deformity. She exhibits no edema.  Tenderness in the mid to lower thoracic spine area without any rash.  Kyphosis  Neurological: She is alert and oriented to person, place, and time.  Skin: Skin is warm and dry. No rash noted.  Psychiatric: She has a normal mood and affect. Her behavior is normal. Judgment and thought content normal.  Nursing note and vitals reviewed.   BP 139/62 (BP Location: Left Arm) Comment (BP Location): left ar  Pulse 64   Temp (!) 97.5 F (36.4 C) (Oral)   Ht '5\' 1"'  (1.549 m)   Wt 142 lb (64.4 kg)   BMI 26.83 kg/m   Thoracic spine films with results pending===     Assessment & Plan:  1. Neck pain -Most likely osteoarthritic in nature after reviewing previous films from over a year ago.  2. Bilateral low back pain with sciatica, sciatica laterality unspecified, unspecified chronicity -These were basically normal films.  3. Postural kyphosis of thoracic region -Thoracic spine films today -Take extra strength Tylenol as needed for pain  4.  Positive FOBT -Arrange for appointment in [place] with gastroenterologist there to further evaluate positive stools for blood and abdominal pain -Avoid NSAIDs -Only take extra strength Tylenol as needed for pain  No orders of the defined types were placed in this encounter.  Patient Instructions  We will re-arrange appointment with gastroenterology to [facility] We will call with the results of the thoracic spine films as soon as they become available In the meantime, take extra strength Tylenol as needed for pain which I believe is coming from osteoarthritis and a kyphotic posture.  Arrie Senate MD

## 2017-08-28 NOTE — Addendum Note (Signed)
Addended by: Zannie Cove on: 08/28/2017 02:21 PM   Modules accepted: Orders

## 2017-08-28 NOTE — Patient Instructions (Addendum)
We will re-arrange appointment with gastroenterology to [facility] We will call with the results of the thoracic spine films as soon as they become available In the meantime, take extra strength Tylenol as needed for pain which I believe is coming from osteoarthritis and a kyphotic posture.

## 2017-08-28 NOTE — Addendum Note (Signed)
Addended by: Zannie Cove on: 08/28/2017 02:13 PM   Modules accepted: Orders

## 2017-08-30 ENCOUNTER — Other Ambulatory Visit: Payer: Self-pay | Admitting: Family Medicine

## 2017-09-04 ENCOUNTER — Ambulatory Visit (INDEPENDENT_AMBULATORY_CARE_PROVIDER_SITE_OTHER): Payer: Medicare Other | Admitting: Internal Medicine

## 2017-09-05 ENCOUNTER — Ambulatory Visit (INDEPENDENT_AMBULATORY_CARE_PROVIDER_SITE_OTHER): Payer: Medicare Other | Admitting: Physician Assistant

## 2017-09-05 ENCOUNTER — Encounter: Payer: Self-pay | Admitting: Physician Assistant

## 2017-09-05 VITALS — BP 140/70 | HR 64 | Ht 58.5 in | Wt 140.4 lb

## 2017-09-05 DIAGNOSIS — Z860100 Personal history of colon polyps, unspecified: Secondary | ICD-10-CM

## 2017-09-05 DIAGNOSIS — R195 Other fecal abnormalities: Secondary | ICD-10-CM | POA: Diagnosis not present

## 2017-09-05 MED ORDER — PEG 3350-KCL-NA BICARB-NACL 420 G PO SOLR: ORAL | 0 refills | Status: DC

## 2017-09-05 NOTE — Patient Instructions (Signed)
If you are age 82 or older, your body mass index should be between 23-30. Your Body mass index is 28.84 kg/m. If this is out of the aforementioned range listed, please consider follow up with your Primary Care Provider.  You have been scheduled for a colonoscopy. Please follow written instructions given to you at your visit today.  Please pick up your prep supplies at the pharmacy within the next 1-3 days. If you use inhalers (even only as needed), please bring them with you on the day of your procedure. Your physician has requested that you go to www.startemmi.com and enter the access code given to you at your visit today. This web site gives a general overview about your procedure. However, you should still follow specific instructions given to you by our office regarding your preparation for the procedure.

## 2017-09-05 NOTE — Progress Notes (Addendum)
 Subjective:    Patient ID: Melissa Carpenter, female    DOB: 10/02/1933, 83 y.o.   MRN: [id number]  HPI Hazell is a pleasant 83-year-old white female, new to GI today referred by Dr. Donald Moore MD for evaluation of Hemoccult positive stool documented on 2 occasions recently. Patient has history of hypertension, carotid disease, GERD, metabolic syndrome. She says that she has had prior colonoscopies and possibly one endoscopy all done with Dr. L[facility]. She believes she has history of colon polyps and her last colonoscopy was 9-10 years ago. Patient had a recent physical done, CBC showed hemoglobin of 13 hematocrit of 37 MCV of 89. Hemoccults were done as part of that evaluation and were positive. These were then repeated about 2 weeks later and again positive. Patient says that she does on occasion see a small amount of bright red blood if she has been constipated and had a lot of straining. She's been having more difficulty recently with constipation and has had difficulty passing her stool. She says sometimes she has to push on the side of her rectum or perianal area to get the stool to pass. She says she knows that her bladder has dropped and she suspects that her other pelvic organs have as well. He has no complaints of abdominal pain. Appetite is been fine and weight has been stable. She does get occasional heartburn and indigestion for which she uses Tums. She says in past year she had been on an acid blocker but weaned herself off. She has no complaints of dysphagia. She takes MiraLAX on an as-needed basis. No regular NSAIDs, does take baby aspirin daily. No blood thinners. Family history is negative for colon cancer or polyps.  Review of Systems Pertinent positive and negative review of systems were noted in the above HPI section.  All other review of systems was otherwise negative.  Outpatient Encounter Medications as of 09/05/2017  Medication Sig  .  amLODipine-atorvastatin (CADUET) 10-40 MG tablet Take 1 tablet by mouth daily.  . aspirin 81 MG tablet Take 81 mg by mouth daily.  . blood glucose meter kit and supplies KIT Dispense based on patient and insurance preference. Use daily as directed.  . Calcium Carbonate-Vitamin D (CALTRATE 600+D) 600-400 MG-UNIT per tablet Take 1 tablet by mouth daily.  . cholecalciferol (VITAMIN D) 400 UNITS TABS Take 1,000 Units by mouth.    . fish oil-omega-3 fatty acids 1000 MG capsule Take 1 g by mouth daily.    . hydrocortisone 2.5 % cream Apply topically 2 (two) times daily.  . metoprolol succinate (TOPROL-XL) 100 MG 24 hr tablet TAKE 1 TABLET (100 MG TOTAL) BY MOUTH DAILY. TAKE WITH OR IMMEDIATELY FOLLOWING A MEAL.  . metoprolol tartrate (LOPRESSOR) 25 MG tablet Take 1 tablet (25 mg total) by mouth as needed.  . Multiple Vitamin (MULTIVITAMIN) tablet Take 1 tablet by mouth daily.    . olmesartan (BENICAR) 40 MG tablet TAKE 1 TABLET BY MOUTH EVERY DAY  . ONETOUCH VERIO test strip USE AS DIRECTED DAILY  . polyethylene glycol-electrolytes (NULYTELY/GOLYTELY) 420 g solution Take as directed for colonoscopy prep.   No facility-administered encounter medications on file as of 09/05/2017.    Allergies  Allergen Reactions  . Codeine Hives  . Forteo [Teriparatide (Recombinant)] Other (See Comments)    Weakness and calcium increase  . Raloxifene Other (See Comments)    Eye problems Other reaction(s): Other (See Comments) Eye problems Eye problems  . Morphine Rash  .    Subjective:    Patient ID: Melissa Carpenter, female    DOB: 02/21/1934, 83 y.o.   MRN: [id number]  HPI Hiral is a pleasant 83-year-old white female, new to GI today referred by Dr. Donald Moore MD for evaluation of Hemoccult positive stool documented on 2 occasions recently. Patient has history of hypertension, carotid disease, GERD, metabolic syndrome. She says that she has had prior colonoscopies and possibly one endoscopy all done with Dr. L[facility] Weeks [facility]. She believes she has history of colon polyps and her last colonoscopy was 9-10 years ago. Patient had a recent physical done, CBC showed hemoglobin of 13 hematocrit of 37 MCV of 89. Hemoccults were done as part of that evaluation and were positive. These were then repeated about 2 weeks later and again positive. Patient says that she does on occasion see a small amount of bright red blood if she has been constipated and had a lot of straining. She's been having more difficulty recently with constipation and has had difficulty passing her stool. She says sometimes she has to push on the side of her rectum or perianal area to get the stool to pass. She says she knows that her bladder has dropped and she suspects that her other pelvic organs have as well. He has no complaints of abdominal pain. Appetite is been fine and weight has been stable. She does get occasional heartburn and indigestion for which she uses Tums. She says in past year she had been on an acid blocker but weaned herself off. She has no complaints of dysphagia. She takes MiraLAX on an as-needed basis. No regular NSAIDs, does take baby aspirin daily. No blood thinners. Family history is negative for colon cancer or polyps.  Review of Systems Pertinent positive and negative review of systems were noted in the above HPI section.  All other review of systems was otherwise negative.  Outpatient Encounter Medications as of 09/05/2017  Medication Sig  .  amLODipine-atorvastatin (CADUET) 10-40 MG tablet Take 1 tablet by mouth daily.  . aspirin 81 MG tablet Take 81 mg by mouth daily.  . blood glucose meter kit and supplies KIT Dispense based on patient and insurance preference. Use daily as directed.  . Calcium Carbonate-Vitamin D (CALTRATE 600+D) 600-400 MG-UNIT per tablet Take 1 tablet by mouth daily.  . cholecalciferol (VITAMIN D) 400 UNITS TABS Take 1,000 Units by mouth.    . fish oil-omega-3 fatty acids 1000 MG capsule Take 1 g by mouth daily.    . hydrocortisone 2.5 % cream Apply topically 2 (two) times daily.  . metoprolol succinate (TOPROL-XL) 100 MG 24 hr tablet TAKE 1 TABLET (100 MG TOTAL) BY MOUTH DAILY. TAKE WITH OR IMMEDIATELY FOLLOWING A MEAL.  . metoprolol tartrate (LOPRESSOR) 25 MG tablet Take 1 tablet (25 mg total) by mouth as needed.  . Multiple Vitamin (MULTIVITAMIN) tablet Take 1 tablet by mouth daily.    . olmesartan (BENICAR) 40 MG tablet TAKE 1 TABLET BY MOUTH EVERY DAY  . ONETOUCH VERIO test strip USE AS DIRECTED DAILY  . polyethylene glycol-electrolytes (NULYTELY/GOLYTELY) 420 g solution Take as directed for colonoscopy prep.   No facility-administered encounter medications on file as of 09/05/2017.    Allergies  Allergen Reactions  . Codeine Hives  . Forteo [Teriparatide (Recombinant)] Other (See Comments)    Weakness and calcium increase  . Raloxifene Other (See Comments)    Eye problems Other reaction(s): Other (See Comments) Eye problems Eye problems  . Morphine Rash  .    Subjective:    Patient ID: Melissa Carpenter, female    DOB: 10/02/1933, 83 y.o.   MRN: [id number]  HPI Hazell is a pleasant 83-year-old white female, new to GI today referred by Dr. Donald Moore MD for evaluation of Hemoccult positive stool documented on 2 occasions recently. Patient has history of hypertension, carotid disease, GERD, metabolic syndrome. She says that she has had prior colonoscopies and possibly one endoscopy all done with Dr. Landon Weeks [facility]. She believes she has history of colon polyps and her last colonoscopy was 9-10 years ago. Patient had a recent physical done, CBC showed hemoglobin of 13 hematocrit of 37 MCV of 89. Hemoccults were done as part of that evaluation and were positive. These were then repeated about 2 weeks later and again positive. Patient says that she does on occasion see a small amount of bright red blood if she has been constipated and had a lot of straining. She's been having more difficulty recently with constipation and has had difficulty passing her stool. She says sometimes she has to push on the side of her rectum or perianal area to get the stool to pass. She says she knows that her bladder has dropped and she suspects that her other pelvic organs have as well. He has no complaints of abdominal pain. Appetite is been fine and weight has been stable. She does get occasional heartburn and indigestion for which she uses Tums. She says in past year she had been on an acid blocker but weaned herself off. She has no complaints of dysphagia. She takes MiraLAX on an as-needed basis. No regular NSAIDs, does take baby aspirin daily. No blood thinners. Family history is negative for colon cancer or polyps.  Review of Systems Pertinent positive and negative review of systems were noted in the above HPI section.  All other review of systems was otherwise negative.  Outpatient Encounter Medications as of 09/05/2017  Medication Sig  .  amLODipine-atorvastatin (CADUET) 10-40 MG tablet Take 1 tablet by mouth daily.  . aspirin 81 MG tablet Take 81 mg by mouth daily.  . blood glucose meter kit and supplies KIT Dispense based on patient and insurance preference. Use daily as directed.  . Calcium Carbonate-Vitamin D (CALTRATE 600+D) 600-400 MG-UNIT per tablet Take 1 tablet by mouth daily.  . cholecalciferol (VITAMIN D) 400 UNITS TABS Take 1,000 Units by mouth.    . fish oil-omega-3 fatty acids 1000 MG capsule Take 1 g by mouth daily.    . hydrocortisone 2.5 % cream Apply topically 2 (two) times daily.  . metoprolol succinate (TOPROL-XL) 100 MG 24 hr tablet TAKE 1 TABLET (100 MG TOTAL) BY MOUTH DAILY. TAKE WITH OR IMMEDIATELY FOLLOWING A MEAL.  . metoprolol tartrate (LOPRESSOR) 25 MG tablet Take 1 tablet (25 mg total) by mouth as needed.  . Multiple Vitamin (MULTIVITAMIN) tablet Take 1 tablet by mouth daily.    . olmesartan (BENICAR) 40 MG tablet TAKE 1 TABLET BY MOUTH EVERY DAY  . ONETOUCH VERIO test strip USE AS DIRECTED DAILY  . polyethylene glycol-electrolytes (NULYTELY/GOLYTELY) 420 g solution Take as directed for colonoscopy prep.   No facility-administered encounter medications on file as of 09/05/2017.    Allergies  Allergen Reactions  . Codeine Hives  . Forteo [Teriparatide (Recombinant)] Other (See Comments)    Weakness and calcium increase  . Raloxifene Other (See Comments)    Eye problems Other reaction(s): Other (See Comments) Eye problems Eye problems  . Morphine Rash  .

## 2017-09-06 NOTE — Progress Notes (Signed)
I agree with the above note, plan.  Colon cancer screening FOBT testing in 84 yo woman, +.

## 2017-09-11 DIAGNOSIS — Z1231 Encounter for screening mammogram for malignant neoplasm of breast: Secondary | ICD-10-CM | POA: Diagnosis not present

## 2017-09-25 NOTE — Progress Notes (Signed)
Cardiology Office Note   Date:  09/26/2017   ID:  Melissa Carpenter, DOB 07/17/1934, MRN [id number]  PCP:  Chipper Herb, MD  Cardiologist:   Minus Breeding, MD   Chief Complaint  Patient presents with  . Palpitations      History of Present Illness: Melissa Carpenter is a 84 y.o. female who presents for follow up of palpitations and HTN.  She was seen in Nov by Kerin Ransom [facility] because she was hearing her "heart beat in my head".   She had an echo which demonstrated mild/mod AS.  Since that visit she is doing better.  She still hears the heart beat but it is not a problem and it is not 24/7.  The patient denies any new symptoms such as chest discomfort, neck or arm discomfort. There has been no new shortness of breath, PND or orthopnea. There have been no reported palpitations, presyncope or syncope.     Past Medical History:  Diagnosis Date  . Aortic insufficiency    Mild, echo, December, 2009  . Arthritis   . Carotid artery disease ([place])    , Doppler, 2003  . Cataract   . Cellulitis   . Chest pain    Catheterization, 2003, no significant CAD  . Diastolic dysfunction    Mild diastolic dysfunction, echo, December, 2012  . DJD (degenerative joint disease)   . Dyslipidemia   . Edema    November, 2012  . GERD (gastroesophageal reflux disease)   . Hyperlipidemia   . Hypertension   . Mitral valve prolapse    Echo, 2009, very mild intermittent prolapse of the posterior leaflet, no MR  . Osteoporosis   . Prediabetes   . Tachycardia    Nighttime tachycardia, February, 2014  . Thyroid nodule     Past Surgical History:  Procedure Laterality Date  . ABDOMINAL HYSTERECTOMY  1964  . APPENDECTOMY  1946  . BIOPSY THYROID Left   . CARDIAC CATHETERIZATION  sept 2003   no significant cad  . CATARACT EXTRACTION, BILATERAL    . NASAL SINUS SURGERY    . TONSILLECTOMY       Current Outpatient Medications  Medication Sig Dispense Refill  . amLODipine-atorvastatin (CADUET) 10-40 MG  tablet Take 1 tablet by mouth daily. 90 tablet 0  . aspirin 81 MG tablet Take 81 mg by mouth daily.    . blood glucose meter kit and supplies KIT Dispense based on patient and insurance preference. Use daily as directed. 1 each 11  . Calcium Carbonate-Vitamin D (CALTRATE 600+D) 600-400 MG-UNIT per tablet Take 1 tablet by mouth daily.    . cholecalciferol (VITAMIN D) 400 UNITS TABS Take 1,000 Units by mouth.      . fish oil-omega-3 fatty acids 1000 MG capsule Take 1 g by mouth daily.      . hydrocortisone 2.5 % cream Apply topically 2 (two) times daily. 30 g 1  . metoprolol succinate (TOPROL-XL) 100 MG 24 hr tablet TAKE 1 TABLET (100 MG TOTAL) BY MOUTH DAILY. TAKE WITH OR IMMEDIATELY FOLLOWING A MEAL. 90 tablet 1  . metoprolol tartrate (LOPRESSOR) 25 MG tablet Take 1 tablet (25 mg total) by mouth as needed. 30 tablet 3  . Multiple Vitamin (MULTIVITAMIN) tablet Take 1 tablet by mouth daily.      Marland Kitchen olmesartan (BENICAR) 40 MG tablet TAKE 1 TABLET BY MOUTH EVERY DAY 90 tablet 1  . ONETOUCH VERIO test strip USE AS DIRECTED DAILY 100 each 2  No current facility-administered medications for this visit.     Allergies:   Codeine; Forteo [teriparatide (recombinant)]; Raloxifene; Morphine; Penicillins; Sulfites; and Sulfonamide derivatives    ROS:  Please see the history of present illness.   Otherwise, review of systems are positive for none   All other systems are reviewed and negative.    PHYSICAL EXAM: VS:  BP 133/66   Pulse 60   Ht 5' (1.524 m)   Wt 141 lb (64 kg)   BMI 27.54 kg/m  , BMI Body mass index is 27.54 kg/m.  GENERAL:  Well appearing NECK:  No jugular venous distention, waveform within normal limits, carotid upstroke brisk and symmetric, no bruits, no thyromegaly LUNGS:  Clear to auscultation bilaterally CHEST:  Unremarkable HEART:  PMI not displaced or sustained,S1 and S2 within normal limits, no S3, no S4, no clicks, no rubs, 3 out of 6 apical systolic murmur early peaking  and radiating at the aortic outflow tract and into the carotids, no diastolic murmurs ABD:  Flat, positive bowel sounds normal in frequency in pitch, no bruits, no rebound, no guarding, no midline pulsatile mass, no hepatomegaly, no splenomegaly EXT:  2 plus pulses throughout, no edema, no cyanosis no clubbing   EKG:  EKG is not ordered today.   Recent Labs: 07/03/2017: ALT 29; BUN 14; Creatinine, Ser 0.59; Potassium 4.1; Sodium 143 07/13/2017: TSH 1.770 07/30/2017: Hemoglobin 13.0; Platelets 214    Lipid Panel    Component Value Date/Time   CHOL 144 07/03/2017 1022   CHOL 165 01/13/2013 0833   TRIG 167 (H) 07/03/2017 1022   TRIG 198 (H) 03/01/2017 1706   TRIG 240 (H) 01/13/2013 0833   HDL 43 07/03/2017 1022   HDL 38 (L) 03/01/2017 1706   HDL 39 (L) 01/13/2013 0833   CHOLHDL 3.3 07/03/2017 1022   LDLCALC 68 07/03/2017 1022   LDLCALC 83 03/18/2014 0819   LDLCALC 78 01/13/2013 0833      Wt Readings from Last 3 Encounters:  09/26/17 141 lb (64 kg)  09/05/17 140 lb 6 oz (63.7 kg)  08/28/17 142 lb (64.4 kg)      Other studies Reviewed: Additional studies/ records that were reviewed today include:  Echo.  Review of the above records demonstrates:     ASSESSMENT AND PLAN:  HTN: Elevated in the morning by her report although it is good here today.  Is going to keep a blood pressure diary and I will make adjustments may be to the timing of her medications or perhaps the doses based on this 2-week report.   PALPITATIONS:  She was hearing her heartbeat in her head.  This is not particular problematic.  She is not really feeling skipping heartbeats and she is had no other symptoms.  No change in therapy is indicated.    CAROTID STENOSIS:  She had mild plaque in 2018.   No further imaging is indicated.  MURMUR:   This was mild/mod AS.  I will follow this clinically.  HTN:   Her blood pressure was  at target.   Current medicines are reviewed at length with the patient  today.  The patient does not have concerns regarding medicines.  The following changes have been made:   None  Labs/ tests ordered today include:   None  No orders of the defined types were placed in this encounter.    Disposition:   FU with me in 12 months.    Signed, Minus Breeding, MD  09/26/2017 10:11 AM  [facility]

## 2017-09-26 ENCOUNTER — Encounter: Payer: Self-pay | Admitting: Cardiology

## 2017-09-26 ENCOUNTER — Ambulatory Visit (INDEPENDENT_AMBULATORY_CARE_PROVIDER_SITE_OTHER): Payer: Medicare Other | Admitting: Cardiology

## 2017-09-26 VITALS — BP 133/66 | HR 60 | Ht 60.0 in | Wt 141.0 lb

## 2017-09-26 DIAGNOSIS — R002 Palpitations: Secondary | ICD-10-CM | POA: Diagnosis not present

## 2017-09-26 DIAGNOSIS — I10 Essential (primary) hypertension: Secondary | ICD-10-CM

## 2017-09-26 NOTE — Patient Instructions (Signed)
Medication Instructions:  The current medical regimen is effective;  continue present plan and medications.  Follow-Up: Follow up in 1 year with Dr. Hochrein in [place].  You will receive a letter in the mail 2 months before you are due.  Please call us when you receive this letter to schedule your follow up appointment.  If you need a refill on your cardiac medications before your next appointment, please call your pharmacy.  Thank you for choosing [facility]!!     

## 2017-10-08 ENCOUNTER — Encounter: Payer: Self-pay | Admitting: Gastroenterology

## 2017-10-08 ENCOUNTER — Ambulatory Visit (AMBULATORY_SURGERY_CENTER): Payer: Medicare Other | Admitting: Gastroenterology

## 2017-10-08 ENCOUNTER — Other Ambulatory Visit: Payer: Self-pay

## 2017-10-08 VITALS — BP 139/71 | HR 59 | Temp 97.3°F | Resp 14 | Ht <= 58 in | Wt 140.0 lb

## 2017-10-08 DIAGNOSIS — R195 Other fecal abnormalities: Secondary | ICD-10-CM | POA: Diagnosis not present

## 2017-10-08 DIAGNOSIS — K5730 Diverticulosis of large intestine without perforation or abscess without bleeding: Secondary | ICD-10-CM | POA: Diagnosis not present

## 2017-10-08 DIAGNOSIS — Z1211 Encounter for screening for malignant neoplasm of colon: Secondary | ICD-10-CM | POA: Diagnosis not present

## 2017-10-08 MED ORDER — SODIUM CHLORIDE 0.9 % IV SOLN: 500.0000 mL | Freq: Once | INTRAVENOUS | Status: DC

## 2017-10-08 NOTE — Patient Instructions (Addendum)
YOU HAD AN ENDOSCOPIC PROCEDURE TODAY AT [facility]:   Refer to the procedure report that was given to you for any specific questions about what was found during the examination.  If the procedure report does not answer your questions, please call your gastroenterologist to clarify.  If you requested that your care partner not be given the details of your procedure findings, then the procedure report has been included in a sealed envelope for you to review at your convenience later.  YOU SHOULD EXPECT: Some feelings of bloating in the abdomen. Passage of more gas than usual.  Walking can help get rid of the air that was put into your GI tract during the procedure and reduce the bloating. If you had a lower endoscopy (such as a colonoscopy or flexible sigmoidoscopy) you may notice spotting of blood in your stool or on the toilet paper. If you underwent a bowel prep for your procedure, you may not have a normal bowel movement for a few days.  Please Note:  You might notice some irritation and congestion in your nose or some drainage.  This is from the oxygen used during your procedure.  There is no need for concern and it should clear up in a day or so.  SYMPTOMS TO REPORT IMMEDIATELY:   Following lower endoscopy (colonoscopy or flexible sigmoidoscopy):  Excessive amounts of blood in the stool  Significant tenderness or worsening of abdominal pains  Swelling of the abdomen that is new, acute  Fever of 100F or higher   For urgent or emergent issues, a gastroenterologist can be reached at any hour by calling [phone].   DIET:  We do recommend a small meal at first, but then you may proceed to your regular diet.  Drink plenty of fluids but you should avoid alcoholic beverages for 24 hours.  ACTIVITY:  You should plan to take it easy for the rest of today and you should NOT DRIVE or use heavy machinery until tomorrow (because of the sedation medicines used during the test).     FOLLOW UP: Our staff will call the number listed on your records the next business day following your procedure to check on you and address any questions or concerns that you may have regarding the information given to you following your procedure. If we do not reach you, we will leave a message.  However, if you are feeling well and you are not experiencing any problems, there is no need to return our call.  We will assume that you have returned to your regular daily activities without incident.  If any biopsies were taken you will be contacted by phone or by letter within the next 1-3 weeks.  Please call us at [phone] if you have not heard about the biopsies in 3 weeks.    SIGNATURES/CONFIDENTIALITY: You and/or your care partner have signed paperwork which will be entered into your electronic medical record.  These signatures attest to the fact that that the information above on your After Visit Summary has been reviewed and is understood.  Full responsibility of the confidentiality of this discharge information lies with you and/or your care-partner.   Handout was given to your care partner on diverticulosis. You may resume your current medications today. Please call if any questions or concerns.

## 2017-10-08 NOTE — Progress Notes (Signed)
A and O x3. Report to RN. Tolerated MAC anesthesia well.

## 2017-10-08 NOTE — Progress Notes (Signed)
No problems noted in the recovery room. maw 

## 2017-10-08 NOTE — Op Note (Signed)
[facility] Patient Name: Melissa Carpenter Procedure Date: 10/08/2017 8:38 AM MRN: [id number] Endoscopist: Milus Banister , MD Age: 84 Referring MD:  Date of Birth: June 27, 1934 Gender: Female Account #: [id number] Procedure:                Colonoscopy Indications:              Heme positive stool Medicines:                Monitored Anesthesia Care Procedure:                Pre-Anesthesia Assessment:                           - Prior to the procedure, a History and Physical                            was performed, and patient medications and                            allergies were reviewed. The patient's tolerance of                            previous anesthesia was also reviewed. The risks                            and benefits of the procedure and the sedation                            options and risks were discussed with the patient.                            All questions were answered, and informed consent                            was obtained. Prior Anticoagulants: The patient has                            taken no previous anticoagulant or antiplatelet                            agents. ASA Grade Assessment: II - A patient with                            mild systemic disease. After reviewing the risks                            and benefits, the patient was deemed in                            satisfactory condition to undergo the procedure.                           After obtaining informed consent, the colonoscope  was passed under direct vision. Throughout the                            procedure, the patient's blood pressure, pulse, and                            oxygen saturations were monitored continuously. The                            Colonoscope was introduced through the anus and                            advanced to the the cecum, identified by                            appendiceal orifice and ileocecal valve. The                      colonoscopy was performed without difficulty. The                            patient tolerated the procedure well. The quality                            of the bowel preparation was good. The ileocecal                            valve, appendiceal orifice, and rectum were                            photographed. Scope In: 8:45:02 AM Scope Out: 8:59:35 AM Scope Withdrawal Time: 0 hours 7 minutes 51 seconds  Total Procedure Duration: 0 hours 14 minutes 33 seconds  Findings:                 A few small-mouthed diverticula were found in the                            left colon.                           The exam was otherwise without abnormality on                            direct and retroflexion views. Complications:            No immediate complications. Estimated blood loss:                            None. Estimated Blood Loss:     Estimated blood loss: none. Impression:               - Diverticulosis in the left colon.                           - The examination was otherwise normal on direct  and retroflexion views.                           - No polyps or cancers. Recommendation:           - Patient has a contact number available for                            emergencies. The signs and symptoms of potential                            delayed complications were discussed with the                            patient. Return to normal activities tomorrow.                            Written discharge instructions were provided to the                            patient.                           - Resume previous diet.                           - Continue present medications.                           You do not need any further colon cancer screening                            tests (including stool testing). These types of                            tests generally stop around age 71-80. Milus Banister, MD 10/08/2017 9:01:13 AM This  report has been signed electronically.

## 2017-10-09 ENCOUNTER — Telehealth: Payer: Self-pay

## 2017-10-09 NOTE — Telephone Encounter (Signed)
Please inform patient of the problem and give her a prescription for Accu-Chek stick products with monitor if she needs this.

## 2017-10-09 NOTE — Telephone Encounter (Signed)
Please change

## 2017-10-09 NOTE — Telephone Encounter (Signed)
  Follow up Call-  Call back number 10/08/2017  Post procedure Call Back phone  # [phone]  Permission to leave phone message Yes  Some recent data might be hidden     Patient questions:  Do you have a fever, pain , or abdominal swelling? No. Pain Score  0 *  Have you tolerated food without any problems? Yes.    Have you been able to return to your normal activities? Yes.    Do you have any questions about your discharge instructions: Diet   No. Medications  No. Follow up visit  No.  Do you have questions or concerns about your Care? No.  Actions: * If pain score is 4 or above: No action needed, pain <4.

## 2017-10-09 NOTE — Telephone Encounter (Signed)
Insurance denied prior auth for One Touch Verio strips   Accu chek products are covered

## 2017-10-10 ENCOUNTER — Other Ambulatory Visit: Payer: Self-pay

## 2017-10-10 MED ORDER — BLOOD GLUCOSE MONITOR KIT: PACK | 0 refills | Status: DC

## 2017-10-10 MED ORDER — BLOOD GLUCOSE MONITOR KIT: PACK | 11 refills | Status: DC

## 2017-10-10 MED ORDER — GLUCOSE BLOOD VI STRP: ORAL_STRIP | 2 refills | Status: DC

## 2017-10-10 MED ORDER — ACCU-CHEK AVIVA PLUS W/DEVICE KIT: 1.0000 [IU] | PACK | Freq: Every day | 0 refills | Status: DC

## 2017-10-16 ENCOUNTER — Other Ambulatory Visit: Payer: Self-pay | Admitting: *Deleted

## 2017-10-16 MED ORDER — ACCU-CHEK AVIVA PLUS W/DEVICE KIT: 1.0000 [IU] | PACK | Freq: Every day | 0 refills | Status: DC

## 2017-10-16 MED ORDER — ACCU-CHEK SOFTCLIX LANCETS MISC: 3 refills | Status: DC

## 2017-10-18 ENCOUNTER — Other Ambulatory Visit: Payer: Self-pay | Admitting: *Deleted

## 2017-10-18 MED ORDER — GLUCOSE BLOOD VI STRP: ORAL_STRIP | 3 refills | Status: DC

## 2017-10-19 ENCOUNTER — Other Ambulatory Visit: Payer: Self-pay | Admitting: *Deleted

## 2017-10-19 MED ORDER — OLMESARTAN MEDOXOMIL 40 MG PO TABS: 40.0000 mg | ORAL_TABLET | Freq: Every day | ORAL | 0 refills | Status: DC

## 2017-10-25 ENCOUNTER — Telehealth: Payer: Self-pay | Admitting: *Deleted

## 2017-10-25 NOTE — Telephone Encounter (Signed)
fax received [facility] [place] [facility] requested Olmesartan 40 mg tab on backorder Please advise

## 2017-10-26 MED ORDER — TELMISARTAN 40 MG PO TABS: 40.0000 mg | ORAL_TABLET | Freq: Every day | ORAL | 2 refills | Status: DC

## 2017-10-26 NOTE — Telephone Encounter (Signed)
olemasartan is n backorder- telimesartan sent to pharmacy for replacement

## 2017-10-26 NOTE — Telephone Encounter (Signed)
Pt notified of new RX 

## 2017-10-29 ENCOUNTER — Telehealth: Payer: Self-pay | Admitting: Family Medicine

## 2017-10-29 NOTE — Telephone Encounter (Signed)
Pt called about appts

## 2017-11-08 ENCOUNTER — Other Ambulatory Visit: Payer: Self-pay | Admitting: Family Medicine

## 2017-11-12 ENCOUNTER — Other Ambulatory Visit: Payer: Medicare Other

## 2017-11-12 DIAGNOSIS — I10 Essential (primary) hypertension: Secondary | ICD-10-CM

## 2017-11-12 DIAGNOSIS — R7303 Prediabetes: Secondary | ICD-10-CM | POA: Diagnosis not present

## 2017-11-12 DIAGNOSIS — E559 Vitamin D deficiency, unspecified: Secondary | ICD-10-CM | POA: Diagnosis not present

## 2017-11-12 DIAGNOSIS — K219 Gastro-esophageal reflux disease without esophagitis: Secondary | ICD-10-CM | POA: Diagnosis not present

## 2017-11-12 DIAGNOSIS — E7800 Pure hypercholesterolemia, unspecified: Secondary | ICD-10-CM

## 2017-11-13 LAB — CBC WITH DIFFERENTIAL/PLATELET
BASOS: 1 %
Basophils Absolute: 0 10*3/uL (ref 0.0–0.2)
EOS (ABSOLUTE): 0.2 10*3/uL (ref 0.0–0.4)
Eos: 4 %
HEMATOCRIT: 40 % (ref 34.0–46.6)
Hemoglobin: 13.1 g/dL (ref 11.1–15.9)
IMMATURE GRANULOCYTES: 0 %
Immature Grans (Abs): 0 10*3/uL (ref 0.0–0.1)
LYMPHS ABS: 1.6 10*3/uL (ref 0.7–3.1)
Lymphs: 29 %
MCH: 31 pg (ref 26.6–33.0)
MCHC: 32.8 g/dL (ref 31.5–35.7)
MCV: 95 fL (ref 79–97)
MONOS ABS: 0.5 10*3/uL (ref 0.1–0.9)
Monocytes: 9 %
Neutrophils Absolute: 3.2 10*3/uL (ref 1.4–7.0)
Neutrophils: 57 %
PLATELETS: 231 10*3/uL (ref 150–379)
RBC: 4.23 x10E6/uL (ref 3.77–5.28)
RDW: 14 % (ref 12.3–15.4)
WBC: 5.6 10*3/uL (ref 3.4–10.8)

## 2017-11-13 LAB — BMP8+EGFR
BUN / CREAT RATIO: 28 (ref 12–28)
BUN: 15 mg/dL (ref 8–27)
CALCIUM: 9.6 mg/dL (ref 8.7–10.3)
CO2: 22 mmol/L (ref 20–29)
CREATININE: 0.54 mg/dL — AB (ref 0.57–1.00)
Chloride: 104 mmol/L (ref 96–106)
GFR calc Af Amer: 102 mL/min/{1.73_m2} (ref >59)
GFR, EST NON AFRICAN AMERICAN: 89 mL/min/{1.73_m2} (ref >59)
Glucose: 100 mg/dL — ABNORMAL HIGH (ref 65–99)
Potassium: 4 mmol/L (ref 3.5–5.2)
Sodium: 143 mmol/L (ref 134–144)

## 2017-11-13 LAB — LIPID PANEL
CHOL/HDL RATIO: 3.1 ratio (ref 0.0–4.4)
Cholesterol, Total: 143 mg/dL (ref 100–199)
HDL: 46 mg/dL (ref >39)
LDL CALC: 64 mg/dL (ref 0–99)
Triglycerides: 163 mg/dL — ABNORMAL HIGH (ref 0–149)
VLDL Cholesterol Cal: 33 mg/dL (ref 5–40)

## 2017-11-13 LAB — HEPATIC FUNCTION PANEL
ALT: 23 IU/L (ref 0–32)
AST: 27 IU/L (ref 0–40)
Albumin: 4.7 g/dL (ref 3.5–4.7)
Alkaline Phosphatase: 100 IU/L (ref 39–117)
BILIRUBIN TOTAL: 0.3 mg/dL (ref 0.0–1.2)
Bilirubin, Direct: 0.08 mg/dL (ref 0.00–0.40)
Total Protein: 6.9 g/dL (ref 6.0–8.5)

## 2017-11-13 LAB — VITAMIN D 25 HYDROXY (VIT D DEFICIENCY, FRACTURES): VIT D 25 HYDROXY: 47.8 ng/mL (ref 30.0–100.0)

## 2017-11-15 ENCOUNTER — Ambulatory Visit: Payer: Medicare Other | Admitting: Family Medicine

## 2017-11-19 ENCOUNTER — Encounter: Payer: Self-pay | Admitting: Family Medicine

## 2017-11-19 ENCOUNTER — Ambulatory Visit (INDEPENDENT_AMBULATORY_CARE_PROVIDER_SITE_OTHER): Payer: Medicare Other | Admitting: Family Medicine

## 2017-11-19 VITALS — BP 114/60 | HR 65 | Temp 97.8°F | Ht <= 58 in | Wt 142.0 lb

## 2017-11-19 DIAGNOSIS — K219 Gastro-esophageal reflux disease without esophagitis: Secondary | ICD-10-CM | POA: Diagnosis not present

## 2017-11-19 DIAGNOSIS — E7800 Pure hypercholesterolemia, unspecified: Secondary | ICD-10-CM | POA: Diagnosis not present

## 2017-11-19 DIAGNOSIS — M25551 Pain in right hip: Secondary | ICD-10-CM | POA: Diagnosis not present

## 2017-11-19 DIAGNOSIS — I10 Essential (primary) hypertension: Secondary | ICD-10-CM

## 2017-11-19 DIAGNOSIS — R7303 Prediabetes: Secondary | ICD-10-CM | POA: Diagnosis not present

## 2017-11-19 DIAGNOSIS — E559 Vitamin D deficiency, unspecified: Secondary | ICD-10-CM

## 2017-11-19 MED ORDER — TRIAMCINOLONE ACETONIDE 0.1 % EX CREA: 1.0000 "application " | TOPICAL_CREAM | Freq: Two times a day (BID) | CUTANEOUS | 3 refills | Status: DC

## 2017-11-19 MED ORDER — METHYLPREDNISOLONE ACETATE 80 MG/ML IJ SUSP
60.0000 mg | Freq: Once | INTRAMUSCULAR | Status: AC
  Administered 2017-11-19: 60 mg via INTRAMUSCULAR

## 2017-11-19 NOTE — Patient Instructions (Addendum)
Medicare Annual Wellness Visit  [facility] and the medical providers at [facility] strive to bring you the best medical care.  In doing so we not only want to address your current medical conditions and concerns but also to detect new conditions early and prevent illness, disease and health-related problems.    Medicare offers a yearly Wellness Visit which allows our clinical staff to assess your need for preventative services including immunizations, lifestyle education, counseling to decrease risk of preventable diseases and screening for fall risk and other medical concerns.    This visit is provided free of charge (no copay) for all Medicare recipients. The clinical pharmacists at [facility] have begun to conduct these Wellness Visits which will also include a thorough review of all your medications.    As you primary medical provider recommend that you make an appointment for your Annual Wellness Visit if you have not done so already this year.  You may set up this appointment before you leave today or you may call back [phone]) and schedule an appointment.  Please make sure when you call that you mention that you are scheduling your Annual Wellness Visit with the clinical pharmacist so that the appointment may be made for the proper length of time.     Continue current medications. Continue good therapeutic lifestyle changes which include good diet and exercise. Fall precautions discussed with patient. If an FOBT was given today- please return it to our front desk. If you are over 62 years old - you may need Prevnar 35 or the adult Pneumonia vaccine.  **Flu shots are available--- please call and schedule a FLU-CLINIC appointment**  After your visit with [place] today you will receive a survey in the mail or online from [employer] regarding your care with [place]. Please take a moment to fill this out. Your feedback is very  important to [place] as you can help [place] better understand your patient needs as well as improve your experience and satisfaction. WE CARE ABOUT YOU!!!   If the problem with the right hip does not improve, the patient should get back in touch with [place]. She should continue with her current medications and try to get as much exercise as possible, always being careful not to put yourself at risk for falling

## 2017-11-19 NOTE — Addendum Note (Signed)
Addended by: Zannie Cove on: 11/19/2017 04:21 PM   Modules accepted: Orders

## 2017-11-19 NOTE — Progress Notes (Signed)
Subjective:    Patient ID: Melissa Carpenter, female    DOB: 1933-08-26, 84 y.o.   MRN: [id number]  HPI Pt here for follow up and management of chronic medical problems which includes hypertension and gerd. She is taking medication regularly.  The patient is doing well today but does complain of some right hip pain.  She would like to have an steroid injection to help the pain.  Looking back, do not see where she has had any previous x-rays.  She has had lab work done and we will review this with her during the visit today.  Triglycerides were elevated at 163 and the LDL cholesterol was good at 64.  The good cholesterol was good at 46.  The CBC was within normal limits.  The blood sugar was slightly elevated at 100.  The creatinine was actually decreased and not elevated and all of the electrolytes including potassium are good.  Vitamin D level was excellent at 47.8 and all liver function tests are normal.  She brings in blood sugars from the outside as well as blood pressures and all of the blood sugars were excellent.  Most of the blood pressures were good in the range of the upper 120s to as high as 409 for the systolic and then 81X and 91Y for the diastolic.  These will be scanned into the record.  The patient has been somewhat stressed with dealing with her husband and his cancer and the possibility of a growth in his mouth.  She denies any chest pain pressure or tightness.  She has had some heartburn and indigestion and has been doing Tums for this and we will have her switch to ranitidine 150 mg twice daily before breakfast and supper.  She denies any blood in the stool or black tarry bowel movements and recently had a colonoscopy that was entirely normal.  She is passing her water without problems.  She has seen the orthopedist about her hip and really does not want to go back to see him so we will see about giving her a shot in the hip once we establish where the pain is located.    Patient Active  Problem List   Diagnosis Date Noted  . Normal coronary arteries 06/20/2017  . Palpitations 02/21/2017  . Mild aortic stenosis 02/21/2017  . Pre-diabetes 02/01/2016  . Chronic venous insufficiency 09/14/2015  . Metabolic syndrome 78/29/5621  . Gastroesophageal reflux disease with hiatal hernia 07/21/2013  . Osteoporosis 03/05/2013  . Edema   . Carotid artery disease ([place])   . Dyslipidemia   . Mitral valve prolapse   . Ejection fraction   . Aortic insufficiency   . Essential hypertension   . Chest pain    Outpatient Encounter Medications as of 11/19/2017  Medication Sig  . ACCU-CHEK SOFTCLIX LANCETS lancets Use to check blood sugars daily  . amLODipine-atorvastatin (CADUET) 10-40 MG tablet Take 1 tablet by mouth daily.  Marland Kitchen aspirin 81 MG tablet Take 81 mg by mouth daily.  . Blood Glucose Monitoring Suppl (ACCU-CHEK AVIVA PLUS) w/Device KIT 1 Units by Does not apply route daily.  . Calcium Carbonate-Vitamin D (CALTRATE 600+D) 600-400 MG-UNIT per tablet Take 1 tablet by mouth daily.  . cholecalciferol (VITAMIN D) 400 UNITS TABS Take 1,000 Units by mouth.    . fish oil-omega-3 fatty acids 1000 MG capsule Take 1 g by mouth daily.    . fluticasone (FLONASE) 50 MCG/ACT nasal spray SPRAY 2 SPRAYS INTO EACH NOSTRIL EVERY DAY  .  glucose blood (ACCU-CHEK AVIVA PLUS) test strip Use to check blood sugars daily  . metoprolol succinate (TOPROL-XL) 100 MG 24 hr tablet TAKE 1 TABLET (100 MG TOTAL) BY MOUTH DAILY. TAKE WITH OR IMMEDIATELY FOLLOWING A MEAL.  . Multiple Vitamin (MULTIVITAMIN) tablet Take 1 tablet by mouth daily.    Marland Kitchen olmesartan (BENICAR) 40 MG tablet Take 1 tablet (40 mg total) by mouth daily.  . metoprolol tartrate (LOPRESSOR) 25 MG tablet Take 1 tablet (25 mg total) by mouth as needed. (Patient not taking: Reported on 11/19/2017)  . telmisartan (MICARDIS) 40 MG tablet Take 1 tablet (40 mg total) by mouth daily. (Patient not taking: Reported on 11/19/2017)  . triamcinolone cream (KENALOG) 0.1  % Apply 1 application topically 2 (two) times daily.  . [DISCONTINUED] blood glucose meter kit and supplies KIT Test Blood sugars once daily  . [DISCONTINUED] hydrocortisone 2.5 % cream Apply topically 2 (two) times daily.  . [DISCONTINUED] 0.9 %  sodium chloride infusion    No facility-administered encounter medications on file as of 11/19/2017.       Review of Systems  Constitutional: Negative.   HENT: Negative.   Eyes: Negative.   Respiratory: Negative.   Cardiovascular: Negative.   Gastrointestinal: Negative.   Endocrine: Negative.   Genitourinary: Negative.   Musculoskeletal: Positive for arthralgias (right hip pain ).  Skin: Negative.   Allergic/Immunologic: Negative.   Neurological: Negative.   Hematological: Negative.   Psychiatric/Behavioral: Negative.        Objective:   Physical Exam  Constitutional: She is oriented to person, place, and time. She appears well-developed and well-nourished. No distress.  The patient is calm pleasant and relaxed  HENT:  Head: Normocephalic and atraumatic.  Right Ear: External ear normal.  Left Ear: External ear normal.  Nose: Nose normal.  Mouth/Throat: Oropharynx is clear and moist. No oropharyngeal exudate.  Nasal turbinate congestion bilaterally  Eyes: Pupils are equal, round, and reactive to light. Conjunctivae and EOM are normal. Right eye exhibits no discharge. Left eye exhibits no discharge. No scleral icterus.  Neck: Normal range of motion. Neck supple. No thyromegaly present.  Left supraclavicular bruit otherwise no thyromegaly or other bruits audible in the carotids.  Cardiovascular: Normal rate, regular rhythm, normal heart sounds and intact distal pulses.  No murmur heard. Heart is regular at 72/min  Pulmonary/Chest: Effort normal and breath sounds normal. No respiratory distress. She has no wheezes. She has no rales.  Clear anteriorly and posteriorly  Abdominal: Soft. Bowel sounds are normal. She exhibits no mass.  There is no tenderness. There is no rebound and no guarding.  No abdominal tenderness masses organ enlargement or bruits  Musculoskeletal: Normal range of motion. She exhibits no edema or tenderness.  Lymphadenopathy:    She has no cervical adenopathy.  Neurological: She is alert and oriented to person, place, and time. She has normal reflexes. No cranial nerve deficit.  Skin: Skin is warm and dry. No rash noted.  Psychiatric: She has a normal mood and affect. Her behavior is normal. Judgment and thought content normal.  Nursing note and vitals reviewed.   BP 114/60 (BP Location: Left Arm)   Pulse 65   Temp 97.8 F (36.6 C) (Oral)   Ht _0  (1.473 m)   Wt 142 lb (64.4 kg)   BMI 29.68 kg/m   The area of point tenderness was noted in the right lateral hip and 60 of Depo-Medrol was injected to this area after cleansing with Betadine and the patient  tolerated the procedure well.     Assessment & Plan:  1. Essential hypertension -The blood pressure is good today and the patient will continue with current treatment and sodium restriction  2. Vitamin D deficiency -Vitamin D level was good and she will continue with current treatment  3. Pure hypercholesterolemia -The LDL C was good and the triglycerides were still slightly elevated but no change in treatment but continue aggressive therapeutic lifestyle changes  4. Gastroesophageal reflux disease, esophagitis presence not specified -Try ranitidine 150 mg twice daily before breakfast and supper for the next 4-6 weeks to see if this can improve the symptoms the patient is having.  5. Pre-diabetes -Continue to watch diet closely and get as much exercise as possible  6. Right hip pain -60 mg of Depo-Medrol to area of point tenderness, patient tolerated the procedure well.  Patient Instructions                       Medicare Annual Wellness Visit  [facility] and the medical providers at [facility] strive to  bring you the best medical care.  In doing so we not only want to address your current medical conditions and concerns but also to detect new conditions early and prevent illness, disease and health-related problems.    Medicare offers a yearly Wellness Visit which allows our clinical staff to assess your need for preventative services including immunizations, lifestyle education, counseling to decrease risk of preventable diseases and screening for fall risk and other medical concerns.    This visit is provided free of charge (no copay) for all Medicare recipients. The clinical pharmacists at [facility] have begun to conduct these Wellness Visits which will also include a thorough review of all your medications.    As you primary medical provider recommend that you make an appointment for your Annual Wellness Visit if you have not done so already this year.  You may set up this appointment before you leave today or you may call back ([phone]) and schedule an appointment.  Please make sure when you call that you mention that you are scheduling your Annual Wellness Visit with the clinical pharmacist so that the appointment may be made for the proper length of time.     Continue current medications. Continue good therapeutic lifestyle changes which include good diet and exercise. Fall precautions discussed with patient. If an FOBT was given today- please return it to our front desk. If you are over 82 years old - you may need Prevnar 37 or the adult Pneumonia vaccine.  **Flu shots are available--- please call and schedule a FLU-CLINIC appointment**  After your visit with [place] today you will receive a survey in the mail or online from [employer] regarding your care with [place]. Please take a moment to fill this out. Your feedback is very important to [place] as you can help [place] better understand your patient needs as well as improve your experience and satisfaction. WE CARE ABOUT YOU!!!     If the problem with the right hip does not improve, the patient should get back in touch with [place]. She should continue with her current medications and try to get as much exercise as possible, always being careful not to put yourself at risk for falling  Arrie Senate MD

## 2017-11-24 ENCOUNTER — Other Ambulatory Visit: Payer: Self-pay | Admitting: Family Medicine

## 2017-12-13 ENCOUNTER — Ambulatory Visit: Payer: Medicare Other | Admitting: Family Medicine

## 2018-01-23 ENCOUNTER — Other Ambulatory Visit: Payer: Self-pay | Admitting: Nurse Practitioner

## 2018-02-02 ENCOUNTER — Other Ambulatory Visit: Payer: Self-pay | Admitting: Family Medicine

## 2018-02-22 ENCOUNTER — Other Ambulatory Visit: Payer: Self-pay | Admitting: Family Medicine

## 2018-03-01 ENCOUNTER — Other Ambulatory Visit: Payer: Self-pay | Admitting: Family Medicine

## 2018-04-03 ENCOUNTER — Ambulatory Visit: Payer: Medicare Other | Admitting: Family Medicine

## 2018-04-17 ENCOUNTER — Other Ambulatory Visit: Payer: Medicare Other

## 2018-04-17 DIAGNOSIS — R7303 Prediabetes: Secondary | ICD-10-CM | POA: Diagnosis not present

## 2018-04-17 DIAGNOSIS — K219 Gastro-esophageal reflux disease without esophagitis: Secondary | ICD-10-CM | POA: Diagnosis not present

## 2018-04-17 DIAGNOSIS — E7800 Pure hypercholesterolemia, unspecified: Secondary | ICD-10-CM | POA: Diagnosis not present

## 2018-04-17 DIAGNOSIS — E559 Vitamin D deficiency, unspecified: Secondary | ICD-10-CM | POA: Diagnosis not present

## 2018-04-17 DIAGNOSIS — I10 Essential (primary) hypertension: Secondary | ICD-10-CM | POA: Diagnosis not present

## 2018-04-18 LAB — VITAMIN D 25 HYDROXY (VIT D DEFICIENCY, FRACTURES): VIT D 25 HYDROXY: 53.4 ng/mL (ref 30.0–100.0)

## 2018-04-18 LAB — HEPATIC FUNCTION PANEL
ALBUMIN: 4.6 g/dL (ref 3.5–4.7)
ALK PHOS: 91 IU/L (ref 39–117)
ALT: 21 IU/L (ref 0–32)
AST: 24 IU/L (ref 0–40)
BILIRUBIN, DIRECT: 0.12 mg/dL (ref 0.00–0.40)
Bilirubin Total: 0.3 mg/dL (ref 0.0–1.2)
TOTAL PROTEIN: 6.8 g/dL (ref 6.0–8.5)

## 2018-04-18 LAB — CBC WITH DIFFERENTIAL/PLATELET
BASOS: 0 %
Basophils Absolute: 0 10*3/uL (ref 0.0–0.2)
EOS (ABSOLUTE): 0.1 10*3/uL (ref 0.0–0.4)
EOS: 3 %
Hematocrit: 36.1 % (ref 34.0–46.6)
Hemoglobin: 12.6 g/dL (ref 11.1–15.9)
IMMATURE GRANS (ABS): 0 10*3/uL (ref 0.0–0.1)
IMMATURE GRANULOCYTES: 0 %
LYMPHS: 32 %
Lymphocytes Absolute: 1.5 10*3/uL (ref 0.7–3.1)
MCH: 31.8 pg (ref 26.6–33.0)
MCHC: 34.9 g/dL (ref 31.5–35.7)
MCV: 91 fL (ref 79–97)
MONOCYTES: 11 %
MONOS ABS: 0.5 10*3/uL (ref 0.1–0.9)
NEUTROS PCT: 54 %
Neutrophils Absolute: 2.6 10*3/uL (ref 1.4–7.0)
PLATELETS: 234 10*3/uL (ref 150–450)
RBC: 3.96 x10E6/uL (ref 3.77–5.28)
RDW: 12.4 % (ref 12.3–15.4)
WBC: 4.9 10*3/uL (ref 3.4–10.8)

## 2018-04-18 LAB — LIPID PANEL
Chol/HDL Ratio: 3 ratio (ref 0.0–4.4)
Cholesterol, Total: 122 mg/dL (ref 100–199)
HDL: 41 mg/dL (ref >39)
LDL Calculated: 50 mg/dL (ref 0–99)
Triglycerides: 157 mg/dL — ABNORMAL HIGH (ref 0–149)
VLDL Cholesterol Cal: 31 mg/dL (ref 5–40)

## 2018-04-18 LAB — BMP8+EGFR
BUN / CREAT RATIO: 27 (ref 12–28)
BUN: 16 mg/dL (ref 8–27)
CALCIUM: 9.8 mg/dL (ref 8.7–10.3)
CHLORIDE: 100 mmol/L (ref 96–106)
CO2: 20 mmol/L (ref 20–29)
Creatinine, Ser: 0.6 mg/dL (ref 0.57–1.00)
GFR calc non Af Amer: 85 mL/min/{1.73_m2} (ref >59)
GFR, EST AFRICAN AMERICAN: 98 mL/min/{1.73_m2} (ref >59)
GLUCOSE: 107 mg/dL — AB (ref 65–99)
POTASSIUM: 3.9 mmol/L (ref 3.5–5.2)
Sodium: 135 mmol/L (ref 134–144)

## 2018-04-20 ENCOUNTER — Other Ambulatory Visit: Payer: Self-pay | Admitting: Nurse Practitioner

## 2018-04-22 ENCOUNTER — Ambulatory Visit (INDEPENDENT_AMBULATORY_CARE_PROVIDER_SITE_OTHER): Payer: Medicare Other | Admitting: Family Medicine

## 2018-04-22 ENCOUNTER — Encounter: Payer: Self-pay | Admitting: Family Medicine

## 2018-04-22 ENCOUNTER — Ambulatory Visit (INDEPENDENT_AMBULATORY_CARE_PROVIDER_SITE_OTHER): Payer: Medicare Other

## 2018-04-22 VITALS — BP 138/62 | HR 62 | Temp 97.6°F | Ht <= 58 in | Wt 143.0 lb

## 2018-04-22 DIAGNOSIS — E7800 Pure hypercholesterolemia, unspecified: Secondary | ICD-10-CM

## 2018-04-22 DIAGNOSIS — E559 Vitamin D deficiency, unspecified: Secondary | ICD-10-CM | POA: Diagnosis not present

## 2018-04-22 DIAGNOSIS — K219 Gastro-esophageal reflux disease without esophagitis: Secondary | ICD-10-CM | POA: Diagnosis not present

## 2018-04-22 DIAGNOSIS — R7303 Prediabetes: Secondary | ICD-10-CM | POA: Diagnosis not present

## 2018-04-22 DIAGNOSIS — R232 Flushing: Secondary | ICD-10-CM | POA: Diagnosis not present

## 2018-04-22 DIAGNOSIS — I10 Essential (primary) hypertension: Secondary | ICD-10-CM

## 2018-04-22 DIAGNOSIS — J40 Bronchitis, not specified as acute or chronic: Secondary | ICD-10-CM | POA: Diagnosis not present

## 2018-04-22 IMAGING — DX DG CHEST 2V
2 series · 2 of 2 positions shown · non-contrast
Comparison: PA and lateral chest x-ray [DATE]

CLINICAL DATA: History of hypertension, hyperlipidemia, diastolic
dysfunction, aortic insufficiency.

EXAM:
CHEST - 2 VIEW

[chest pa]
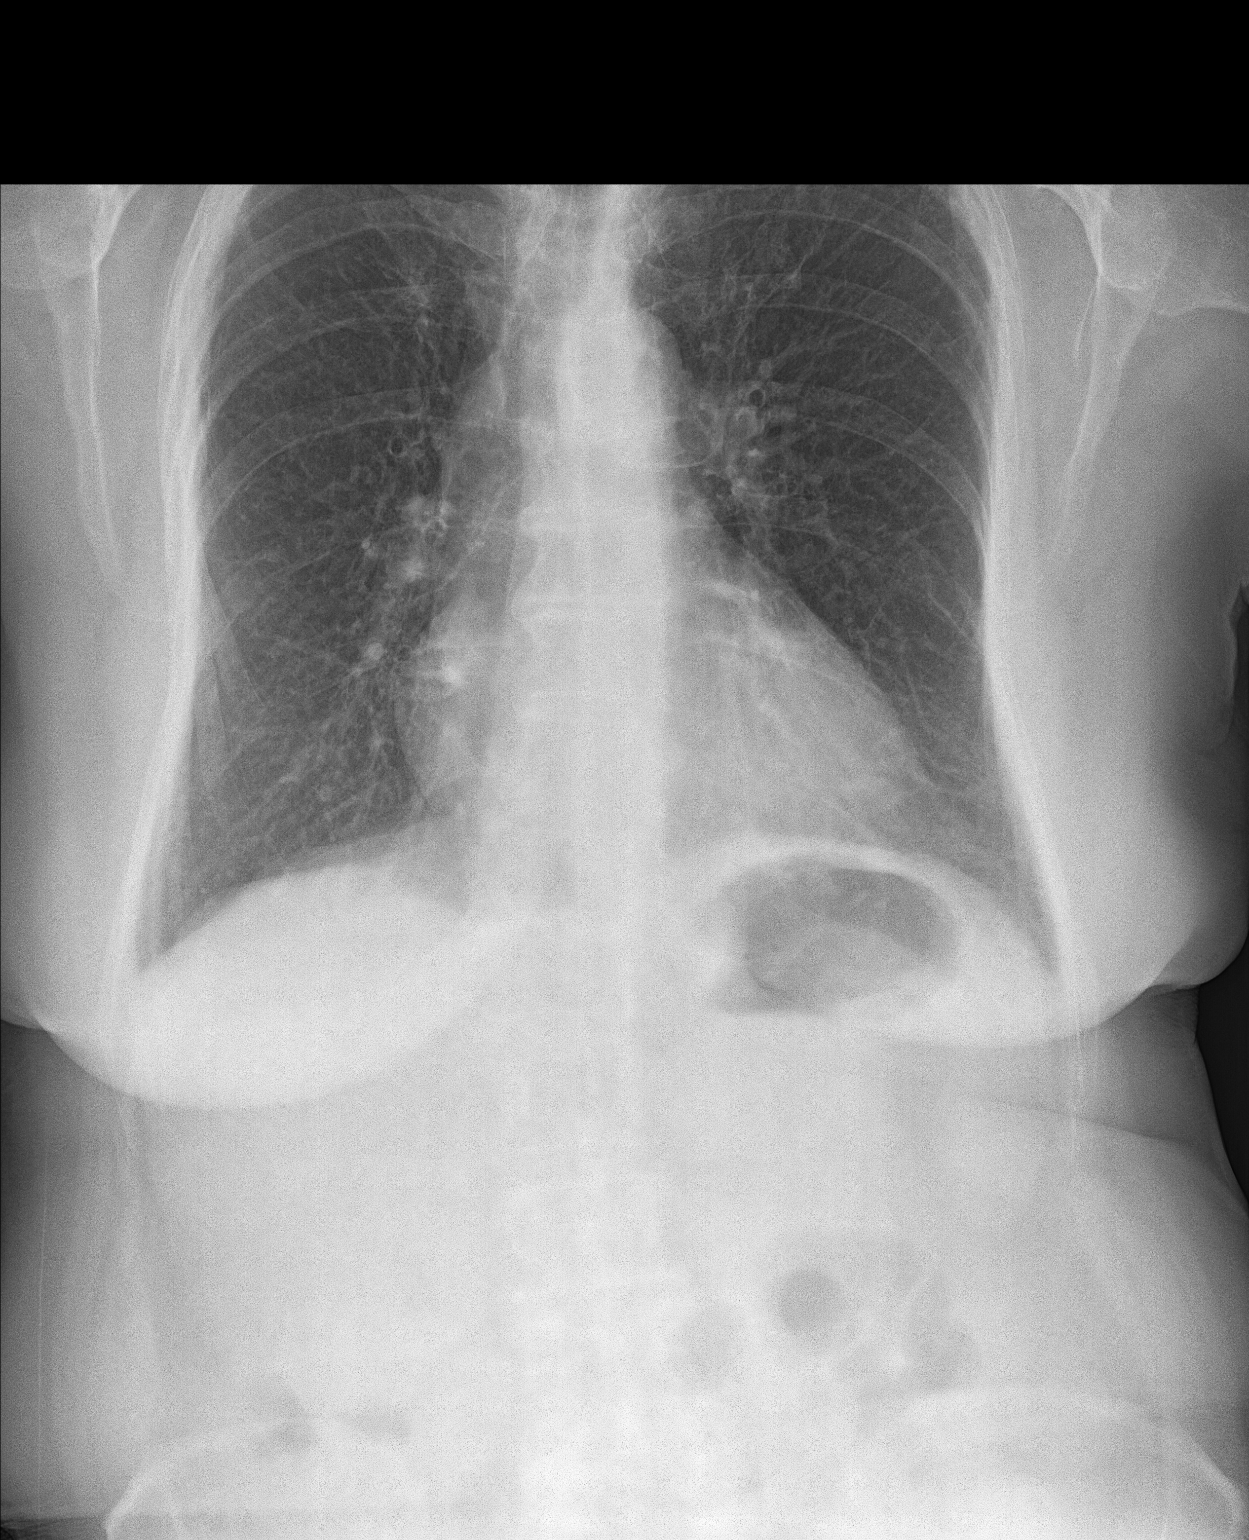

[chest lat]
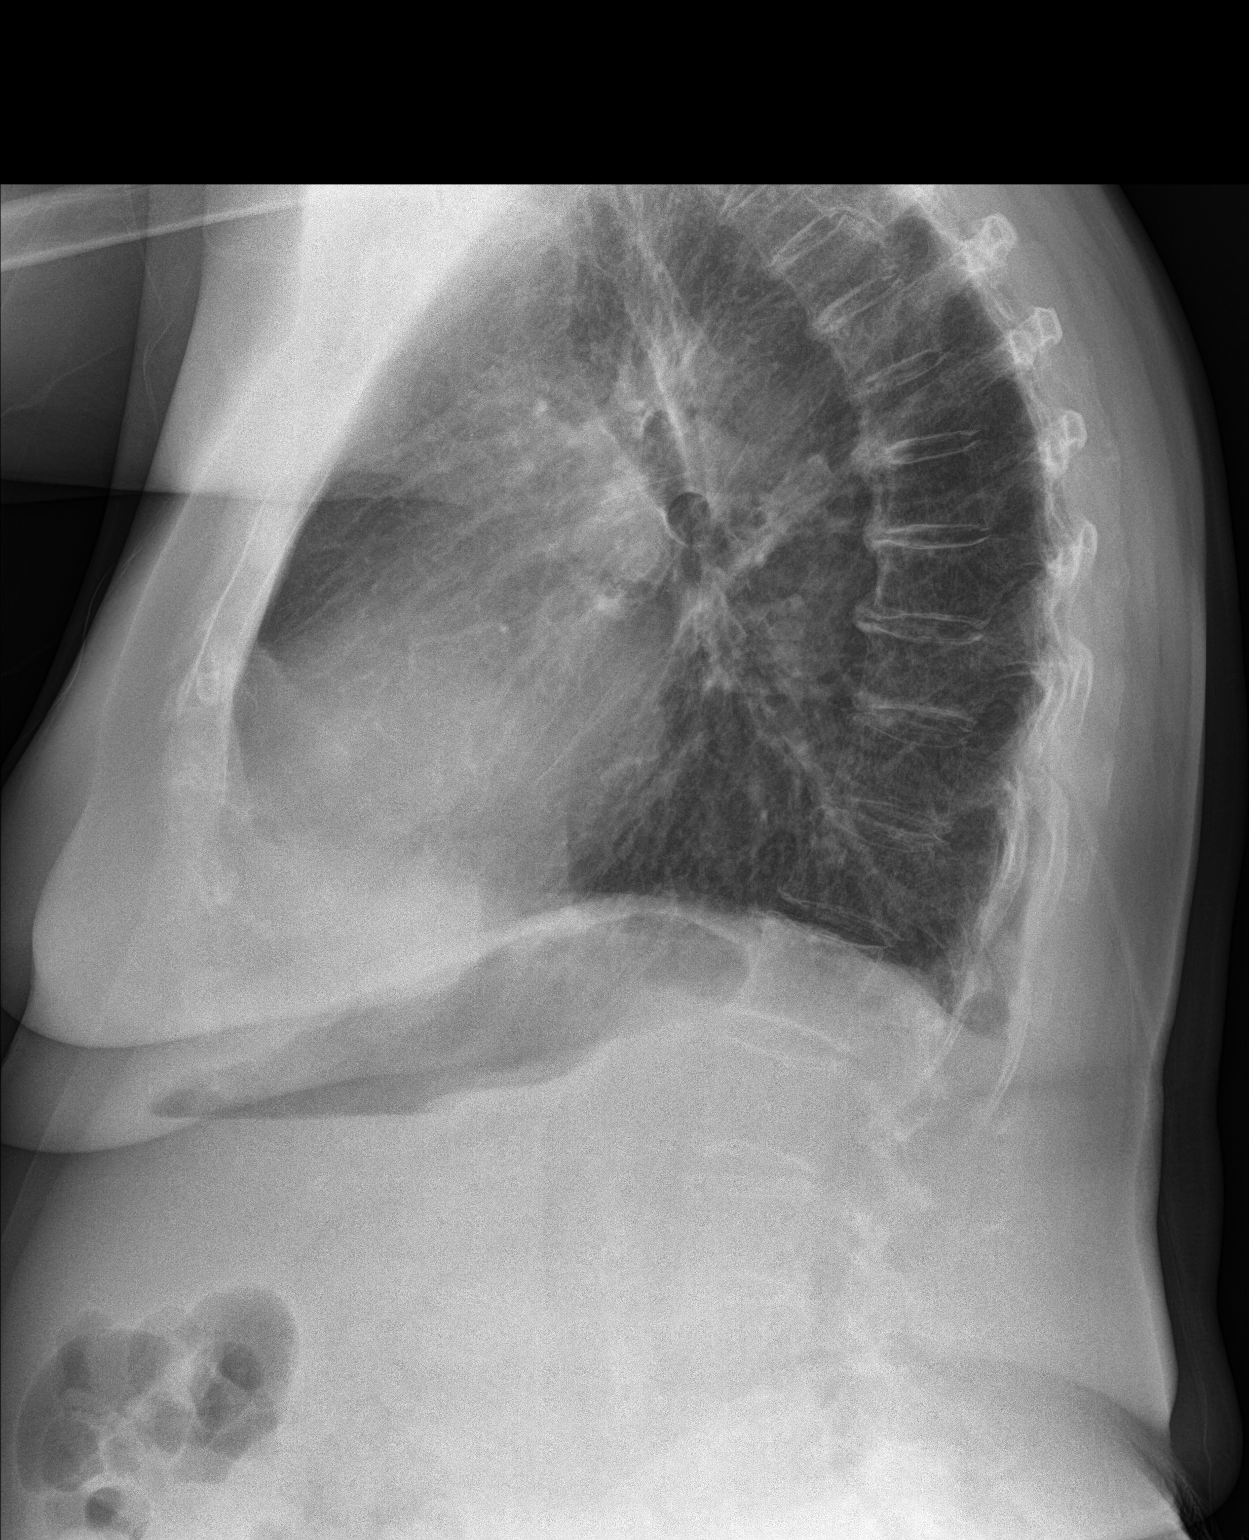

[2 of 2 positions shown; findings below may reference images not displayed]

FINDINGS: The lungs are mildly hyperinflated. The interstitial markings are
coarse though stable. There is no alveolar infiltrate or pleural
effusion. There is stable biapical pleural thickening. The heart is
top-normal in size. The pulmonary vascularity is normal. There is
calcification in the wall of the aortic arch. There is multilevel
degenerative disc disease of the thoracic spine.
IMPRESSION: Chronic bronchitic changes, stable. No pneumonia, CHF, nor other
acute cardiopulmonary abnormality.

## 2018-04-22 MED ORDER — CEFDINIR 300 MG PO CAPS: 300.0000 mg | ORAL_CAPSULE | Freq: Two times a day (BID) | ORAL | 0 refills | Status: DC

## 2018-04-22 NOTE — Progress Notes (Signed)
Subjective:    Patient ID: Melissa Carpenter, female    DOB: [birth year], 84 y.o.   MRN: [id number]  HPI Pt here for follow up and management of chronic medical problems which includes hypertension and hyperlipidemia. She is taking medication regularly.  The patient's biggest complaint is some right eyelid redness.  She we will get a chest x-ray today and has had lab work done which we will review with her during the visit today.  All liver function tests were normal.  The CBC was normal with a good hemoglobin normal white blood cell count and adequate platelet count.  The blood sugar slightly elevated at 1007 with a normal creatinine and electrolytes including potassium.  Cholesterol numbers were good except for elevated triglycerides at 157.  The LDL-C was excellent at 50 and the good cholesterol was within normal limits at 41.  The vitamin D level was good at 53.4.  She brings in blood sugars and blood pressures from the outside and these will be scanned into the record.  They were all good.  Patient today is tired because of all the running she has had to do with her husband who has had carcinoma of the sinus.  She has not been eating as healthy or getting as much exercise.  Today she denies any chest pain pressure tightness or shortness of breath.  She denies any trouble with swallowing heartburn indigestion nausea vomiting diarrhea or blood in the stool and is passing her water without problems.  She says she does drink a lot of water.  I told her this was good.  She is had the right upper eyelid redness for several days.  Her outside blood sugar readings and blood pressures were good and that will be scanned into the record.     Patient Active Problem List   Diagnosis Date Noted  . Normal coronary arteries 06/20/2017  . Palpitations 02/21/2017  . Mild aortic stenosis 02/21/2017  . Pre-diabetes 02/01/2016  . Chronic venous insufficiency 09/14/2015  . Metabolic syndrome 47/04/6282  .  Gastroesophageal reflux disease with hiatal hernia 07/21/2013  . Osteoporosis 03/05/2013  . Edema   . Carotid artery disease ([place])   . Dyslipidemia   . Mitral valve prolapse   . Ejection fraction   . Aortic insufficiency   . Essential hypertension   . Chest pain    Outpatient Encounter Medications as of 04/22/2018  Medication Sig  . ACCU-CHEK SOFTCLIX LANCETS lancets Use to check blood sugars daily  . amLODipine-atorvastatin (CADUET) 10-40 MG tablet TAKE 1 TABLET BY MOUTH EVERY DAY  . aspirin 81 MG tablet Take 81 mg by mouth daily.  . Blood Glucose Monitoring Suppl (ACCU-CHEK AVIVA PLUS) w/Device KIT 1 Units by Does not apply route daily.  . Calcium Carbonate-Vitamin D (CALTRATE 600+D) 600-400 MG-UNIT per tablet Take 1 tablet by mouth daily.  . cholecalciferol (VITAMIN D) 400 UNITS TABS Take 1,000 Units by mouth.    . fish oil-omega-3 fatty acids 1000 MG capsule Take 1 g by mouth daily.    . fluticasone (FLONASE) 50 MCG/ACT nasal spray SPRAY 2 SPRAYS INTO EACH NOSTRIL EVERY DAY  . glucose blood (ACCU-CHEK AVIVA PLUS) test strip Use to check blood sugars daily  . metoprolol succinate (TOPROL-XL) 100 MG 24 hr tablet TAKE 1 TABLET (100 MG TOTAL) BY MOUTH DAILY. TAKE WITH OR IMMEDIATELY FOLLOWING A MEAL.  . metoprolol tartrate (LOPRESSOR) 25 MG tablet Take 1 tablet (25 mg total) by mouth as needed.  . Multiple Vitamin (  MULTIVITAMIN) tablet Take 1 tablet by mouth daily.    Marland Kitchen olmesartan (BENICAR) 40 MG tablet Take 1 tablet (40 mg total) by mouth daily.  Marland Kitchen telmisartan (MICARDIS) 40 MG tablet TAKE 1 TABLET BY MOUTH EVERY DAY  . triamcinolone cream (KENALOG) 0.1 % Apply 1 application topically 2 (two) times daily.   No facility-administered encounter medications on file as of 04/22/2018.      Review of Systems  Constitutional: Negative.   HENT: Negative.   Eyes: Positive for redness (lid - right ).  Respiratory: Negative.   Cardiovascular: Negative.   Gastrointestinal: Negative.     Endocrine: Negative.   Genitourinary: Negative.   Musculoskeletal: Negative.   Skin: Negative.   Allergic/Immunologic: Negative.   Neurological: Negative.   Hematological: Negative.   Psychiatric/Behavioral: Negative.        Objective:   Physical Exam  Constitutional: She is oriented to person, place, and time. She appears well-developed and well-nourished. No distress.  The patient is pleasant and alert and fatigued and tired from running to various specialist and helping her husband recover from his cancer.  HENT:  Head: Normocephalic and atraumatic.  Left Ear: External ear normal.  Nose: Nose normal.  Mouth/Throat: Oropharynx is clear and moist.  Myringotomy tube still in place in right TM.  Eyes: Pupils are equal, round, and reactive to light. Conjunctivae and EOM are normal. Right eye exhibits no discharge. Left eye exhibits no discharge. No scleral icterus.  Patient is due for follow-up with ophthalmology and will schedule this visit  Neck: Normal range of motion. Neck supple. No thyromegaly present.  No bruits thyromegaly or anterior cervical adenopathy.  Patient did notice some slight tenderness in the right anterior cervical area but no masses lumps or nodes were palpated.  Cardiovascular: Normal rate, regular rhythm and intact distal pulses.  Murmur heard. Heart has a regular rate and rhythm at 60/min with a grade 2/6 systolic ejection murmur.  Pulmonary/Chest: Effort normal and breath sounds normal. She has no wheezes. She has no rales.  Clear anteriorly and posteriorly  Abdominal: Soft. Bowel sounds are normal. She exhibits no mass. There is no tenderness. There is no rebound and no guarding.  No liver or spleen enlargement.  No epigastric tenderness masses bruits or inguinal adenopathy.  Musculoskeletal: Normal range of motion. She exhibits no edema.  Lymphadenopathy:    She has no cervical adenopathy.  Neurological: She is alert and oriented to person, place, and  time. She has normal reflexes.  Skin: Skin is warm and dry. No rash noted.  Psychiatric: She has a normal mood and affect. Her behavior is normal. Judgment and thought content normal.  The patient's mood affect and behavior were all normal for her.  Nursing note and vitals reviewed.  BP 138/62 (BP Location: Left Arm)   Pulse 62   Temp 97.6 F (36.4 C) (Oral)   Ht '4\' 10"'  (1.473 m)   Wt 143 lb (64.9 kg)   BMI 29.89 kg/m         Assessment & Plan:  1. Essential hypertension -The blood pressure is good today and she will continue with current treatment - DG Chest 2 View; Future  2. Pure hypercholesterolemia -All cholesterol numbers are good other than the triglycerides being slightly elevated but improved from previously.  Patient has not had opportunity to watch her diet as closely and has been on the run a lot. - DG Chest 2 View; Future  3. Vitamin D deficiency -Continue with vitamin D  replacement  4. Gastroesophageal reflux disease, esophagitis presence not specified -No complaints with reflux or heartburn today and she will continue with watching her diet more closely.  5. Pre-diabetes -Continue to follow aggressive therapeutic lifestyle changes to achieve weight loss through diet and exercise  6. Vasomotor flushing -Add thyroid profile to blood work that has already been reviewed  Patient Instructions                       Medicare Annual Wellness Visit  [facility] and the medical providers at [facility] strive to bring you the best medical care.  In doing so we not only want to address your current medical conditions and concerns but also to detect new conditions early and prevent illness, disease and health-related problems.    Medicare offers a yearly Wellness Visit which allows our clinical staff to assess your need for preventative services including immunizations, lifestyle education, counseling to decrease risk of preventable diseases and  screening for fall risk and other medical concerns.    This visit is provided free of charge (no copay) for all Medicare recipients. The clinical pharmacists at [facility] have begun to conduct these Wellness Visits which will also include a thorough review of all your medications.    As you primary medical provider recommend that you make an appointment for your Annual Wellness Visit if you have not done so already this year.  You may set up this appointment before you leave today or you may call back ([phone]) and schedule an appointment.  Please make sure when you call that you mention that you are scheduling your Annual Wellness Visit with the clinical pharmacist so that the appointment may be made for the proper length of time.     Continue current medications. Continue good therapeutic lifestyle changes which include good diet and exercise. Fall precautions discussed with patient. If an FOBT was given today- please return it to our front desk. If you are over 63 years old - you may need Prevnar 31 or the adult Pneumonia vaccine.  **Flu shots are available--- please call and schedule a FLU-CLINIC appointment**  After your visit with [place] today you will receive a survey in the mail or online from [employer] regarding your care with [place]. Please take a moment to fill this out. Your feedback is very important to [place] as you can help [place] better understand your patient needs as well as improve your experience and satisfaction. WE CARE ABOUT YOU!!!   Continue to monitor blood pressures and blood sugars at home Continue to drink plenty of water and stay well-hydrated Continue current treatment and take antibiotic along with using warm wet compresses 20 minutes 3 or 4 times a day to the affected eye If the eye is not a lot better in a week to 10 days please call us back and we can arrange for you to see an neurologist. We are adding a thyroid profile to your blood work and we  will call with that result comes in   Arrie Senate MD

## 2018-04-22 NOTE — Patient Instructions (Addendum)
Medicare Annual Wellness Visit  [facility] and the medical providers at [facility] strive to bring you the best medical care.  In doing so we not only want to address your current medical conditions and concerns but also to detect new conditions early and prevent illness, disease and health-related problems.    Medicare offers a yearly Wellness Visit which allows our clinical staff to assess your need for preventative services including immunizations, lifestyle education, counseling to decrease risk of preventable diseases and screening for fall risk and other medical concerns.    This visit is provided free of charge (no copay) for all Medicare recipients. The clinical pharmacists at [facility] have begun to conduct these Wellness Visits which will also include a thorough review of all your medications.    As you primary medical provider recommend that you make an appointment for your Annual Wellness Visit if you have not done so already this year.  You may set up this appointment before you leave today or you may call back ([phone]) and schedule an appointment.  Please make sure when you call that you mention that you are scheduling your Annual Wellness Visit with the clinical pharmacist so that the appointment may be made for the proper length of time.     Continue current medications. Continue good therapeutic lifestyle changes which include good diet and exercise. Fall precautions discussed with patient. If an FOBT was given today- please return it to our front desk. If you are over 37 years old - you may need Prevnar 24 or the adult Pneumonia vaccine.  **Flu shots are available--- please call and schedule a FLU-CLINIC appointment**  After your visit with [place] today you will receive a survey in the mail or online from [employer] regarding your care with [place]. Please take a moment to fill this out. Your feedback is very  important to [place] as you can help [place] better understand your patient needs as well as improve your experience and satisfaction. WE CARE ABOUT YOU!!!   Continue to monitor blood pressures and blood sugars at home Continue to drink plenty of water and stay well-hydrated Continue current treatment and take antibiotic along with using warm wet compresses 20 minutes 3 or 4 times a day to the affected eye If the eye is not a lot better in a week to 10 days please call us back and we can arrange for you to see an neurologist. We are adding a thyroid profile to your blood work and we will call with that result comes in

## 2018-04-23 LAB — SPECIMEN STATUS REPORT

## 2018-04-23 LAB — THYROID PANEL WITH TSH
Free Thyroxine Index: 2.3 (ref 1.2–4.9)
T3 Uptake Ratio: 27 % (ref 24–39)
T4 TOTAL: 8.4 ug/dL (ref 4.5–12.0)
TSH: 2.34 u[IU]/mL (ref 0.450–4.500)

## 2018-05-28 ENCOUNTER — Ambulatory Visit (INDEPENDENT_AMBULATORY_CARE_PROVIDER_SITE_OTHER): Payer: Medicare Other | Admitting: *Deleted

## 2018-05-28 DIAGNOSIS — Z23 Encounter for immunization: Secondary | ICD-10-CM | POA: Diagnosis not present

## 2018-05-30 ENCOUNTER — Other Ambulatory Visit: Payer: Self-pay | Admitting: Family Medicine

## 2018-06-11 ENCOUNTER — Ambulatory Visit (INDEPENDENT_AMBULATORY_CARE_PROVIDER_SITE_OTHER): Payer: Medicare Other | Admitting: *Deleted

## 2018-06-11 VITALS — BP 127/62 | HR 64 | Temp 97.0°F | Ht <= 58 in | Wt 144.0 lb

## 2018-06-11 DIAGNOSIS — Z0000 Encounter for general adult medical examination without abnormal findings: Secondary | ICD-10-CM

## 2018-06-11 NOTE — Progress Notes (Addendum)
Subjective:   Britini Garcilazo is a 84 y.o. female who presents for Medicare Annual (Subsequent) preventive examination. She is retired from [employer]. She enjoys working word puzzles, sewing and baking. Her exercise includes yard work and housework, she works hard about 1 hour daily about 6 days a week. She states that her diet is semi-healthy and she generally eats 3 meals a day. She is active in church. She is married and lives at home with her husband, Vonna Drafts. They have 4 children and 5 grandchildren. All except one, live local. She does not have any pets and fall hazards were discussed today. She states that her health is about the same as it was a year ago.  Cardiac Risk Factors include: advanced age (>44mn, >>61women)     Objective:     Vitals: BP 127/62 (BP Location: Left Arm)   Pulse 64   Temp (!) 97 F (36.1 C) (Oral)   Ht '4\' 10"'  (1.473 m)   Wt 144 lb (65.3 kg)   BMI 30.10 kg/m   Body mass index is 30.1 kg/m.  Advanced Directives 06/11/2018 03/08/2016 05/28/2015 03/08/2015  Does Patient Have a Medical Advance Directive? Yes Yes Yes Yes  Type of AParamedicof AAlakanukLiving will HFifty-SixLiving will [place]irk[place]Living will [place]enn[place]Living will  Does patient want to make changes to medical advance directive? No - Patient declined No - Patient declined - -  Copy of [place] Chart? No - copy requested Yes No - copy requested No - copy requested    Tobacco Social History   Tobacco Use  Smoking Status Never Smoker  Smokeless Tobacco Never Used     Counseling given: Not Answered   Past Medical History:  Diagnosis Date  . Allergy   . Aortic insufficiency    Mild, echo, December, 2009  . Arthritis   . Carotid artery disease ([place]    , Doppler, 2003  . Cataract   . Cellulitis   . Chest pain    Catheterization, 2003, no significant CAD  . Diastolic dysfunction     Mild diastolic dysfunction, echo, December, 2012  . DJD (degenerative joint disease)   . Dyslipidemia   . Edema    November, 2012  . GERD (gastroesophageal reflux disease)   . Heart murmur   . Hyperlipidemia   . Hypertension   . Mitral valve prolapse    Echo, 2009, very mild intermittent prolapse of the posterior leaflet, no MR  . Osteoporosis   . Prediabetes   . Tachycardia    Nighttime tachycardia, February, 2014  . Thyroid nodule    Past Surgical History:  Procedure Laterality Date  . ABDOMINAL HYSTERECTOMY  1964  . APPENDECTOMY  1946  . BIOPSY THYROID Left   . CARDIAC CATHETERIZATION  sept 2003   no significant cad  . CATARACT EXTRACTION, BILATERAL    . COLONOSCOPY    . NASAL SINUS SURGERY    . TONSILLECTOMY     Family History  Problem Relation Age of Onset  . Heart attack Mother   . Hypertension Mother   . Throat cancer Mother        THROAT / VOCAL CORD  . Heart disease Mother   . Hypertension Father   . Heart disease Father   . Kidney disease Father        failure  . Bone cancer Sister   . Stroke Sister   .  Bone cancer Grandchild        in rib cage  . Hypertension Sister   . Metabolic syndrome Sister        pre diabetes  . GER disease Sister   . Heart disease Sister        CAD  . Heart attack Brother   . Cirrhosis Brother   . Breast cancer Sister   . Bone cancer Sister   . Hyperlipidemia Sister   . Heart attack Sister   . Stroke Son   . Colitis Neg Hx   . Esophageal cancer Neg Hx   . Stomach cancer Neg Hx   . Rectal cancer Neg Hx    Social History   Socioeconomic History  . Marital status: Married    Spouse name: Not on file  . Number of children: 4  . Years of education: Not on file  . Highest education level: Not on file  Occupational History  . Occupation: retired    Fish farm manager: [employer]  Social Needs  . Financial resource strain: Not on file  . Food insecurity:    Worry: Not on file    Inability: Not on file  .  Transportation needs:    Medical: Not on file    Non-medical: Not on file  Tobacco Use  . Smoking status: Never Smoker  . Smokeless tobacco: Never Used  Substance and Sexual Activity  . Alcohol use: No    Alcohol/week: 0.0 standard drinks  . Drug use: No  . Sexual activity: Never  Lifestyle  . Physical activity:    Days per week: Not on file    Minutes per session: Not on file  . Stress: Not on file  Relationships  . Social connections:    Talks on phone: Not on file    Gets together: Not on file    Attends religious service: Not on file    Active member of club or organization: Not on file    Attends meetings of clubs or organizations: Not on file    Relationship status: Not on file  Other Topics Concern  . Not on file  Social History Narrative  . Not on file    Outpatient Encounter Medications as of 06/11/2018  Medication Sig  . ACCU-CHEK SOFTCLIX LANCETS lancets Use to check blood sugars daily  . amLODipine-atorvastatin (CADUET) 10-40 MG tablet TAKE 1 TABLET BY MOUTH EVERY DAY  . aspirin 81 MG tablet Take 81 mg by mouth daily.  . Blood Glucose Monitoring Suppl (ACCU-CHEK AVIVA PLUS) w/Device KIT 1 Units by Does not apply route daily.  . Calcium Carbonate-Vitamin D (CALTRATE 600+D) 600-400 MG-UNIT per tablet Take 1 tablet by mouth daily.  . cholecalciferol (VITAMIN D) 400 UNITS TABS Take 1,000 Units by mouth.    . fish oil-omega-3 fatty acids 1000 MG capsule Take 1 g by mouth daily.    . fluticasone (FLONASE) 50 MCG/ACT nasal spray SPRAY 2 SPRAYS INTO EACH NOSTRIL EVERY DAY  . glucose blood (ACCU-CHEK AVIVA PLUS) test strip Use to check blood sugars daily  . metoprolol succinate (TOPROL-XL) 100 MG 24 hr tablet TAKE 1 TABLET (100 MG TOTAL) BY MOUTH DAILY. TAKE WITH OR IMMEDIATELY FOLLOWING A MEAL.  . metoprolol tartrate (LOPRESSOR) 25 MG tablet Take 1 tablet (25 mg total) by mouth as needed.  . Multiple Vitamin (MULTIVITAMIN) tablet Take 1 tablet by mouth daily.    Marland Kitchen  telmisartan (MICARDIS) 40 MG tablet TAKE 1 TABLET BY MOUTH EVERY DAY  .  triamcinolone cream (KENALOG) 0.1 % Apply 1 application topically 2 (two) times daily.  . [DISCONTINUED] cefdinir (OMNICEF) 300 MG capsule Take 1 capsule (300 mg total) by mouth 2 (two) times daily. 1 po BID  . [DISCONTINUED] olmesartan (BENICAR) 40 MG tablet Take 1 tablet (40 mg total) by mouth daily.   No facility-administered encounter medications on file as of 06/11/2018.     Activities of Daily Living In your present state of health, do you have any difficulty performing the following activities: 06/11/2018  Hearing? N  Vision? Y  Comment wears RX glasses   Difficulty concentrating or making decisions? N  Walking or climbing stairs? N  Dressing or bathing? N  Doing errands, shopping? N  Preparing Food and eating ? N  Using the Toilet? N  In the past six months, have you accidently leaked urine? N  Do you have problems with loss of bowel control? N  Managing your Medications? N  Managing your Finances? N  Housekeeping or managing your Housekeeping? N  Some recent data might be hidden    Patient Care Team: Chipper Herb, MD as PCP - General (Family Medicine) Paralee Cancel, MD as Consulting Physician (Orthopedic Surgery) Minus Breeding, MD as Consulting Physician (Cardiology) Angelia Mould, MD as Consulting Physician (Vascular Surgery)    Assessment:   This is a routine wellness examination for [facility].  Exercise Activities and Dietary recommendations Current Exercise Habits: Home exercise routine, Type of exercise: walking, Time (Minutes): 60, Frequency (Times/Week): 6, Weekly Exercise (Minutes/Week): 360, Intensity: Mild, Exercise limited by: None identified  Goals    . Prevent falls    . Weight (lb) < 135 lb (61.2 kg)       Fall Risk Fall Risk  06/11/2018 04/22/2018 08/28/2017 07/18/2017 07/13/2017  Falls in the past year? Yes Yes No No No  Number falls in past yr: 1 1 - - -  Injury with  Fall? No No - - -  Risk Factor Category  - - - - -  Follow up - - - - -   Is the patient's home free of loose throw rugs in walkways, pet beds, electrical cords, etc?   Fall hazards and risks were discussed today  Depression Screen PHQ 2/9 Scores 06/11/2018 04/22/2018 08/28/2017 07/18/2017  PHQ - 2 Score 0 0 0 0     Cognitive Function MMSE - Mini Mental State Exam 06/11/2018 07/13/2016 03/08/2016 03/08/2015  Orientation to time '5 5 5 5  ' Orientation to Place '5 5 5 5  ' Registration '3 3 3 3  ' Attention/ Calculation '5 5 5 5  ' Recall '3 3 3 3  ' Language- name 2 objects '2 2 2 2  ' Language- repeat '1 1 1 1  ' Language- follow 3 step command '3 3 3 3  ' Language- read & follow direction '1 1 1 1  ' Write a sentence '1 1 1 1  ' Copy design '1 1 1 1  ' Total score '30 30 30 30        ' Immunization History  Administered Date(s) Administered  . Influenza, High Dose Seasonal PF 05/08/2016, 06/12/2017, 05/28/2018  . Influenza,inj,Quad PF,6+ Mos 05/26/2013, 05/25/2014, 06/08/2015  . Pneumococcal Conjugate-13 07/30/2013  . Pneumococcal Polysaccharide-23 09/14/2004  . Tdap 05/15/2011    Qualifies for Shingles Vaccine? Declined   Screening Tests Health Maintenance  Topic Date Due  . MAMMOGRAM  09/11/2018  . DEXA SCAN  04/03/2019  . TETANUS/TDAP  05/14/2021  . INFLUENZA VACCINE  Completed  . PNA vac Low Risk  Adult  Completed    Cancer Screenings: Lung: Low Dose CT Chest recommended if Age 42-80 years, 30 pack-year currently smoking OR have quit w/in 15years. Patient does not qualify. Breast:  Up to date on Mammogram? Yes   Up to date of Bone Density/Dexa? Yes Colorectal: done  Additional Screenings:  declined Hepatitis C Screening:      Plan:    pt to Continue current medications. Continue good therapeutic lifestyle changes which include good diet and exercise. Fall precautions discussed with patient.   I have personally reviewed and noted the following in the patient's chart:   . Medical and  social history . Use of alcohol, tobacco or illicit drugs  . Current medications and supplements . Functional ability and status . Nutritional status . Physical activity . Advanced directives . List of other physicians . Hospitalizations, surgeries, and ER visits in previous 12 months . Vitals . Screenings to include cognitive, depression, and falls . Referrals and appointments  In addition, I have reviewed and discussed with patient certain preventive protocols, quality metrics, and best practice recommendations. A written personalized care plan for preventive services as well as general preventive health recommendations were provided to patient.     Bullins, Cameron Proud, LPN  [id number]  I have reviewed and agree with the above AWV documentation.   Marshe-Margaret Hassell Done, FNP

## 2018-06-11 NOTE — Patient Instructions (Signed)
  Melissa Carpenter , Thank you for taking time to come for your Medicare Wellness Visit. I appreciate your ongoing commitment to your health goals. Please review the following plan we discussed and let me know if I can assist you in the future.   These are the goals we discussed: Goals    . Prevent falls    . Weight (lb) < 135 lb (61.2 kg)       This is a list of the screening recommended for you and due dates:  Health Maintenance  Topic Date Due  . Mammogram  09/11/2018  . DEXA scan (bone density measurement)  04/03/2019  . Tetanus Vaccine  05/14/2021  . Flu Shot  Completed  . Pneumonia vaccines  Completed    Continue current medications. Continue good therapeutic lifestyle changes which include good diet and exercise. Fall precautions discussed with patient.  After your visit with [place] today you will receive a survey in the mail or online from [employer] regarding your care with [place]. Please take a moment to fill this out. Your feedback is very important to [place] as you can help [place] better understand your patient needs as well as improve your experience and satisfaction. WE CARE ABOUT YOU!!!

## 2018-06-26 ENCOUNTER — Encounter: Payer: Self-pay | Admitting: Pharmacist Clinician (PhC)/ Clinical Pharmacy Specialist

## 2018-06-26 ENCOUNTER — Ambulatory Visit (INDEPENDENT_AMBULATORY_CARE_PROVIDER_SITE_OTHER): Payer: Medicare Other | Admitting: Pharmacist Clinician (PhC)/ Clinical Pharmacy Specialist

## 2018-06-26 DIAGNOSIS — R7303 Prediabetes: Secondary | ICD-10-CM

## 2018-06-26 DIAGNOSIS — Z87898 Personal history of other specified conditions: Secondary | ICD-10-CM | POA: Diagnosis not present

## 2018-06-26 LAB — BAYER DCA HB A1C WAIVED: HB A1C: 6.3 % (ref <7.0)

## 2018-06-26 NOTE — Progress Notes (Signed)
Melissa Carpenter is an 84 year old female who is referred by Dr. Laurance Flatten for a visit to review her blood sugar log and counsel patient on pre-diabetes.  Patient was offered metformin at her last PCP office visit, but declined and does not want to take it at this time.  Home blood glucoses are fasting around 104 and 2 hours after eating 140's.  Her last A1C was 06/2017 and was 6.2%.  I had her A1C re-checked today and it is 6.3%.  A/P:  1.  Counseled patient on diet and exercise changes to implement to avoid onset of type diabetes.  Reviewed the A1C goal and what that meant as far as her home BG readings.  Total Time with patient:  30 minutes  Memory Argue, PharmD,. CPP, CLS

## 2018-06-27 ENCOUNTER — Telehealth: Payer: Self-pay | Admitting: Family Medicine

## 2018-06-27 NOTE — Telephone Encounter (Signed)
Spoke with pt regarding recommendation Pt saw clinical pharmacist on 06/26/2018 Will give HgbA1c results and recommendation to MB

## 2018-07-05 ENCOUNTER — Encounter: Payer: Self-pay | Admitting: *Deleted

## 2018-07-10 DIAGNOSIS — Z961 Presence of intraocular lens: Secondary | ICD-10-CM | POA: Diagnosis not present

## 2018-07-10 DIAGNOSIS — R7303 Prediabetes: Secondary | ICD-10-CM | POA: Diagnosis not present

## 2018-07-10 DIAGNOSIS — D3131 Benign neoplasm of right choroid: Secondary | ICD-10-CM | POA: Diagnosis not present

## 2018-07-10 DIAGNOSIS — H01024 Squamous blepharitis left upper eyelid: Secondary | ICD-10-CM | POA: Diagnosis not present

## 2018-07-10 DIAGNOSIS — H00021 Hordeolum internum right upper eyelid: Secondary | ICD-10-CM | POA: Diagnosis not present

## 2018-07-10 DIAGNOSIS — H01022 Squamous blepharitis right lower eyelid: Secondary | ICD-10-CM | POA: Diagnosis not present

## 2018-07-10 DIAGNOSIS — H04123 Dry eye syndrome of bilateral lacrimal glands: Secondary | ICD-10-CM | POA: Diagnosis not present

## 2018-07-10 DIAGNOSIS — H10413 Chronic giant papillary conjunctivitis, bilateral: Secondary | ICD-10-CM | POA: Diagnosis not present

## 2018-07-10 DIAGNOSIS — H01021 Squamous blepharitis right upper eyelid: Secondary | ICD-10-CM | POA: Diagnosis not present

## 2018-07-10 DIAGNOSIS — H01025 Squamous blepharitis left lower eyelid: Secondary | ICD-10-CM | POA: Diagnosis not present

## 2018-07-10 LAB — HM DIABETES EYE EXAM

## 2018-07-19 ENCOUNTER — Other Ambulatory Visit: Payer: Self-pay | Admitting: Family Medicine

## 2018-08-05 ENCOUNTER — Other Ambulatory Visit: Payer: Self-pay | Admitting: Family Medicine

## 2018-08-30 ENCOUNTER — Other Ambulatory Visit: Payer: Medicare Other

## 2018-08-30 DIAGNOSIS — E559 Vitamin D deficiency, unspecified: Secondary | ICD-10-CM

## 2018-08-30 DIAGNOSIS — I10 Essential (primary) hypertension: Secondary | ICD-10-CM | POA: Diagnosis not present

## 2018-08-30 DIAGNOSIS — E7800 Pure hypercholesterolemia, unspecified: Secondary | ICD-10-CM | POA: Diagnosis not present

## 2018-08-30 DIAGNOSIS — K219 Gastro-esophageal reflux disease without esophagitis: Secondary | ICD-10-CM

## 2018-08-31 LAB — CBC WITH DIFFERENTIAL/PLATELET
Basophils Absolute: 0 10*3/uL (ref 0.0–0.2)
Basos: 0 %
EOS (ABSOLUTE): 0.1 10*3/uL (ref 0.0–0.4)
Eos: 2 %
Hematocrit: 36 % (ref 34.0–46.6)
Hemoglobin: 12.5 g/dL (ref 11.1–15.9)
IMMATURE GRANS (ABS): 0 10*3/uL (ref 0.0–0.1)
Immature Granulocytes: 0 %
Lymphocytes Absolute: 1.6 10*3/uL (ref 0.7–3.1)
Lymphs: 30 %
MCH: 30.4 pg (ref 26.6–33.0)
MCHC: 34.7 g/dL (ref 31.5–35.7)
MCV: 88 fL (ref 79–97)
Monocytes Absolute: 0.6 10*3/uL (ref 0.1–0.9)
Monocytes: 11 %
Neutrophils Absolute: 3 10*3/uL (ref 1.4–7.0)
Neutrophils: 57 %
Platelets: 210 10*3/uL (ref 150–450)
RBC: 4.11 x10E6/uL (ref 3.77–5.28)
RDW: 13.1 % (ref 11.7–15.4)
WBC: 5.3 10*3/uL (ref 3.4–10.8)

## 2018-08-31 LAB — LIPID PANEL
Chol/HDL Ratio: 3.2 ratio (ref 0.0–4.4)
Cholesterol, Total: 132 mg/dL (ref 100–199)
HDL: 41 mg/dL (ref >39)
LDL Calculated: 54 mg/dL (ref 0–99)
Triglycerides: 185 mg/dL — ABNORMAL HIGH (ref 0–149)
VLDL Cholesterol Cal: 37 mg/dL (ref 5–40)

## 2018-08-31 LAB — HEPATIC FUNCTION PANEL
ALT: 31 IU/L (ref 0–32)
AST: 30 IU/L (ref 0–40)
Albumin: 4.8 g/dL — ABNORMAL HIGH (ref 3.5–4.7)
Alkaline Phosphatase: 103 IU/L (ref 39–117)
Bilirubin Total: 0.3 mg/dL (ref 0.0–1.2)
Bilirubin, Direct: 0.1 mg/dL (ref 0.00–0.40)
Total Protein: 6.9 g/dL (ref 6.0–8.5)

## 2018-08-31 LAB — BMP8+EGFR
BUN / CREAT RATIO: 17 (ref 12–28)
BUN: 9 mg/dL (ref 8–27)
CHLORIDE: 104 mmol/L (ref 96–106)
CO2: 22 mmol/L (ref 20–29)
Calcium: 9.6 mg/dL (ref 8.7–10.3)
Creatinine, Ser: 0.53 mg/dL — ABNORMAL LOW (ref 0.57–1.00)
GFR calc Af Amer: 102 mL/min/{1.73_m2} (ref >59)
GFR calc non Af Amer: 89 mL/min/{1.73_m2} (ref >59)
GLUCOSE: 99 mg/dL (ref 65–99)
POTASSIUM: 3.9 mmol/L (ref 3.5–5.2)
SODIUM: 143 mmol/L (ref 134–144)

## 2018-08-31 LAB — VITAMIN D 25 HYDROXY (VIT D DEFICIENCY, FRACTURES): Vit D, 25-Hydroxy: 46.5 ng/mL (ref 30.0–100.0)

## 2018-09-05 ENCOUNTER — Encounter: Payer: Self-pay | Admitting: Family Medicine

## 2018-09-05 ENCOUNTER — Ambulatory Visit (INDEPENDENT_AMBULATORY_CARE_PROVIDER_SITE_OTHER): Payer: Medicare Other | Admitting: Family Medicine

## 2018-09-05 VITALS — BP 137/60 | HR 58 | Temp 97.4°F | Ht <= 58 in | Wt 142.0 lb

## 2018-09-05 DIAGNOSIS — E559 Vitamin D deficiency, unspecified: Secondary | ICD-10-CM

## 2018-09-05 DIAGNOSIS — I10 Essential (primary) hypertension: Secondary | ICD-10-CM | POA: Diagnosis not present

## 2018-09-05 DIAGNOSIS — K219 Gastro-esophageal reflux disease without esophagitis: Secondary | ICD-10-CM | POA: Diagnosis not present

## 2018-09-05 DIAGNOSIS — E7800 Pure hypercholesterolemia, unspecified: Secondary | ICD-10-CM | POA: Diagnosis not present

## 2018-09-05 MED ORDER — ICOSAPENT ETHYL 1 G PO CAPS: 2.0000 | ORAL_CAPSULE | Freq: Two times a day (BID) | ORAL | 3 refills | Status: DC

## 2018-09-05 NOTE — Addendum Note (Signed)
Addended by: Zannie Cove on: 09/05/2018 11:54 AM   Modules accepted: Orders

## 2018-09-05 NOTE — Progress Notes (Signed)
Subjective:    Patient ID: Melissa Carpenter, female    DOB: [birth year], 85 y.o.   MRN: [id number]  HPI  Pt here for follow up and management of chronic medical problems which includes hypertension and hyperlipidemia. She is taking medication regularly.  The patient is doing well overall and only complains that her blood sugars are varying a lot.  She did bring in some blood sugars from the outside as well as blood pressures and overall they look pretty good and I am pleased with those.  I will be scanned into the record.  Her blood pressures are up and down a little bit but generally pretty good overall.  She has had lab work done this will be reviewed with her during the visit today.  Her lipid panel which was a traditional panel was excellent except the continued problem with elevation of the triglycerides.  The good cholesterol was good and the LDL-C was good at 54.  The blood sugar was good at 99 and the creatinine and all of the electrolytes were good.  Her creatinine always runs a little bit on the low side.  The CBC had a normal white blood cell count a stable hemoglobin at 12.5 and an adequate platelet count.  The vitamin D level was good at 46.5.  All liver function tests were good except the albumin was up by 1/10 of a point and this is been the case in the past.  Past year because of her husband's illness but everybody seems to be doing better now.   Patient Active Problem List   Diagnosis Date Noted  . Normal coronary arteries 06/20/2017  . Palpitations 02/21/2017  . Mild aortic stenosis 02/21/2017  . Pre-diabetes 02/01/2016  . Chronic venous insufficiency 09/14/2015  . Metabolic syndrome 79/89/2119  . Gastroesophageal reflux disease with hiatal hernia 07/21/2013  . Osteoporosis 03/05/2013  . Edema   . Carotid artery disease ([place])   . Dyslipidemia   . Mitral valve prolapse   . Ejection fraction   . Aortic insufficiency   . Essential hypertension   . Chest pain    Outpatient  Encounter Medications as of 09/05/2018  Medication Sig  . ACCU-CHEK SOFTCLIX LANCETS lancets Use to check blood sugars daily  . amLODipine-atorvastatin (CADUET) 10-40 MG tablet TAKE 1 TABLET BY MOUTH EVERY DAY  . aspirin 81 MG tablet Take 81 mg by mouth daily.  . Blood Glucose Monitoring Suppl (ACCU-CHEK AVIVA PLUS) w/Device KIT 1 Units by Does not apply route daily.  . Calcium Carbonate-Vitamin D (CALTRATE 600+D) 600-400 MG-UNIT per tablet Take 1 tablet by mouth daily.  . cholecalciferol (VITAMIN D) 400 UNITS TABS Take 1,000 Units by mouth.    . fish oil-omega-3 fatty acids 1000 MG capsule Take 1 g by mouth daily.    . fluticasone (FLONASE) 50 MCG/ACT nasal spray SPRAY 2 SPRAYS INTO EACH NOSTRIL EVERY DAY  . glucose blood (ACCU-CHEK AVIVA PLUS) test strip Use to check blood sugars daily  . meloxicam (MOBIC) 7.5 MG tablet Take 1 tablet by mouth 2 (two) times daily.  . metoprolol succinate (TOPROL-XL) 100 MG 24 hr tablet TAKE 1 TABLET (100 MG TOTAL) BY MOUTH DAILY. TAKE WITH OR IMMEDIATELY FOLLOWING A MEAL.  . metoprolol tartrate (LOPRESSOR) 25 MG tablet Take 1 tablet (25 mg total) by mouth as needed.  . Multiple Vitamin (MULTIVITAMIN) tablet Take 1 tablet by mouth daily.    Marland Kitchen telmisartan (MICARDIS) 40 MG tablet TAKE 1 TABLET BY MOUTH EVERY DAY  .  triamcinolone cream (KENALOG) 0.1 % Apply 1 application topically 2 (two) times daily.   No facility-administered encounter medications on file as of 09/05/2018.       Review of Systems  Constitutional: Negative.   HENT: Negative.   Eyes: Negative.   Respiratory: Negative.   Cardiovascular: Negative.   Gastrointestinal: Negative.   Endocrine: Negative.        BS up and down  Genitourinary: Negative.   Musculoskeletal: Negative.   Skin: Negative.   Allergic/Immunologic: Negative.   Neurological: Negative.   Hematological: Negative.   Psychiatric/Behavioral: Negative.        Objective:   Physical Exam Vitals signs and nursing note  reviewed.  Constitutional:      Appearance: Normal appearance. She is well-developed. She is obese. She is not ill-appearing.  HENT:     Head: Normocephalic and atraumatic.     Right Ear: Tympanic membrane, ear canal and external ear normal. There is no impacted cerumen.     Left Ear: Tympanic membrane, ear canal and external ear normal. There is no impacted cerumen.     Nose: Nose normal. No congestion or rhinorrhea.     Mouth/Throat:     Mouth: Mucous membranes are moist.     Pharynx: Oropharynx is clear. No oropharyngeal exudate or posterior oropharyngeal erythema.  Eyes:     General: No scleral icterus.       Right eye: No discharge.        Left eye: No discharge.     Extraocular Movements: Extraocular movements intact.     Conjunctiva/sclera: Conjunctivae normal.     Pupils: Pupils are equal, round, and reactive to light.  Neck:     Musculoskeletal: Normal range of motion and neck supple.     Thyroid: No thyromegaly.     Vascular: Carotid bruit present. No JVD.     Comments: No  thyromegaly or anterior cervical adenopathy.  There is a faint carotid bruit but most likely radiates from the systolic ejection murmur in her heart. Cardiovascular:     Rate and Rhythm: Normal rate and regular rhythm.     Heart sounds: Murmur present. No gallop.      Comments: Heart is regular at 72/min with good pedal pulses and no edema.  There is a grade 2/6 systolic ejection murmur.  This does seem to radiate to both carotids. Pulmonary:     Effort: Pulmonary effort is normal.     Breath sounds: Normal breath sounds. No wheezing or rales.     Comments: Clear anteriorly and posteriorly without rales or rhonchi. Abdominal:     General: Abdomen is flat. Bowel sounds are normal.     Palpations: Abdomen is soft. There is no mass.     Tenderness: There is abdominal tenderness.     Comments: There is slight epigastric tenderness without liver or spleen enlargement masses bruits or inguinal adenopathy    Musculoskeletal: Normal range of motion.        General: No tenderness.     Right lower leg: No edema.     Left lower leg: No edema.  Lymphadenopathy:     Cervical: No cervical adenopathy.  Skin:    General: Skin is warm and dry.     Findings: No rash.  Neurological:     General: No focal deficit present.     Mental Status: She is alert and oriented to person, place, and time. Mental status is at baseline.     Cranial Nerves: No cranial  nerve deficit.     Motor: No weakness.     Deep Tendon Reflexes: Reflexes are normal and symmetric.  Psychiatric:        Mood and Affect: Mood normal.        Behavior: Behavior normal.        Thought Content: Thought content normal.        Judgment: Judgment normal.     Comments: Affect and behavior and mood are normal for this patient    BP 137/60 (BP Location: Left Arm)   Pulse (!) 58   Temp (!) 97.4 F (36.3 C) (Oral)   Ht '4\' 10"'  (1.473 m)   Wt 142 lb (64.4 kg)   BMI 29.68 kg/m         Assessment & Plan:  1. Pure hypercholesterolemia -Patient has had elevated triglycerides for years.  We will try think we can switch her from over-the-counter omega-3 fatty acids to Vascepa by taking 2 g twice daily  2. Essential hypertension -Blood pressure is good she will continue with current treatment  3. Vitamin D deficiency -Vitamin D level is good she will continue with vitamin D replacement  4. Gastroesophageal reflux disease, esophagitis presence not specified -Start Pepcid AC 1 twice daily before breakfast and supper  Patient Instructions                       Medicare Annual Wellness Visit  [facility] and the medical providers at [facility] strive to bring you the best medical care.  In doing so we not only want to address your current medical conditions and concerns but also to detect new conditions early and prevent illness, disease and health-related problems.    Medicare offers a yearly Wellness Visit  which allows our clinical staff to assess your need for preventative services including immunizations, lifestyle education, counseling to decrease risk of preventable diseases and screening for fall risk and other medical concerns.    This visit is provided free of charge (no copay) for all Medicare recipients. The clinical pharmacists at [facility] have begun to conduct these Wellness Visits which will also include a thorough review of all your medications.    As you primary medical provider recommend that you make an appointment for your Annual Wellness Visit if you have not done so already this year.  You may set up this appointment before you leave today or you may call back ([phone]) and schedule an appointment.  Please make sure when you call that you mention that you are scheduling your Annual Wellness Visit with the clinical pharmacist so that the appointment may be made for the proper length of time.     Continue current medications. Continue good therapeutic lifestyle changes which include good diet and exercise. Fall precautions discussed with patient. If an FOBT was given today- please return it to our front desk. If you are over 4 years old - you may need Prevnar 37 or the adult Pneumonia vaccine.  **Flu shots are available--- please call and schedule a FLU-CLINIC appointment**  After your visit with [place] today you will receive a survey in the mail or online from [employer] regarding your care with [place]. Please take a moment to fill this out. Your feedback is very important to [place] as you can help [place] better understand your patient needs as well as improve your experience and satisfaction. WE CARE ABOUT YOU!!!   The patient should continue with  her current treatment regimen and try to do more with her diet and exercise regimen especially since her husband is feeling better.  This will help bring the triglycerides down. This winter she should drink plenty of fluids  and stay well-hydrated and practice good hand and respiratory hygiene. We will check with your insurance and see if we can switch you from the omega-3 fatty acids over-the-counter to Vascepa 2 g twice daily instead as this has proven efficacy to reduce events heart attack and stroke. The patient should continue to try to get exercise and and continue to watch her diet. If she starts the prescription omega-3 fatty acid she should stop the over-the-counter omega-3 fatty acid. For the indigestion she should take Pepcid AC over-the-counter 1 twice daily before breakfast and supper   Arrie Senate MD

## 2018-09-05 NOTE — Patient Instructions (Addendum)
Medicare Annual Wellness Visit  [facility] and the medical providers at [facility] strive to bring you the best medical care.  In doing so we not only want to address your current medical conditions and concerns but also to detect new conditions early and prevent illness, disease and health-related problems.    Medicare offers a yearly Wellness Visit which allows our clinical staff to assess your need for preventative services including immunizations, lifestyle education, counseling to decrease risk of preventable diseases and screening for fall risk and other medical concerns.    This visit is provided free of charge (no copay) for all Medicare recipients. The clinical pharmacists at [facility] have begun to conduct these Wellness Visits which will also include a thorough review of all your medications.    As you primary medical provider recommend that you make an appointment for your Annual Wellness Visit if you have not done so already this year.  You may set up this appointment before you leave today or you may call back ([phone]) and schedule an appointment.  Please make sure when you call that you mention that you are scheduling your Annual Wellness Visit with the clinical pharmacist so that the appointment may be made for the proper length of time.     Continue current medications. Continue good therapeutic lifestyle changes which include good diet and exercise. Fall precautions discussed with patient. If an FOBT was given today- please return it to our front desk. If you are over 38 years old - you may need Prevnar 4 or the adult Pneumonia vaccine.  **Flu shots are available--- please call and schedule a FLU-CLINIC appointment**  After your visit with [place] today you will receive a survey in the mail or online from [employer] regarding your care with [place]. Please take a moment to fill this out. Your feedback is very  important to [place] as you can help [place] better understand your patient needs as well as improve your experience and satisfaction. WE CARE ABOUT YOU!!!   The patient should continue with her current treatment regimen and try to do more with her diet and exercise regimen especially since her husband is feeling better.  This will help bring the triglycerides down. This winter she should drink plenty of fluids and stay well-hydrated and practice good hand and respiratory hygiene. We will check with your insurance and see if we can switch you from the omega-3 fatty acids over-the-counter to Vascepa 2 g twice daily instead as this has proven efficacy to reduce events heart attack and stroke. The patient should continue to try to get exercise and and continue to watch her diet. If she starts the prescription omega-3 fatty acid she should stop the over-the-counter omega-3 fatty acid. For the indigestion she should take Pepcid AC over-the-counter 1 twice daily before breakfast and supper

## 2018-09-18 DIAGNOSIS — H6123 Impacted cerumen, bilateral: Secondary | ICD-10-CM | POA: Diagnosis not present

## 2018-09-18 DIAGNOSIS — H6983 Other specified disorders of Eustachian tube, bilateral: Secondary | ICD-10-CM | POA: Diagnosis not present

## 2018-09-26 ENCOUNTER — Other Ambulatory Visit: Payer: Medicare Other

## 2018-09-26 DIAGNOSIS — Z1212 Encounter for screening for malignant neoplasm of rectum: Secondary | ICD-10-CM

## 2018-09-27 LAB — FECAL OCCULT BLOOD, IMMUNOCHEMICAL: Fecal Occult Bld: POSITIVE — AB

## 2018-10-08 DIAGNOSIS — Z1231 Encounter for screening mammogram for malignant neoplasm of breast: Secondary | ICD-10-CM | POA: Diagnosis not present

## 2018-10-08 LAB — HM MAMMOGRAPHY

## 2018-10-14 ENCOUNTER — Other Ambulatory Visit: Payer: Self-pay | Admitting: Family Medicine

## 2018-10-17 ENCOUNTER — Other Ambulatory Visit: Payer: Self-pay | Admitting: Family Medicine

## 2018-10-21 ENCOUNTER — Encounter: Payer: Self-pay | Admitting: Cardiology

## 2018-10-21 NOTE — Progress Notes (Signed)
Cardiology Office Note   Date:  10/23/2018   ID:  Melissa Carpenter, DOB September 29, 1933, MRN [id number]  PCP:  Chipper Herb, MD  Cardiologist:   Minus Breeding, MD   Chief Complaint  Patient presents with  . Palpitations      History of Present Illness: Melissa Carpenter is a 85 y.o. female who presents for follow up of palpitations and HTN.   I saw her last year.  Since then she has done well. The patient denies any new symptoms such as chest discomfort, neck or arm discomfort. There has been no new shortness of breath, PND or orthopnea. There have been no reported palpitations, presyncope or syncope.  She does her household chores.  This could include vacuuming.  She might get a little short of breath but she does not think it is extraordinary for which she is doing.  She is not describing resting shortness of breath.  She was at one point describing some heavy beating in her head with her heartbeat but this is not particularly bothering her.  Past Medical History:  Diagnosis Date  . Allergy   . Aortic insufficiency    Mild, echo, December, 2009  . Arthritis   . Carotid artery disease ([place])   . Cataract   . Cellulitis   . Chest pain    Catheterization, 2003, no significant CAD  . Diastolic dysfunction    Mild diastolic dysfunction, echo, December, 2012  . DJD (degenerative joint disease)   . Dyslipidemia   . Edema    November, 2012  . GERD (gastroesophageal reflux disease)   . Hyperlipidemia   . Hypertension   . Mitral valve prolapse    Echo, 2009, very mild intermittent prolapse of the posterior leaflet, no MR  . Osteoporosis   . Prediabetes   . Tachycardia    Nighttime tachycardia, February, 2014  . Thyroid nodule     Past Surgical History:  Procedure Laterality Date  . ABDOMINAL HYSTERECTOMY  1964  . APPENDECTOMY  1946  . BIOPSY THYROID Left   . CARDIAC CATHETERIZATION  sept 2003   no significant cad  . CATARACT EXTRACTION, BILATERAL    . COLONOSCOPY    . NASAL  SINUS SURGERY    . TONSILLECTOMY       Current Outpatient Medications  Medication Sig Dispense Refill  . amLODipine-atorvastatin (CADUET) 10-40 MG tablet TAKE 1 TABLET BY MOUTH EVERY DAY 90 tablet 1  . aspirin 81 MG tablet Take 81 mg by mouth daily.    . Calcium Carbonate-Vitamin D (CALTRATE 600+D) 600-400 MG-UNIT per tablet Take 1 tablet by mouth daily.    . cholecalciferol (VITAMIN D) 400 UNITS TABS Take 1,000 Units by mouth.      . fish oil-omega-3 fatty acids 1000 MG capsule Take 1 g by mouth daily.      . metoprolol succinate (TOPROL-XL) 100 MG 24 hr tablet TAKE 1 TABLET (100 MG TOTAL) BY MOUTH DAILY. TAKE WITH OR IMMEDIATELY FOLLOWING A MEAL. 90 tablet 1  . metoprolol tartrate (LOPRESSOR) 25 MG tablet Take 1 tablet (25 mg total) by mouth as needed. 30 tablet 3  . Multiple Vitamin (MULTIVITAMIN) tablet Take 1 tablet by mouth daily.      Marland Kitchen telmisartan (MICARDIS) 40 MG tablet TAKE 1 TABLET BY MOUTH EVERY DAY 90 tablet 0  . triamcinolone cream (KENALOG) 0.1 % Apply 1 application topically 2 (two) times daily. 80 g 3  . ACCU-CHEK SOFTCLIX LANCETS lancets Use to check blood sugars  daily 100 each 3  . Blood Glucose Monitoring Suppl (ACCU-CHEK AVIVA PLUS) w/Device KIT 1 Units by Does not apply route daily. 1 kit 0  . fluticasone (FLONASE) 50 MCG/ACT nasal spray SPRAY 2 SPRAYS INTO EACH NOSTRIL EVERY DAY (Patient not taking: Reported on 10/23/2018) 48 g 0  . glucose blood (ACCU-CHEK AVIVA PLUS) test strip Use to check blood sugars daily 100 each 3   No current facility-administered medications for this visit.     Allergies:   Codeine; Forteo [teriparatide (recombinant)]; Raloxifene; Morphine; Penicillins; Sulfites; and Sulfonamide derivatives    ROS:  Please see the history of present illness.   Otherwise, review of systems are positive for none   All other systems are reviewed and negative.    PHYSICAL EXAM: VS:  BP (!) 140/58   Pulse 63   Ht 5' (1.524 m)   Wt 143 lb (64.9 kg)   BMI  27.93 kg/m  , BMI Body mass index is 27.93 kg/m.  GENERAL:  Well appearing NECK:  No jugular venous distention, waveform within normal limits, carotid upstroke brisk and symmetric, no bruits, no thyromegaly LUNGS:  Clear to auscultation bilaterally CHEST:  Unremarkable HEART:  PMI not displaced or sustained,S1 and S2 within normal limits, no S3, no S4, no clicks, no rubs, 2 out of 6 apical mid peaking systolic murmur heard at the apex radiating at the aortic outflow tract, no diastolic murmurs ABD:  Flat, positive bowel sounds normal in frequency in pitch, no bruits, no rebound, no guarding, no midline pulsatile mass, no hepatomegaly, no splenomegaly EXT:  2 plus pulses throughout, no edema, no cyanosis no clubbing    EKG:  EKG is ordered today Sinus rhythm, rate 73, axis within normal limits, intervals within normal limits, no acute ST-T wave changes.   Recent Labs: 04/17/2018: TSH 2.340 08/30/2018: ALT 31; BUN 9; Creatinine, Ser 0.53; Hemoglobin 12.5; Platelets 210; Potassium 3.9; Sodium 143    Lipid Panel    Component Value Date/Time   CHOL 132 08/30/2018 0814   CHOL 165 01/13/2013 0833   TRIG 185 (H) 08/30/2018 0814   TRIG 198 (H) 03/01/2017 1706   TRIG 240 (H) 01/13/2013 0833   HDL 41 08/30/2018 0814   HDL 38 (L) 03/01/2017 1706   HDL 39 (L) 01/13/2013 0833   CHOLHDL 3.2 08/30/2018 0814   LDLCALC 54 08/30/2018 0814   LDLCALC 83 03/18/2014 0819   LDLCALC 78 01/13/2013 0833      Wt Readings from Last 3 Encounters:  10/23/18 143 lb (64.9 kg)  09/05/18 142 lb (64.4 kg)  06/11/18 144 lb (65.3 kg)      Other studies Reviewed: Additional studies/ records that were reviewed today include:  Labs Review of the above records demonstrates:  See below   ASSESSMENT AND PLAN:  HTN:   Her blood pressure runs slightly high in the morning and she is going to keep a 2-week blood pressure check checking it 3 times a day and I might need to adjust medications based on these  readings.  However, during the day she says her systolics in the 287O.   PALPITATIONS:   She is not particularly bothered by these.  No change in therapy is indicated.  CAROTID STENOSIS:  She had mild plaque in 2018.  No further imaging at this time.  MURMUR:   This was mild/mod AS in 2018.  I do not think that this has changed by exam or history and no further imaging is planned.  I  will follow this clinically.     Current medicines are reviewed at length with the patient today.  The patient does not have concerns regarding medicines.  The following changes have been made:   None  Labs/ tests ordered today include:   Nonne  Orders Placed This Encounter  Procedures  . EKG 12-Lead     Disposition:   FU with me in 12 months.    Signed, Minus Breeding, MD  10/23/2018 9:51 AM    [facility]

## 2018-10-23 ENCOUNTER — Other Ambulatory Visit: Payer: Self-pay

## 2018-10-23 ENCOUNTER — Ambulatory Visit (INDEPENDENT_AMBULATORY_CARE_PROVIDER_SITE_OTHER): Payer: Medicare Other | Admitting: Cardiology

## 2018-10-23 ENCOUNTER — Encounter: Payer: Self-pay | Admitting: Cardiology

## 2018-10-23 VITALS — BP 140/58 | HR 63 | Ht 60.0 in | Wt 143.0 lb

## 2018-10-23 DIAGNOSIS — R002 Palpitations: Secondary | ICD-10-CM

## 2018-10-23 DIAGNOSIS — I350 Nonrheumatic aortic (valve) stenosis: Secondary | ICD-10-CM | POA: Diagnosis not present

## 2018-10-23 DIAGNOSIS — I10 Essential (primary) hypertension: Secondary | ICD-10-CM | POA: Diagnosis not present

## 2018-10-23 NOTE — Patient Instructions (Signed)
Medication Instructions:  The current medical regimen is effective;  continue present plan and medications.  If you need a refill on your cardiac medications before your next appointment, please call your pharmacy.   Follow-Up: Follow up in 1 year with [name] in [place].  Please call us in January 2021 for a March appointment.    Thank you for choosing [facility]!!

## 2018-10-24 ENCOUNTER — Other Ambulatory Visit: Payer: Medicare Other

## 2018-10-24 ENCOUNTER — Other Ambulatory Visit: Payer: Self-pay

## 2018-10-24 DIAGNOSIS — R195 Other fecal abnormalities: Secondary | ICD-10-CM | POA: Diagnosis not present

## 2018-10-25 LAB — CBC WITH DIFFERENTIAL/PLATELET
Basophils Absolute: 0 10*3/uL (ref 0.0–0.2)
Basos: 1 %
EOS (ABSOLUTE): 0.1 10*3/uL (ref 0.0–0.4)
Eos: 2 %
Hematocrit: 35.4 % (ref 34.0–46.6)
Hemoglobin: 12.3 g/dL (ref 11.1–15.9)
IMMATURE GRANULOCYTES: 0 %
Immature Grans (Abs): 0 10*3/uL (ref 0.0–0.1)
Lymphocytes Absolute: 2 10*3/uL (ref 0.7–3.1)
Lymphs: 31 %
MCH: 30.4 pg (ref 26.6–33.0)
MCHC: 34.7 g/dL (ref 31.5–35.7)
MCV: 88 fL (ref 79–97)
Monocytes Absolute: 0.8 10*3/uL (ref 0.1–0.9)
Monocytes: 12 %
NEUTROS PCT: 54 %
Neutrophils Absolute: 3.6 10*3/uL (ref 1.4–7.0)
Platelets: 207 10*3/uL (ref 150–450)
RBC: 4.04 x10E6/uL (ref 3.77–5.28)
RDW: 12.9 % (ref 11.7–15.4)
WBC: 6.6 10*3/uL (ref 3.4–10.8)

## 2018-10-25 LAB — FECAL OCCULT BLOOD, IMMUNOCHEMICAL: Fecal Occult Bld: NEGATIVE

## 2018-11-23 ENCOUNTER — Other Ambulatory Visit: Payer: Self-pay | Admitting: Family Medicine

## 2019-01-10 ENCOUNTER — Ambulatory Visit (INDEPENDENT_AMBULATORY_CARE_PROVIDER_SITE_OTHER): Payer: Medicare Other | Admitting: Family Medicine

## 2019-01-10 ENCOUNTER — Encounter: Payer: Self-pay | Admitting: Family Medicine

## 2019-01-10 ENCOUNTER — Other Ambulatory Visit: Payer: Self-pay

## 2019-01-10 DIAGNOSIS — I350 Nonrheumatic aortic (valve) stenosis: Secondary | ICD-10-CM

## 2019-01-10 DIAGNOSIS — R7303 Prediabetes: Secondary | ICD-10-CM | POA: Diagnosis not present

## 2019-01-10 DIAGNOSIS — E782 Mixed hyperlipidemia: Secondary | ICD-10-CM | POA: Diagnosis not present

## 2019-01-10 DIAGNOSIS — I10 Essential (primary) hypertension: Secondary | ICD-10-CM | POA: Diagnosis not present

## 2019-01-10 DIAGNOSIS — Z87898 Personal history of other specified conditions: Secondary | ICD-10-CM

## 2019-01-10 DIAGNOSIS — K219 Gastro-esophageal reflux disease without esophagitis: Secondary | ICD-10-CM

## 2019-01-10 DIAGNOSIS — I351 Nonrheumatic aortic (valve) insufficiency: Secondary | ICD-10-CM | POA: Diagnosis not present

## 2019-01-10 DIAGNOSIS — I872 Venous insufficiency (chronic) (peripheral): Secondary | ICD-10-CM | POA: Diagnosis not present

## 2019-01-10 DIAGNOSIS — I6523 Occlusion and stenosis of bilateral carotid arteries: Secondary | ICD-10-CM

## 2019-01-10 DIAGNOSIS — E559 Vitamin D deficiency, unspecified: Secondary | ICD-10-CM

## 2019-01-10 DIAGNOSIS — H9313 Tinnitus, bilateral: Secondary | ICD-10-CM

## 2019-01-10 DIAGNOSIS — M810 Age-related osteoporosis without current pathological fracture: Secondary | ICD-10-CM | POA: Diagnosis not present

## 2019-01-10 DIAGNOSIS — M5440 Lumbago with sciatica, unspecified side: Secondary | ICD-10-CM | POA: Diagnosis not present

## 2019-01-10 DIAGNOSIS — Z1211 Encounter for screening for malignant neoplasm of colon: Secondary | ICD-10-CM

## 2019-01-10 NOTE — Progress Notes (Signed)
Virtual Visit Via telephone Note I connected with@ on 01/10/19 by telephone and verified that I am speaking with the correct person or authorized healthcare agent using two identifiers. Melissa Carpenter is currently located at home and there are no unauthorized people in close proximity. I completed this visit while in a private location in my home .  This visit type was conducted due to national recommendations for restrictions regarding the COVID-19 Pandemic (e.g. social distancing).  This format is felt to be most appropriate for this patient at this time.  All issues noted in this document were discussed and addressed.  No physical exam was performed.    I discussed the limitations, risks, security and privacy concerns of performing an evaluation and management service by telephone and the availability of in person appointments. I also discussed with the patient that there may be a patient responsible charge related to this service. The patient expressed understanding and agreed to proceed.   Date:  01/10/2019    ID:  Melissa Carpenter      1934/03/05        [id number]   Patient Care Team Patient Care Team: Chipper Herb, MD as PCP - General (Family Medicine) Paralee Cancel, MD as Consulting Physician (Orthopedic Surgery) Minus Breeding, MD as Consulting Physician (Cardiology) Angelia Mould, MD as Consulting Physician (Vascular Surgery)  Reason for Visit: Primary Care Follow-up     History of Present Illness & Review of Systems:     Melissa Carpenter is a 85 y.o. year old female primary care patient that presents today for a telehealth visit.  Patient is pleasant and alert and relayed all of her medical history to me well during the visit today.  She denies any chest pain pressure tightness or shortness of breath.  We discussed her last visit with a cardiologist and she has plans to follow-up with him because of aortic insufficiency.  She also has no shortness of breath and is having no  trouble with nausea vomiting diarrhea blood in the stool or black tarry bowel movements.  She had a colonoscopy recently and was told that that was good but she has had some positive stools for blood loss.  We will make sure that we give her another FOBT just to follow-up on the black positive stool and also check a CBC with her blood work.  No change in bowel habits.  She is passing her water well and she will continue to try to drink plenty of fluids and stay well-hydrated.  She sees Dr. Carolynn Sayers about every 2 years now.  She will call the office to set up to get routine lab work for herself and her husband.  He still has some tinnitus issues and she will use her Flonase more regularly.  Review of systems as stated, otherwise negative.  The patient does not have symptoms concerning for COVID-19 infection (fever, chills, cough, or new shortness of breath).      Current Medications (Verified) Allergies as of 01/10/2019      Reactions   Codeine Hives   Forteo [parathyroid Hormone (recomb)] Other (See Comments)   Weakness and calcium increase   Raloxifene Other (See Comments)   Eye problems Other reaction(s): Other (See Comments) Eye problems Eye problems   Morphine Rash   Penicillins Rash   Sulfites Rash   Sulfonamide Derivatives Rash      Medication List       Accurate as of Jan 10, 2019 10:11 AM.  If you have any questions, ask your nurse or doctor.        Accu-Chek Aviva Plus w/Device Kit 1 Units by Does not apply route daily.   Accu-Chek Softclix Lancets lancets Use to check blood sugars daily   amLODipine-atorvastatin 10-40 MG tablet Commonly known as:  CADUET TAKE 1 TABLET BY MOUTH EVERY DAY   aspirin 81 MG tablet Take 81 mg by mouth daily.   Calcium Carbonate-Vitamin D 600-400 MG-UNIT tablet Commonly known as:  Caltrate 600+D Take 1 tablet by mouth daily.   cholecalciferol 10 MCG (400 UNIT) Tabs tablet Commonly known as:  VITAMIN D3 Take 1,000 Units by mouth.    fish oil-omega-3 fatty acids 1000 MG capsule Take 1 g by mouth daily.   fluticasone 50 MCG/ACT nasal spray Commonly known as:  FLONASE SPRAY 2 SPRAYS INTO EACH NOSTRIL EVERY DAY   glucose blood test strip Commonly known as:  Accu-Chek Aviva Plus Use to check blood sugars daily   metoprolol succinate 100 MG 24 hr tablet Commonly known as:  TOPROL-XL TAKE 1 TABLET (100 MG TOTAL) BY MOUTH DAILY. TAKE WITH OR IMMEDIATELY FOLLOWING A MEAL.   metoprolol tartrate 25 MG tablet Commonly known as:  LOPRESSOR Take 1 tablet (25 mg total) by mouth as needed.   multivitamin tablet Take 1 tablet by mouth daily.   telmisartan 40 MG tablet Commonly known as:  MICARDIS TAKE 1 TABLET BY MOUTH EVERY DAY   triamcinolone cream 0.1 % Commonly known as:  KENALOG Apply 1 application topically 2 (two) times daily.           Allergies (Verified)    Codeine; Forteo [parathyroid hormone (recomb)]; Raloxifene; Morphine; Penicillins; Sulfites; and Sulfonamide derivatives  Past Medical History Past Medical History:  Diagnosis Date  . Allergy   . Aortic insufficiency    Mild, echo, December, 2009  . Arthritis   . Carotid artery disease ([place])   . Cataract   . Cellulitis   . Chest pain    Catheterization, 2003, no significant CAD  . Diastolic dysfunction    Mild diastolic dysfunction, echo, December, 2012  . DJD (degenerative joint disease)   . Dyslipidemia   . Edema    November, 2012  . GERD (gastroesophageal reflux disease)   . Hyperlipidemia   . Hypertension   . Mitral valve prolapse    Echo, 2009, very mild intermittent prolapse of the posterior leaflet, no MR  . Osteoporosis   . Prediabetes   . Tachycardia    Nighttime tachycardia, February, 2014  . Thyroid nodule      Past Surgical History:  Procedure Laterality Date  . ABDOMINAL HYSTERECTOMY  1964  . APPENDECTOMY  1946  . BIOPSY THYROID Left   . CARDIAC CATHETERIZATION  sept 2003   no significant cad  . CATARACT  EXTRACTION, BILATERAL    . COLONOSCOPY    . NASAL SINUS SURGERY    . TONSILLECTOMY      Social History   Socioeconomic History  . Marital status: Married    Spouse name: Not on file  . Number of children: 4  . Years of education: Not on file  . Highest education level: Not on file  Occupational History  . Occupation: retired    Fish farm manager: [employer]  Social Needs  . Financial resource strain: Not on file  . Food insecurity:    Worry: Not on file    Inability: Not on file  . Transportation needs:    Medical: Not  on file    Non-medical: Not on file  Tobacco Use  . Smoking status: Never Smoker  . Smokeless tobacco: Never Used  Substance and Sexual Activity  . Alcohol use: No    Alcohol/week: 0.0 standard drinks  . Drug use: No  . Sexual activity: Never  Lifestyle  . Physical activity:    Days per week: Not on file    Minutes per session: Not on file  . Stress: Not on file  Relationships  . Social connections:    Talks on phone: Not on file    Gets together: Not on file    Attends religious service: Not on file    Active member of club or organization: Not on file    Attends meetings of clubs or organizations: Not on file    Relationship status: Not on file  Other Topics Concern  . Not on file  Social History Narrative  . Not on file     Family History  Problem Relation Age of Onset  . Heart attack Mother   . Hypertension Mother   . Throat cancer Mother        THROAT / VOCAL CORD  . Heart disease Mother   . Hypertension Father   . Heart disease Father   . Kidney disease Father        failure  . Bone cancer Sister   . Stroke Sister   . Bone cancer Grandchild        in rib cage  . Hypertension Sister   . Metabolic syndrome Sister        pre diabetes  . GER disease Sister   . Heart disease Sister        CAD  . Heart attack Brother   . Cirrhosis Brother   . Breast cancer Sister   . Bone cancer Sister   . Hyperlipidemia Sister   . Heart attack  Sister   . Stroke Son   . Colitis Neg Hx   . Esophageal cancer Neg Hx   . Stomach cancer Neg Hx   . Rectal cancer Neg Hx       Labs/Other Tests and Data Reviewed:    Wt Readings from Last 3 Encounters:  10/23/18 143 lb (64.9 kg)  09/05/18 142 lb (64.4 kg)  06/11/18 144 lb (65.3 kg)   Temp Readings from Last 3 Encounters:  09/05/18 (!) 97.4 F (36.3 C) (Oral)  06/11/18 (!) 97 F (36.1 C) (Oral)  04/22/18 97.6 F (36.4 C) (Oral)   BP Readings from Last 3 Encounters:  10/23/18 (!) 140/58  09/05/18 137/60  06/11/18 127/62   Pulse Readings from Last 3 Encounters:  10/23/18 63  09/05/18 (!) 58  06/11/18 64     Lab Results  Component Value Date   HGBA1C 6.3 06/26/2018   HGBA1C 6.2 07/13/2017   HGBA1C 6.4 (H) 01/18/2016   Lab Results  Component Value Date   LDLCALC 54 08/30/2018   CREATININE 0.53 (L) 08/30/2018       Chemistry      Component Value Date/Time   NA 143 08/30/2018 0814   K 3.9 08/30/2018 0814   CL 104 08/30/2018 0814   CO2 22 08/30/2018 0814   BUN 9 08/30/2018 0814   CREATININE 0.53 (L) 08/30/2018 0814   CREATININE 0.61 01/13/2013 0833      Component Value Date/Time   CALCIUM 9.6 08/30/2018 0814   ALKPHOS 103 08/30/2018 0814   AST 30 08/30/2018 0814   ALT 31  08/30/2018 0814   BILITOT 0.3 08/30/2018 0814         OBSERVATIONS/ OBJECTIVE:     The patient is pleasant and alert and speaks well and does not appear to be short of breath.  She has an understanding that she has aortic insufficiency hypertension and hyperlipidemia.  She says her blood sugars at home have been running from less than 100 up to as high as 150 but usually closer to the 100 mark.  Her blood pressures at home run between 051 and 833 systolic over 58-25 diastolic.  Her weight is 138 pounds.  Physical exam deferred due to nature of telephonic visit.  ASSESSMENT & PLAN    Time:   Today, I have spent 30 minutes with the patient via telephone discussing the above  including Covid precautions.     Visit Diagnoses: 1. Mixed hyperlipidemia -Continue with aggressive therapeutic lifestyle changes and Caduet pending results of lab work  2. Vitamin D deficiency -Continue with vitamin D replacement pending results of lab work  3. History of impaired glucose tolerance -Stay active physically watch diet closely keep weight down and continue with current treatment and therapeutic lifestyle changes  4. Essential hypertension -Continue with current treatment follow-up with cardiology as planned  5. Pre-diabetes -Monitor blood sugars closely watch diet closely and follow-up aggressive therapeutic lifestyle changes  6. Gastroesophageal reflux disease, esophagitis presence not specified -No complaints today with reflux  7. Bilateral low back pain with sciatica, sciatica laterality unspecified, unspecified chronicity -Take Tylenol for back pain  8. Chronic venous insufficiency -Wear support hose when possible  9. Mild aortic stenosis -Follow-up with cardiology as planned  10. Bilateral carotid artery stenosis -Follow-up with cardiology and get Dopplers periodically of carotid arteries  11. Aortic valve insufficiency, etiology of cardiac valve disease unspecified -Follow-up with cardiology as planned  12. Age-related osteoporosis without current pathological fracture -Continue with calcium and vitamin D and get DEXA scans as recommended by PCP  13.  Tinnitus -Use Flonase and if necessary follow-up again with ENT  Patient Instructions  Continue to walk and exercise regularly Continue to follow-up with cardiology as recommended Continue to practice good hand and respiratory hygiene Continue to follow-up with ophthalmology Continue to follow diet closely make every effort to lose weight through diet and exercise Drink plenty of fluids and use Flonase 1 spray each nostril once daily on a more regular basis and use nasal saline if needed. Follow-up  with Dr. Katy Fitch as recommended We will call with lab work results as soon as those results become available Follow-up with ENT if tinnitus does persist     The above assessment and management plan was discussed with the patient. The patient verbalized understanding of and has agreed to the management plan. Patient is aware to call the clinic if symptoms persist or worsen. Patient is aware when to return to the clinic for a follow-up visit. Patient educated on when it is appropriate to go to the emergency department.    Chipper Herb, MD [place], [place], [place] [address] Ph [phone]   Arrie Senate MD

## 2019-01-10 NOTE — Patient Instructions (Addendum)
Continue to walk and exercise regularly Continue to follow-up with cardiology as recommended Continue to practice good hand and respiratory hygiene Continue to follow-up with ophthalmology Continue to follow diet closely make every effort to lose weight through diet and exercise Drink plenty of fluids and use Flonase 1 spray each nostril once daily on a more regular basis and use nasal saline if needed. Follow-up with Dr. Katy Fitch as recommended We will call with lab work results as soon as those results become available Follow-up with ENT if tinnitus does persist

## 2019-01-10 NOTE — Addendum Note (Signed)
Addended by: Zannie Cove on: 01/10/2019 01:25 PM   Modules accepted: Orders

## 2019-01-16 ENCOUNTER — Other Ambulatory Visit: Payer: Self-pay | Admitting: Family Medicine

## 2019-01-17 NOTE — Telephone Encounter (Signed)
rx refill Last OV 01/10/19

## 2019-01-20 ENCOUNTER — Telehealth: Payer: Self-pay | Admitting: Family Medicine

## 2019-01-20 NOTE — Telephone Encounter (Signed)
Patient aware lab orders are placed

## 2019-01-20 NOTE — Telephone Encounter (Signed)
PT is wanting to speak to jamie about having some labs done

## 2019-01-21 ENCOUNTER — Other Ambulatory Visit: Payer: Medicare Other

## 2019-01-21 ENCOUNTER — Other Ambulatory Visit: Payer: Self-pay

## 2019-01-21 DIAGNOSIS — Z87898 Personal history of other specified conditions: Secondary | ICD-10-CM | POA: Diagnosis not present

## 2019-01-21 DIAGNOSIS — M5440 Lumbago with sciatica, unspecified side: Secondary | ICD-10-CM | POA: Diagnosis not present

## 2019-01-21 DIAGNOSIS — K219 Gastro-esophageal reflux disease without esophagitis: Secondary | ICD-10-CM | POA: Diagnosis not present

## 2019-01-21 DIAGNOSIS — M810 Age-related osteoporosis without current pathological fracture: Secondary | ICD-10-CM

## 2019-01-21 DIAGNOSIS — I10 Essential (primary) hypertension: Secondary | ICD-10-CM

## 2019-01-21 DIAGNOSIS — E559 Vitamin D deficiency, unspecified: Secondary | ICD-10-CM | POA: Diagnosis not present

## 2019-01-21 DIAGNOSIS — I872 Venous insufficiency (chronic) (peripheral): Secondary | ICD-10-CM | POA: Diagnosis not present

## 2019-01-21 DIAGNOSIS — I351 Nonrheumatic aortic (valve) insufficiency: Secondary | ICD-10-CM

## 2019-01-21 DIAGNOSIS — I6523 Occlusion and stenosis of bilateral carotid arteries: Secondary | ICD-10-CM | POA: Diagnosis not present

## 2019-01-21 DIAGNOSIS — H9313 Tinnitus, bilateral: Secondary | ICD-10-CM

## 2019-01-21 DIAGNOSIS — I350 Nonrheumatic aortic (valve) stenosis: Secondary | ICD-10-CM | POA: Diagnosis not present

## 2019-01-21 DIAGNOSIS — R7303 Prediabetes: Secondary | ICD-10-CM

## 2019-01-21 DIAGNOSIS — E782 Mixed hyperlipidemia: Secondary | ICD-10-CM

## 2019-01-21 DIAGNOSIS — Z1211 Encounter for screening for malignant neoplasm of colon: Secondary | ICD-10-CM

## 2019-01-21 LAB — LIPID PANEL

## 2019-01-22 ENCOUNTER — Encounter: Payer: Self-pay | Admitting: *Deleted

## 2019-01-22 ENCOUNTER — Telehealth: Payer: Self-pay | Admitting: *Deleted

## 2019-01-22 LAB — LIPID PANEL
Chol/HDL Ratio: 3.1 ratio (ref 0.0–4.4)
Cholesterol, Total: 135 mg/dL (ref 100–199)
HDL: 44 mg/dL (ref >39)
LDL Calculated: 61 mg/dL (ref 0–99)
Triglycerides: 149 mg/dL (ref 0–149)
VLDL Cholesterol Cal: 30 mg/dL (ref 5–40)

## 2019-01-22 LAB — CBC WITH DIFFERENTIAL/PLATELET
Basophils Absolute: 0 10*3/uL (ref 0.0–0.2)
Basos: 0 %
EOS (ABSOLUTE): 0.2 10*3/uL (ref 0.0–0.4)
Eos: 3 %
Hematocrit: 40.4 % (ref 34.0–46.6)
Hemoglobin: 13.3 g/dL (ref 11.1–15.9)
Immature Grans (Abs): 0 10*3/uL (ref 0.0–0.1)
Immature Granulocytes: 0 %
Lymphocytes Absolute: 1.5 10*3/uL (ref 0.7–3.1)
Lymphs: 29 %
MCH: 30.2 pg (ref 26.6–33.0)
MCHC: 32.9 g/dL (ref 31.5–35.7)
MCV: 92 fL (ref 79–97)
Monocytes Absolute: 0.6 10*3/uL (ref 0.1–0.9)
Monocytes: 11 %
Neutrophils Absolute: 2.9 10*3/uL (ref 1.4–7.0)
Neutrophils: 57 %
Platelets: 214 10*3/uL (ref 150–450)
RBC: 4.4 x10E6/uL (ref 3.77–5.28)
RDW: 13.2 % (ref 11.7–15.4)
WBC: 5.2 10*3/uL (ref 3.4–10.8)

## 2019-01-22 LAB — VITAMIN D 25 HYDROXY (VIT D DEFICIENCY, FRACTURES): Vit D, 25-Hydroxy: 43.8 ng/mL (ref 30.0–100.0)

## 2019-01-22 LAB — HEPATIC FUNCTION PANEL
ALT: 41 IU/L — ABNORMAL HIGH (ref 0–32)
AST: 40 IU/L (ref 0–40)
Albumin: 4.6 g/dL (ref 3.6–4.6)
Alkaline Phosphatase: 121 IU/L — ABNORMAL HIGH (ref 39–117)
Bilirubin Total: 0.4 mg/dL (ref 0.0–1.2)
Bilirubin, Direct: 0.1 mg/dL (ref 0.00–0.40)
Total Protein: 7.3 g/dL (ref 6.0–8.5)

## 2019-01-22 LAB — BMP8+EGFR
BUN/Creatinine Ratio: 29 — ABNORMAL HIGH (ref 12–28)
BUN: 15 mg/dL (ref 8–27)
CO2: 18 mmol/L — ABNORMAL LOW (ref 20–29)
Calcium: 9.9 mg/dL (ref 8.7–10.3)
Chloride: 103 mmol/L (ref 96–106)
Creatinine, Ser: 0.51 mg/dL — ABNORMAL LOW (ref 0.57–1.00)
GFR calc Af Amer: 104 mL/min/{1.73_m2} (ref >59)
GFR calc non Af Amer: 90 mL/min/{1.73_m2} (ref >59)
Glucose: 108 mg/dL — ABNORMAL HIGH (ref 65–99)
Potassium: 4.6 mmol/L (ref 3.5–5.2)
Sodium: 141 mmol/L (ref 134–144)

## 2019-01-22 NOTE — Telephone Encounter (Signed)
Called pt about bp readings

## 2019-01-22 NOTE — Telephone Encounter (Signed)
Patient says she was told by Dr. Laurance Flatten that she could talk to you when she needed anything.  She would like for you to call her.

## 2019-02-27 ENCOUNTER — Other Ambulatory Visit: Payer: Self-pay

## 2019-02-27 ENCOUNTER — Other Ambulatory Visit: Payer: Medicare Other

## 2019-02-27 DIAGNOSIS — R945 Abnormal results of liver function studies: Secondary | ICD-10-CM | POA: Diagnosis not present

## 2019-02-27 DIAGNOSIS — R7989 Other specified abnormal findings of blood chemistry: Secondary | ICD-10-CM

## 2019-02-28 ENCOUNTER — Other Ambulatory Visit: Payer: Self-pay | Admitting: Family Medicine

## 2019-02-28 LAB — HEPATIC FUNCTION PANEL
ALT: 29 IU/L (ref 0–32)
AST: 29 IU/L (ref 0–40)
Albumin: 4.3 g/dL (ref 3.6–4.6)
Alkaline Phosphatase: 111 IU/L (ref 39–117)
Bilirubin Total: 0.3 mg/dL (ref 0.0–1.2)
Bilirubin, Direct: 0.08 mg/dL (ref 0.00–0.40)
Total Protein: 6.3 g/dL (ref 6.0–8.5)

## 2019-04-03 DIAGNOSIS — L239 Allergic contact dermatitis, unspecified cause: Secondary | ICD-10-CM | POA: Diagnosis not present

## 2019-04-14 ENCOUNTER — Other Ambulatory Visit: Payer: Self-pay | Admitting: Family Medicine

## 2019-05-16 ENCOUNTER — Ambulatory Visit (INDEPENDENT_AMBULATORY_CARE_PROVIDER_SITE_OTHER): Payer: Medicare Other | Admitting: Family Medicine

## 2019-05-16 ENCOUNTER — Encounter: Payer: Self-pay | Admitting: Family Medicine

## 2019-05-16 DIAGNOSIS — K219 Gastro-esophageal reflux disease without esophagitis: Secondary | ICD-10-CM

## 2019-05-16 DIAGNOSIS — K449 Diaphragmatic hernia without obstruction or gangrene: Secondary | ICD-10-CM | POA: Diagnosis not present

## 2019-05-16 DIAGNOSIS — I6523 Occlusion and stenosis of bilateral carotid arteries: Secondary | ICD-10-CM

## 2019-05-16 DIAGNOSIS — E785 Hyperlipidemia, unspecified: Secondary | ICD-10-CM | POA: Diagnosis not present

## 2019-05-16 DIAGNOSIS — R7303 Prediabetes: Secondary | ICD-10-CM | POA: Diagnosis not present

## 2019-05-16 DIAGNOSIS — I10 Essential (primary) hypertension: Secondary | ICD-10-CM | POA: Diagnosis not present

## 2019-05-16 MED ORDER — METOPROLOL SUCCINATE ER 100 MG PO TB24: 100.0000 mg | ORAL_TABLET | Freq: Every day | ORAL | 3 refills | Status: DC

## 2019-05-16 MED ORDER — AMLODIPINE-ATORVASTATIN 10-40 MG PO TABS: 1.0000 | ORAL_TABLET | Freq: Every day | ORAL | 3 refills | Status: DC

## 2019-05-16 MED ORDER — TELMISARTAN 40 MG PO TABS: 40.0000 mg | ORAL_TABLET | Freq: Every day | ORAL | 3 refills | Status: DC

## 2019-05-16 NOTE — Progress Notes (Signed)
Virtual Visit via telephone Note  I connected with Melissa Carpenter on 05/16/19 at 1408 by telephone and verified that I am speaking with the correct person using two identifiers. Melissa Carpenter is currently located at home and no other people are currently with her during visit. The provider, [name] Erikah Thumm, MD is located in their office at time of visit.  Call ended at 1420  I discussed the limitations, risks, security and privacy concerns of performing an evaluation and management service by telephone and the availability of in person appointments. I also discussed with the patient that there may be a patient responsible charge related to this service. The patient expressed understanding and agreed to proceed.  BP 120/50 History and Present Illness: Prediabetes Patient comes in today for recheck of his diabetes. Patient has been currently taking no medications and is diet controlled. Patient is currently on an ACE inhibitor/ARB. Patient has seen an ophthalmologist this year. Patient denies any issues with their feet.   GERD Patient is currently on no medication.  She denies any major symptoms or abdominal pain or belching or burping. She denies any blood in her stool or lightheadedness or dizziness.   Hyperlipidemia Patient is coming in for recheck of his hyperlipidemia. The patient is currently taking atorvastatin. They deny any issues with myalgias or history of liver damage from it. They deny any focal numbness or weakness or chest pain.   Hypertension Patient is currently on metoprolol, telmisartan and amdlodipine, and their blood pressure today is 120/50. Patient denies any lightheadedness or dizziness. Patient denies headaches, blurred vision, chest pains, shortness of breath, or weakness. Denies any side effects from medication and is content with current medication.     Outpatient Encounter Medications as of 05/16/2019  Medication Sig  . ACCU-CHEK SOFTCLIX LANCETS lancets Use to  check blood sugars daily  . amLODipine-atorvastatin (CADUET) 10-40 MG tablet Take 1 tablet by mouth daily.  Marland Kitchen aspirin 81 MG tablet Take 81 mg by mouth daily.  . Blood Glucose Monitoring Suppl (ACCU-CHEK AVIVA PLUS) w/Device KIT 1 Units by Does not apply route daily.  . Calcium Carbonate-Vitamin D (CALTRATE 600+D) 600-400 MG-UNIT per tablet Take 1 tablet by mouth daily.  . cholecalciferol (VITAMIN D) 400 UNITS TABS Take 1,000 Units by mouth.    . fish oil-omega-3 fatty acids 1000 MG capsule Take 1 g by mouth daily.    . fluticasone (FLONASE) 50 MCG/ACT nasal spray SPRAY 2 SPRAYS INTO EACH NOSTRIL EVERY DAY  . glucose blood (ACCU-CHEK AVIVA PLUS) test strip Use to check blood sugars daily  . metoprolol succinate (TOPROL-XL) 100 MG 24 hr tablet Take 1 tablet (100 mg total) by mouth daily. Take with or immediately following a meal.  . metoprolol tartrate (LOPRESSOR) 25 MG tablet Take 1 tablet (25 mg total) by mouth as needed.  . Multiple Vitamin (MULTIVITAMIN) tablet Take 1 tablet by mouth daily.    Marland Kitchen telmisartan (MICARDIS) 40 MG tablet Take 1 tablet (40 mg total) by mouth daily.  Marland Kitchen triamcinolone cream (KENALOG) 0.1 % Apply 1 application topically 2 (two) times daily.  . [DISCONTINUED] amLODipine-atorvastatin (CADUET) 10-40 MG tablet TAKE 1 TABLET BY MOUTH EVERY DAY  . [DISCONTINUED] metoprolol succinate (TOPROL-XL) 100 MG 24 hr tablet TAKE 1 TABLET (100 MG TOTAL) BY MOUTH DAILY. TAKE WITH OR IMMEDIATELY FOLLOWING A MEAL.  . [DISCONTINUED] telmisartan (MICARDIS) 40 MG tablet TAKE 1 TABLET BY MOUTH EVERY DAY   No facility-administered encounter medications on file as of 05/16/2019.  Review of Systems  Constitutional: Negative for chills and fever.  Eyes: Negative for visual disturbance.  Respiratory: Negative for chest tightness and shortness of breath.   Cardiovascular: Negative for chest pain and leg swelling.  Musculoskeletal: Negative for back pain and gait problem.  Skin: Negative for  rash.  Neurological: Negative for dizziness, light-headedness and headaches.  Psychiatric/Behavioral: Negative for agitation and behavioral problems.  All other systems reviewed and are negative.   Observations/Objective: Patient sounds comfortable and in no acute distress  Assessment and Plan: Problem List Items Addressed This Visit      Cardiovascular and Mediastinum   Essential hypertension (Chronic)   Relevant Medications   amLODipine-atorvastatin (CADUET) 10-40 MG tablet   metoprolol succinate (TOPROL-XL) 100 MG 24 hr tablet   telmisartan (MICARDIS) 40 MG tablet     Respiratory   Gastroesophageal reflux disease with hiatal hernia     Other   Dyslipidemia (Chronic)   Relevant Medications   amLODipine-atorvastatin (CADUET) 10-40 MG tablet   Pre-diabetes - Primary   Relevant Orders   BMP8+EGFR   Bayer DCA Hb A1c Waived       Follow Up Instructions:  Follow up in 4 months Prediabetes and htn and no changes in meds   I discussed the assessment and treatment plan with the patient. The patient was provided an opportunity to ask questions and all were answered. The patient agreed with the plan and demonstrated an understanding of the instructions.   The patient was advised to call back or seek an in-person evaluation if the symptoms worsen or if the condition fails to improve as anticipated.  The above assessment and management plan was discussed with the patient. The patient verbalized understanding of and has agreed to the management plan. Patient is aware to call the clinic if symptoms persist or worsen. Patient is aware when to return to the clinic for a follow-up visit. Patient educated on when it is appropriate to go to the emergency department.    I provided 12 minutes of non-face-to-face time during this encounter.    Worthy Rancher, MD

## 2019-05-28 ENCOUNTER — Other Ambulatory Visit: Payer: Self-pay

## 2019-05-29 ENCOUNTER — Ambulatory Visit (INDEPENDENT_AMBULATORY_CARE_PROVIDER_SITE_OTHER): Payer: Medicare Other

## 2019-05-29 DIAGNOSIS — R7303 Prediabetes: Secondary | ICD-10-CM | POA: Diagnosis not present

## 2019-05-29 DIAGNOSIS — Z23 Encounter for immunization: Secondary | ICD-10-CM | POA: Diagnosis not present

## 2019-05-29 LAB — BMP8+EGFR
BUN/Creatinine Ratio: 28 (ref 12–28)
BUN: 16 mg/dL (ref 8–27)
CO2: 23 mmol/L (ref 20–29)
Calcium: 9.8 mg/dL (ref 8.7–10.3)
Chloride: 104 mmol/L (ref 96–106)
Creatinine, Ser: 0.58 mg/dL (ref 0.57–1.00)
GFR calc Af Amer: 99 mL/min/{1.73_m2} (ref >59)
GFR calc non Af Amer: 86 mL/min/{1.73_m2} (ref >59)
Glucose: 123 mg/dL — ABNORMAL HIGH (ref 65–99)
Potassium: 4.1 mmol/L (ref 3.5–5.2)
Sodium: 142 mmol/L (ref 134–144)

## 2019-05-29 LAB — BAYER DCA HB A1C WAIVED: HB A1C (BAYER DCA - WAIVED): 6.2 % (ref <7.0)

## 2019-09-19 ENCOUNTER — Other Ambulatory Visit: Payer: Self-pay

## 2019-09-22 ENCOUNTER — Ambulatory Visit (INDEPENDENT_AMBULATORY_CARE_PROVIDER_SITE_OTHER): Payer: Medicare Other

## 2019-09-22 ENCOUNTER — Encounter: Payer: Self-pay | Admitting: Family Medicine

## 2019-09-22 ENCOUNTER — Ambulatory Visit (INDEPENDENT_AMBULATORY_CARE_PROVIDER_SITE_OTHER): Payer: Medicare Other | Admitting: Family Medicine

## 2019-09-22 VITALS — BP 149/71 | HR 69 | Temp 97.3°F | Ht 61.0 in | Wt 138.8 lb

## 2019-09-22 DIAGNOSIS — I10 Essential (primary) hypertension: Secondary | ICD-10-CM

## 2019-09-22 DIAGNOSIS — E785 Hyperlipidemia, unspecified: Secondary | ICD-10-CM

## 2019-09-22 DIAGNOSIS — R7303 Prediabetes: Secondary | ICD-10-CM | POA: Diagnosis not present

## 2019-09-22 DIAGNOSIS — K449 Diaphragmatic hernia without obstruction or gangrene: Secondary | ICD-10-CM

## 2019-09-22 DIAGNOSIS — K219 Gastro-esophageal reflux disease without esophagitis: Secondary | ICD-10-CM | POA: Diagnosis not present

## 2019-09-22 DIAGNOSIS — M810 Age-related osteoporosis without current pathological fracture: Secondary | ICD-10-CM

## 2019-09-22 LAB — BAYER DCA HB A1C WAIVED: HB A1C (BAYER DCA - WAIVED): 6.3 % (ref <7.0)

## 2019-09-22 MED ORDER — ACCU-CHEK SOFTCLIX LANCETS MISC: 3 refills | Status: DC

## 2019-09-22 MED ORDER — ACCU-CHEK AVIVA PLUS VI STRP: ORAL_STRIP | 3 refills | Status: DC

## 2019-09-22 NOTE — Progress Notes (Addendum)
BP (!) 149/71   Pulse 69   Temp (!) 97.3 F (36.3 C) (Temporal)   Ht 5\' 1"  (1.549 m)   Wt 138 lb 12.8 oz (63 kg)   SpO2 100%   BMI 26.23 kg/m    Subjective:   Patient ID: Melissa Carpenter, female    DOB: [birth year], 86 y.o.   MRN: [id number]  HPI: Melissa Carpenter is a 86 y.o. female presenting on 09/22/2019 for prediabetes   HPI Type 2 diabetes mellitus Patient comes in today for recheck of his diabetes. Patient has been currently taking no medication. Patient is currently on an ACE inhibitor/ARB. Patient has not seen an ophthalmologist this year. Patient denies any issues with their feet.   GERD Patient is currently on no medication currently and denies any symptoms currently.  She denies any major symptoms or abdominal pain or belching or burping. She denies any blood in her stool or lightheadedness or dizziness.   Hypertension Patient is currently on amlodipine and metoprolol and telmisartan, and their blood pressure today is 149/71, she has some home numbers that are running between 120s to 160s, most commonly in the 140s about average.. Patient denies any lightheadedness or dizziness. Patient denies headaches, blurred vision, chest pains, shortness of breath, or weakness. Denies any side effects from medication and is content with current medication.  Hyperlipidemia Patient is coming in for recheck of his hyperlipidemia. The patient is currently taking atorvastatin and fish oil. They deny any issues with myalgias or history of liver damage from it. They deny any focal numbness or weakness or chest pain.   Osteoporosis Patient has been diagnosed with osteoporosis and is due for a bone density scan is been just over 2 years, will go ahead and repeat 1, she denies any pathological fractures or major falls.  Relevant past medical, surgical, family and social history reviewed and updated as indicated. Interim medical history since our last visit reviewed. Allergies and medications reviewed  and updated.  Review of Systems  Constitutional: Negative for chills and fever.  HENT: Negative for congestion, ear discharge and ear pain.   Eyes: Negative for visual disturbance.  Respiratory: Negative for chest tightness and shortness of breath.   Cardiovascular: Negative for chest pain and leg swelling.  Genitourinary: Negative for difficulty urinating and dysuria.  Musculoskeletal: Negative for back pain and gait problem.  Skin: Negative for rash.  Neurological: Negative for light-headedness and headaches.  Psychiatric/Behavioral: Negative for agitation and behavioral problems.  All other systems reviewed and are negative.   Per HPI unless specifically indicated above   Allergies as of 09/22/2019      Reactions   Codeine Hives   Forteo [parathyroid Hormone (recomb)] Other (See Comments)   Weakness and calcium increase   Raloxifene Other (See Comments)   Eye problems Other reaction(s): Other (See Comments) Eye problems Eye problems   Morphine Rash   Penicillins Rash   Sulfites Rash   Sulfonamide Derivatives Rash      Medication List       Accurate as of September 22, 2019  8:28 AM. If you have any questions, ask your nurse or doctor.        STOP taking these medications   metoprolol tartrate 25 MG tablet Commonly known as: LOPRESSOR Stopped by: [name] Jezabel Lecker, MD     TAKE these medications   Accu-Chek Aviva Plus test strip Generic drug: glucose blood Use to check blood sugars daily   Accu-Chek Aviva Plus w/Device Kit 1 Units  by Does not apply route daily.   Accu-Chek Softclix Lancets lancets Use to check blood sugars daily   amLODipine-atorvastatin 10-40 MG tablet Commonly known as: CADUET Take 1 tablet by mouth daily.   aspirin 81 MG tablet Take 81 mg by mouth daily.   Calcium Carbonate-Vitamin D 600-400 MG-UNIT tablet Commonly known as: Caltrate 600+D Take 1 tablet by mouth daily.   cholecalciferol 10 MCG (400 UNIT) Tabs tablet Commonly  known as: VITAMIN D3 Take 1,000 Units by mouth.   fish oil-omega-3 fatty acids 1000 MG capsule Take 1 g by mouth daily.   fluticasone 50 MCG/ACT nasal spray Commonly known as: FLONASE SPRAY 2 SPRAYS INTO EACH NOSTRIL EVERY DAY   metoprolol succinate 100 MG 24 hr tablet Commonly known as: TOPROL-XL Take 1 tablet (100 mg total) by mouth daily. Take with or immediately following a meal.   multivitamin tablet Take 1 tablet by mouth daily.   telmisartan 40 MG tablet Commonly known as: MICARDIS Take 1 tablet (40 mg total) by mouth daily.   triamcinolone cream 0.1 % Commonly known as: KENALOG Apply 1 application topically 2 (two) times daily.        Objective:   BP (!) 149/71   Pulse 69   Temp (!) 97.3 F (36.3 C) (Temporal)   Ht 5\' 1"  (1.549 m)   Wt 138 lb 12.8 oz (63 kg)   SpO2 100%   BMI 26.23 kg/m   Wt Readings from Last 3 Encounters:  09/22/19 138 lb 12.8 oz (63 kg)  10/23/18 143 lb (64.9 kg)  09/05/18 142 lb (64.4 kg)    Physical Exam Vitals and nursing note reviewed.  Constitutional:      General: She is not in acute distress.    Appearance: She is well-developed. She is not diaphoretic.  Eyes:     Conjunctiva/sclera: Conjunctivae normal.  Cardiovascular:     Rate and Rhythm: Normal rate and regular rhythm.     Heart sounds: Murmur present. Crescendo  decrescendo  systolic (At both sides of second intercostal space near sternum) murmur present with a grade of 3/6.  Pulmonary:     Effort: Pulmonary effort is normal. No respiratory distress.     Breath sounds: Normal breath sounds. No wheezing.  Musculoskeletal:        General: No tenderness. Normal range of motion.  Skin:    General: Skin is warm and dry.     Findings: No rash.  Neurological:     Mental Status: She is alert and oriented to person, place, and time.     Coordination: Coordination normal.  Psychiatric:        Behavior: Behavior normal.       Assessment & Plan:   Problem List  Items Addressed This Visit      Cardiovascular and Mediastinum   Essential hypertension (Chronic)   Relevant Orders   CMP14+EGFR     Respiratory   Gastroesophageal reflux disease with hiatal hernia   Relevant Orders   CBC with Differential/Platelet     Musculoskeletal and Integument   Osteoporosis   Relevant Orders   DG [facility] DEXA     Other   Dyslipidemia (Chronic)   Relevant Orders   Lipid panel   Pre-diabetes - Primary   Relevant Medications   glucose blood (ACCU-CHEK AVIVA PLUS) test strip   Accu-Chek Softclix Lancets lancets   Other Relevant Orders   Bayer DCA Hb A1c Waived   CMP14+EGFR      Will get blood  work today, no change in medication, will do bone density scan today. Follow up plan: Return in about 4 months (around 01/20/2020), or if symptoms worsen or fail to improve, for prediabetes and htn.  Counseling provided for all of the vaccine components Orders Placed This Encounter  Procedures  . DG [facility] DEXA  . Bayer DCA Hb A1c Waived  . CBC with Differential/Platelet  . CMP14+EGFR  . Lipid panel    Arville Care, MD [facility] 09/22/2019, 8:28 AM

## 2019-09-23 LAB — CMP14+EGFR
ALT: 40 IU/L — ABNORMAL HIGH (ref 0–32)
AST: 40 IU/L (ref 0–40)
Albumin/Globulin Ratio: 1.7 (ref 1.2–2.2)
Albumin: 4.5 g/dL (ref 3.6–4.6)
Alkaline Phosphatase: 121 IU/L — ABNORMAL HIGH (ref 39–117)
BUN/Creatinine Ratio: 22 (ref 12–28)
BUN: 14 mg/dL (ref 8–27)
Bilirubin Total: 0.4 mg/dL (ref 0.0–1.2)
CO2: 20 mmol/L (ref 20–29)
Calcium: 9.7 mg/dL (ref 8.7–10.3)
Chloride: 106 mmol/L (ref 96–106)
Creatinine, Ser: 0.63 mg/dL (ref 0.57–1.00)
GFR calc Af Amer: 96 mL/min/{1.73_m2} (ref >59)
GFR calc non Af Amer: 83 mL/min/{1.73_m2} (ref >59)
Globulin, Total: 2.6 g/dL (ref 1.5–4.5)
Glucose: 108 mg/dL — ABNORMAL HIGH (ref 65–99)
Potassium: 4 mmol/L (ref 3.5–5.2)
Sodium: 143 mmol/L (ref 134–144)
Total Protein: 7.1 g/dL (ref 6.0–8.5)

## 2019-09-23 LAB — LIPID PANEL
Chol/HDL Ratio: 3.1 ratio (ref 0.0–4.4)
Cholesterol, Total: 131 mg/dL (ref 100–199)
HDL: 42 mg/dL (ref >39)
LDL Chol Calc (NIH): 64 mg/dL (ref 0–99)
Triglycerides: 141 mg/dL (ref 0–149)
VLDL Cholesterol Cal: 25 mg/dL (ref 5–40)

## 2019-09-23 LAB — CBC WITH DIFFERENTIAL/PLATELET
Basophils Absolute: 0 10*3/uL (ref 0.0–0.2)
Basos: 1 %
EOS (ABSOLUTE): 0.1 10*3/uL (ref 0.0–0.4)
Eos: 2 %
Hematocrit: 40 % (ref 34.0–46.6)
Hemoglobin: 13.8 g/dL (ref 11.1–15.9)
Immature Grans (Abs): 0 10*3/uL (ref 0.0–0.1)
Immature Granulocytes: 0 %
Lymphocytes Absolute: 1.6 10*3/uL (ref 0.7–3.1)
Lymphs: 29 %
MCH: 31 pg (ref 26.6–33.0)
MCHC: 34.5 g/dL (ref 31.5–35.7)
MCV: 90 fL (ref 79–97)
Monocytes Absolute: 0.7 10*3/uL (ref 0.1–0.9)
Monocytes: 13 %
Neutrophils Absolute: 3 10*3/uL (ref 1.4–7.0)
Neutrophils: 55 %
Platelets: 201 10*3/uL (ref 150–450)
RBC: 4.45 x10E6/uL (ref 3.77–5.28)
RDW: 13.3 % (ref 11.7–15.4)
WBC: 5.5 10*3/uL (ref 3.4–10.8)

## 2019-10-31 ENCOUNTER — Telehealth: Payer: Self-pay | Admitting: Family Medicine

## 2019-10-31 NOTE — Chronic Care Management (AMB) (Signed)
  Chronic Care Management   Note  10/31/2019 Name: Melissa Carpenter MRN: [id number] DOB: 09-17-1933  Melissa Carpenter is a 86 y.o. year old female who is a primary care patient of [name], [name], MD. I reached out to [name] by phone today in response to a referral sent by [name] health plan.     [name] was given information about Chronic Care Management services today including:  1. CCM service includes personalized support from designated clinical staff supervised by her physician, including individualized plan of care and coordination with other care providers 2. 24/7 contact phone numbers for assistance for urgent and routine care needs. 3. Service will only be billed when office clinical staff spend 20 minutes or more in a month to coordinate care. 4. Only one practitioner may furnish and bill the service in a calendar month. 5. The patient may stop CCM services at any time (effective at the end of the month) by phone call to the office staff. 6. The patient will be responsible for cost sharing (co-pay) of up to 20% of the service fee (after annual deductible is met).  Patient did not agree to enrollment in care management services and does not wish to consider at this time.  Follow up plan: The patient has been provided with contact information for the care management team and has been advised to call with any health related questions or concerns.   Pablo Pena, [place] [address] Direct Dial: [phone] [email or web address]: [email or web address]

## 2019-12-02 DIAGNOSIS — Z7189 Other specified counseling: Secondary | ICD-10-CM | POA: Insufficient documentation

## 2019-12-02 DIAGNOSIS — R011 Cardiac murmur, unspecified: Secondary | ICD-10-CM | POA: Insufficient documentation

## 2019-12-02 NOTE — Progress Notes (Signed)
Cardiology Office Note   Date:  12/03/2019   ID:  Melissa Carpenter, DOB Nov 07, 1933, MRN [id number]  PCP:  [name], [name], MD  Cardiologist:   Minus Breeding, MD   No chief complaint on file.     History of Present Illness: Melissa Carpenter is a 86 y.o. female who presents for follow up of palpitations and HTN.   Since I last saw her she has done okay.  She has had no chest discomfort, neck or arm discomfort.  She does not have palpitations overnight she feels her heart sometimes beating in her head.  Is not fast or irregular.  This happens after she wakes up to go to the bathroom.  She is taking her blood pressure and she is fairly consistently seeing her blood pressures in the night and early morning in the 160s and even as high as 188 systolic with diastolics in the 67R.  Heart rate is in the 60s and 50s.  Her blood pressure comes down during the afternoon.  She denies any chest pressure, neck or arm discomfort.  She does get short of breath climbing the stairs but she thinks this is slowly progressive.  She has had no resting shortness of breath, PND or orthopnea.  She has not noticed any palpitations, presyncope or syncope.      Past Medical History:  Diagnosis Date  . Allergy   . Aortic insufficiency    Mild, echo, December, 2009  . Arthritis   . Carotid artery disease ([place])   . Cataract   . Cellulitis   . Chest pain    Catheterization, 2003, no significant CAD  . Diastolic dysfunction    Mild diastolic dysfunction, echo, December, 2012  . DJD (degenerative joint disease)   . Dyslipidemia   . Edema    November, 2012  . GERD (gastroesophageal reflux disease)   . Hyperlipidemia   . Hypertension   . Mitral valve prolapse    Echo, 2009, very mild intermittent prolapse of the posterior leaflet, no MR  . Osteoporosis   . Prediabetes   . Tachycardia    Nighttime tachycardia, February, 2014  . Thyroid nodule     Past Surgical History:  Procedure Laterality Date  . ABDOMINAL  HYSTERECTOMY  1964  . APPENDECTOMY  1946  . BIOPSY THYROID Left   . CARDIAC CATHETERIZATION  sept 2003   no significant cad  . CATARACT EXTRACTION, BILATERAL    . COLONOSCOPY    . NASAL SINUS SURGERY    . TONSILLECTOMY       Current Outpatient Medications  Medication Sig Dispense Refill  . amLODipine-atorvastatin (CADUET) 10-40 MG tablet Take 1 tablet by mouth daily. 90 tablet 3  . aspirin 81 MG tablet Take 81 mg by mouth daily.    . Calcium Carbonate-Vitamin D (CALTRATE 600+D) 600-400 MG-UNIT per tablet Take 1 tablet by mouth daily.    . cholecalciferol (VITAMIN D) 400 UNITS TABS Take 1,000 Units by mouth.      . fish oil-omega-3 fatty acids 1000 MG capsule Take 1 g by mouth daily.      . fluticasone (FLONASE) 50 MCG/ACT nasal spray SPRAY 2 SPRAYS INTO EACH NOSTRIL EVERY DAY 48 g 0  . metoprolol succinate (TOPROL-XL) 100 MG 24 hr tablet Take 1 tablet (100 mg total) by mouth daily. Take with or immediately following a meal. 90 tablet 3  . Multiple Vitamin (MULTIVITAMIN) tablet Take 1 tablet by mouth daily.      Marland Kitchen telmisartan (  MICARDIS) 40 MG tablet Take 1 tablet (40 mg total) by mouth daily. 90 tablet 3  . triamcinolone cream (KENALOG) 0.1 % Apply 1 application topically 2 (two) times daily. 80 g 3  . Accu-Chek Softclix Lancets lancets Use to check blood sugars daily 100 each 3  . Blood Glucose Monitoring Suppl (ACCU-CHEK AVIVA PLUS) w/Device KIT 1 Units by Does not apply route daily. 1 kit 0  . glucose blood (ACCU-CHEK AVIVA PLUS) test strip Use to check blood sugars daily 100 each 3   No current facility-administered medications for this visit.    Allergies:   Codeine, Forteo [parathyroid hormone (recomb)], Raloxifene, Morphine, Penicillins, Sulfites, and Sulfonamide derivatives    ROS:  Please see the history of present illness.   Otherwise, review of systems are positive for back pain and joint pain.  All other systems are reviewed and negative.    PHYSICAL EXAM: VS:  BP  132/64   Pulse 64   Temp 97.9 F (36.6 C)   Ht 5' (1.524 m)   Wt 140 lb (63.5 kg)   BMI 27.34 kg/m  , BMI Body mass index is 27.34 kg/m.  GENERAL:  Well appearing NECK:  No jugular venous distention, waveform within normal limits, carotid upstroke brisk and symmetric, no bruits, no thyromegaly LUNGS:  Clear to auscultation bilaterally CHEST:  Unremarkable HEART:  PMI not displaced or sustained,S1 and S2 within normal limits, no S3, no S4, no clicks, no rubs, 3 out of 6 apical systolic murmur mid peaking and radiating up the aortic outflow tract, no diastolic murmurs ABD:  Flat, positive bowel sounds normal in frequency in pitch, no bruits, no rebound, no guarding, no midline pulsatile mass, no hepatomegaly, no splenomegaly EXT:  2 plus pulses throughout, no edema, no cyanosis no clubbing  EKG:  EKG is ordered today Sinus rhythm, rate 64, axis within normal limits, intervals within normal limits, no acute ST-T wave changes.   Recent Labs: 09/22/2019: ALT 40; BUN 14; Creatinine, Ser 0.63; Hemoglobin 13.8; Platelets 201; Potassium 4.0; Sodium 143    Lipid Panel    Component Value Date/Time   CHOL 131 09/22/2019 0833   CHOL 165 01/13/2013 0833   TRIG 141 09/22/2019 0833   TRIG 198 (H) 03/01/2017 1706   TRIG 240 (H) 01/13/2013 0833   HDL 42 09/22/2019 0833   HDL 38 (L) 03/01/2017 1706   HDL 39 (L) 01/13/2013 0833   CHOLHDL 3.1 09/22/2019 0833   LDLCALC 64 09/22/2019 0833   LDLCALC 83 03/18/2014 0819   LDLCALC 78 01/13/2013 0833      Wt Readings from Last 3 Encounters:  12/03/19 140 lb (63.5 kg)  09/22/19 138 lb 12.8 oz (63 kg)  10/23/18 143 lb (64.9 kg)      Other studies Reviewed: Additional studies/ records that were reviewed today include:  Labs Review of the above records demonstrates:  See elsewhere   ASSESSMENT AND PLAN:  HTN:     Her blood pressure is not well controlled.  I am going to switch her Micardis which she takes at noon to the evening and increase  the dose to 80 mg.  Earlier this year she had normal renal function and normal potassium.  She will let me know if she has any lightheadedness or needs any change.   PALPITATIONS:    She is not complaining of these.  No change in therapy.   CAROTID STENOSIS:  She had mild plaque in 2018.  I will follow up with carotid  Dopplers  MURMUR:   This was mild/mod AS in 2018.  I suspect this has progressed by physical exam and I will follow up with an echo.   COVID EDUCATION: She has had her vaccine.    Current medicines are reviewed at length with the patient today.  The patient does not have concerns regarding medicines.  The following changes have been made:   As above  Labs/ tests ordered today include:     No orders of the defined types were placed in this encounter.    Disposition:   FU with me in 6 months.    Signed, Minus Breeding, MD  12/03/2019 9:50 AM    [place]

## 2019-12-03 ENCOUNTER — Ambulatory Visit (INDEPENDENT_AMBULATORY_CARE_PROVIDER_SITE_OTHER): Payer: Medicare Other | Admitting: Cardiology

## 2019-12-03 ENCOUNTER — Encounter: Payer: Self-pay | Admitting: Cardiology

## 2019-12-03 ENCOUNTER — Other Ambulatory Visit: Payer: Self-pay

## 2019-12-03 VITALS — BP 132/64 | HR 64 | Temp 97.9°F | Ht 60.0 in | Wt 140.0 lb

## 2019-12-03 DIAGNOSIS — I6523 Occlusion and stenosis of bilateral carotid arteries: Secondary | ICD-10-CM | POA: Diagnosis not present

## 2019-12-03 DIAGNOSIS — I10 Essential (primary) hypertension: Secondary | ICD-10-CM | POA: Diagnosis not present

## 2019-12-03 DIAGNOSIS — Z7189 Other specified counseling: Secondary | ICD-10-CM

## 2019-12-03 DIAGNOSIS — R011 Cardiac murmur, unspecified: Secondary | ICD-10-CM | POA: Diagnosis not present

## 2019-12-03 DIAGNOSIS — R002 Palpitations: Secondary | ICD-10-CM | POA: Diagnosis not present

## 2019-12-03 MED ORDER — TELMISARTAN 80 MG PO TABS: 80.0000 mg | ORAL_TABLET | Freq: Every evening | ORAL | 3 refills | Status: DC

## 2019-12-03 NOTE — Patient Instructions (Addendum)
Medication Instructions:  Please increase Micardis to 80 mg at bedtime. Continue all other medications as listed.  *If you need a refill on your cardiac medications before your next appointment, please call your pharmacy*  Testing/Procedures: Your physician has requested that you have an echocardiogram. Echocardiography is a painless test that uses sound waves to create images of your heart. It provides your doctor with information about the size and shape of your heart and how well your heart's chambers and valves are working. This procedure takes approximately one hour. There are no restrictions for this procedure.    Your physician has requested that you have a carotid duplex. This test is an ultrasound of the carotid arteries in your neck. It looks at blood flow through these arteries that supply the brain with blood. Allow one hour for this exam. There are no restrictions or special instructions.  This will be completed at the [facility] office - 2nd floor.  Follow-Up: At [facility], you and your health needs are our priority.  As part of our continuing mission to provide you with exceptional heart care, we have created designated Provider Care Teams.  These Care Teams include your primary Cardiologist (physician) and Advanced Practice Providers (APPs -  Physician Assistants and Nurse Practitioners) who all work together to provide you with the care you need, when you need it.  We recommend signing up for the patient portal called "MyChart".  Sign up information is provided on this After Visit Summary.  MyChart is used to connect with patients for Virtual Visits (Telemedicine).  Patients are able to view lab/test results, encounter notes, upcoming appointments, etc.  Non-urgent messages can be sent to your provider as well.   To learn more about what you can do with MyChart, go to [email or web address].    Your next appointment:   6 month(s)  The format for your next appointment:     In Person  Provider:   Minus Breeding, MD   Thank you for choosing [facility]!!

## 2019-12-09 ENCOUNTER — Other Ambulatory Visit: Payer: Self-pay

## 2019-12-09 ENCOUNTER — Ambulatory Visit (HOSPITAL_COMMUNITY)
Admission: RE | Admit: 2019-12-09 | Discharge: 2019-12-09 | Disposition: A | Discharge status: Home / Self Care | Payer: Medicare Other | Source: Ambulatory Visit | Attending: Cardiovascular Disease | Admitting: Cardiovascular Disease

## 2019-12-09 DIAGNOSIS — I6523 Occlusion and stenosis of bilateral carotid arteries: Secondary | ICD-10-CM | POA: Insufficient documentation

## 2019-12-18 ENCOUNTER — Ambulatory Visit (HOSPITAL_COMMUNITY): Payer: Medicare Other | Attending: Cardiovascular Disease

## 2019-12-18 ENCOUNTER — Other Ambulatory Visit: Payer: Self-pay

## 2019-12-18 DIAGNOSIS — R002 Palpitations: Secondary | ICD-10-CM | POA: Diagnosis present

## 2019-12-18 DIAGNOSIS — R011 Cardiac murmur, unspecified: Secondary | ICD-10-CM | POA: Diagnosis present

## 2019-12-22 ENCOUNTER — Telehealth: Payer: Self-pay | Admitting: Cardiology

## 2019-12-22 ENCOUNTER — Other Ambulatory Visit (HOSPITAL_COMMUNITY): Payer: Medicare Other

## 2019-12-22 NOTE — Telephone Encounter (Signed)
   Pt is returning call regarding echo results

## 2019-12-22 NOTE — Telephone Encounter (Signed)
Spoke with pt, aware of echo results. 

## 2020-01-18 NOTE — Progress Notes (Signed)
Cardiology Office Note   Date:  01/19/2020   ID:  Melissa Carpenter, DOB Aug 25, 1933, MRN [id number]  PCP:  [name], [name], MD  Cardiologist:   Minus Breeding, MD   No chief complaint on file.     History of Present Illness: Melissa Carpenter is a 86 y.o. female who presents for follow up of palpitations and HTN.   Since I last saw her she has continued to have high blood pressures particularly in the evening.  She says that during the day her systolics will be in 782N.  When she gets up at night to go to the bathroom her head and sometimes pounding in her blood pressures in the 562Z systolic typically.  She forgot to bring her blood pressure diary.  She is not having any presyncope or syncope.  She is not having any chest pressure, neck or arm discomfort.  She has no new shortness of breath, PND or orthopnea.  She said no weight gain or edema.  At the last visit we increased her Mycardis by doubling it and having her take it instead of the morning closer to the evening.  Past Medical History:  Diagnosis Date  . Allergy   . Aortic insufficiency    Mild, echo, December, 2009  . Arthritis   . Carotid artery disease ([place])   . Cataract   . Cellulitis   . Chest pain    Catheterization, 2003, no significant CAD  . Diastolic dysfunction    Mild diastolic dysfunction, echo, December, 2012  . DJD (degenerative joint disease)   . Dyslipidemia   . Edema    November, 2012  . GERD (gastroesophageal reflux disease)   . Hyperlipidemia   . Hypertension   . Mitral valve prolapse    Echo, 2009, very mild intermittent prolapse of the posterior leaflet, no MR  . Osteoporosis   . Prediabetes   . Tachycardia    Nighttime tachycardia, February, 2014  . Thyroid nodule     Past Surgical History:  Procedure Laterality Date  . ABDOMINAL HYSTERECTOMY  1964  . APPENDECTOMY  1946  . BIOPSY THYROID Left   . CARDIAC CATHETERIZATION  sept 2003   no significant cad  . CATARACT EXTRACTION, BILATERAL    .  COLONOSCOPY    . NASAL SINUS SURGERY    . TONSILLECTOMY       Current Outpatient Medications  Medication Sig Dispense Refill  . Accu-Chek Softclix Lancets lancets Use to check blood sugars daily 100 each 3  . amLODipine-atorvastatin (CADUET) 10-40 MG tablet Take 1 tablet by mouth daily. 90 tablet 3  . aspirin 81 MG tablet Take 81 mg by mouth daily.    . Blood Glucose Monitoring Suppl (ACCU-CHEK AVIVA PLUS) w/Device KIT 1 Units by Does not apply route daily. 1 kit 0  . Calcium Carbonate-Vitamin D (CALTRATE 600+D) 600-400 MG-UNIT per tablet Take 1 tablet by mouth daily.    . cholecalciferol (VITAMIN D) 400 UNITS TABS Take 1,000 Units by mouth.      . fish oil-omega-3 fatty acids 1000 MG capsule Take 1 g by mouth daily.      . fluticasone (FLONASE) 50 MCG/ACT nasal spray SPRAY 2 SPRAYS INTO EACH NOSTRIL EVERY DAY 48 g 0  . glucose blood (ACCU-CHEK AVIVA PLUS) test strip Use to check blood sugars daily 100 each 3  . metoprolol succinate (TOPROL-XL) 100 MG 24 hr tablet Take 1 tablet (100 mg total) by mouth daily. Take with or immediately following a  meal. 90 tablet 3  . Multiple Vitamin (MULTIVITAMIN) tablet Take 1 tablet by mouth daily.      Marland Kitchen telmisartan (MICARDIS) 80 MG tablet Take 1 tablet (80 mg total) by mouth every evening. 90 tablet 3  . triamcinolone cream (KENALOG) 0.1 % Apply 1 application topically 2 (two) times daily. 80 g 3  . hydrALAZINE (APRESOLINE) 25 MG tablet Take 1 tablet (25 mg total) by mouth every evening. TAKE WITH EVENING MEDICATIONS 90 tablet 3   No current facility-administered medications for this visit.    Allergies:   Codeine, Forteo [parathyroid hormone (recomb)], Raloxifene, Morphine, Penicillins, Sulfites, and Sulfonamide derivatives    ROS:  Please see the history of present illness.   Otherwise, review of systems are positive for none.  All other systems are reviewed and negative.    PHYSICAL EXAM: VS:  BP 130/78   Pulse (!) 57   Temp (!) 97.3 F (36.3  C)   Ht 5' (1.524 m)   Wt 138 lb (62.6 kg)   SpO2 97%   BMI 26.95 kg/m  , BMI Body mass index is 26.95 kg/m.  GENERAL:  Well appearing NECK:  No jugular venous distention, waveform within normal limits, carotid upstroke brisk and symmetric, no bruits, no thyromegaly LUNGS:  Clear to auscultation bilaterally CHEST:  Unremarkable HEART:  PMI not displaced or sustained,S1 and S2 within normal limits, no S3, no S4, no clicks, no rubs, 3 out of 6 apical systolic murmur radiating slightly at aortic outflow tract, no diastolic murmurs ABD:  Flat, positive bowel sounds normal in frequency in pitch, no bruits, no rebound, no guarding, no midline pulsatile mass, no hepatomegaly, no splenomegaly EXT:  2 plus pulses throughout, no edema, no cyanosis no clubbing   EKG:  EKG is  ordered today Sinus rhythm, rate 57, axis within normal limits, intervals within normal limits, no acute ST-T wave changes.   Recent Labs: 09/22/2019: ALT 40; BUN 14; Creatinine, Ser 0.63; Hemoglobin 13.8; Platelets 201; Potassium 4.0; Sodium 143    Lipid Panel    Component Value Date/Time   CHOL 131 09/22/2019 0833   CHOL 165 01/13/2013 0833   TRIG 141 09/22/2019 0833   TRIG 198 (H) 03/01/2017 1706   TRIG 240 (H) 01/13/2013 0833   HDL 42 09/22/2019 0833   HDL 38 (L) 03/01/2017 1706   HDL 39 (L) 01/13/2013 0833   CHOLHDL 3.1 09/22/2019 0833   LDLCALC 64 09/22/2019 0833   LDLCALC 83 03/18/2014 0819   LDLCALC 78 01/13/2013 0833      Wt Readings from Last 3 Encounters:  01/19/20 138 lb (62.6 kg)  12/03/19 140 lb (63.5 kg)  09/22/19 138 lb 12.8 oz (63 kg)      Other studies Reviewed: Additional studies/ records that were reviewed today include:  Labs Review of the above records demonstrates:  See elsewhere   ASSESSMENT AND PLAN:  HTN:   Her blood pressures not well controlled in the evenings.  I am going to add hydralazine 25 mg to be taken around 8 PM when she takes her evening medicines.  She will keep  a blood pressure diary.    PALPITATIONS:   These are not typically bothering her.  No change in therapy.  CAROTID STENOSIS:    Mild bilateral stenosis in April.  No follow on necessary at this time.    AS:   This was moderate on echo in May.  I reviewed this with her today.  She was confused as she  thought she had mitral regurgitation and I told her she does have some very mild MR but this is predominantly the aortic valve that we are following and I will probably follow this with another echo in 1 year.  COVID EDUCATION: She has had her vaccine.    Current medicines are reviewed at length with the patient today.  The patient does not have concerns regarding medicines.  The following changes have been made:   As above  Labs/ tests ordered today include:     Orders Placed This Encounter  Procedures  . EKG 12-Lead     Disposition:   FU with APP in 2 months. Ronnell Guadalajara, MD  01/19/2020 2:25 PM    [place]

## 2020-01-19 ENCOUNTER — Other Ambulatory Visit: Payer: Self-pay

## 2020-01-19 ENCOUNTER — Ambulatory Visit (INDEPENDENT_AMBULATORY_CARE_PROVIDER_SITE_OTHER): Payer: Medicare Other | Admitting: Cardiology

## 2020-01-19 ENCOUNTER — Encounter: Payer: Self-pay | Admitting: Cardiology

## 2020-01-19 VITALS — BP 130/78 | HR 57 | Temp 97.3°F | Ht 60.0 in | Wt 138.0 lb

## 2020-01-19 DIAGNOSIS — I6529 Occlusion and stenosis of unspecified carotid artery: Secondary | ICD-10-CM | POA: Diagnosis not present

## 2020-01-19 DIAGNOSIS — I350 Nonrheumatic aortic (valve) stenosis: Secondary | ICD-10-CM

## 2020-01-19 DIAGNOSIS — I6523 Occlusion and stenosis of bilateral carotid arteries: Secondary | ICD-10-CM

## 2020-01-19 DIAGNOSIS — R002 Palpitations: Secondary | ICD-10-CM

## 2020-01-19 DIAGNOSIS — I10 Essential (primary) hypertension: Secondary | ICD-10-CM | POA: Diagnosis not present

## 2020-01-19 DIAGNOSIS — I341 Nonrheumatic mitral (valve) prolapse: Secondary | ICD-10-CM

## 2020-01-19 MED ORDER — HYDRALAZINE HCL 25 MG PO TABS
25.0000 mg | ORAL_TABLET | Freq: Every evening | ORAL | 3 refills | Status: DC
Start: 2020-01-19 — End: 2020-12-30

## 2020-01-19 NOTE — Patient Instructions (Addendum)
Medication Instructions:  START HYDRALAZINE 25MG  EVERY EVENING *If you need a refill on your cardiac medications before your next appointment, please call your pharmacy*  Lab Work: NONE ORDERED THIS VISIT  Testing/Procedures: NONE ORDERED THIS VISIT  Follow-Up: At [facility], you and your health needs are our priority.  As part of our continuing mission to provide you with exceptional heart care, we have created designated Provider Care Teams.  These Care Teams include your primary Cardiologist (physician) and Advanced Practice Providers (APPs -  Physician Assistants and Nurse Practitioners) who all work together to provide you with the care you need, when you need it.    Your next appointment:   2 month(s)  The format for your next appointment:   In Person  Provider:   Kerin Ransom, PA-C

## 2020-01-21 ENCOUNTER — Encounter: Payer: Self-pay | Admitting: Family Medicine

## 2020-01-21 ENCOUNTER — Other Ambulatory Visit: Payer: Self-pay

## 2020-01-21 ENCOUNTER — Ambulatory Visit (INDEPENDENT_AMBULATORY_CARE_PROVIDER_SITE_OTHER): Payer: Medicare Other | Admitting: Family Medicine

## 2020-01-21 VITALS — BP 109/56 | HR 65 | Temp 98.4°F | Ht 60.0 in | Wt 138.0 lb

## 2020-01-21 DIAGNOSIS — I6523 Occlusion and stenosis of bilateral carotid arteries: Secondary | ICD-10-CM | POA: Diagnosis not present

## 2020-01-21 DIAGNOSIS — I10 Essential (primary) hypertension: Secondary | ICD-10-CM | POA: Diagnosis not present

## 2020-01-21 DIAGNOSIS — E785 Hyperlipidemia, unspecified: Secondary | ICD-10-CM

## 2020-01-21 DIAGNOSIS — R7303 Prediabetes: Secondary | ICD-10-CM

## 2020-01-21 LAB — BAYER DCA HB A1C WAIVED: HB A1C (BAYER DCA - WAIVED): 6 % (ref <7.0)

## 2020-01-21 NOTE — Progress Notes (Signed)
BP (!) 109/56   Pulse 65   Temp 98.4 F (36.9 C) (Temporal)   Ht 5' (1.524 m)   Wt 138 lb (62.6 kg)   BMI 26.95 kg/m    Subjective:   Patient ID: Melissa Carpenter, female    DOB: January 14, 1934, 86 y.o.   MRN: [id number]  HPI: Melissa Carpenter is a 86 y.o. female presenting on 01/21/2020 for Medical Management of Chronic Issues   HPI Prediabetes Patient comes in today for recheck of his diabetes. Patient has been currently taking diet controlled currently, A1c 6.0. Patient is currently on an ACE inhibitor/ARB. Patient has not seen an ophthalmologist this year. Patient denies any issues with their feet. The symptom started onset as an adult hypertension and cholesterol ARE RELATED TO DM   Hypertension Patient is currently on amlodipine and hydralazine and metoprolol and telmisartan, and their blood pressure today is 109/56. Patient denies any lightheadedness or dizziness. Patient denies headaches, blurred vision, chest pains, shortness of breath, or weakness. Denies any side effects from medication and is content with current medication.   Hyperlipidemia Patient is coming in for recheck of his hyperlipidemia. The patient is currently taking fish oil and atorvastatin. They deny any issues with myalgias or history of liver damage from it. They deny any focal numbness or weakness or chest pain.   Relevant past medical, surgical, family and social history reviewed and updated as indicated. Interim medical history since our last visit reviewed. Allergies and medications reviewed and updated.  Review of Systems  Constitutional: Negative for chills and fever.  Eyes: Negative for visual disturbance.  Respiratory: Negative for chest tightness and shortness of breath.   Cardiovascular: Negative for chest pain and leg swelling.  Skin: Negative for rash.  Neurological: Negative for light-headedness and headaches.  Psychiatric/Behavioral: Negative for agitation and behavioral problems.  All other systems  reviewed and are negative.   Per HPI unless specifically indicated above   Allergies as of 01/21/2020      Reactions   Codeine Hives   Forteo [parathyroid Hormone (recomb)] Other (See Comments)   Weakness and calcium increase   Raloxifene Other (See Comments)   Eye problems Other reaction(s): Other (See Comments) Eye problems Eye problems   Morphine Rash   Penicillins Rash   Sulfites Rash   Sulfonamide Derivatives Rash      Medication List       Accurate as of January 21, 2020 11:59 PM. If you have any questions, ask your nurse or doctor.        Accu-Chek Aviva Plus test strip Generic drug: glucose blood Use to check blood sugars daily   Accu-Chek Aviva Plus w/Device Kit 1 Units by Does not apply route daily.   Accu-Chek Softclix Lancets lancets Use to check blood sugars daily   amLODipine-atorvastatin 10-40 MG tablet Commonly known as: CADUET Take 1 tablet by mouth daily.   aspirin 81 MG tablet Take 81 mg by mouth daily.   Calcium Carbonate-Vitamin D 600-400 MG-UNIT tablet Commonly known as: Caltrate 600+D Take 1 tablet by mouth daily.   cholecalciferol 10 MCG (400 UNIT) Tabs tablet Commonly known as: VITAMIN D3 Take 1,000 Units by mouth.   fish oil-omega-3 fatty acids 1000 MG capsule Take 1 g by mouth daily.   fluticasone 50 MCG/ACT nasal spray Commonly known as: FLONASE SPRAY 2 SPRAYS INTO EACH NOSTRIL EVERY DAY   hydrALAZINE 25 MG tablet Commonly known as: APRESOLINE Take 1 tablet (25 mg total) by mouth every evening. TAKE WITH  EVENING MEDICATIONS   metoprolol succinate 100 MG 24 hr tablet Commonly known as: TOPROL-XL Take 1 tablet (100 mg total) by mouth daily. Take with or immediately following a meal.   multivitamin tablet Take 1 tablet by mouth daily.   telmisartan 80 MG tablet Commonly known as: Micardis Take 1 tablet (80 mg total) by mouth every evening.   triamcinolone cream 0.1 % Commonly known as: KENALOG Apply 1 application  topically 2 (two) times daily.        Objective:   BP (!) 109/56   Pulse 65   Temp 98.4 F (36.9 C) (Temporal)   Ht 5' (1.524 m)   Wt 138 lb (62.6 kg)   BMI 26.95 kg/m   Wt Readings from Last 3 Encounters:  01/21/20 138 lb (62.6 kg)  01/19/20 138 lb (62.6 kg)  12/03/19 140 lb (63.5 kg)    Physical Exam Vitals and nursing note reviewed.  Constitutional:      General: She is not in acute distress.    Appearance: She is well-developed. She is not diaphoretic.  Eyes:     Conjunctiva/sclera: Conjunctivae normal.  Cardiovascular:     Rate and Rhythm: Normal rate and regular rhythm.     Heart sounds: Normal heart sounds. No murmur heard.   Pulmonary:     Effort: Pulmonary effort is normal. No respiratory distress.     Breath sounds: Normal breath sounds. No wheezing.  Skin:    General: Skin is warm and dry.     Findings: No rash.  Neurological:     Mental Status: She is alert and oriented to person, place, and time.     Coordination: Coordination normal.  Psychiatric:        Behavior: Behavior normal.     Results for orders placed or performed in visit on 01/21/20  Bayer DCA Hb A1c Waived  Result Value Ref Range   HB A1C (BAYER DCA - WAIVED) 6.0 <7.0 %    Assessment & Plan:   Problem List Items Addressed This Visit      Cardiovascular and Mediastinum   Essential hypertension (Chronic)     Other   Dyslipidemia (Chronic)   Pre-diabetes - Primary   Relevant Orders   Bayer DCA Hb A1c Waived (Completed)      A1c looks good, still diet controlled, continue current medication, no changes.  Blood pressure looks good, slightly on the lower end but denies lightheadedness dizziness. Follow up plan: Return in about 4 months (around 05/22/2020), or if symptoms worsen or fail to improve, for diabetes.  Counseling provided for all of the vaccine components Orders Placed This Encounter  Procedures  . Bayer [facility] Hb A1c [place], MD [facility] 01/22/2020, 10:32 PM

## 2020-01-29 ENCOUNTER — Telehealth: Payer: Self-pay | Admitting: Family Medicine

## 2020-01-29 NOTE — Telephone Encounter (Signed)
Pt aware she only had her A1C done at her visit not a full panel of labs

## 2020-03-22 ENCOUNTER — Telehealth: Payer: Self-pay | Admitting: Family Medicine

## 2020-03-22 NOTE — Telephone Encounter (Signed)
Appt made

## 2020-03-26 ENCOUNTER — Other Ambulatory Visit: Payer: Self-pay

## 2020-03-26 ENCOUNTER — Ambulatory Visit (INDEPENDENT_AMBULATORY_CARE_PROVIDER_SITE_OTHER): Payer: Medicare Other | Admitting: Cardiology

## 2020-03-26 ENCOUNTER — Telehealth: Payer: Self-pay | Admitting: Radiology

## 2020-03-26 VITALS — BP 140/60 | HR 65 | Ht 60.0 in | Wt 139.2 lb

## 2020-03-26 DIAGNOSIS — I6523 Occlusion and stenosis of bilateral carotid arteries: Secondary | ICD-10-CM

## 2020-03-26 DIAGNOSIS — I10 Essential (primary) hypertension: Secondary | ICD-10-CM

## 2020-03-26 DIAGNOSIS — E785 Hyperlipidemia, unspecified: Secondary | ICD-10-CM

## 2020-03-26 DIAGNOSIS — I350 Nonrheumatic aortic (valve) stenosis: Secondary | ICD-10-CM

## 2020-03-26 DIAGNOSIS — R002 Palpitations: Secondary | ICD-10-CM | POA: Diagnosis not present

## 2020-03-26 LAB — BASIC METABOLIC PANEL
BUN/Creatinine Ratio: 29 — ABNORMAL HIGH (ref 12–28)
BUN: 15 mg/dL (ref 8–27)
CO2: 25 mmol/L (ref 20–29)
Calcium: 9.8 mg/dL (ref 8.7–10.3)
Chloride: 102 mmol/L (ref 96–106)
Creatinine, Ser: 0.51 mg/dL — ABNORMAL LOW (ref 0.57–1.00)
GFR calc Af Amer: 102 mL/min/{1.73_m2} (ref >59)
GFR calc non Af Amer: 89 mL/min/{1.73_m2} (ref >59)
Glucose: 99 mg/dL (ref 65–99)
Potassium: 4.5 mmol/L (ref 3.5–5.2)
Sodium: 139 mmol/L (ref 134–144)

## 2020-03-26 NOTE — Patient Instructions (Addendum)
Medication Instructions:  Your physician recommends that you continue on your current medications as directed. Please refer to the Current Medication list given to you today.  *If you need a refill on your cardiac medications before your next appointment, please call your pharmacy*    Labs:  Your physician recommends that you return for lab work today: BMET    Testing/Procedures: [place] Instructions   Your physician has requested you wear your ZIO patch monitor___14___days.   This is a single patch monitor.  [employer] supplies one patch monitor per enrollment.  Additional stickers are not available.   Please do not apply patch if you will be having a Nuclear Stress Test, Echocardiogram, Cardiac CT, MRI, or Chest Xray during the time frame you would be wearing the monitor. The patch cannot be worn during these tests.  You cannot remove and re-apply the ZIO XT patch monitor.   Your ZIO patch monitor will be sent USPS Priority mail from [facility] directly to your home address. The monitor may also be mailed to a PO BOX if home delivery is not available.   It may take 3-5 days to receive your monitor after you have been enrolled.   Once you have received you monitor, please review enclosed instructions.  Your monitor has already been registered assigning a specific monitor serial # to you.   Applying the monitor   Shave hair from upper left chest.   Hold abrader disc by orange tab.  Rub abrader in 40 strokes over left upper chest as indicated in your monitor instructions.   Clean area with 4 enclosed alcohol pads .  Use all pads to assure are is cleaned thoroughly.  Let dry.   Apply patch as indicated in monitor instructions.  Patch will be place under collarbone on left side of chest with arrow pointing upward.   Rub patch adhesive wings for 2 minutes.Remove white label marked "1".  Remove white label marked "2".  Rub patch adhesive wings for 2 additional  minutes.   While looking in a mirror, press and release button in center of patch.  A small green light will flash 3-4 times .  This will be your only indicator the monitor has been turned on.     Do not shower for the first 24 hours.  You may shower after the first 24 hours.   Press button if you feel a symptom. You will hear a small click.  Record Date, Time and Symptom in the Patient Log Book.   When you are ready to remove patch, follow instructions on last 2 pages of Patient Log Book.  Stick patch monitor onto last page of Patient Log Book.   Place Patient Log Book in [place] box.  Use locking tab on box and tape box closed securely.  The [employer] and [employer] has [employer] on it.  Please place in mailbox as soon as possible.  Your physician should have your test results approximately 7 days after the monitor has been mailed back to [facility].   Call [place] at [phone] if you have questions regarding your ZIO XT patch monitor.  Call them immediately if you see an orange light blinking on your monitor.   If your monitor falls off in less than 4 days contact our Monitor department at [phone].  If your monitor becomes loose or falls off after 4 days call [employer] at [phone] for suggestions on securing your monitor.  Follow-Up: At [facility], you and your health needs are our priority.  As part of our continuing mission to provide you with exceptional heart care, we have created designated Provider Care Teams.  These Care Teams include your primary Cardiologist (physician) and Advanced Practice Providers (APPs -  Physician Assistants and Nurse Practitioners) who all work together to provide you with the care you need, when you need it.  We recommend signing up for the patient portal called "MyChart".  Sign up information is provided on this After Visit Summary.  MyChart is used to connect with patients for Virtual Visits (Telemedicine).   Patients are able to view lab/test results, encounter notes, upcoming appointments, etc.  Non-urgent messages can be sent to your provider as well.   To learn more about what you can do with MyChart, go to [email or web address].    Your next appointment:   December 2021  The format for your next appointment:   In Person  Provider:   Minus Breeding, MD  Your physician recommends that you schedule a NURSE VISIT appointment at your earliest convenience for  [place]. Please bring your home blood pressure cuff to this visit.

## 2020-03-26 NOTE — Assessment & Plan Note (Signed)
R/O arrhythmia-check 14 day ZIO

## 2020-03-26 NOTE — Telephone Encounter (Signed)
Enrolled patient for a 14 day Zio XT  monitor to be mailed to patients home  °

## 2020-03-26 NOTE — Assessment & Plan Note (Signed)
She describes elevated B/P and tachycardia after waking from vivid dreams- this may be normal physiologic response. Her B/P in the office is controlled and does not correlate with her home cuff readings which are much higher. I have asked her to bring in her home cuff to compare.  Check BMP today on increased Micardis. I would also note that I have had patients in the past c/o nightmares and vivid dreams on beta blocker Rx- Toprol in particular. If this becomes more of an issue I would consider changing this to Coreg.

## 2020-03-26 NOTE — Assessment & Plan Note (Signed)
Mild plaque April 2021- no f/u recomended

## 2020-03-26 NOTE — Progress Notes (Signed)
Cardiology Office Note:    Date:  03/26/2020   ID:  Melissa Carpenter, DOB 1933-08-26, MRN [id number]  PCP:  [name], [name], MD  Cardiologist:  No primary care provider on file.  Electrophysiologist:  None   Referring MD: [name], [name], MD   CC: palpitations  History of Present Illness:    Melissa Carpenter is a pleasant 86 y.o. female with a hx of hypertension, mild to moderate aortic stenosis, treated dyslipidemia, normal coronaries in 2003, and palpitations.  She lives at home with her husband who is also a patient of ours, she is his primary caregiver.  Patient is seen in follow-up today from an office visit with [name] in June 2021.  At that visit she had complained of some palpitations and had noted elevated blood pressure in the evenings.  [name] added hydralazine 25 mg in the evening and increased her Micardis to 80 mg a day.  She returns today for follow-up.  She brought a list of several blood pressures taken with her home cuff.  At home her systolic blood pressure appears to run 140-160 average.  Blood pressure by me in the office today was 120/60.  The patient describes vivid dreams at night.  She says sometimes when she wakes up she feels like her heart is pounding.  She describes tachycardia.  When she does wake up she checks her blood pressure and it is always high.  Past Medical History:  Diagnosis Date  . Allergy   . Aortic insufficiency    Mild, echo, December, 2009  . Arthritis   . Carotid artery disease ([place])   . Cataract   . Cellulitis   . Chest pain    Catheterization, 2003, no significant CAD  . Diastolic dysfunction    Mild diastolic dysfunction, echo, December, 2012  . DJD (degenerative joint disease)   . Dyslipidemia   . Edema    November, 2012  . GERD (gastroesophageal reflux disease)   . Hyperlipidemia   . Hypertension   . Mitral valve prolapse    Echo, 2009, very mild intermittent prolapse of the posterior leaflet, no MR  . Osteoporosis    . Prediabetes   . Tachycardia    Nighttime tachycardia, February, 2014  . Thyroid nodule     Past Surgical History:  Procedure Laterality Date  . ABDOMINAL HYSTERECTOMY  1964  . APPENDECTOMY  1946  . BIOPSY THYROID Left   . CARDIAC CATHETERIZATION  sept 2003   no significant cad  . CATARACT EXTRACTION, BILATERAL    . COLONOSCOPY    . NASAL SINUS SURGERY    . TONSILLECTOMY      Current Medications: Current Meds  Medication Sig  . Accu-Chek Softclix Lancets lancets Use to check blood sugars daily  . amLODipine-atorvastatin (CADUET) 10-40 MG tablet Take 1 tablet by mouth daily.  Marland Kitchen aspirin 81 MG tablet Take 81 mg by mouth daily.  . Blood Glucose Monitoring Suppl (ACCU-CHEK AVIVA PLUS) w/Device KIT 1 Units by Does not apply route daily.  . Calcium Carbonate-Vitamin D (CALTRATE 600+D) 600-400 MG-UNIT per tablet Take 1 tablet by mouth daily.  . cholecalciferol (VITAMIN D) 400 UNITS TABS Take 1,000 Units by mouth.    . fish oil-omega-3 fatty acids 1000 MG capsule Take 1 g by mouth daily.    . fluticasone (FLONASE) 50 MCG/ACT nasal spray SPRAY 2 SPRAYS INTO EACH NOSTRIL EVERY DAY  . glucose blood (ACCU-CHEK AVIVA PLUS) test strip Use to check blood sugars daily  .  hydrALAZINE (APRESOLINE) 25 MG tablet Take 1 tablet (25 mg total) by mouth every evening. TAKE WITH EVENING MEDICATIONS  . metoprolol succinate (TOPROL-XL) 100 MG 24 hr tablet Take 1 tablet (100 mg total) by mouth daily. Take with or immediately following a meal.  . Multiple Vitamin (MULTIVITAMIN) tablet Take 1 tablet by mouth daily.    Marland Kitchen telmisartan (MICARDIS) 80 MG tablet Take 1 tablet (80 mg total) by mouth every evening.  . triamcinolone cream (KENALOG) 0.1 % Apply 1 application topically 2 (two) times daily.     Allergies:   Codeine, Forteo [parathyroid hormone (recomb)], Raloxifene, Morphine, Penicillins, Sulfites, and Sulfonamide derivatives   Social History   Socioeconomic History  . Marital status: Married     Spouse name: Not on file  . Number of children: 4  . Years of education: Not on file  . Highest education level: Not on file  Occupational History  . Occupation: retired    Associate Professor: [employer]  Tobacco Use  . Smoking status: Never Smoker  . Smokeless tobacco: Never Used  Vaping Use  . Vaping Use: Never used  Substance and Sexual Activity  . Alcohol use: No    Alcohol/week: 0.0 standard drinks  . Drug use: No  . Sexual activity: Never  Other Topics Concern  . Not on file  Social History Narrative  . Not on file   Social Determinants of Health   Financial Resource Strain:   . Difficulty of Paying Living Expenses:   Food Insecurity:   . Worried About Programme researcher, broadcasting/film/video in the Last Year:   . Barista in the Last Year:   Transportation Needs:   . Freight forwarder (Medical):   Marland Kitchen Lack of Transportation (Non-Medical):   Physical Activity:   . Days of Exercise per Week:   . Minutes of Exercise per Session:   Stress:   . Feeling of Stress :   Social Connections:   . Frequency of Communication with Friends and Family:   . Frequency of Social Gatherings with Friends and Family:   . Attends Religious Services:   . Active Member of Clubs or Organizations:   . Attends Banker Meetings:   Marland Kitchen Marital Status:      Family History: The patient's family history includes Bone cancer in her grandchild, sister, and sister; Breast cancer in her sister; Cirrhosis in her brother; GER disease in her sister; Heart attack in her brother, mother, and sister; Heart disease in her father, mother, and sister; Hyperlipidemia in her sister; Hypertension in her father, mother, and sister; Kidney disease in her father; Metabolic syndrome in her sister; Stroke in her sister and son; Throat cancer in her mother. There is no history of Colitis, Esophageal cancer, Stomach cancer, or Rectal cancer.  ROS:   Please see the history of present illness.     All other systems  reviewed and are negative.  EKGs/Labs/Other Studies Reviewed:    The following studies were reviewed today:  Echo 12/18/2019- IMPRESSIONS    1. Left ventricular ejection fraction, by estimation, is 60 to 65%. The  left ventricle has normal function. The left ventricle has no regional  wall motion abnormalities. Left ventricular diastolic parameters are  consistent with Grade II diastolic  dysfunction (pseudonormalization). The average left ventricular global  longitudinal strain is -20.1 %.  2. Right ventricular systolic function is normal. The right ventricular  size is normal. There is mildly elevated pulmonary artery systolic  pressure.  3. The mitral valve is grossly normal. Mild mitral valve regurgitation.  4. The aortic valve is tricuspid. Aortic valve regurgitation is not  visualized. Moderate aortic valve stenosis.   Comparison(s): 06/28/17 EF 65-70%. Mild-moderate AS 88mHg mean PG. 379mg  peak PG. PA pressure 3354m.   EKG:  EKG is not ordered today.  The ekg ordered 01/19/2020 demonstrates NSR-SB HR57  Recent Labs: 09/22/2019: ALT 40; BUN 14; Creatinine, Ser 0.63; Hemoglobin 13.8; Platelets 201; Potassium 4.0; Sodium 143  Recent Lipid Panel    Component Value Date/Time   CHOL 131 09/22/2019 0833   CHOL 165 01/13/2013 0833   TRIG 141 09/22/2019 0833   TRIG 198 (H) 03/01/2017 1706   TRIG 240 (H) 01/13/2013 0833   HDL 42 09/22/2019 0833   HDL 38 (L) 03/01/2017 1706   HDL 39 (L) 01/13/2013 0833   CHOLHDL 3.1 09/22/2019 0833   LDLCALC 64 09/22/2019 0833   LDLCALC 83 03/18/2014 0819   LDLCALC 78 01/13/2013 0833    Physical Exam:    VS:  BP 140/60   Pulse 65   Ht 5' (1.524 m)   Wt 139 lb 3.2 oz (63.1 kg)   SpO2 97%   BMI 27.19 kg/m     Wt Readings from Last 3 Encounters:  03/26/20 139 lb 3.2 oz (63.1 kg)  01/21/20 138 lb (62.6 kg)  01/19/20 138 lb (62.6 kg)     GEN: Well nourished, well developed in no acute distress HEENT: Normal NECK: No JVD; No  carotid bruits CARDIAC:RRR, 2/6 systolic murmur AOV LSB, no rubs, gallops RESPIRATORY:  Clear to auscultation without rales, wheezing or rhonchi  ABDOMEN: Soft, non-tender, non-distended MUSCULOSKELETAL:  No edema; No deformity  SKIN: Warm and dry NEUROLOGIC:  Alert and oriented x 3 PSYCHIATRIC:  Normal affect   ASSESSMENT:    Palpitations R/O arrhythmia-check 14 day ZIO   Essential hypertension She describes elevated B/P and tachycardia after waking from vivid dreams- this may be normal physiologic response. Her B/P in the office is controlled and does not correlate with her home cuff readings which are much higher. I have asked her to bring in her home cuff to compare.  Check BMP today on increased Micardis. I would also note that I have had patients in the past c/o nightmares and vivid dreams on beta blocker Rx- Toprol in particular. If this becomes more of an issue I would consider changing this to Coreg.   Mild aortic stenosis Mild to moderate AS by echo May 2021  Car[place] artery [place] 2021- no f/u recomended  Dyslipidemia LDL 64 Feb 2021  PLAN:    Check BMP, check home cuff against ours, check 14 day ZIO.  [facility]   Medication Adjustments/Labs and Tests Ordered: Current medicines are reviewed at length with the patient today.  Concerns regarding medicines are outlined above.  Orders Placed This Encounter  Procedures  . Basic metabolic panel  . LONG TERM MONITOR (3-14 DAYS)   No orders of the defined types were placed in this encounter.   Patient Instructions  Medication Instructions:  Your physician recommends that you continue on your current medications as directed. Please refer to the Current Medication list given to you today.  *If you need a refill on your cardiac medications before your next appointment, please call your pharmacy*    Labs:  Your physician recommends that you return for lab work today:  BMET    Testing/Procedures: [facility]  Instructions   Your physician has requested you wear your ZIO patch monitor___14___days.   This is a single patch monitor.  [employer] supplies one patch monitor per enrollment.  Additional stickers are not available.   Please do not apply patch if you will be having a Nuclear Stress Test, Echocardiogram, Cardiac CT, MRI, or Chest Xray during the time frame you would be wearing the monitor. The patch cannot be worn during these tests.  You cannot remove and re-apply the ZIO XT patch monitor.   Your ZIO patch monitor will be sent USPS Priority mail from [facility] directly to your home address. The monitor may also be mailed to a PO BOX if home delivery is not available.   It may take 3-5 days to receive your monitor after you have been enrolled.   Once you have received you monitor, please review enclosed instructions.  Your monitor has already been registered assigning a specific monitor serial # to you.   Applying the monitor   Shave hair from upper left chest.   Hold abrader disc by orange tab.  Rub abrader in 40 strokes over left upper chest as indicated in your monitor instructions.   Clean area with 4 enclosed alcohol pads .  Use all pads to assure are is cleaned thoroughly.  Let dry.   Apply patch as indicated in monitor instructions.  Patch will be place under collarbone on left side of chest with arrow pointing upward.   Rub patch adhesive wings for 2 minutes.Remove white label marked "1".  Remove white label marked "2".  Rub patch adhesive wings for 2 additional minutes.   While looking in a mirror, press and release button in center of patch.  A small green light will flash 3-4 times .  This will be your only indicator the monitor has been turned on.     Do not shower for the first 24 hours.  You may shower after the first 24 hours.   Press button if you feel a symptom. You will hear a small click.  Record Date,  Time and Symptom in the Patient Log Book.   When you are ready to remove patch, follow instructions on last 2 pages of Patient Log Book.  Stick patch monitor onto last page of Patient Log Book.   Place Patient Log Book in [place] box.  Use locking tab on box and tape box closed securely.  The [employer] has [employer] on it.  Please place in mailbox as soon as possible.  Your physician should have your test results approximately 7 days after the monitor has been mailed back to [facility].   Call [place] at [phone] if you have questions regarding your ZIO XT patch monitor.  Call them immediately if you see an orange light blinking on your monitor.   If your monitor falls off in less than 4 days contact our Monitor department at [phone].  If your monitor becomes loose or falls off after 4 days call [employer] at [phone] for suggestions on securing your monitor.     Follow-Up: At [facility], you and your health needs are our priority.  As part of our continuing mission to provide you with exceptional heart care, we have created designated Provider Care Teams.  These Care Teams include your primary Cardiologist (physician) and Advanced Practice Providers (APPs -  Physician Assistants and Nurse Practitioners) who all work together to provide you with the care you need, when you  need it.  We recommend signing up for the patient portal called "MyChart".  Sign up information is provided on this After Visit Summary.  MyChart is used to connect with patients for Virtual Visits (Telemedicine).  Patients are able to view lab/test results, encounter notes, upcoming appointments, etc.  Non-urgent messages can be sent to your provider as well.   To learn more about what you can do with MyChart, go to [email or web address].    Your next appointment:   December 2021  The format for your next appointment:   In Person  Provider:   Minus Breeding,  MD  Your physician recommends that you schedule a NURSE VISIT appointment at your earliest convenience for  [place]. Please bring your home blood pressure cuff to this visit.      Angelena Form, PA-C  03/26/2020 10:38 AM    [facility]

## 2020-03-26 NOTE — Assessment & Plan Note (Signed)
Mild to moderate AS by echo May 2021

## 2020-03-26 NOTE — Assessment & Plan Note (Signed)
LDL 64 Feb 2021

## 2020-03-26 NOTE — Addendum Note (Signed)
Addended by: Therisa Doyne on: 03/26/2020 10:56 AM   Modules accepted: Orders

## 2020-03-30 ENCOUNTER — Ambulatory Visit (INDEPENDENT_AMBULATORY_CARE_PROVIDER_SITE_OTHER): Payer: Medicare Other

## 2020-03-30 DIAGNOSIS — R002 Palpitations: Secondary | ICD-10-CM | POA: Diagnosis not present

## 2020-04-07 ENCOUNTER — Other Ambulatory Visit: Payer: Self-pay

## 2020-04-07 ENCOUNTER — Ambulatory Visit (INDEPENDENT_AMBULATORY_CARE_PROVIDER_SITE_OTHER): Payer: Medicare Other | Admitting: Family Medicine

## 2020-04-07 ENCOUNTER — Encounter: Payer: Self-pay | Admitting: Family Medicine

## 2020-04-07 VITALS — BP 128/58 | HR 63 | Temp 98.1°F | Ht 61.0 in | Wt 137.0 lb

## 2020-04-07 DIAGNOSIS — M19011 Primary osteoarthritis, right shoulder: Secondary | ICD-10-CM

## 2020-04-07 DIAGNOSIS — R5383 Other fatigue: Secondary | ICD-10-CM

## 2020-04-07 DIAGNOSIS — I6523 Occlusion and stenosis of bilateral carotid arteries: Secondary | ICD-10-CM

## 2020-04-07 DIAGNOSIS — R6889 Other general symptoms and signs: Secondary | ICD-10-CM | POA: Diagnosis not present

## 2020-04-07 NOTE — Progress Notes (Signed)
BP (!) 128/58   Pulse 63   Temp 98.1 F (36.7 C)   Ht '5\' 1"'  (1.549 m)   Wt 137 lb (62.1 kg)   SpO2 (!) 64%   BMI 25.89 kg/m    Subjective:   Patient ID: Melissa Carpenter, female    DOB: Aug 20, 1933, 86 y.o.   MRN: [id number]  HPI: Melissa Carpenter is a 86 y.o. female presenting on 04/07/2020 for Reck thyroid (c/o feeling hot and tired) and Shoulder Pain (right. Comes and goes. Trouble lifting arm at times.)   HPI Patient is coming in today with complaints of feeling hot and tired, she gets hot easily when she is not supposed to and then she feels tired and fatigued and energy is down.  She says that she has had a normal appetite and that has not changed and she is denying any more hair loss than what she would expect for her age.  She does complain of difficulty sleeping with age and that she has trouble falling asleep but once she does, she is able to stay asleep.  Patient also comes in complaining of right shoulder pain and difficulty with overhead movements, it is her right shoulder that is the problem.  She says she can move it forward and back and down in every direction except for when it goes up she has trouble and it hurts more when he goes up and trying to go over her head.  She says it is very stiff early on in the day and then it does loosen up a little bit but still has some stiffness to it.  Relevant past medical, surgical, family and social history reviewed and updated as indicated. Interim medical history since our last visit reviewed. Allergies and medications reviewed and updated.  Review of Systems  Constitutional: Negative for chills and fever.  Eyes: Negative for visual disturbance.  Respiratory: Negative for chest tightness and shortness of breath.   Cardiovascular: Negative for chest pain and leg swelling.  Musculoskeletal: Positive for arthralgias. Negative for back pain, gait problem and joint swelling.  Skin: Negative for rash.  Neurological: Negative for  light-headedness and headaches.  Psychiatric/Behavioral: Negative for agitation and behavioral problems.  All other systems reviewed and are negative.   Per HPI unless specifically indicated above   Allergies as of 04/07/2020      Reactions   Codeine Hives   Forteo [parathyroid Hormone (recomb)] Other (See Comments)   Weakness and calcium increase   Raloxifene Other (See Comments)   Eye problems Other reaction(s): Other (See Comments) Eye problems Eye problems   Morphine Rash   Penicillins Rash   Sulfites Rash   Sulfonamide Derivatives Rash      Medication List       Accurate as of April 07, 2020  9:42 AM. If you have any questions, ask your nurse or doctor.        Accu-Chek Aviva Plus test strip Generic drug: glucose blood Use to check blood sugars daily   Accu-Chek Aviva Plus w/Device Kit 1 Units by Does not apply route daily.   Accu-Chek Softclix Lancets lancets Use to check blood sugars daily   amLODipine-atorvastatin 10-40 MG tablet Commonly known as: CADUET Take 1 tablet by mouth daily.   aspirin 81 MG tablet Take 81 mg by mouth daily.   Calcium Carbonate-Vitamin D 600-400 MG-UNIT tablet Commonly known as: Caltrate 600+D Take 1 tablet by mouth daily.   cholecalciferol 10 MCG (400 UNIT) Tabs tablet Commonly known as:  VITAMIN D3 Take 1,000 Units by mouth.   fish oil-omega-3 fatty acids 1000 MG capsule Take 1 g by mouth daily.   fluticasone 50 MCG/ACT nasal spray Commonly known as: FLONASE SPRAY 2 SPRAYS INTO EACH NOSTRIL EVERY DAY   hydrALAZINE 25 MG tablet Commonly known as: APRESOLINE Take 1 tablet (25 mg total) by mouth every evening. TAKE WITH EVENING MEDICATIONS   metoprolol succinate 100 MG 24 hr tablet Commonly known as: TOPROL-XL Take 1 tablet (100 mg total) by mouth daily. Take with or immediately following a meal.   multivitamin tablet Take 1 tablet by mouth daily.   telmisartan 80 MG tablet Commonly known as: Micardis Take 1  tablet (80 mg total) by mouth every evening.   triamcinolone cream 0.1 % Commonly known as: KENALOG Apply 1 application topically 2 (two) times daily.        Objective:   BP (!) 128/58   Pulse 63   Temp 98.1 F (36.7 C)   Ht '5\' 1"'  (1.549 m)   Wt 137 lb (62.1 kg)   SpO2 (!) 64%   BMI 25.89 kg/m   Wt Readings from Last 3 Encounters:  04/07/20 137 lb (62.1 kg)  03/26/20 139 lb 3.2 oz (63.1 kg)  01/21/20 138 lb (62.6 kg)    Physical Exam Vitals and nursing note reviewed.  Constitutional:      General: She is not in acute distress.    Appearance: She is well-developed. She is not diaphoretic.  Eyes:     Conjunctiva/sclera: Conjunctivae normal.  Cardiovascular:     Rate and Rhythm: Normal rate and regular rhythm.     Heart sounds: Normal heart sounds. No murmur heard.   Pulmonary:     Effort: Pulmonary effort is normal. No respiratory distress.     Breath sounds: Normal breath sounds. No wheezing.  Musculoskeletal:     Right shoulder: Tenderness and crepitus present. No swelling or bony tenderness. Normal range of motion. Normal strength.  Skin:    General: Skin is warm and dry.     Findings: No rash.  Neurological:     Mental Status: She is alert and oriented to person, place, and time.     Coordination: Coordination normal.  Psychiatric:        Behavior: Behavior normal.       Assessment & Plan:   Problem List Items Addressed This Visit    None    Visit Diagnoses    Heat intolerance    -  Primary   Relevant Orders   CBC with Differential/Platelet   Thyroid Panel With TSH   Other fatigue       Relevant Orders   CBC with Differential/Platelet   Thyroid Panel With TSH   Primary osteoarthritis of right shoulder          Recommended exercise regiment to strengthen and gain range of motion with the shoulders, if does not work well on her own then we will do physical therapy referral.  We will check thyroid and blood counts for heat intolerance and  fatigue Follow up plan: Return if symptoms worsen or fail to improve.  Counseling provided for all of the vaccine components Orders Placed This Encounter  Procedures  . CBC with Differential/Platelet  . Thyroid Panel With TSH    Caryl Pina, MD [facility] 04/07/2020, 9:42 AM

## 2020-04-08 LAB — CBC WITH DIFFERENTIAL/PLATELET
Basophils Absolute: 0 10*3/uL (ref 0.0–0.2)
Basos: 1 %
EOS (ABSOLUTE): 0.3 10*3/uL (ref 0.0–0.4)
Eos: 5 %
Hematocrit: 33.9 % — ABNORMAL LOW (ref 34.0–46.6)
Hemoglobin: 12 g/dL (ref 11.1–15.9)
Immature Grans (Abs): 0 10*3/uL (ref 0.0–0.1)
Immature Granulocytes: 0 %
Lymphocytes Absolute: 1.6 10*3/uL (ref 0.7–3.1)
Lymphs: 28 %
MCH: 32.3 pg (ref 26.6–33.0)
MCHC: 35.4 g/dL (ref 31.5–35.7)
MCV: 91 fL (ref 79–97)
Monocytes Absolute: 0.7 10*3/uL (ref 0.1–0.9)
Monocytes: 11 %
Neutrophils Absolute: 3.1 10*3/uL (ref 1.4–7.0)
Neutrophils: 55 %
Platelets: 204 10*3/uL (ref 150–450)
RBC: 3.71 x10E6/uL — ABNORMAL LOW (ref 3.77–5.28)
RDW: 13 % (ref 11.7–15.4)
WBC: 5.7 10*3/uL (ref 3.4–10.8)

## 2020-04-08 LAB — THYROID PANEL WITH TSH
Free Thyroxine Index: 1.7 (ref 1.2–4.9)
T3 Uptake Ratio: 24 % (ref 24–39)
T4, Total: 7.2 ug/dL (ref 4.5–12.0)
TSH: 1.48 u[IU]/mL (ref 0.450–4.500)

## 2020-04-28 ENCOUNTER — Telehealth: Payer: Self-pay | Admitting: Cardiology

## 2020-04-28 NOTE — Telephone Encounter (Signed)
Patient aware report uploaded and to be reviewed by PA.  Once reviewed will call with results.

## 2020-04-28 NOTE — Telephone Encounter (Signed)
Patient calling about monitor results. She states she turned the monitor in about 2 weeks ago. I advised that the doctor still had to read the findings and we would call with results. Patient understood but wants to know if things can be sped up. Please advise.

## 2020-05-06 NOTE — Telephone Encounter (Signed)
Patient is calling back to her results to event monitor.

## 2020-05-06 NOTE — Telephone Encounter (Signed)
Erlene Quan, PA-C  05/06/2020 8:45 AM EDT     Please let the patient know her monitor looked good- no irregular heart rhythm noted. If she is doing OK keep f/u with dr Percival Spanish in Dec. If she feels she needs to be seen before then I can do a virtual visit with her.  Kerin Ransom PA-C 05/06/2020 8:45 AM   Informed patient of the above monitor results. Patient verbalized understanding and stated she is doing okay.

## 2020-05-20 ENCOUNTER — Encounter: Payer: Self-pay | Admitting: Family Medicine

## 2020-05-20 ENCOUNTER — Ambulatory Visit (INDEPENDENT_AMBULATORY_CARE_PROVIDER_SITE_OTHER): Payer: Medicare Other | Admitting: Family Medicine

## 2020-05-20 ENCOUNTER — Other Ambulatory Visit: Payer: Self-pay

## 2020-05-20 VITALS — BP 146/69 | HR 62 | Temp 98.0°F | Ht 61.0 in | Wt 138.0 lb

## 2020-05-20 DIAGNOSIS — R7303 Prediabetes: Secondary | ICD-10-CM | POA: Diagnosis not present

## 2020-05-20 DIAGNOSIS — I6523 Occlusion and stenosis of bilateral carotid arteries: Secondary | ICD-10-CM

## 2020-05-20 DIAGNOSIS — E785 Hyperlipidemia, unspecified: Secondary | ICD-10-CM

## 2020-05-20 DIAGNOSIS — K449 Diaphragmatic hernia without obstruction or gangrene: Secondary | ICD-10-CM

## 2020-05-20 DIAGNOSIS — Z23 Encounter for immunization: Secondary | ICD-10-CM | POA: Diagnosis not present

## 2020-05-20 DIAGNOSIS — K219 Gastro-esophageal reflux disease without esophagitis: Secondary | ICD-10-CM

## 2020-05-20 DIAGNOSIS — I10 Essential (primary) hypertension: Secondary | ICD-10-CM | POA: Diagnosis not present

## 2020-05-20 MED ORDER — ACCU-CHEK AVIVA PLUS W/DEVICE KIT: 1.0000 [IU] | PACK | Freq: Every day | 0 refills | Status: DC

## 2020-05-20 MED ORDER — AMLODIPINE-ATORVASTATIN 10-40 MG PO TABS: 1.0000 | ORAL_TABLET | Freq: Every day | ORAL | 3 refills | Status: DC

## 2020-05-20 MED ORDER — METOPROLOL SUCCINATE ER 100 MG PO TB24: 100.0000 mg | ORAL_TABLET | Freq: Every day | ORAL | 3 refills | Status: DC

## 2020-05-20 NOTE — Progress Notes (Signed)
BP (!) 146/69   Pulse 62   Temp 98 F (36.7 C)   Ht '5\' 1"'  (1.549 m)   Wt 138 lb (62.6 kg)   SpO2 96%   BMI 26.07 kg/m    Subjective:   Patient ID: Melissa Carpenter, female    DOB: [birth year], 86 y.o.   MRN: [id number]  HPI: Melissa Carpenter is a 86 y.o. female presenting on 05/20/2020 for Medical Management of Chronic Issues and Hypertension   HPI Hypertension Patient is currently on amlodipine and metoprolol and Micardis and hydralazine, and their blood pressure today is 146/69 but at home she runs between 1 teens in the 150s.. Patient denies any lightheadedness or dizziness. Patient denies headaches, blurred vision, chest pains, shortness of breath, or weakness. Denies any side effects from medication and is content with current medication.   Prediabetes Patient comes in today for recheck of his diabetes. Patient has been currently taking no medication currently and just following and monitoring for now. blood sugars at home are consistently running between 110s and 140s.. Patient is currently on an ACE inhibitor/ARB. Patient has not seen an ophthalmologist this year. Patient denies any issues with their feet. The symptom started onset as an adult hyperlipidemia and hypertension and GERD ARE RELATED TO DM   Hyperlipidemia Patient is coming in for recheck of his hyperlipidemia. The patient is currently taking atorvastatin and fish oil. They deny any issues with myalgias or history of liver damage from it. They deny any focal numbness or weakness or chest pain.    GERD Patient is currently on no medication currently and is doing fine..  She denies any major symptoms or abdominal pain or belching or burping. She denies any blood in her stool or lightheadedness or dizziness.   Relevant past medical, surgical, family and social history reviewed and updated as indicated. Interim medical history since our last visit reviewed. Allergies and medications reviewed and updated.  Review of Systems    Constitutional: Negative for chills and fever.  Eyes: Negative for redness and visual disturbance.  Respiratory: Negative for chest tightness and shortness of breath.   Cardiovascular: Negative for chest pain and leg swelling.  Musculoskeletal: Negative for back pain and gait problem.  Skin: Negative for rash.  Neurological: Negative for light-headedness and headaches.  Psychiatric/Behavioral: Negative for agitation and behavioral problems.  All other systems reviewed and are negative.   Per HPI unless specifically indicated above   Allergies as of 05/20/2020      Reactions   Codeine Hives   Forteo [parathyroid Hormone (recomb)] Other (See Comments)   Weakness and calcium increase   Raloxifene Other (See Comments)   Eye problems Other reaction(s): Other (See Comments) Eye problems Eye problems   Morphine Rash   Penicillins Rash   Sulfites Rash   Sulfonamide Derivatives Rash      Medication List       Accurate as of May 20, 2020 11:39 AM. If you have any questions, ask your nurse or doctor.        Accu-Chek Aviva Plus test strip Generic drug: glucose blood Use to check blood sugars daily   Accu-Chek Aviva Plus w/Device Kit 1 Units by Does not apply route daily.   Accu-Chek Softclix Lancets lancets Use to check blood sugars daily   amLODipine-atorvastatin 10-40 MG tablet Commonly known as: CADUET Take 1 tablet by mouth daily.   aspirin 81 MG tablet Take 81 mg by mouth daily.   Calcium Carbonate-Vitamin D 600-400 MG-UNIT tablet  Commonly known as: Caltrate 600+D Take 1 tablet by mouth daily.   cholecalciferol 10 MCG (400 UNIT) Tabs tablet Commonly known as: VITAMIN D3 Take 1,000 Units by mouth.   fish oil-omega-3 fatty acids 1000 MG capsule Take 1 g by mouth daily.   fluticasone 50 MCG/ACT nasal spray Commonly known as: FLONASE SPRAY 2 SPRAYS INTO EACH NOSTRIL EVERY DAY   hydrALAZINE 25 MG tablet Commonly known as: APRESOLINE Take 1 tablet (25 mg  total) by mouth every evening. TAKE WITH EVENING MEDICATIONS   metoprolol succinate 100 MG 24 hr tablet Commonly known as: TOPROL-XL Take 1 tablet (100 mg total) by mouth daily. Take with or immediately following a meal.   multivitamin tablet Take 1 tablet by mouth daily.   telmisartan 80 MG tablet Commonly known as: Micardis Take 1 tablet (80 mg total) by mouth every evening.   triamcinolone cream 0.1 % Commonly known as: KENALOG Apply 1 application topically 2 (two) times daily.        Objective:   BP (!) 146/69   Pulse 62   Temp 98 F (36.7 C)   Ht '5\' 1"'  (1.549 m)   Wt 138 lb (62.6 kg)   SpO2 96%   BMI 26.07 kg/m   Wt Readings from Last 3 Encounters:  05/20/20 138 lb (62.6 kg)  04/07/20 137 lb (62.1 kg)  03/26/20 139 lb 3.2 oz (63.1 kg)    Physical Exam Vitals and nursing note reviewed.  Constitutional:      General: She is not in acute distress.    Appearance: She is well-developed. She is not diaphoretic.  Eyes:     Conjunctiva/sclera: Conjunctivae normal.  Cardiovascular:     Rate and Rhythm: Normal rate and regular rhythm.     Heart sounds: Normal heart sounds. No murmur heard.   Pulmonary:     Effort: Pulmonary effort is normal. No respiratory distress.     Breath sounds: Normal breath sounds. No wheezing.  Musculoskeletal:        General: No tenderness. Normal range of motion.  Skin:    General: Skin is warm and dry.     Findings: No rash.  Neurological:     Mental Status: She is alert and oriented to person, place, and time.     Coordination: Coordination normal.  Psychiatric:        Behavior: Behavior normal.       Assessment & Plan:   Problem List Items Addressed This Visit      Cardiovascular and Mediastinum   Essential hypertension (Chronic)   Relevant Medications   metoprolol succinate (TOPROL-XL) 100 MG 24 hr tablet   amLODipine-atorvastatin (CADUET) 10-40 MG tablet   Other Relevant Orders   CBC with Differential/Platelet    CMP14+EGFR     Respiratory   Gastroesophageal reflux disease with hiatal hernia     Other   Dyslipidemia - Primary (Chronic)   Relevant Medications   amLODipine-atorvastatin (CADUET) 10-40 MG tablet   Other Relevant Orders   Lipid panel   Pre-diabetes   Relevant Orders   Lipid panel    Other Visit Diagnoses    Flu vaccine need       Relevant Orders   Flu Vaccine QUAD High Dose(Fluad) (Completed)      Will refill for new meter, no change in medication today. Follow up plan: Return in about 4 months (around 09/20/2020), or if symptoms worsen or fail to improve, for Follow-up prediabetes and hypertension and cholesterol.  Counseling provided for  all of the vaccine components Orders Placed This Encounter  Procedures  . Flu Vaccine QUAD High Dose(Fluad)  . CBC with Differential/Platelet  . CMP14+EGFR  . Lipid panel    Caryl Pina, MD [facility] 05/20/2020, 11:39 AM

## 2020-05-21 LAB — CMP14+EGFR
ALT: 28 IU/L (ref 0–32)
AST: 28 IU/L (ref 0–40)
Albumin/Globulin Ratio: 1.8 (ref 1.2–2.2)
Albumin: 4.6 g/dL (ref 3.6–4.6)
Alkaline Phosphatase: 116 IU/L (ref 44–121)
BUN/Creatinine Ratio: 35 — ABNORMAL HIGH (ref 12–28)
BUN: 16 mg/dL (ref 8–27)
Bilirubin Total: 0.3 mg/dL (ref 0.0–1.2)
CO2: 23 mmol/L (ref 20–29)
Calcium: 10.2 mg/dL (ref 8.7–10.3)
Chloride: 102 mmol/L (ref 96–106)
Creatinine, Ser: 0.46 mg/dL — ABNORMAL LOW (ref 0.57–1.00)
GFR calc Af Amer: 106 mL/min/{1.73_m2} (ref >59)
GFR calc non Af Amer: 92 mL/min/{1.73_m2} (ref >59)
Globulin, Total: 2.5 g/dL (ref 1.5–4.5)
Glucose: 100 mg/dL — ABNORMAL HIGH (ref 65–99)
Potassium: 4.3 mmol/L (ref 3.5–5.2)
Sodium: 140 mmol/L (ref 134–144)
Total Protein: 7.1 g/dL (ref 6.0–8.5)

## 2020-05-21 LAB — CBC WITH DIFFERENTIAL/PLATELET
Basophils Absolute: 0 10*3/uL (ref 0.0–0.2)
Basos: 1 %
EOS (ABSOLUTE): 0.2 10*3/uL (ref 0.0–0.4)
Eos: 2 %
Hematocrit: 38.2 % (ref 34.0–46.6)
Hemoglobin: 12.9 g/dL (ref 11.1–15.9)
Immature Grans (Abs): 0 10*3/uL (ref 0.0–0.1)
Immature Granulocytes: 0 %
Lymphocytes Absolute: 1.9 10*3/uL (ref 0.7–3.1)
Lymphs: 27 %
MCH: 31.4 pg (ref 26.6–33.0)
MCHC: 33.8 g/dL (ref 31.5–35.7)
MCV: 93 fL (ref 79–97)
Monocytes Absolute: 0.7 10*3/uL (ref 0.1–0.9)
Monocytes: 10 %
Neutrophils Absolute: 4.1 10*3/uL (ref 1.4–7.0)
Neutrophils: 60 %
Platelets: 201 10*3/uL (ref 150–450)
RBC: 4.11 x10E6/uL (ref 3.77–5.28)
RDW: 12.9 % (ref 11.7–15.4)
WBC: 6.9 10*3/uL (ref 3.4–10.8)

## 2020-05-21 LAB — LIPID PANEL
Chol/HDL Ratio: 4 ratio (ref 0.0–4.4)
Cholesterol, Total: 147 mg/dL (ref 100–199)
HDL: 37 mg/dL — ABNORMAL LOW (ref >39)
LDL Chol Calc (NIH): 62 mg/dL (ref 0–99)
Triglycerides: 302 mg/dL — ABNORMAL HIGH (ref 0–149)
VLDL Cholesterol Cal: 48 mg/dL — ABNORMAL HIGH (ref 5–40)

## 2020-05-25 ENCOUNTER — Other Ambulatory Visit: Payer: Self-pay | Admitting: Family Medicine

## 2020-05-25 DIAGNOSIS — I10 Essential (primary) hypertension: Secondary | ICD-10-CM

## 2020-05-26 ENCOUNTER — Ambulatory Visit: Payer: Medicare Other | Admitting: Family Medicine

## 2020-05-26 ENCOUNTER — Other Ambulatory Visit: Payer: Self-pay

## 2020-05-26 DIAGNOSIS — R7303 Prediabetes: Secondary | ICD-10-CM

## 2020-05-26 MED ORDER — ACCU-CHEK AVIVA PLUS W/DEVICE KIT: 1.0000 [IU] | PACK | Freq: Every day | 0 refills | Status: DC

## 2020-06-01 ENCOUNTER — Telehealth: Payer: Self-pay

## 2020-06-01 DIAGNOSIS — R7303 Prediabetes: Secondary | ICD-10-CM

## 2020-06-02 NOTE — Telephone Encounter (Signed)
Waiting on provider to reply - will contact patient once we hear back.  Same message was sent yesterday.

## 2020-06-03 MED ORDER — ACCU-CHEK AVIVA PLUS W/DEVICE KIT: 1.0000 [IU] | PACK | Freq: Every day | 0 refills | Status: DC

## 2020-06-03 NOTE — Telephone Encounter (Signed)
Please let her know that I printed the prescription for her and she can pick it up anytime.

## 2020-06-03 NOTE — Telephone Encounter (Signed)
Patient aware and verbalizes understanding. 

## 2020-06-03 NOTE — Telephone Encounter (Signed)
Blood glucose monitoring kit faxed to [facility] in [place] today

## 2020-06-09 ENCOUNTER — Ambulatory Visit: Payer: Medicare Other | Admitting: Cardiology

## 2020-06-16 LAB — HM DIABETES EYE EXAM

## 2020-07-19 NOTE — Progress Notes (Signed)
Cardiology Office Note   Date:  07/21/2020   ID:  Shabnam Ladd, DOB 1934/05/01, MRN [id number]  PCP:  [name], [name], MD  Cardiologist:   Minus Breeding, MD   Chief Complaint  Patient presents with  . Palpitations      History of Present Illness: Melissa Carpenter is a 86 y.o. female who presents for follow up of palpitations and HTN.  At the last visit she had added a p.m. dose of hydralazine to her regimen.  She has done well with this.  Her blood pressure seems to be much better and she brought up blood pressure diary.  She tolerated the increased med.  She has had no dizziness presyncope or syncope.  She had no shortness of breath, PND or orthopnea.  Said no weight gain or edema.   Past Medical History:  Diagnosis Date  . Allergy   . Aortic insufficiency    Mild, echo, December, 2009  . Arthritis   . Carotid artery disease ([employer])   . Cataract   . Cellulitis   . Chest pain    Catheterization, 2003, no significant CAD  . Diastolic dysfunction    Mild diastolic dysfunction, echo, December, 2012  . DJD (degenerative joint disease)   . Dyslipidemia   . Edema    November, 2012  . GERD (gastroesophageal reflux disease)   . Hyperlipidemia   . Hypertension   . Mitral valve prolapse    Echo, 2009, very mild intermittent prolapse of the posterior leaflet, no MR  . Osteoporosis   . Prediabetes   . Tachycardia    Nighttime tachycardia, February, 2014  . Thyroid nodule     Past Surgical History:  Procedure Laterality Date  . ABDOMINAL HYSTERECTOMY  1964  . APPENDECTOMY  1946  . BIOPSY THYROID Left   . CARDIAC CATHETERIZATION  sept 2003   no significant cad  . CATARACT EXTRACTION, BILATERAL    . COLONOSCOPY    . NASAL SINUS SURGERY    . TONSILLECTOMY       Current Outpatient Medications  Medication Sig Dispense Refill  . Accu-Chek Softclix Lancets lancets Use to check blood sugars daily 100 each 3  . amLODipine-atorvastatin (CADUET) 10-40 MG tablet Take 1 tablet  by mouth daily. 90 tablet 3  . aspirin 81 MG tablet Take 81 mg by mouth daily.    . Blood Glucose Monitoring Suppl (ACCU-CHEK AVIVA PLUS) w/Device KIT 1 Units by Does not apply route daily. 1 kit 0  . Calcium Carbonate-Vitamin D (CALTRATE 600+D) 600-400 MG-UNIT per tablet Take 1 tablet by mouth daily.    . cholecalciferol (VITAMIN D) 400 UNITS TABS Take 1,000 Units by mouth.      . fish oil-omega-3 fatty acids 1000 MG capsule Take 1 g by mouth daily.      . fluticasone (FLONASE) 50 MCG/ACT nasal spray SPRAY 2 SPRAYS INTO EACH NOSTRIL EVERY DAY 48 g 0  . glucose blood (ACCU-CHEK AVIVA PLUS) test strip Use to check blood sugars daily 100 each 3  . metoprolol succinate (TOPROL-XL) 100 MG 24 hr tablet Take 1 tablet (100 mg total) by mouth daily. Take with or immediately following a meal. 90 tablet 3  . Multiple Vitamin (MULTIVITAMIN) tablet Take 1 tablet by mouth daily.      Marland Kitchen telmisartan (MICARDIS) 80 MG tablet Take 1 tablet (80 mg total) by mouth every evening. 90 tablet 3  . triamcinolone cream (KENALOG) 0.1 % Apply 1 application topically 2 (two) times daily.  80 g 3  . hydrALAZINE (APRESOLINE) 25 MG tablet Take 1 tablet (25 mg total) by mouth every evening. TAKE WITH EVENING MEDICATIONS 90 tablet 3   No current facility-administered medications for this visit.    Allergies:   Codeine, Forteo [parathyroid hormone (recomb)], Raloxifene, Morphine, Penicillins, Sulfites, and Sulfonamide derivatives    ROS:  Please see the history of present illness.   Otherwise, review of systems are positive for none.  All other systems are reviewed and negative.    PHYSICAL EXAM: VS:  BP 140/61   Pulse 72   Ht '5\' 1"'  (1.549 m)   Wt 138 lb (62.6 kg)   SpO2 92%   BMI 26.07 kg/m  , BMI Body mass index is 26.07 kg/m.  GENERAL:  Well appearing NECK:  No jugular venous distention, waveform within normal limits, carotid upstroke brisk and symmetric, no bruits, no thyromegaly LUNGS:  Clear to auscultation  bilaterally CHEST:  Unremarkable HEART:  PMI not displaced or sustained,S1 and S2 within normal limits, no S3, no S4, no clicks, no rubs, 3 out of 6 apical systolic murmur radiating slightly at aortic outflow tract, no diastolic murmurs.   ABD:  Flat, positive bowel sounds normal in frequency in pitch, no bruits, no rebound, no guarding, no midline pulsatile mass, no hepatomegaly, no splenomegaly EXT:  2 plus pulses throughout, no edema, no cyanosis no clubbing   EKG:  EKG is not ordered today    Recent Labs: 04/07/2020: TSH 1.480 05/20/2020: ALT 28; BUN 16; Creatinine, Ser 0.46; Hemoglobin 12.9; Platelets 201; Potassium 4.3; Sodium 140    Lipid Panel    Component Value Date/Time   CHOL 147 05/20/2020 1146   CHOL 165 01/13/2013 0833   TRIG 302 (H) 05/20/2020 1146   TRIG 198 (H) 03/01/2017 1706   TRIG 240 (H) 01/13/2013 0833   HDL 37 (L) 05/20/2020 1146   HDL 38 (L) 03/01/2017 1706   HDL 39 (L) 01/13/2013 0833   CHOLHDL 4.0 05/20/2020 1146   LDLCALC 62 05/20/2020 1146   LDLCALC 83 03/18/2014 0819   LDLCALC 78 01/13/2013 0833      Wt Readings from Last 3 Encounters:  07/21/20 138 lb (62.6 kg)  05/20/20 138 lb (62.6 kg)  04/07/20 137 lb (62.1 kg)      Other studies Reviewed: Additional studies/ records that were reviewed today include:  None Review of the above records demonstrates:  None   ASSESSMENT AND PLAN:  HTN:   Her blood pressures is controlled.  She will continue the meds as listed.   PALPITATIONS:     She is having these occasionally.  No change in therapy.  They are not particularly symptomatic.   CAROTID STENOSIS:   This was mild in April.  No change in therapy.  AS:   This was moderate on echo in May.  I will repeat an echo in May.  She had some very mild MR as well.   Current medicines are reviewed at length with the patient today.  The patient does not have concerns regarding medicines.  The following changes have been made:   None  Labs/ tests  ordered today include:   None  Orders Placed This Encounter  Procedures  . ECHOCARDIOGRAM COMPLETE     Disposition:   FU with me in  in 12 months. Ronnell Guadalajara, MD  07/21/2020 9:37 AM    [place]

## 2020-07-21 ENCOUNTER — Encounter: Payer: Self-pay | Admitting: Cardiology

## 2020-07-21 ENCOUNTER — Other Ambulatory Visit: Payer: Self-pay

## 2020-07-21 ENCOUNTER — Ambulatory Visit (INDEPENDENT_AMBULATORY_CARE_PROVIDER_SITE_OTHER): Payer: Medicare Other | Admitting: Cardiology

## 2020-07-21 VITALS — BP 140/61 | HR 72 | Ht 61.0 in | Wt 138.0 lb

## 2020-07-21 DIAGNOSIS — I6523 Occlusion and stenosis of bilateral carotid arteries: Secondary | ICD-10-CM | POA: Diagnosis not present

## 2020-07-21 DIAGNOSIS — R002 Palpitations: Secondary | ICD-10-CM

## 2020-07-21 DIAGNOSIS — I350 Nonrheumatic aortic (valve) stenosis: Secondary | ICD-10-CM | POA: Diagnosis not present

## 2020-07-21 DIAGNOSIS — I6529 Occlusion and stenosis of unspecified carotid artery: Secondary | ICD-10-CM | POA: Diagnosis not present

## 2020-07-21 DIAGNOSIS — I10 Essential (primary) hypertension: Secondary | ICD-10-CM | POA: Diagnosis not present

## 2020-07-21 NOTE — Patient Instructions (Signed)
Medication Instructions:  The current medical regimen is effective;  continue present plan and medications.  *If you need a refill on your cardiac medications before your next appointment, please call your pharmacy*  Testing/Procedures: Your physician has requested that you have an echocardiogram in May 2022. Echocardiography is a painless test that uses sound waves to create images of your heart. It provides your doctor with information about the size and shape of your heart and how well your heart's chambers and valves are working. This procedure takes approximately one hour. There are no restrictions for this procedure.  Follow-Up: At [facility], you and your health needs are our priority.  As part of our continuing mission to provide you with exceptional heart care, we have created designated Provider Care Teams.  These Care Teams include your primary Cardiologist (physician) and Advanced Practice Providers (APPs -  Physician Assistants and Nurse Practitioners) who all work together to provide you with the care you need, when you need it.  We recommend signing up for the patient portal called "MyChart".  Sign up information is provided on this After Visit Summary.  MyChart is used to connect with patients for Virtual Visits (Telemedicine).  Patients are able to view lab/test results, encounter notes, upcoming appointments, etc.  Non-urgent messages can be sent to your provider as well.   To learn more about what you can do with MyChart, go to [email or web address].    Your next appointment:   12 month(s)  The format for your next appointment:   In Person  Provider:   Minus Breeding, MD   Thank you for choosing [facility]!!

## 2020-07-26 ENCOUNTER — Other Ambulatory Visit: Payer: Self-pay | Admitting: Family Medicine

## 2020-07-26 DIAGNOSIS — I10 Essential (primary) hypertension: Secondary | ICD-10-CM

## 2020-07-29 ENCOUNTER — Ambulatory Visit: Payer: Medicare Other | Admitting: Cardiology

## 2020-08-26 DIAGNOSIS — M545 Low back pain, unspecified: Secondary | ICD-10-CM | POA: Diagnosis not present

## 2020-08-26 DIAGNOSIS — M1611 Unilateral primary osteoarthritis, right hip: Secondary | ICD-10-CM | POA: Diagnosis not present

## 2020-08-26 DIAGNOSIS — M47816 Spondylosis without myelopathy or radiculopathy, lumbar region: Secondary | ICD-10-CM | POA: Diagnosis not present

## 2020-08-26 DIAGNOSIS — M25551 Pain in right hip: Secondary | ICD-10-CM | POA: Diagnosis not present

## 2020-09-08 DIAGNOSIS — M25551 Pain in right hip: Secondary | ICD-10-CM | POA: Diagnosis not present

## 2020-09-15 ENCOUNTER — Other Ambulatory Visit: Payer: Self-pay

## 2020-09-15 ENCOUNTER — Other Ambulatory Visit: Payer: Medicare Other

## 2020-09-15 DIAGNOSIS — R7303 Prediabetes: Secondary | ICD-10-CM

## 2020-09-15 DIAGNOSIS — I10 Essential (primary) hypertension: Secondary | ICD-10-CM

## 2020-09-15 DIAGNOSIS — E785 Hyperlipidemia, unspecified: Secondary | ICD-10-CM | POA: Diagnosis not present

## 2020-09-15 LAB — CBC WITH DIFFERENTIAL/PLATELET
Basophils Absolute: 0 10*3/uL (ref 0.0–0.2)
Basos: 0 %
EOS (ABSOLUTE): 0.1 10*3/uL (ref 0.0–0.4)
Eos: 1 %
Hematocrit: 40.2 % (ref 34.0–46.6)
Hemoglobin: 13.5 g/dL (ref 11.1–15.9)
Immature Grans (Abs): 0 10*3/uL (ref 0.0–0.1)
Immature Granulocytes: 0 %
Lymphocytes Absolute: 1.6 10*3/uL (ref 0.7–3.1)
Lymphs: 23 %
MCH: 31 pg (ref 26.6–33.0)
MCHC: 33.6 g/dL (ref 31.5–35.7)
MCV: 92 fL (ref 79–97)
Monocytes Absolute: 0.7 10*3/uL (ref 0.1–0.9)
Monocytes: 10 %
Neutrophils Absolute: 4.6 10*3/uL (ref 1.4–7.0)
Neutrophils: 66 %
Platelets: 240 10*3/uL (ref 150–450)
RBC: 4.36 x10E6/uL (ref 3.77–5.28)
RDW: 13.1 % (ref 11.7–15.4)
WBC: 7.1 10*3/uL (ref 3.4–10.8)

## 2020-09-15 LAB — CMP14+EGFR
ALT: 24 IU/L (ref 0–32)
AST: 26 IU/L (ref 0–40)
Albumin/Globulin Ratio: 1.7 (ref 1.2–2.2)
Albumin: 4.6 g/dL (ref 3.6–4.6)
Alkaline Phosphatase: 108 IU/L (ref 44–121)
BUN/Creatinine Ratio: 25 (ref 12–28)
BUN: 18 mg/dL (ref 8–27)
Bilirubin Total: 0.4 mg/dL (ref 0.0–1.2)
CO2: 23 mmol/L (ref 20–29)
Calcium: 9.7 mg/dL (ref 8.7–10.3)
Chloride: 102 mmol/L (ref 96–106)
Creatinine, Ser: 0.72 mg/dL (ref 0.57–1.00)
GFR calc Af Amer: 89 mL/min/{1.73_m2} (ref >59)
GFR calc non Af Amer: 77 mL/min/{1.73_m2} (ref >59)
Globulin, Total: 2.7 g/dL (ref 1.5–4.5)
Glucose: 90 mg/dL (ref 65–99)
Potassium: 4.7 mmol/L (ref 3.5–5.2)
Sodium: 140 mmol/L (ref 134–144)
Total Protein: 7.3 g/dL (ref 6.0–8.5)

## 2020-09-15 LAB — BAYER DCA HB A1C WAIVED: HB A1C (BAYER DCA - WAIVED): 6.1 % (ref <7.0)

## 2020-09-15 LAB — LIPID PANEL
Chol/HDL Ratio: 3.2 ratio (ref 0.0–4.4)
Cholesterol, Total: 173 mg/dL (ref 100–199)
HDL: 54 mg/dL (ref >39)
LDL Chol Calc (NIH): 96 mg/dL (ref 0–99)
Triglycerides: 128 mg/dL (ref 0–149)
VLDL Cholesterol Cal: 23 mg/dL (ref 5–40)

## 2020-09-20 ENCOUNTER — Ambulatory Visit (INDEPENDENT_AMBULATORY_CARE_PROVIDER_SITE_OTHER): Payer: Medicare Other | Admitting: Family Medicine

## 2020-09-20 ENCOUNTER — Encounter: Payer: Self-pay | Admitting: Family Medicine

## 2020-09-20 ENCOUNTER — Other Ambulatory Visit: Payer: Self-pay

## 2020-09-20 VITALS — BP 120/64 | HR 60 | Temp 98.6°F | Resp 20 | Ht 61.0 in | Wt 137.0 lb

## 2020-09-20 DIAGNOSIS — R7303 Prediabetes: Secondary | ICD-10-CM

## 2020-09-20 DIAGNOSIS — I10 Essential (primary) hypertension: Secondary | ICD-10-CM

## 2020-09-20 DIAGNOSIS — K219 Gastro-esophageal reflux disease without esophagitis: Secondary | ICD-10-CM | POA: Diagnosis not present

## 2020-09-20 DIAGNOSIS — E785 Hyperlipidemia, unspecified: Secondary | ICD-10-CM

## 2020-09-20 DIAGNOSIS — K449 Diaphragmatic hernia without obstruction or gangrene: Secondary | ICD-10-CM

## 2020-09-20 MED ORDER — ACCU-CHEK SOFTCLIX LANCETS MISC: 3 refills | Status: DC

## 2020-09-20 MED ORDER — GLUCOSE BLOOD VI STRP: ORAL_STRIP | 12 refills | Status: DC

## 2020-09-20 NOTE — Progress Notes (Signed)
BP 120/64   Pulse 60   Temp 98.6 F (37 C)   Resp 20   Ht 5\' 1"  (1.549 m)   Wt 137 lb (62.1 kg)   SpO2 99%   BMI 25.89 kg/m    Subjective:   Patient ID: Melissa Carpenter, female    DOB: [birth year], 87 y.o.   MRN: [id number]  HPI: Melissa Carpenter is a 87 y.o. female presenting on 09/20/2020 for Medical Management of Chronic Issues, Hypertension, and Hyperlipidemia (4 mo )   HPI  Hypertension Patient is currently on amlodipine and hydralazine and metoprolol and telmisartan, and their blood pressure today is 120/64. Patient denies any lightheadedness or dizziness. Patient denies headaches, blurred vision, chest pains, shortness of breath, or weakness. Denies any side effects from medication and is content with current medication.   Hyperlipidemia Patient is coming in for recheck of his hyperlipidemia. The patient is currently taking fish oil. They deny any issues with myalgias or history of liver damage from it. They deny any focal numbness or weakness or chest pain.   Prediabetes Patient comes in today for recheck of his diabetes. Patient has been currently taking no medication currently, A1c is 6.1. Patient is currently on an ACE inhibitor/ARB. Patient has not seen an ophthalmologist this year. Patient denies any issues with their feet. The symptom started onset as an adult hyperlipidemia and hypertension ARE RELATED TO DM   GERD Patient is currently on no medication currently for GERD.  She denies any major symptoms or abdominal pain or belching or burping. She denies any blood in her stool or lightheadedness or dizziness.   Relevant past medical, surgical, family and social history reviewed and updated as indicated. Interim medical history since our last visit reviewed. Allergies and medications reviewed and updated.  Review of Systems  Constitutional: Negative for chills and fever.  Eyes: Negative for visual disturbance.  Respiratory: Negative for chest tightness and shortness of breath.    Cardiovascular: Negative for chest pain and leg swelling.  Musculoskeletal: Negative for back pain and gait problem.  Skin: Negative for rash.  Neurological: Negative for light-headedness and headaches.  Psychiatric/Behavioral: Negative for agitation and behavioral problems.  All other systems reviewed and are negative.   Per HPI unless specifically indicated above   Allergies as of 09/20/2020      Reactions   Codeine Hives   Forteo [parathyroid Hormone (recomb)] Other (See Comments)   Weakness and calcium increase   Raloxifene Other (See Comments)   Eye problems Other reaction(s): Other (See Comments) Eye problems Eye problems   Morphine Rash   Penicillins Rash   Sulfites Rash   Sulfonamide Derivatives Rash      Medication List       Accurate as of September 20, 2020 10:51 AM. If you have any questions, ask your nurse or doctor.        STOP taking these medications   Accu-Chek Aviva Plus w/Device Kit Stopped by: [name] Sefora Tietje, MD     TAKE these medications   Accu-Chek Softclix Lancets lancets Use to check blood sugars daily   amLODipine-atorvastatin 10-40 MG tablet Commonly known as: CADUET Take 1 tablet by mouth daily.   aspirin 81 MG tablet Take 81 mg by mouth daily.   Calcium Carbonate-Vitamin D 600-400 MG-UNIT tablet Commonly known as: Caltrate 600+D Take 1 tablet by mouth daily.   cholecalciferol 10 MCG (400 UNIT) Tabs tablet Commonly known as: VITAMIN D3 Take 1,000 Units by mouth.   fish oil-omega-3  fatty acids 1000 MG capsule Take 1 g by mouth daily.   fluticasone 50 MCG/ACT nasal spray Commonly known as: FLONASE SPRAY 2 SPRAYS INTO EACH NOSTRIL EVERY DAY   glucose blood test strip Check blood glucose QD and PRN [id number] What changed: additional instructions Changed by: [name] Cheyene Hamric, MD   hydrALAZINE 25 MG tablet Commonly known as: APRESOLINE Take 1 tablet (25 mg total) by mouth every evening. TAKE WITH EVENING MEDICATIONS    metoprolol succinate 100 MG 24 hr tablet Commonly known as: TOPROL-XL Take 1 tablet (100 mg total) by mouth daily. Take with or immediately following a meal.   multivitamin tablet Take 1 tablet by mouth daily.   telmisartan 80 MG tablet Commonly known as: Micardis Take 1 tablet (80 mg total) by mouth every evening.   triamcinolone 0.1 % Commonly known as: KENALOG Apply 1 application topically 2 (two) times daily.        Objective:   BP 120/64   Pulse 60   Temp 98.6 F (37 C)   Resp 20   Ht 5\' 1"  (1.549 m)   Wt 137 lb (62.1 kg)   SpO2 99%   BMI 25.89 kg/m   Wt Readings from Last 3 Encounters:  09/20/20 137 lb (62.1 kg)  07/21/20 138 lb (62.6 kg)  05/20/20 138 lb (62.6 kg)    Physical Exam Vitals and nursing note reviewed.  Constitutional:      General: She is not in acute distress.    Appearance: She is well-developed and well-nourished. She is not diaphoretic.  Eyes:     Extraocular Movements: EOM normal.     Conjunctiva/sclera: Conjunctivae normal.  Cardiovascular:     Rate and Rhythm: Normal rate and regular rhythm.     Pulses: Intact distal pulses.     Heart sounds: Murmur (systolic cresc decresc) heard.    Pulmonary:     Effort: Pulmonary effort is normal. No respiratory distress.     Breath sounds: Normal breath sounds. No wheezing.  Musculoskeletal:        General: No tenderness or edema. Normal range of motion.  Skin:    General: Skin is warm and dry.     Findings: No rash.  Neurological:     Mental Status: She is alert and oriented to person, place, and time.     Coordination: Coordination normal.  Psychiatric:        Mood and Affect: Mood and affect normal.        Behavior: Behavior normal.     Results for orders placed or performed in visit on 09/15/20  Bayer DCA Hb A1c Waived  Result Value Ref Range   HB A1C (BAYER DCA - WAIVED) 6.1 <7.0 %  Lipid panel  Result Value Ref Range   Cholesterol, Total 173 100 - 199 mg/dL    Triglycerides 213 0 - 149 mg/dL   HDL 54 >08 mg/dL   VLDL Cholesterol Cal 23 5 - 40 mg/dL   LDL Chol Calc (NIH) 96 0 - 99 mg/dL   Chol/HDL Ratio 3.2 0.0 - 4.4 ratio  CMP14+EGFR  Result Value Ref Range   Glucose 90 65 - 99 mg/dL   BUN 18 8 - 27 mg/dL   Creatinine, Ser 6.57 0.57 - 1.00 mg/dL   GFR calc non Af Amer 77 >59 mL/min/1.73   GFR calc Af Amer 89 >59 mL/min/1.73   BUN/Creatinine Ratio 25 12 - 28   Sodium 140 134 - 144 mmol/L   Potassium 4.7  3.5 - 5.2 mmol/L   Chloride 102 96 - 106 mmol/L   CO2 23 20 - 29 mmol/L   Calcium 9.7 8.7 - 10.3 mg/dL   Total Protein 7.3 6.0 - 8.5 g/dL   Albumin 4.6 3.6 - 4.6 g/dL   Globulin, Total 2.7 1.5 - 4.5 g/dL   Albumin/Globulin Ratio 1.7 1.2 - 2.2   Bilirubin Total 0.4 0.0 - 1.2 mg/dL   Alkaline Phosphatase 108 44 - 121 IU/L   AST 26 0 - 40 IU/L   ALT 24 0 - 32 IU/L  CBC with Differential/Platelet  Result Value Ref Range   WBC 7.1 3.4 - 10.8 x10E3/uL   RBC 4.36 3.77 - 5.28 x10E6/uL   Hemoglobin 13.5 11.1 - 15.9 g/dL   Hematocrit 16.1 09.6 - 46.6 %   MCV 92 79 - 97 fL   MCH 31.0 26.6 - 33.0 pg   MCHC 33.6 31.5 - 35.7 g/dL   RDW 04.5 40.9 - 81.1 %   Platelets 240 150 - 450 x10E3/uL   Neutrophils 66 Not Estab. %   Lymphs 23 Not Estab. %   Monocytes 10 Not Estab. %   Eos 1 Not Estab. %   Basos 0 Not Estab. %   Neutrophils Absolute 4.6 1.4 - 7.0 x10E3/uL   Lymphocytes Absolute 1.6 0.7 - 3.1 x10E3/uL   Monocytes Absolute 0.7 0.1 - 0.9 x10E3/uL   EOS (ABSOLUTE) 0.1 0.0 - 0.4 x10E3/uL   Basophils Absolute 0.0 0.0 - 0.2 x10E3/uL   Immature Granulocytes 0 Not Estab. %   Immature Grans (Abs) 0.0 0.0 - 0.1 x10E3/uL    Assessment & Plan:   Problem List Items Addressed This Visit      Cardiovascular and Mediastinum   Essential hypertension - Primary (Chronic)     Respiratory   Gastroesophageal reflux disease with hiatal hernia     Other   Dyslipidemia (Chronic)   Pre-diabetes   Relevant Medications   Accu-Chek Softclix Lancets  lancets      Continue current medication, no changes Follow up plan: Return in about 4 months (around 01/18/2021), or if symptoms worsen or fail to improve, for Prediabetes hypertension, hld.  Counseling provided for all of the vaccine components No orders of the defined types were placed in this encounter.   Arville Care, MD [facility] 09/20/2020, 10:51 AM

## 2020-10-05 ENCOUNTER — Other Ambulatory Visit: Payer: Self-pay | Admitting: Cardiology

## 2020-12-20 ENCOUNTER — Ambulatory Visit (HOSPITAL_COMMUNITY): Payer: Medicare Other | Attending: Cardiology

## 2020-12-20 ENCOUNTER — Other Ambulatory Visit: Payer: Self-pay

## 2020-12-20 DIAGNOSIS — I350 Nonrheumatic aortic (valve) stenosis: Secondary | ICD-10-CM | POA: Insufficient documentation

## 2020-12-20 LAB — ECHOCARDIOGRAM COMPLETE
AR max vel: 1.47 cm2
AV Area VTI: 1.51 cm2
AV Area mean vel: 1.48 cm2
AV Mean grad: 21 mmHg
AV Peak grad: 36.2 mmHg
Ao pk vel: 3.01 m/s
Area-P 1/2: 3.11 cm2
S' Lateral: 1.8 cm

## 2020-12-30 ENCOUNTER — Other Ambulatory Visit: Payer: Self-pay | Admitting: Cardiology

## 2021-01-17 ENCOUNTER — Other Ambulatory Visit: Payer: Medicare Other

## 2021-01-17 DIAGNOSIS — R7303 Prediabetes: Secondary | ICD-10-CM | POA: Diagnosis not present

## 2021-01-17 DIAGNOSIS — E785 Hyperlipidemia, unspecified: Secondary | ICD-10-CM

## 2021-01-17 DIAGNOSIS — I10 Essential (primary) hypertension: Secondary | ICD-10-CM | POA: Diagnosis not present

## 2021-01-17 LAB — BAYER DCA HB A1C WAIVED: HB A1C (BAYER DCA - WAIVED): 5.7 % (ref <7.0)

## 2021-01-18 LAB — CBC WITH DIFFERENTIAL/PLATELET
Basophils Absolute: 0 10*3/uL (ref 0.0–0.2)
Basos: 1 %
EOS (ABSOLUTE): 0.1 10*3/uL (ref 0.0–0.4)
Eos: 3 %
Hematocrit: 38.9 % (ref 34.0–46.6)
Hemoglobin: 13 g/dL (ref 11.1–15.9)
Immature Grans (Abs): 0 10*3/uL (ref 0.0–0.1)
Immature Granulocytes: 0 %
Lymphocytes Absolute: 1.2 10*3/uL (ref 0.7–3.1)
Lymphs: 26 %
MCH: 31.3 pg (ref 26.6–33.0)
MCHC: 33.4 g/dL (ref 31.5–35.7)
MCV: 94 fL (ref 79–97)
Monocytes Absolute: 0.5 10*3/uL (ref 0.1–0.9)
Monocytes: 11 %
Neutrophils Absolute: 2.9 10*3/uL (ref 1.4–7.0)
Neutrophils: 59 %
Platelets: 191 10*3/uL (ref 150–450)
RBC: 4.15 x10E6/uL (ref 3.77–5.28)
RDW: 12.3 % (ref 11.7–15.4)
WBC: 4.8 10*3/uL (ref 3.4–10.8)

## 2021-01-18 LAB — THYROID PANEL WITH TSH
Free Thyroxine Index: 2.5 (ref 1.2–4.9)
T3 Uptake Ratio: 28 % (ref 24–39)
T4, Total: 8.8 ug/dL (ref 4.5–12.0)
TSH: 1.6 u[IU]/mL (ref 0.450–4.500)

## 2021-01-18 LAB — CMP14+EGFR
ALT: 38 IU/L — ABNORMAL HIGH (ref 0–32)
AST: 38 IU/L (ref 0–40)
Albumin/Globulin Ratio: 1.7 (ref 1.2–2.2)
Albumin: 4.5 g/dL (ref 3.6–4.6)
Alkaline Phosphatase: 109 IU/L (ref 44–121)
BUN/Creatinine Ratio: 22 (ref 12–28)
BUN: 13 mg/dL (ref 8–27)
Bilirubin Total: 0.3 mg/dL (ref 0.0–1.2)
CO2: 19 mmol/L — ABNORMAL LOW (ref 20–29)
Calcium: 8.9 mg/dL (ref 8.7–10.3)
Chloride: 106 mmol/L (ref 96–106)
Creatinine, Ser: 0.59 mg/dL (ref 0.57–1.00)
Globulin, Total: 2.7 g/dL (ref 1.5–4.5)
Glucose: 107 mg/dL — ABNORMAL HIGH (ref 65–99)
Potassium: 4.1 mmol/L (ref 3.5–5.2)
Sodium: 141 mmol/L (ref 134–144)
Total Protein: 7.2 g/dL (ref 6.0–8.5)
eGFR: 89 mL/min/{1.73_m2} (ref >59)

## 2021-01-18 LAB — LIPID PANEL
Chol/HDL Ratio: 3.1 ratio (ref 0.0–4.4)
Cholesterol, Total: 134 mg/dL (ref 100–199)
HDL: 43 mg/dL (ref >39)
LDL Chol Calc (NIH): 69 mg/dL (ref 0–99)
Triglycerides: 121 mg/dL (ref 0–149)
VLDL Cholesterol Cal: 22 mg/dL (ref 5–40)

## 2021-01-19 ENCOUNTER — Other Ambulatory Visit: Payer: Self-pay

## 2021-01-19 ENCOUNTER — Encounter: Payer: Self-pay | Admitting: Family Medicine

## 2021-01-19 ENCOUNTER — Ambulatory Visit (INDEPENDENT_AMBULATORY_CARE_PROVIDER_SITE_OTHER): Payer: Medicare Other | Admitting: Family Medicine

## 2021-01-19 VITALS — Ht 61.0 in

## 2021-01-19 DIAGNOSIS — E785 Hyperlipidemia, unspecified: Secondary | ICD-10-CM | POA: Diagnosis not present

## 2021-01-19 DIAGNOSIS — R7303 Prediabetes: Secondary | ICD-10-CM

## 2021-01-19 DIAGNOSIS — K5909 Other constipation: Secondary | ICD-10-CM

## 2021-01-19 DIAGNOSIS — I10 Essential (primary) hypertension: Secondary | ICD-10-CM

## 2021-01-19 DIAGNOSIS — R350 Frequency of micturition: Secondary | ICD-10-CM

## 2021-01-19 DIAGNOSIS — Z1231 Encounter for screening mammogram for malignant neoplasm of breast: Secondary | ICD-10-CM

## 2021-01-19 NOTE — Progress Notes (Signed)
Ht _0  (1.549 m)   BMI 25.89 kg/m    Subjective:   Patient ID: Melissa Carpenter, female    DOB: 1934/04/28, 87 y.o.   MRN: [id number]  HPI: [name] is a 87 y.o. female presenting on 01/19/2021 for Medical Management of Chronic Issues, Hyperlipidemia, and Hypertension   HPI Constipation Patient is coming with complaints of constipation that she has been dealing with chronically off and on.  She tried softeners and MiraLAX and with the MiraLAX eventually after for 5 days she will have a bowel movement that she stops it gets right back to where it was.  Patient denies any significant abdominal pain but she says she also does have where she feels like she has a lot of pressure down her bladder and vaginal region and pelvic region especially with the constipation.  Prediabetes Patient comes in today for recheck of his diabetes. Patient has been currently taking no medication currently, A1c is 5.7. Patient is currently on an ACE inhibitor/ARB. Patient has not seen an ophthalmologist this year. Patient denies any issues with their feet. The symptom started onset as an adult hypertension and hyperlipidemia ARE RELATED TO DM   Hypertension Patient is currently on metoprolol and hydralazine and telmisartan and amlodipine, and their blood pressure today is 138/60. Patient denies any lightheadedness or dizziness. Patient denies headaches, blurred vision, chest pains, shortness of breath, or weakness. Denies any side effects from medication and is content with current medication.   Hyperlipidemia Patient is coming in for recheck of his hyperlipidemia. The patient is currently taking fish oil and atorvastatin. They deny any issues with myalgias or history of liver damage from it. They deny any focal numbness or weakness or chest pain.   Relevant past medical, surgical, family and social history reviewed and updated as indicated. Interim medical history since our last visit reviewed. Allergies and  medications reviewed and updated.  Review of Systems  Constitutional: Negative for chills and fever.  Eyes: Negative for visual disturbance.  Respiratory: Negative for chest tightness and shortness of breath.   Cardiovascular: Negative for chest pain and leg swelling.  Gastrointestinal: Positive for constipation. Negative for abdominal distention, abdominal pain and blood in stool.  Genitourinary: Positive for frequency. Negative for difficulty urinating, dysuria, vaginal bleeding, vaginal discharge and vaginal pain.  Musculoskeletal: Negative for back pain and gait problem.  Skin: Negative for rash.  Neurological: Negative for light-headedness and headaches.  Psychiatric/Behavioral: Negative for agitation and behavioral problems.  All other systems reviewed and are negative.   Per HPI unless specifically indicated above   Allergies as of 01/19/2021      Reactions   Codeine Hives   Forteo [parathyroid Hormone (recomb)] Other (See Comments)   Weakness and calcium increase   Raloxifene Other (See Comments)   Eye problems Other reaction(s): Other (See Comments) Eye problems Eye problems   Morphine Rash   Penicillins Rash   Sulfites Rash   Sulfonamide Derivatives Rash      Medication List       Accurate as of January 19, 2021  9:28 AM. If you have any questions, ask your nurse or doctor.        Accu-Chek Softclix Lancets lancets Use to check blood sugars daily   amLODipine-atorvastatin 10-40 MG tablet Commonly known as: CADUET Take 1 tablet by mouth daily.   aspirin 81 MG tablet Take 81 mg by mouth daily.   Calcium Carbonate-Vitamin D 600-400 MG-UNIT tablet Commonly known as: Caltrate 600+D Take 1  tablet by mouth daily.   cholecalciferol 10 MCG (400 UNIT) Tabs tablet Commonly known as: VITAMIN D3 Take 1,000 Units by mouth.   fish oil-omega-3 fatty acids 1000 MG capsule Take 1 g by mouth daily.   fluticasone 50 MCG/ACT nasal spray Commonly known as: FLONASE SPRAY  2 SPRAYS INTO EACH NOSTRIL EVERY DAY   glucose blood test strip Check blood glucose QD and PRN DxE11.9   hydrALAZINE 25 MG tablet Commonly known as: APRESOLINE TAKE 1 TABLET (25 MG TOTAL) BY MOUTH EVERY EVENING. TAKE WITH EVENING MEDICATIONS   metoprolol succinate 100 MG 24 hr tablet Commonly known as: TOPROL-XL Take 1 tablet (100 mg total) by mouth daily. Take with or immediately following a meal.   multivitamin tablet Take 1 tablet by mouth daily.   telmisartan 80 MG tablet Commonly known as: MICARDIS TAKE 1 TABLET BY MOUTH EVERY EVENING   triamcinolone cream 0.1 % Commonly known as: KENALOG Apply 1 application topically 2 (two) times daily.        Objective:   Ht _0  (1.549 m)   BMI 25.89 kg/m   Wt Readings from Last 3 Encounters:  09/20/20 137 lb (62.1 kg)  07/21/20 138 lb (62.6 kg)  05/20/20 138 lb (62.6 kg)    Physical Exam Vitals and nursing note reviewed.  Constitutional:      General: She is not in acute distress.    Appearance: She is well-developed. She is not diaphoretic.  Eyes:     Conjunctiva/sclera: Conjunctivae normal.  Cardiovascular:     Rate and Rhythm: Normal rate and regular rhythm.     Heart sounds: Murmur (3 out of 6 holosystolic murmur) heard.    Pulmonary:     Effort: Pulmonary effort is normal. No respiratory distress.     Breath sounds: Normal breath sounds. No wheezing.  Abdominal:     General: Abdomen is flat. Bowel sounds are normal. There is no distension.     Tenderness: There is no abdominal tenderness. There is no guarding or rebound.  Musculoskeletal:        General: No tenderness. Normal range of motion.  Skin:    General: Skin is warm and dry.     Findings: No rash.  Neurological:     Mental Status: She is alert and oriented to person, place, and time.     Coordination: Coordination normal.  Psychiatric:        Behavior: Behavior normal.     Results for orders placed or performed in visit on 01/17/21  CBC  with Differential/Platelet  Result Value Ref Range   WBC 4.8 3.4 - 10.8 x10E3/uL   RBC 4.15 3.77 - 5.28 x10E6/uL   Hemoglobin 13.0 11.1 - 15.9 g/dL   Hematocrit 38.9 34.0 - 46.6 %   MCV 94 79 - 97 fL   MCH 31.3 26.6 - 33.0 pg   MCHC 33.4 31.5 - 35.7 g/dL   RDW 12.3 11.7 - 15.4 %   Platelets 191 150 - 450 x10E3/uL   Neutrophils 59 Not Estab. %   Lymphs 26 Not Estab. %   Monocytes 11 Not Estab. %   Eos 3 Not Estab. %   Basos 1 Not Estab. %   Neutrophils Absolute 2.9 1.4 - 7.0 x10E3/uL   Lymphocytes Absolute 1.2 0.7 - 3.1 x10E3/uL   Monocytes Absolute 0.5 0.1 - 0.9 x10E3/uL   EOS (ABSOLUTE) 0.1 0.0 - 0.4 x10E3/uL   Basophils Absolute 0.0 0.0 - 0.2 x10E3/uL   Immature Granulocytes 0  Not Estab. %   Immature Grans (Abs) 0.0 0.0 - 0.1 x10E3/uL  CMP14+EGFR  Result Value Ref Range   Glucose 107 (H) 65 - 99 mg/dL   BUN 13 8 - 27 mg/dL   Creatinine, Ser 0.59 0.57 - 1.00 mg/dL   eGFR 89 >59 mL/min/1.73   BUN/Creatinine Ratio 22 12 - 28   Sodium 141 134 - 144 mmol/L   Potassium 4.1 3.5 - 5.2 mmol/L   Chloride 106 96 - 106 mmol/L   CO2 19 (L) 20 - 29 mmol/L   Calcium 8.9 8.7 - 10.3 mg/dL   Total Protein 7.2 6.0 - 8.5 g/dL   Albumin 4.5 3.6 - 4.6 g/dL   Globulin, Total 2.7 1.5 - 4.5 g/dL   Albumin/Globulin Ratio 1.7 1.2 - 2.2   Bilirubin Total 0.3 0.0 - 1.2 mg/dL   Alkaline Phosphatase 109 44 - 121 IU/L   AST 38 0 - 40 IU/L   ALT 38 (H) 0 - 32 IU/L  Lipid panel  Result Value Ref Range   Cholesterol, Total 134 100 - 199 mg/dL   Triglycerides 121 0 - 149 mg/dL   HDL 43 >39 mg/dL   VLDL Cholesterol Cal 22 5 - 40 mg/dL   LDL Chol Calc (NIH) 69 0 - 99 mg/dL   Chol/HDL Ratio 3.1 0.0 - 4.4 ratio  Thyroid Panel With TSH  Result Value Ref Range   TSH 1.600 0.450 - 4.500 uIU/mL   T4, Total 8.8 4.5 - 12.0 ug/dL   T3 Uptake Ratio 28 24 - 39 %   Free Thyroxine Index 2.5 1.2 - 4.9  Bayer DCA Hb A1c Waived  Result Value Ref Range   HB A1C (BAYER DCA - WAIVED) 5.7 <7.0 %    Assessment  & Plan:   Problem List Items Addressed This Visit      Cardiovascular and Mediastinum   Essential hypertension (Chronic)     Other   Dyslipidemia (Chronic)   Pre-diabetes    Other Visit Diagnoses    Encounter for screening mammogram for malignant neoplasm of breast    -  Primary   Relevant Orders   MM 3D SCREEN BREAST BILATERAL      Needs pelvic Sam and physical to examine vaginal reason, we will also do breast exam.  Recommended taking MiraLAX every day with plenty of fluids even if she has a bowel movement, just see if we can get more consistency, may consider Linzess in the future.  Follow up plan: Return if symptoms worsen or fail to improve, for 4 months for usual checkup, sooner if possible for physical and pelvic exam and breast exam.  Counseling provided for all of the vaccine components No orders of the defined types were placed in this encounter.   Caryl Pina, MD [facility] 01/19/2021, 9:28 AM

## 2021-01-20 ENCOUNTER — Ambulatory Visit
Admission: RE | Admit: 2021-01-20 | Discharge: 2021-01-20 | Disposition: A | Discharge status: Home / Self Care | Payer: Medicare Other | Source: Ambulatory Visit | Attending: Family Medicine | Admitting: Family Medicine

## 2021-01-20 DIAGNOSIS — Z1231 Encounter for screening mammogram for malignant neoplasm of breast: Secondary | ICD-10-CM | POA: Diagnosis not present

## 2021-01-20 IMAGING — MG MM DIGITAL SCREENING BILAT W/ TOMO AND CAD
8 series · 9 of 24 positions shown · non-contrast
Comparison: Previous exam(s).

CLINICAL DATA: Screening.

EXAM:
DIGITAL SCREENING BILATERAL MAMMOGRAM WITH TOMOSYNTHESIS AND CAD
TECHNIQUE: Bilateral screening digital craniocaudal and mediolateral oblique
mammograms were obtained. Bilateral screening digital breast
tomosynthesis was performed. The images were evaluated with
computer-aided detection.

[L MLO synth-2D]
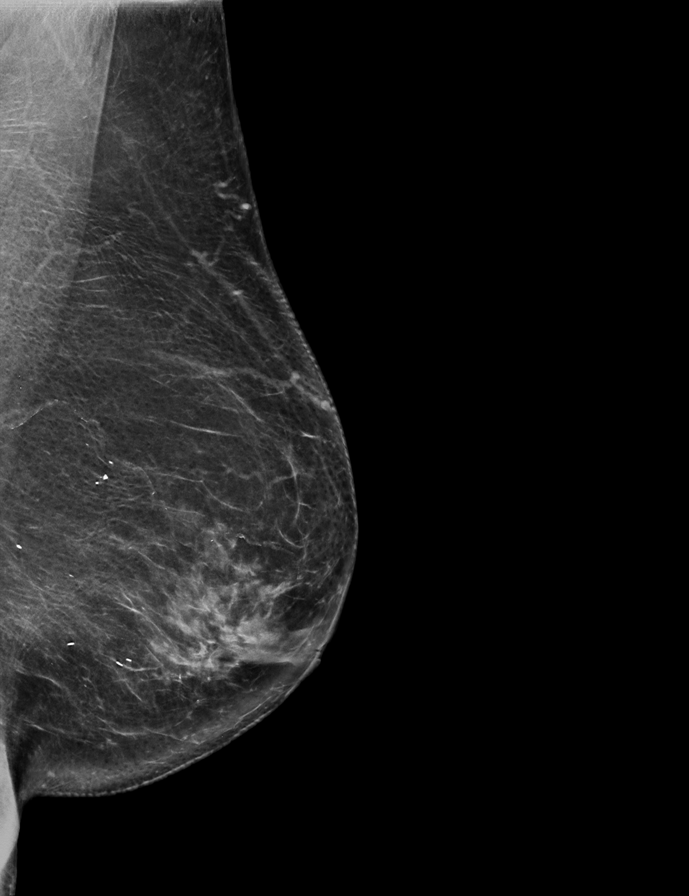

[R MLO synth-2D]
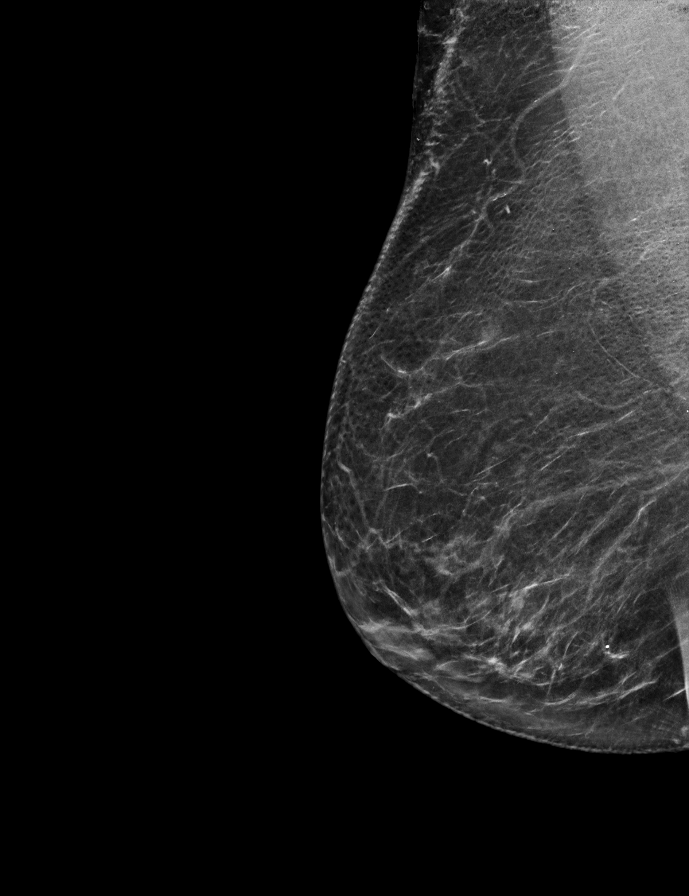

[R CC synth-2D]
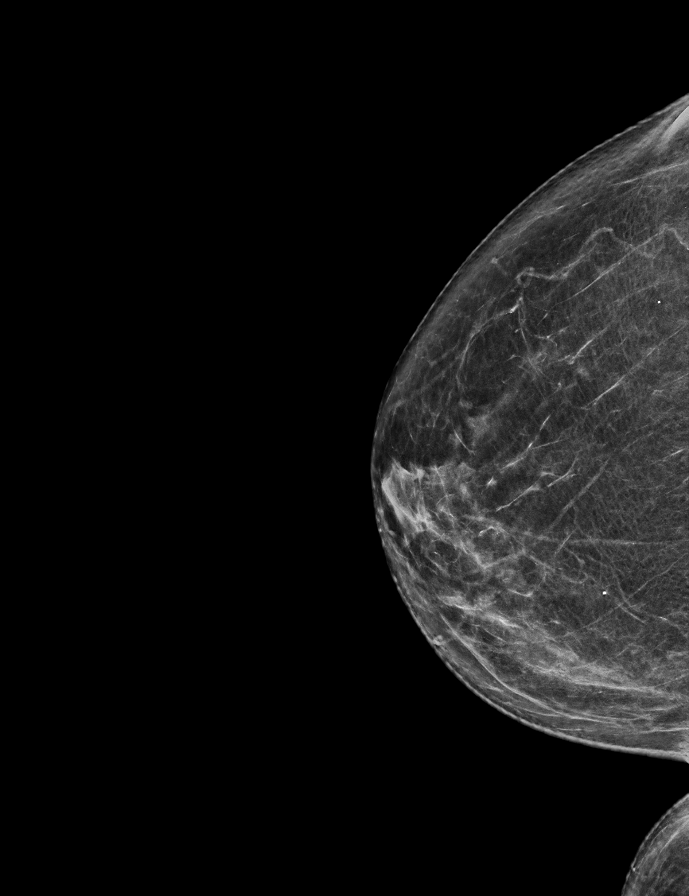

[L CC synth-2D]
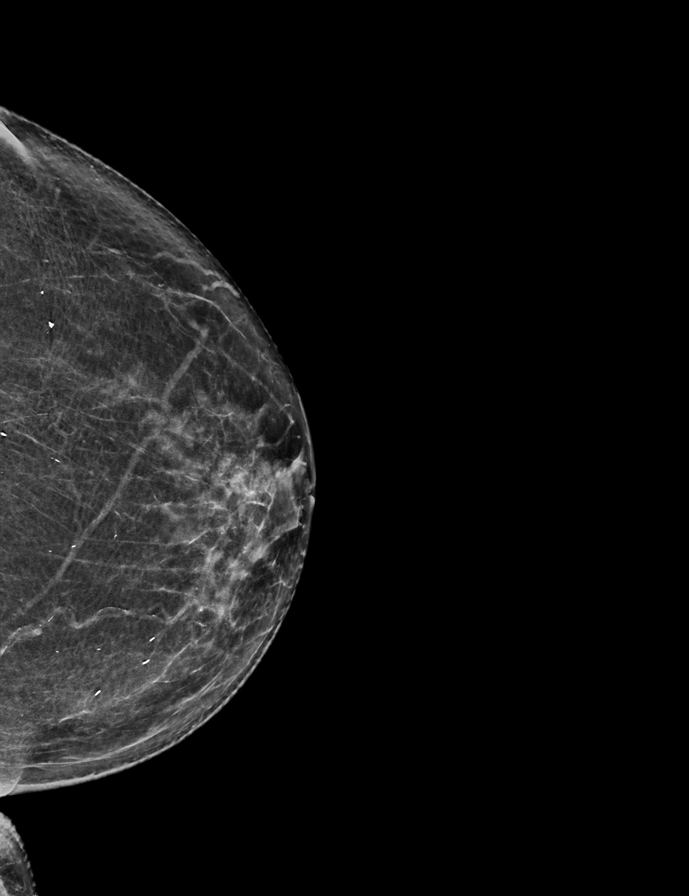

[R MLO tomo · 2 of 67 frames shown]
[frame 22/67]
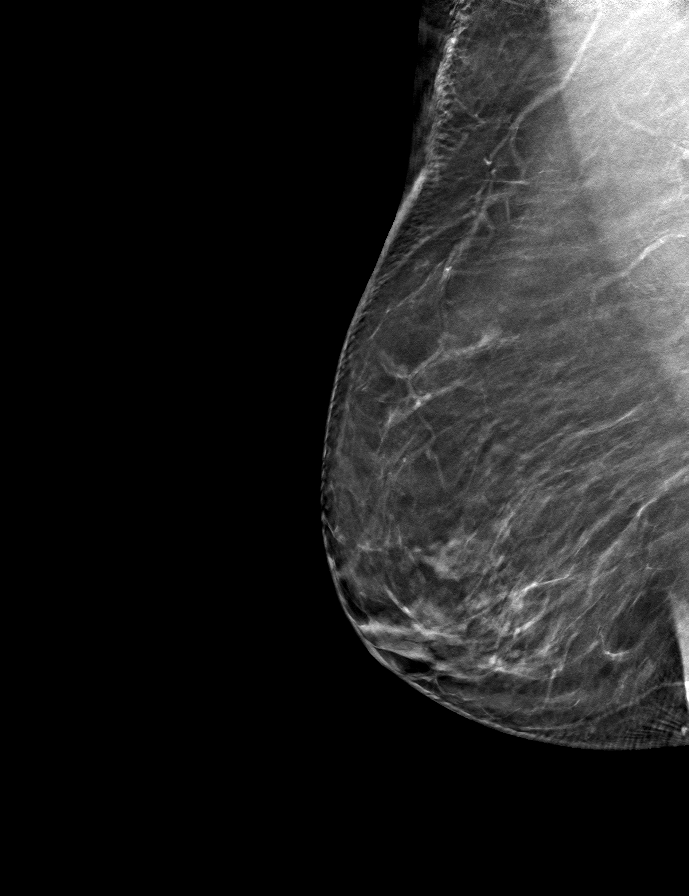
[frame 34/67]
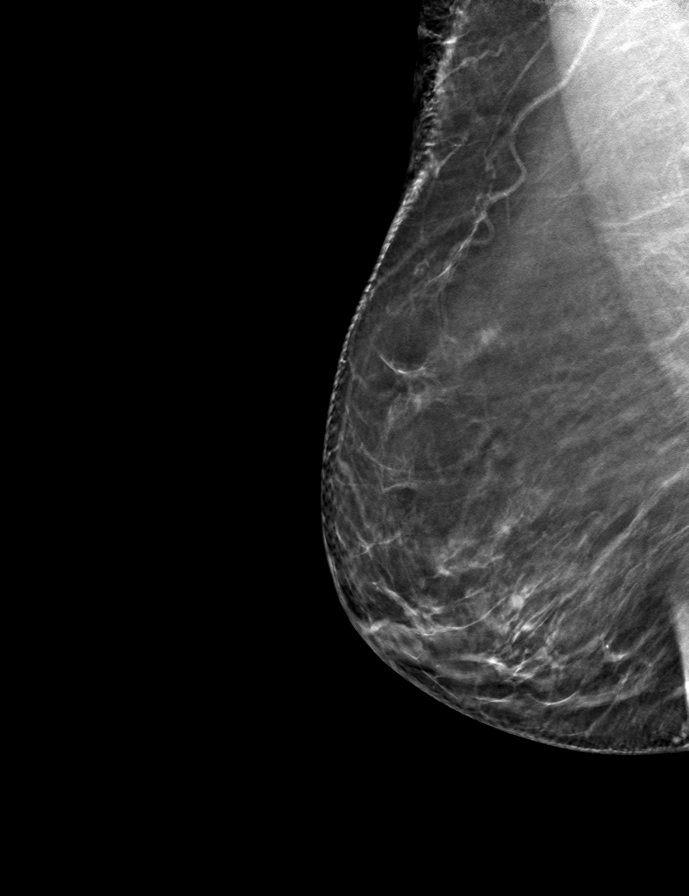

[R CC tomo · tomo slice 34/67.0]
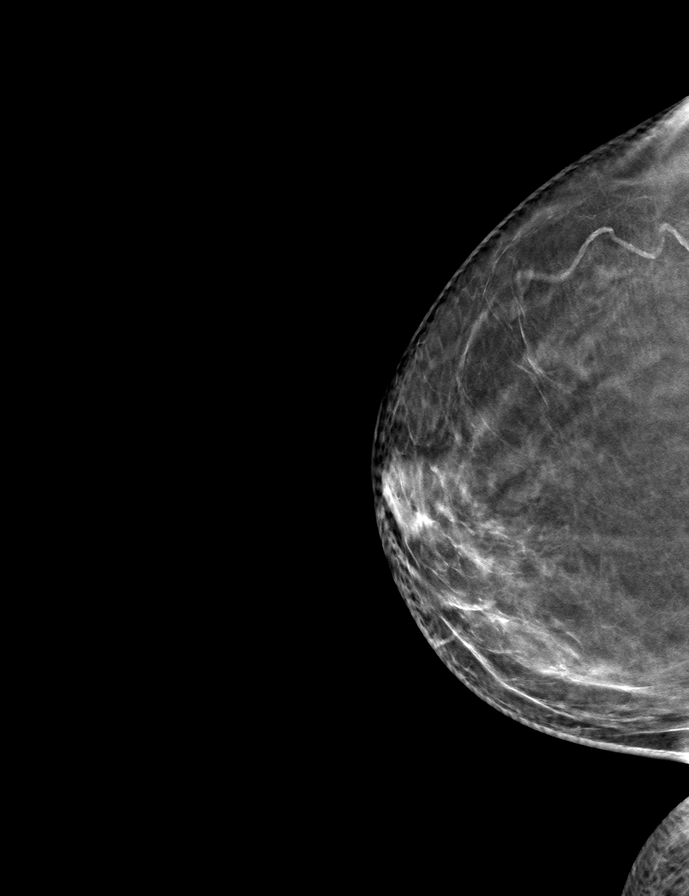

[L CC tomo · tomo slice 33/65.0]
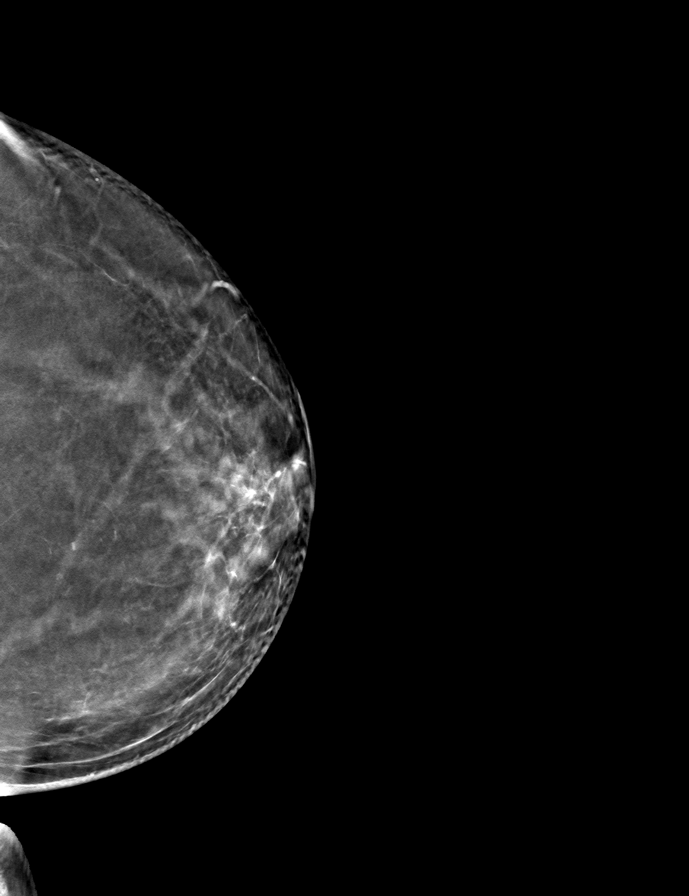

[L MLO tomo · tomo slice 37/72.0]
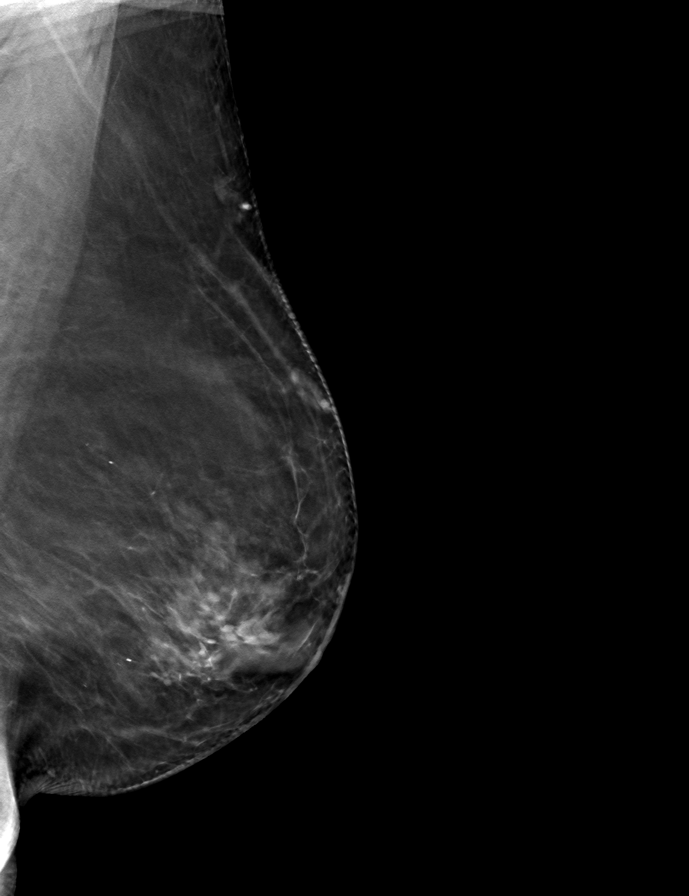

[9 of 24 positions shown; findings below may reference images not displayed]

ACR Breast Density Category c: The breast tissue is heterogeneously
dense, which may obscure small masses.
FINDINGS: There are no findings suspicious for malignancy.
IMPRESSION: No mammographic evidence of malignancy. A result letter of this
screening mammogram will be mailed directly to the patient.

RECOMMENDATION:
Screening mammogram in one year. (Code:[V2])

BI-RADS CATEGORY  1: Negative.

## 2021-02-07 ENCOUNTER — Ambulatory Visit: Payer: Medicare Other

## 2021-02-09 ENCOUNTER — Ambulatory Visit (INDEPENDENT_AMBULATORY_CARE_PROVIDER_SITE_OTHER): Payer: Medicare Other

## 2021-02-09 ENCOUNTER — Other Ambulatory Visit: Payer: Self-pay

## 2021-02-09 DIAGNOSIS — Z23 Encounter for immunization: Secondary | ICD-10-CM

## 2021-02-09 NOTE — Addendum Note (Signed)
Encounter addended by: Earlene Plater on: 02/09/2021 7:27 AM  Actions taken: Imaging Exam begun

## 2021-02-09 NOTE — Addendum Note (Signed)
Encounter addended by: Earlene Plater on: 02/09/2021 7:28 AM  Actions taken: Imaging Exam ended

## 2021-03-07 DIAGNOSIS — M25551 Pain in right hip: Secondary | ICD-10-CM | POA: Diagnosis not present

## 2021-03-07 DIAGNOSIS — M545 Low back pain, unspecified: Secondary | ICD-10-CM | POA: Diagnosis not present

## 2021-03-07 DIAGNOSIS — M160 Bilateral primary osteoarthritis of hip: Secondary | ICD-10-CM | POA: Diagnosis not present

## 2021-03-23 ENCOUNTER — Other Ambulatory Visit: Payer: Self-pay

## 2021-03-23 ENCOUNTER — Encounter: Payer: Self-pay | Admitting: Family Medicine

## 2021-03-23 ENCOUNTER — Ambulatory Visit (INDEPENDENT_AMBULATORY_CARE_PROVIDER_SITE_OTHER): Payer: Medicare Other | Admitting: Family Medicine

## 2021-03-23 VITALS — BP 148/55 | HR 62 | Ht 61.0 in | Wt 139.0 lb

## 2021-03-23 DIAGNOSIS — N6459 Other signs and symptoms in breast: Secondary | ICD-10-CM | POA: Diagnosis not present

## 2021-03-23 DIAGNOSIS — N6452 Nipple discharge: Secondary | ICD-10-CM

## 2021-03-23 DIAGNOSIS — N63 Unspecified lump in unspecified breast: Secondary | ICD-10-CM

## 2021-03-23 MED ORDER — CEPHALEXIN 500 MG PO CAPS: 500.0000 mg | ORAL_CAPSULE | Freq: Four times a day (QID) | ORAL | 0 refills | Status: DC

## 2021-03-23 MED ORDER — TRIAMCINOLONE ACETONIDE 0.1 % EX CREA: 1.0000 "application " | TOPICAL_CREAM | Freq: Two times a day (BID) | CUTANEOUS | 3 refills | Status: DC

## 2021-03-23 NOTE — Addendum Note (Signed)
Addended by: Caryl Pina on: 03/23/2021 03:26 PM   Modules accepted: Orders

## 2021-03-23 NOTE — Progress Notes (Addendum)
BP (!) 148/55   Pulse 62   Ht '5\' 1"'$  (1.549 m)   Wt 139 lb (63 kg)   SpO2 97%   BMI 26.26 kg/m    Subjective:   Patient ID: Melissa Carpenter, female    DOB: 08-30-1933, 87 y.o.   MRN: [id number]  HPI: Melissa Carpenter is a 87 y.o. female presenting on 03/23/2021 for Breast Problem (Nodule on left breast. Noticed last week. Site sore)   HPI Patient is coming in with new nipple discharge and nodule on her left breast that she noticed over the past week.  On exam it is noted that she has an inverted nipple and asked about it, she says it may have been there for a year or 2 but she cannot recall.  She says the discharge is a pink discharge from her nipples.  She just had a mammogram on June 2022 which is just over a month ago which showed that she had breast tissue that was very dense but otherwise essentially negative.  They recommended follow-up for 1 year.  She had not noted a lump or nodule until just recently.  Relevant past medical, surgical, family and social history reviewed and updated as indicated. Interim medical history since our last visit reviewed. Allergies and medications reviewed and updated.  Review of Systems  Constitutional:  Negative for chills and fever.  Eyes:  Negative for visual disturbance.  Respiratory:  Negative for chest tightness and shortness of breath.   Cardiovascular:  Negative for chest pain and leg swelling.  Musculoskeletal:  Negative for back pain and gait problem.  Skin:  Negative for rash.  Neurological:  Negative for light-headedness and headaches.  Psychiatric/Behavioral:  Negative for agitation and behavioral problems.   All other systems reviewed and are negative.  Per HPI unless specifically indicated above   Allergies as of 03/23/2021       Reactions   Codeine Hives   Forteo [parathyroid Hormone (recomb)] Other (See Comments)   Weakness and calcium increase   Raloxifene Other (See Comments)   Eye problems Other reaction(s): Other (See  Comments) Eye problems Eye problems   Morphine Rash   Penicillins Rash   Sulfites Rash   Sulfonamide Derivatives Rash        Medication List        Accurate as of March 23, 2021  3:26 PM. If you have any questions, ask your nurse or doctor.          Accu-Chek Softclix Lancets lancets Use to check blood sugars daily   amLODipine-atorvastatin 10-40 MG tablet Commonly known as: CADUET Take 1 tablet by mouth daily.   aspirin 81 MG tablet Take 81 mg by mouth daily.   Calcium Carbonate-Vitamin D 600-400 MG-UNIT tablet Commonly known as: Caltrate 600+D Take 1 tablet by mouth daily.   cephALEXin 500 MG capsule Commonly known as: KEFLEX Take 1 capsule (500 mg total) by mouth 4 (four) times daily. Started by: Worthy Rancher, MD   cholecalciferol 10 MCG (400 UNIT) Tabs tablet Commonly known as: VITAMIN D3 Take 1,000 Units by mouth.   fish oil-omega-3 fatty acids 1000 MG capsule Take 1 g by mouth daily.   fluticasone 50 MCG/ACT nasal spray Commonly known as: FLONASE SPRAY 2 SPRAYS INTO EACH NOSTRIL EVERY DAY   glucose blood test strip Check blood glucose QD and PRN DxE11.9   hydrALAZINE 25 MG tablet Commonly known as: APRESOLINE TAKE 1 TABLET (25 MG TOTAL) BY MOUTH EVERY EVENING. TAKE WITH EVENING  MEDICATIONS   metoprolol succinate 100 MG 24 hr tablet Commonly known as: TOPROL-XL Take 1 tablet (100 mg total) by mouth daily. Take with or immediately following a meal.   multivitamin tablet Take 1 tablet by mouth daily.   telmisartan 80 MG tablet Commonly known as: MICARDIS TAKE 1 TABLET BY MOUTH EVERY EVENING   triamcinolone cream 0.1 % Commonly known as: KENALOG Apply 1 application topically 2 (two) times daily.         Objective:   BP (!) 148/55   Pulse 62   Ht '5\' 1"'$  (1.549 m)   Wt 139 lb (63 kg)   SpO2 97%   BMI 26.26 kg/m   Wt Readings from Last 3 Encounters:  03/23/21 139 lb (63 kg)  09/20/20 137 lb (62.1 kg)  07/21/20 138 lb (62.6  kg)    Physical Exam Vitals and nursing note reviewed.  Constitutional:      Appearance: Normal appearance.  Chest:  Breasts:    Right: No swelling, inverted nipple, nipple discharge, axillary adenopathy or supraclavicular adenopathy.     Left: Inverted nipple, mass, nipple discharge and tenderness present. No swelling, skin change, axillary adenopathy or supraclavicular adenopathy.    Lymphadenopathy:     Upper Body:     Right upper body: No supraclavicular, axillary or pectoral adenopathy.     Left upper body: No supraclavicular, axillary or pectoral adenopathy.  Neurological:     Mental Status: She is alert.      Assessment & Plan:   Problem List Items Addressed This Visit   None Visit Diagnoses     Nipple discharge    -  Primary   Relevant Medications   cephALEXin (KEFLEX) 500 MG capsule   Other Relevant Orders   MR BREAST LEFT W CONTRAST   Prolactin   BUN+Creat   MR BREAST BILATERAL W WO CONTRAST INC CAD   Breast nodule       Relevant Medications   cephALEXin (KEFLEX) 500 MG capsule   Other Relevant Orders   MR BREAST LEFT W CONTRAST   Prolactin   BUN+Creat   Inverted nipple       Relevant Medications   cephALEXin (KEFLEX) 500 MG capsule   Other Relevant Orders   MR BREAST LEFT W CONTRAST   Prolactin   BUN+Creat     Patient just had a mammogram on 01/20/2021 which shows that she has dense breast tissue but did not detect any abnormalities.  She started 1 week ago with nipple discharge and breast tenderness with the lump on that left breast Gave antibiotic just in case of mastitis but need to rule out breast cancer Follow up plan: Return if symptoms worsen or fail to improve.  Counseling provided for all of the vaccine components Orders Placed This Encounter  Procedures   MR BREAST LEFT W CONTRAST   MR BREAST BILATERAL W [place] CAD   Prolactin   BUN+Creat    Caryl Pina, MD [facility] 03/23/2021, 3:26  PM

## 2021-03-24 ENCOUNTER — Ambulatory Visit (HOSPITAL_COMMUNITY)
Admission: RE | Admit: 2021-03-24 | Discharge: 2021-03-24 | Disposition: A | Discharge status: Home / Self Care | Payer: Medicare Other | Source: Ambulatory Visit | Attending: Family Medicine | Admitting: Family Medicine

## 2021-03-24 DIAGNOSIS — N6452 Nipple discharge: Secondary | ICD-10-CM | POA: Insufficient documentation

## 2021-03-24 DIAGNOSIS — N644 Mastodynia: Secondary | ICD-10-CM | POA: Diagnosis not present

## 2021-03-24 IMAGING — MR MR BREAST BILAT WO/W CM
9 of 13 series · 27 of 48 positions shown · IV contrast (6.5 GADAVIST)
Comparison: Screening mammogram [DATE]
COMPARISON: Screening mammogram [DATE]

Addendum:
CLINICAL DATA: Nipple discharge and pain starting 1 week ago.

LABS:  None obtained at the time of imaging.
EXAM:
BILATERAL BREAST MRI WITH AND WITHOUT CONTRAST
TECHNIQUE: Multiplanar, multisequence MR images of both breasts were obtained
prior to and following the intravenous administration of 6.5 ml of
Gadavist

[Series 2: T2 · axial · 3.0mm · 0.89mm/px · z∈[-81,+81]mm · 3 of 55 slices shown]
[im 1/55]
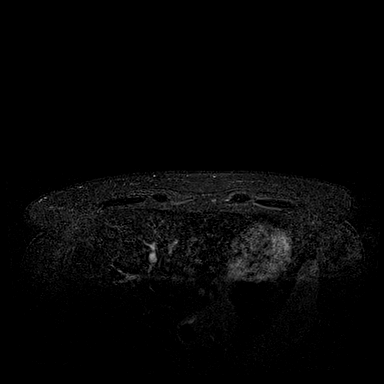
[im 28/55]
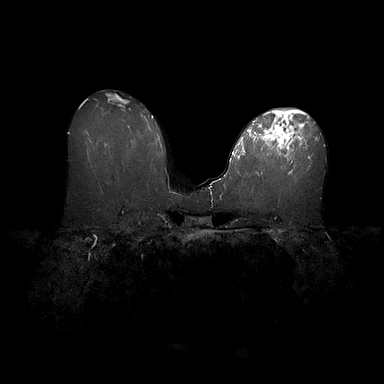
[im 55/55]
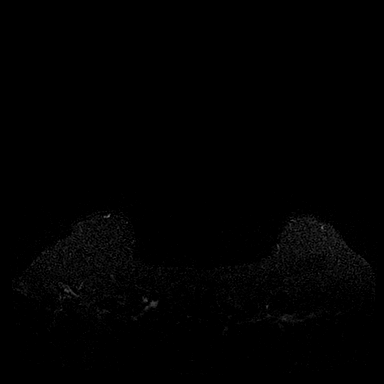

[Series 3: T1 fat-sat · axial · 1.2mm · 0.71mm/px · z∈[-71,+71]mm · 5 of 120 slices shown (1 of 5)]
[im 1/120]
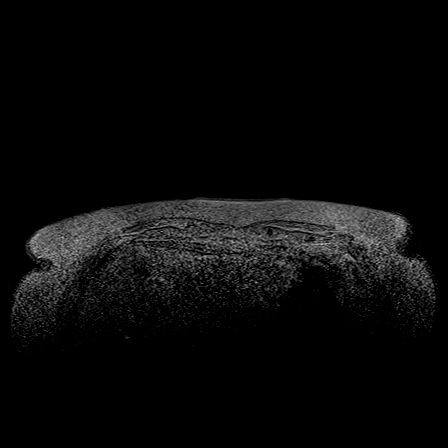
[im 30/120]
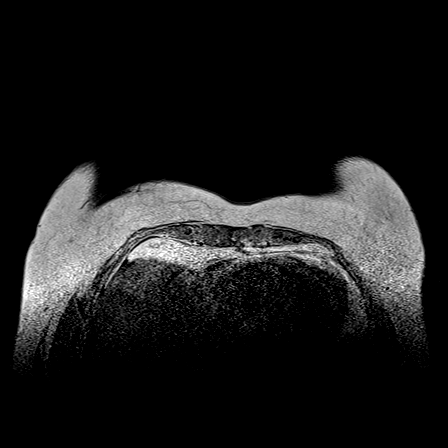
[im 60/120]
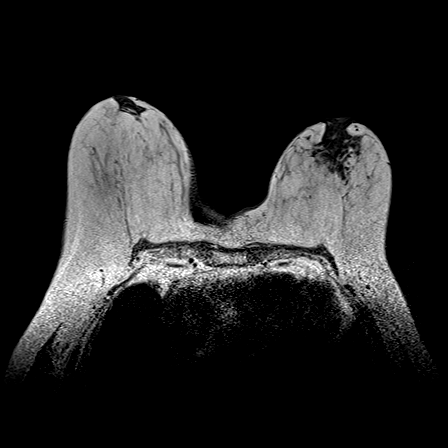
[im 90/120]
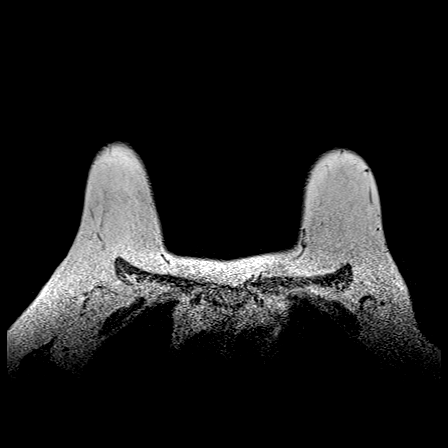
[im 120/120]
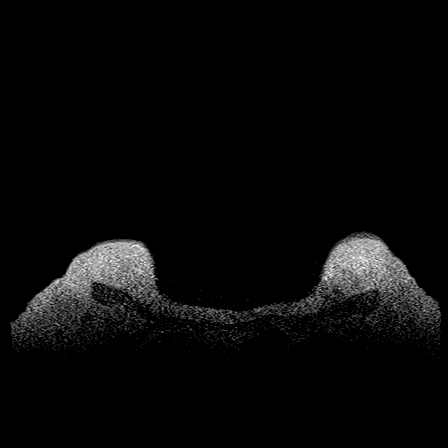

[Series 4: T1 fat-sat · axial · 1.6mm · 0.87mm/px · z∈[-89,+89]mm · 4 of 112 slices shown (2 of 5)]
[im 1/112]
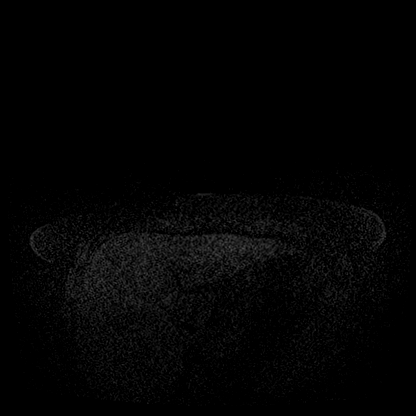
[im 38/112]
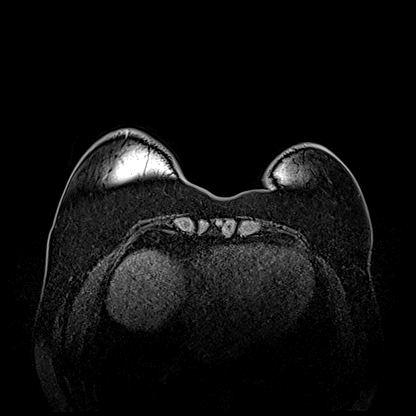
[im 75/112]
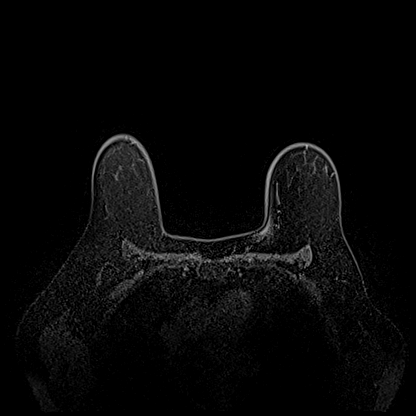
[im 112/112]
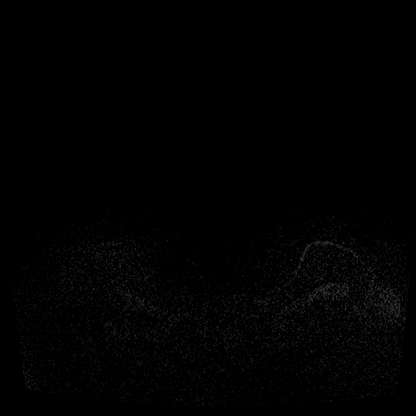

[Series 5: T1 fat-sat · axial · 1.6mm · 0.87mm/px · z∈[-89,+89]mm · 4 of 112 slices shown (3 of 5)]
[im 1/112]
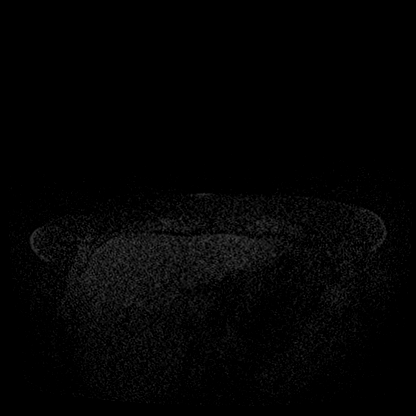
[im 38/112]
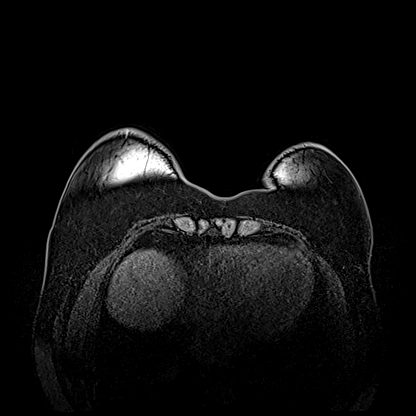
[im 75/112]
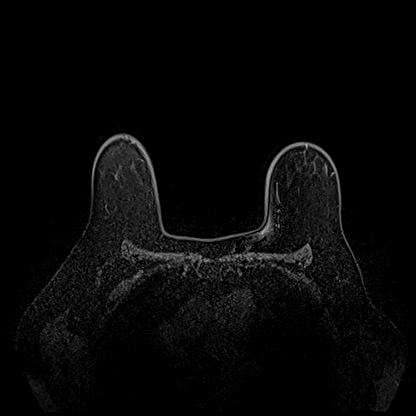
[im 112/112]
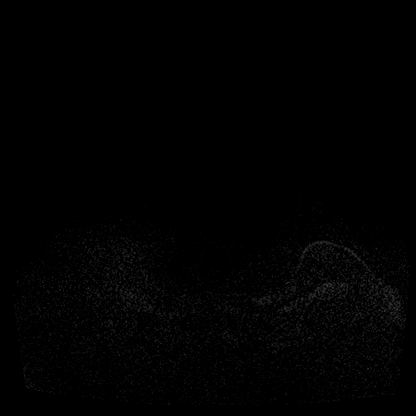

[Series 6: T1 fat-sat · axial · 1.6mm · 0.87mm/px · z∈[-89,+89]mm · 4 of 112 slices shown (4 of 5)]
[im 1/112]
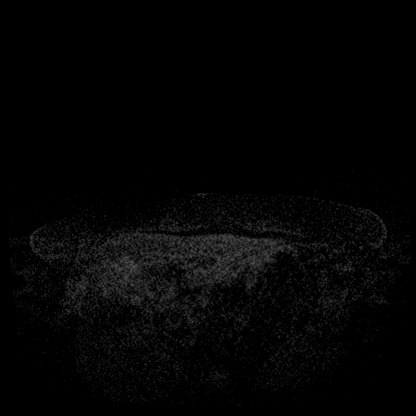
[im 38/112]
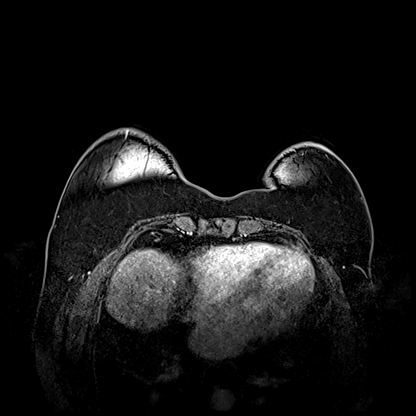
[im 75/112]
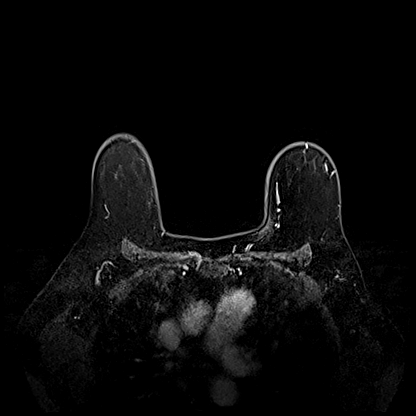
[im 112/112]
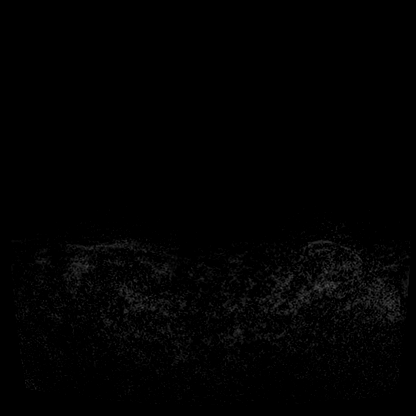

[Series 7: T1 · axial · 1.6mm · 0.87mm/px · z∈[-89,+89]mm · 4 of 112 slices shown (1 of 3)]
[im 1/112]
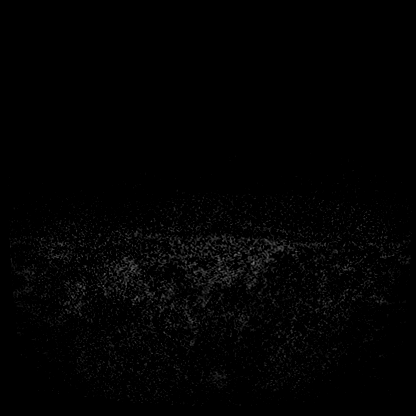
[im 38/112]
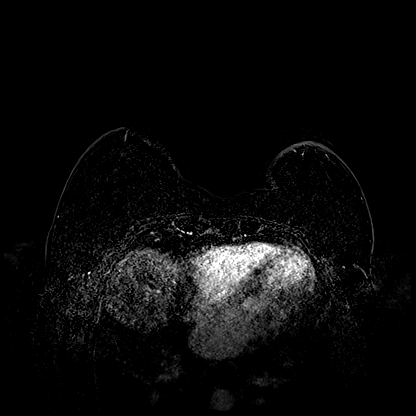
[im 75/112]
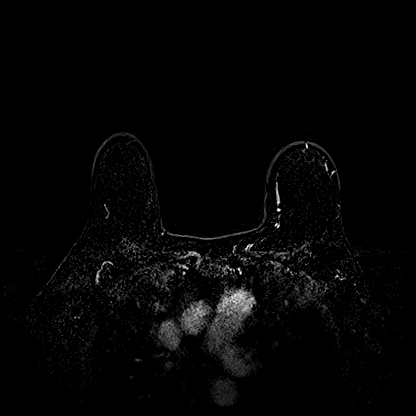
[im 112/112]
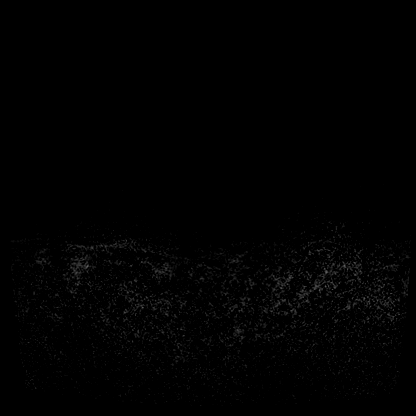

[Series 8: T1 · coronal · 360.0mm · 0.87mm/px · 1 of 3 slices shown (2 of 3)]
[im 1/3]
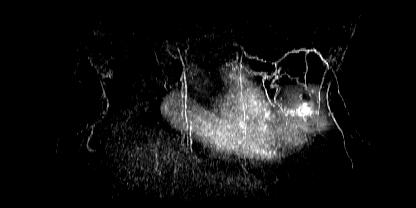

[Series 9: T1 · axial · 179.2mm · 0.87mm/px · 1 of 3 slices shown (3 of 3)]
[im 1/3]
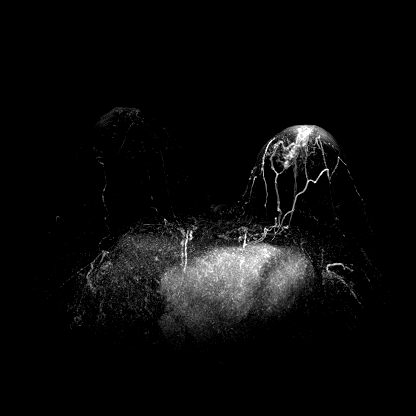

[Series 10: T1 fat-sat · axial · 1.6mm · 0.87mm/px · 1 of 112 slices shown (5 of 5)]
[im 1/112]
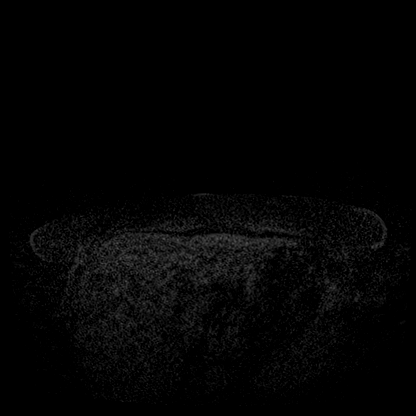

[27 of 48 positions shown; findings below may reference images not displayed]

Three-dimensional MR images were rendered by post-processing of the
original MR data on an independent workstation. The
three-dimensional MR images were interpreted, and findings are
reported in the following complete MRI report for this study. Three
dimensional images were evaluated at the independent interpreting
workstation using the DynaCAD thin client.
FINDINGS: Breast composition: b. Scattered fibroglandular tissue.

Background parenchymal enhancement: Minimal

Right breast: No mass or abnormal enhancement.

Left breast: There is asymmetric enhancement of the LEFT nipple.
Eight dilated duct in the immediate retroareolar region is
surrounded by non mass enhancement. This non mass enhancement
primarily involves the LOWER INNER QUADRANT of the LEFT breast and
spans 2.3 x 3.8 x 4.6 centimeters. The enhancement shows primarily
persistent type kinetics. No discrete mass identified in the LEFT
breast.

Lymph nodes: Within the RIGHT axilla majority of the lymph nodes
show normal morphology. A single lymph node in the UPPER level I
station shows cortical thickening and overall size of 1.3 x
centimeters. LEFT axilla is negative. Internal mammary chain is
unremarkable.

Ancillary findings:  None
IMPRESSION: Suspicious non mass enhancement involving the nipple, retroareolar
region, and LOWER INNER QUADRANT of the LEFT breast.

RECOMMENDATION:
Recommend 2 site MR guided core biopsy of the LEFT breast to include
the retroareolar region and LOWER INNER QUADRANT.

BI-RADS CATEGORY  4: Suspicious.

ADDENDUM:
Recommendation:

1. Recommend 2 site MR guided core biopsy of the LEFT breast to
include the retroareolar region and LOWER INNER QUADRANT.
2. Recommend RIGHT axillary ultrasound to evaluate possible abnormal
lymph node.

*** End of Addendum ***
Three-dimensional MR images were rendered by post-processing of the
original MR data on an independent workstation. The
three-dimensional MR images were interpreted, and findings are
reported in the following complete MRI report for this study. Three
dimensional images were evaluated at the independent interpreting
workstation using the DynaCAD thin client.
FINDINGS: Breast composition: b. Scattered fibroglandular tissue.

Background parenchymal enhancement: Minimal

Right breast: No mass or abnormal enhancement.

Left breast: There is asymmetric enhancement of the LEFT nipple.
Eight dilated duct in the immediate retroareolar region is
surrounded by non mass enhancement. This non mass enhancement
primarily involves the LOWER INNER QUADRANT of the LEFT breast and
spans 2.3 x 3.8 x 4.6 centimeters. The enhancement shows primarily
persistent type kinetics. No discrete mass identified in the LEFT
breast.

Lymph nodes: Within the RIGHT axilla majority of the lymph nodes
show normal morphology. A single lymph node in the UPPER level I
station shows cortical thickening and overall size of 1.3 x
centimeters. LEFT axilla is negative. Internal mammary chain is
unremarkable.

Ancillary findings:  None
IMPRESSION: Suspicious non mass enhancement involving the nipple, retroareolar
region, and LOWER INNER QUADRANT of the LEFT breast.

RECOMMENDATION:
Recommend 2 site MR guided core biopsy of the LEFT breast to include
the retroareolar region and LOWER INNER QUADRANT.

BI-RADS CATEGORY  4: Suspicious.

## 2021-03-24 MED ORDER — GADOBUTROL 1 MMOL/ML IV SOLN
6.5000 mL | Freq: Once | INTRAVENOUS | Status: AC | PRN
  Administered 2021-03-24: 6.5 mL via INTRAVENOUS

## 2021-03-25 ENCOUNTER — Telehealth: Payer: Self-pay | Admitting: Family Medicine

## 2021-03-25 ENCOUNTER — Other Ambulatory Visit: Payer: Self-pay | Admitting: Family Medicine

## 2021-03-25 ENCOUNTER — Other Ambulatory Visit: Payer: Self-pay

## 2021-03-25 DIAGNOSIS — N6459 Other signs and symptoms in breast: Secondary | ICD-10-CM

## 2021-03-25 DIAGNOSIS — R928 Other abnormal and inconclusive findings on diagnostic imaging of breast: Secondary | ICD-10-CM

## 2021-03-25 DIAGNOSIS — N63 Unspecified lump in unspecified breast: Secondary | ICD-10-CM

## 2021-03-25 NOTE — Telephone Encounter (Signed)
Dr. Warrick Parisian has reviewed the results. He will discuss with pt.

## 2021-03-25 NOTE — Telephone Encounter (Signed)
Juliann Pulse called from [facility] requesting to speak with Dr Neldon Mc nurse. Needs MRI results to be reviewed. Says nurse can call her today before 3:30 or on Monday.   [phone]

## 2021-03-25 NOTE — Progress Notes (Signed)
Patient was notified of MRI and possible spot for cancer that needs to be biopsied.  We will place referral, discussed this with the patient over the phone and she understands and will go forward with the biopsy.

## 2021-03-28 ENCOUNTER — Other Ambulatory Visit: Payer: Self-pay | Admitting: Family Medicine

## 2021-03-28 DIAGNOSIS — N6459 Other signs and symptoms in breast: Secondary | ICD-10-CM

## 2021-03-28 DIAGNOSIS — R928 Other abnormal and inconclusive findings on diagnostic imaging of breast: Secondary | ICD-10-CM

## 2021-03-28 DIAGNOSIS — N63 Unspecified lump in unspecified breast: Secondary | ICD-10-CM

## 2021-03-29 ENCOUNTER — Telehealth: Payer: Self-pay | Admitting: Family Medicine

## 2021-03-29 LAB — BUN+CREAT
BUN/Creatinine Ratio: 26 (ref 12–28)
BUN: 17 mg/dL (ref 8–27)
Creatinine, Ser: 0.66 mg/dL (ref 0.57–1.00)
eGFR: 86 mL/min/{1.73_m2} (ref >59)

## 2021-03-29 LAB — PROLACTIN: Prolactin: 11.3 ng/mL (ref 4.8–23.3)

## 2021-03-30 NOTE — Telephone Encounter (Signed)
SPOKE TO PATIENT IN REGARDS TO RESULTS AND UP COMING TESTING

## 2021-04-07 ENCOUNTER — Ambulatory Visit
Admission: RE | Admit: 2021-04-07 | Discharge: 2021-04-07 | Disposition: A | Discharge status: Home / Self Care | Payer: Medicare Other | Source: Ambulatory Visit | Attending: Family Medicine | Admitting: Family Medicine

## 2021-04-07 ENCOUNTER — Other Ambulatory Visit: Payer: Self-pay | Admitting: Radiology

## 2021-04-07 ENCOUNTER — Other Ambulatory Visit: Payer: Medicare Other

## 2021-04-07 ENCOUNTER — Other Ambulatory Visit: Payer: Self-pay

## 2021-04-07 DIAGNOSIS — R928 Other abnormal and inconclusive findings on diagnostic imaging of breast: Secondary | ICD-10-CM

## 2021-04-07 DIAGNOSIS — N63 Unspecified lump in unspecified breast: Secondary | ICD-10-CM

## 2021-04-07 DIAGNOSIS — N6459 Other signs and symptoms in breast: Secondary | ICD-10-CM

## 2021-04-07 DIAGNOSIS — N6489 Other specified disorders of breast: Secondary | ICD-10-CM | POA: Diagnosis not present

## 2021-04-07 DIAGNOSIS — N6032 Fibrosclerosis of left breast: Secondary | ICD-10-CM | POA: Diagnosis not present

## 2021-04-07 IMAGING — MG MM BREAST LOCALIZATION CLIP
4 series · 4 of 12 positions shown · non-contrast
Comparison: Previous exam(s).

CLINICAL DATA: Evaluate biopsy markers

EXAM:
3D DIAGNOSTIC LEFT MAMMOGRAM POST MRI BIOPSY

[L CC synth-2D]
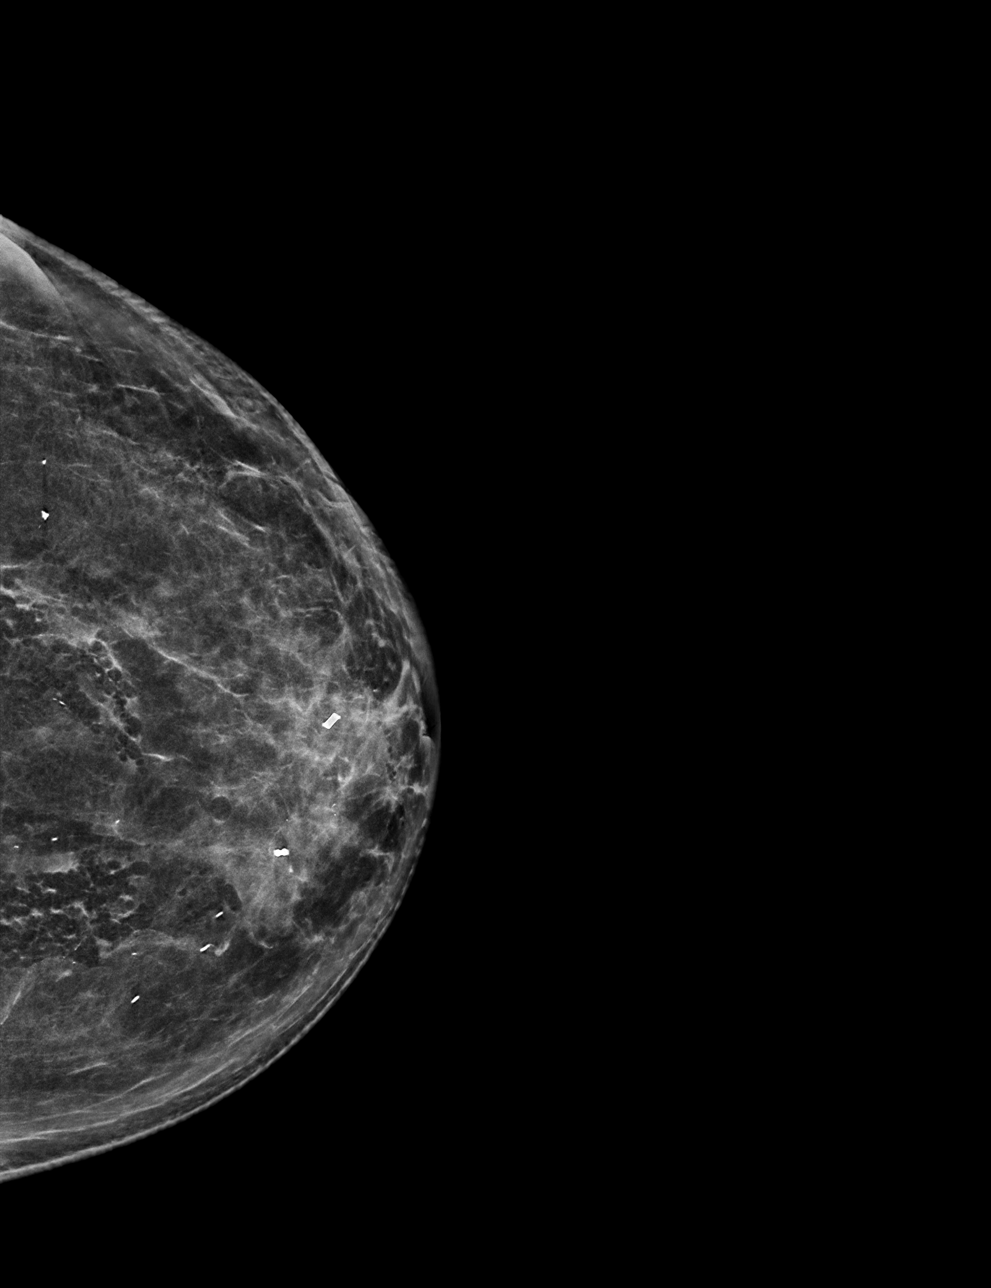

[L ML synth-2D]
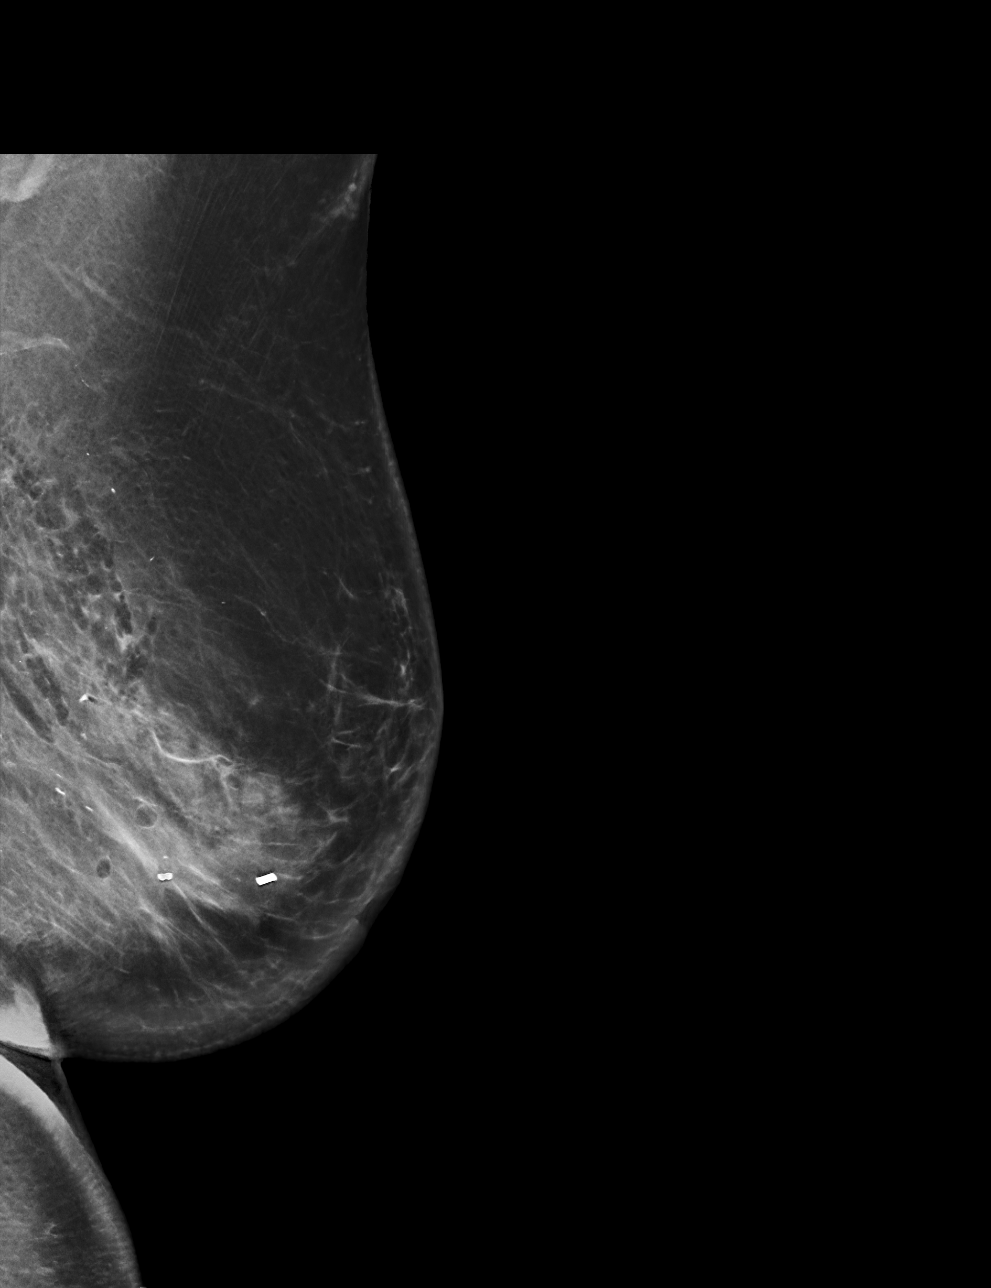

[L CC tomo · tomo slice 36/71.0]
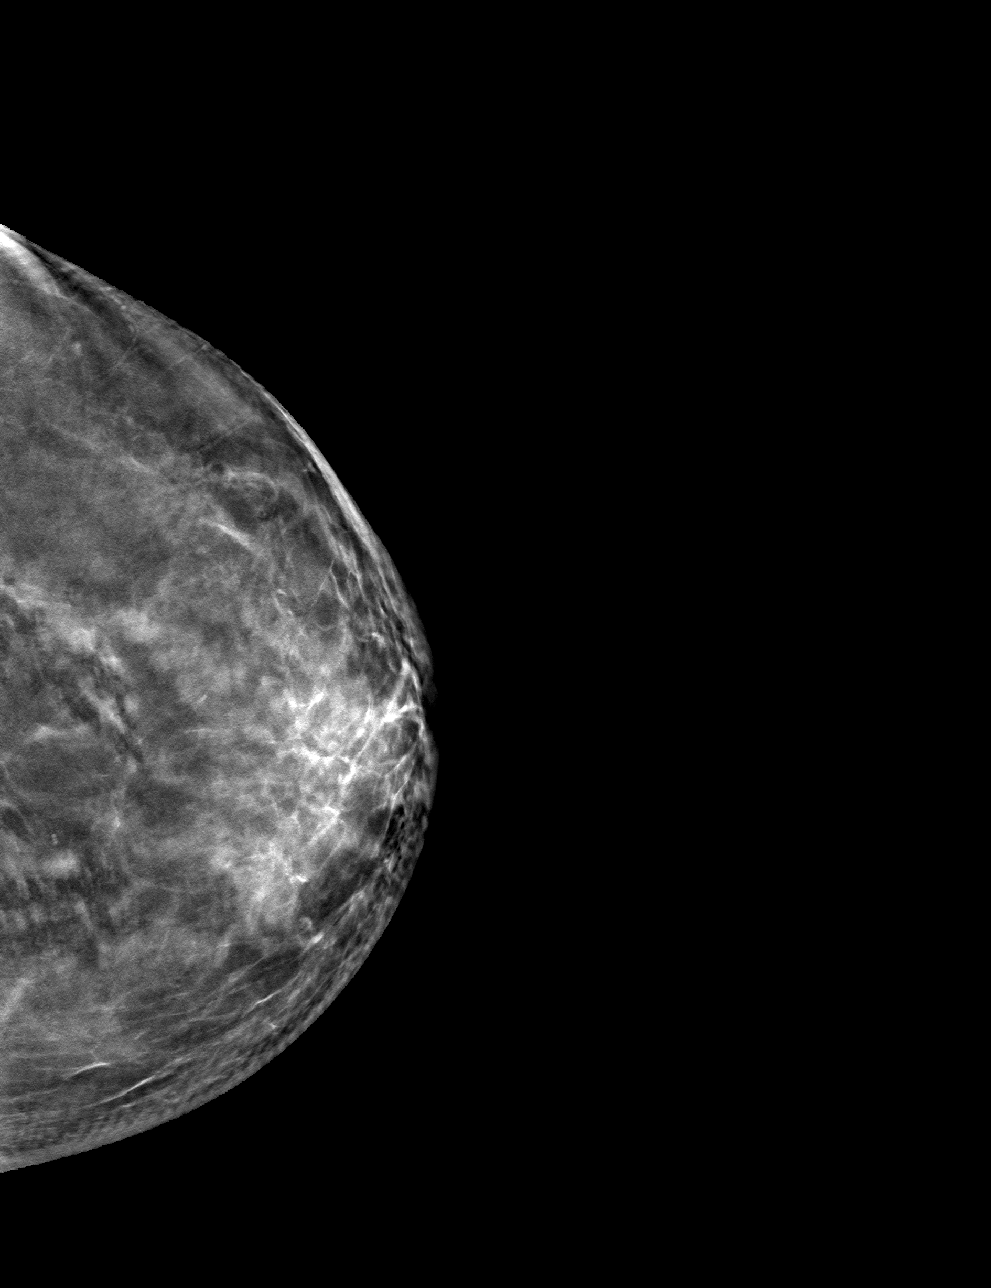

[L ML tomo · tomo slice 45/89.0]
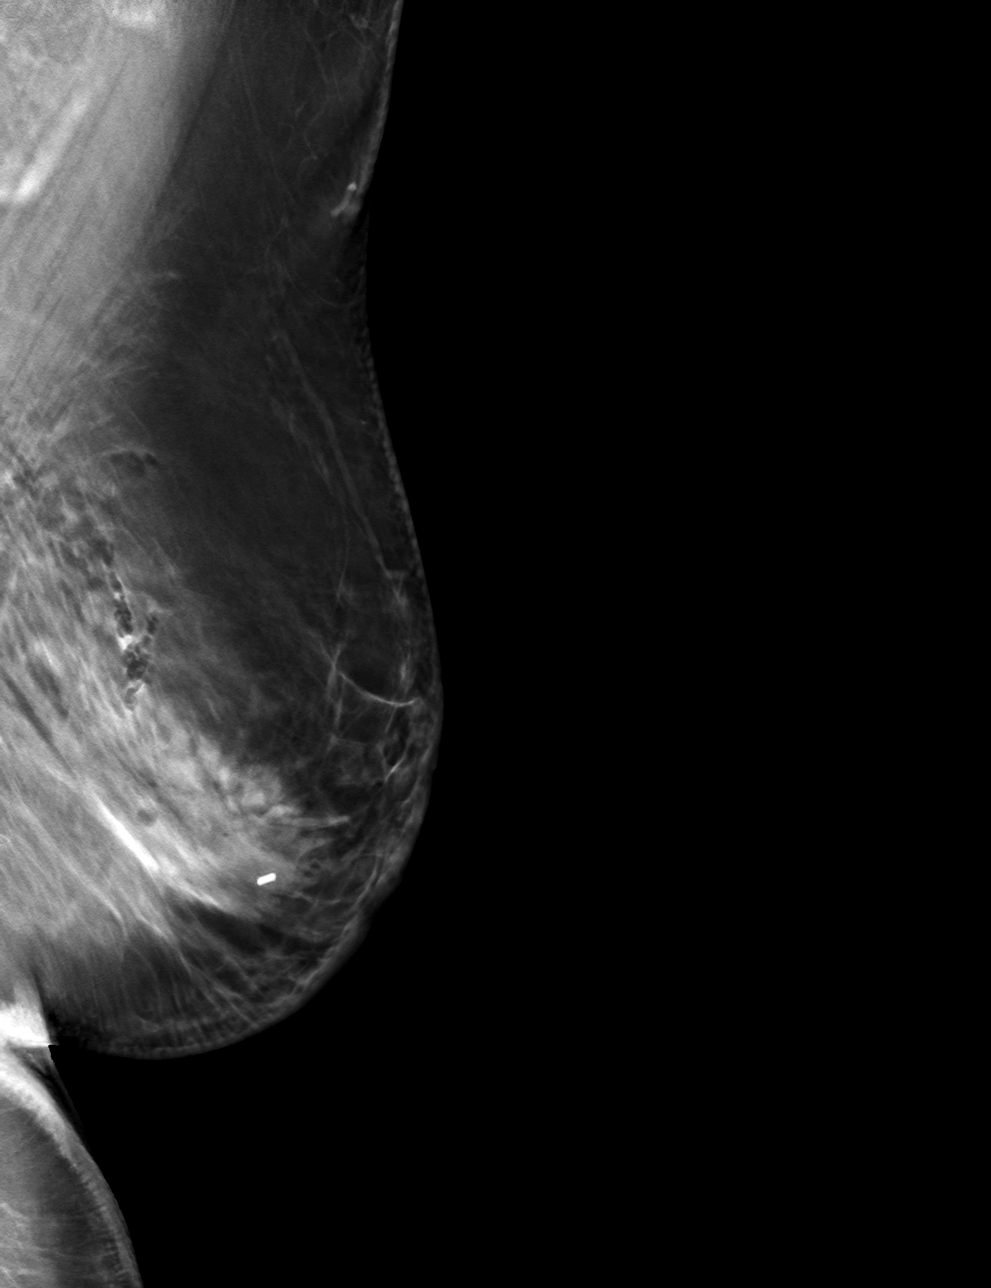

[4 of 12 positions shown; findings below may reference images not displayed]

FINDINGS: 3D Mammographic images were obtained following MRI guided biopsy of
the anterior and posterior extent of non mass enhancement. The
cylinder shaped clip marks the anterior extent of enhancement. The
dog bone shaped clip marks the posteromedial aspect of enhancement.
The biopsy clips are up to 2.3 cm apart.
IMPRESSION: The cylinder shaped clip marks the anterior extent of enhancement.
The dog bone shaped clip marks the posteromedial aspect of
enhancement.

Final Assessment: Post Procedure Mammograms for Marker Placement

## 2021-04-07 IMAGING — US US AXILLARY RIGHT
1 series · 1 of 1 positions shown · non-contrast
Comparison: Previous exam(s).

CLINICAL DATA: Second-look ultrasound of the right axilla for a
prominent node seen on MRI.

EXAM:
ULTRASOUND OF THE RIGHT AXILLA

[Series 1: us axillary right · 0.07mm/px · 1 of 1 slices shown]
[im 1/1]
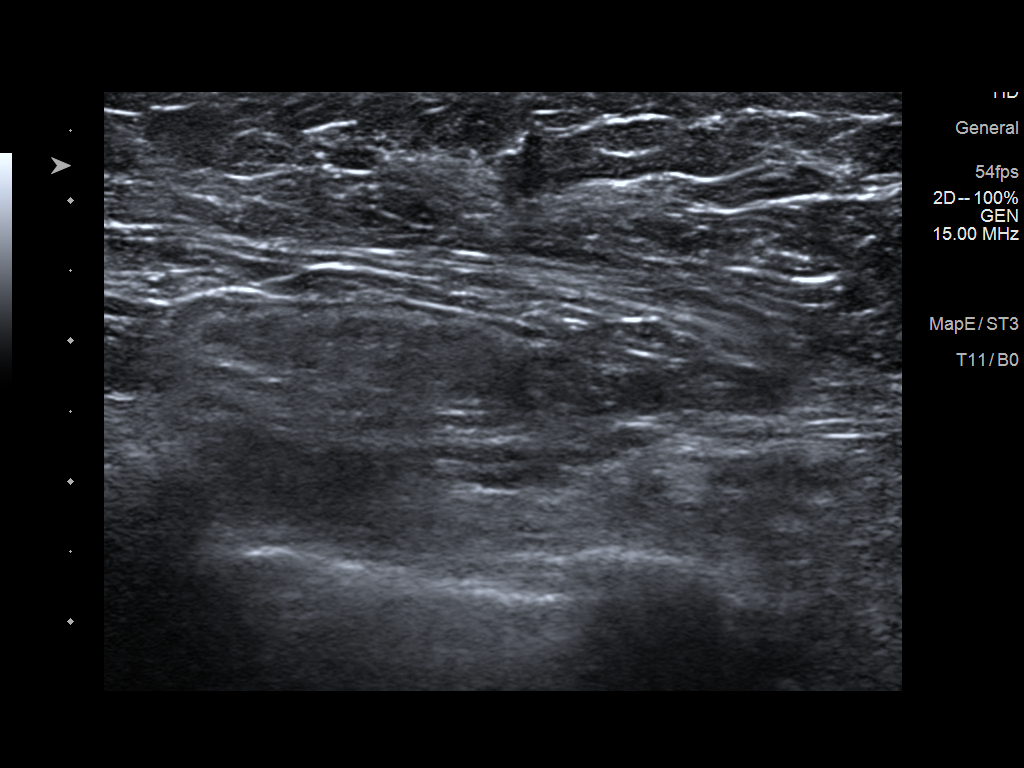

[1 of 1 positions shown; findings below may reference images not displayed]

FINDINGS: On physical exam,no suspicious lumps are identified.

Ultrasound is performed, showing no abnormal nodes in the right
axilla.
IMPRESSION: No abnormal lymph node seen in the right axilla.

RECOMMENDATION:
No follow-up needed for the right axilla.

I have discussed the findings and recommendations with the patient.
If applicable, a reminder letter will be sent to the patient
regarding the next appointment.

BI-RADS CATEGORY  2: Benign.

## 2021-04-07 IMAGING — MR MR BREAST BX W LOC DEV 1ST LESION IMAGE BX SPEC MR GUIDE*L*
6 of 8 series · 32 of 48 positions shown · IV contrast (gadavist)
Comparison: Previous exams.
COMPARISON: Previous exams.

Addendum:
CLINICAL DATA: MRI guided biopsy of the anterior and posterior
extent of the left breast non-mass enhancement.

EXAM:
MRI GUIDED CORE NEEDLE BIOPSY OF THE LEFT BREAST
TECHNIQUE: Multiplanar, multisequence MR imaging of the left breast was
performed both before and after administration of intravenous
contrast.
CONTRAST:  6 mL Gadavist

[Series 2: fiducial unilateral · sagittal · 2.0mm · 1.33mm/px · 1 of 52 slices shown]
[im 1/52]
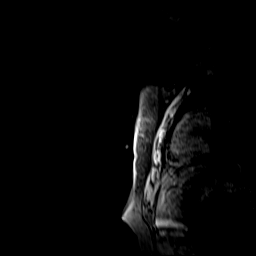

[Series 3: dynamic pre · axial · non-contrast · 1.3mm · 0.73mm/px · z∈[-67,+119]mm · 6 of 144 slices shown]
[im 1/144]
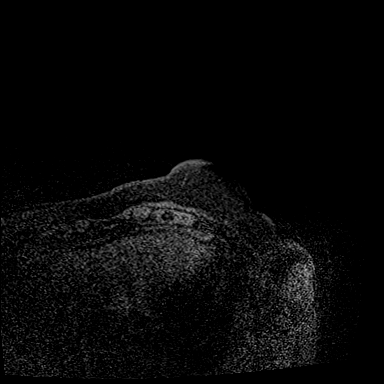
[im 29/144]
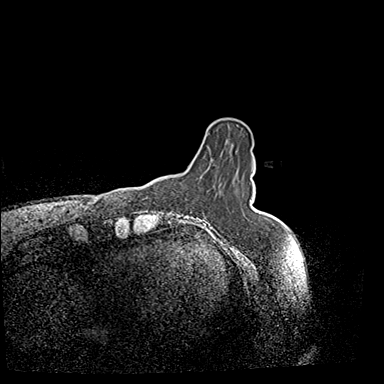
[im 58/144]
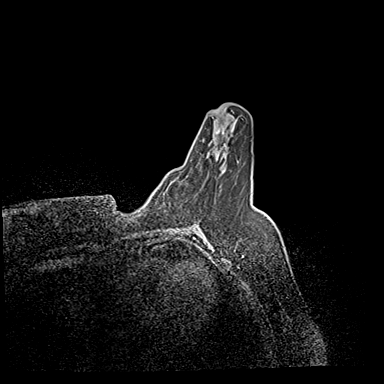
[im 86/144]
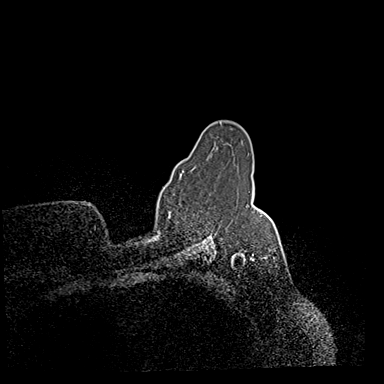
[im 115/144]
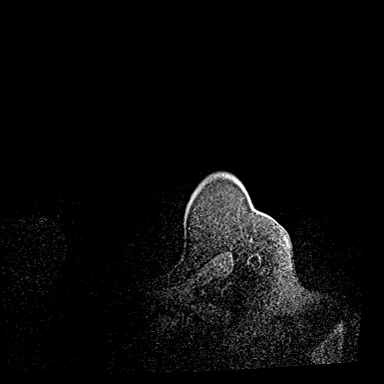
[im 144/144]
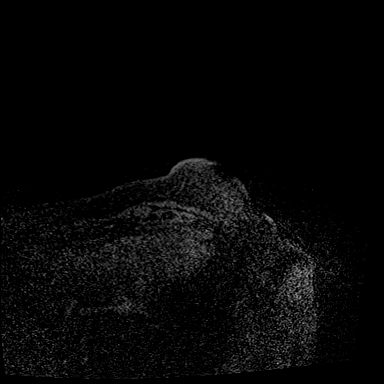

[Series 4: dynamic post 20 · axial · 1.3mm · 0.73mm/px · z∈[-67,+119]mm · 6 of 144 slices shown (1 of 2)]
[im 1/144]
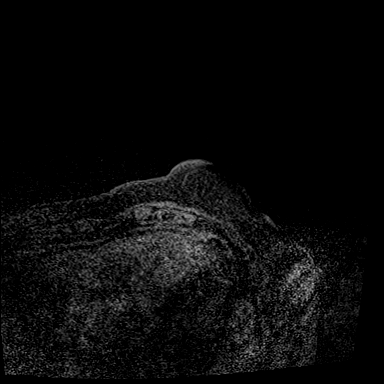
[im 29/144]
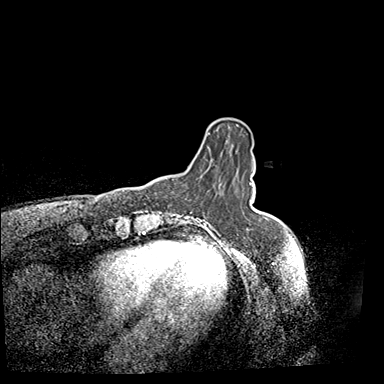
[im 58/144]
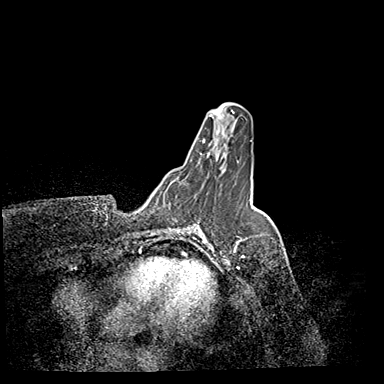
[im 86/144]
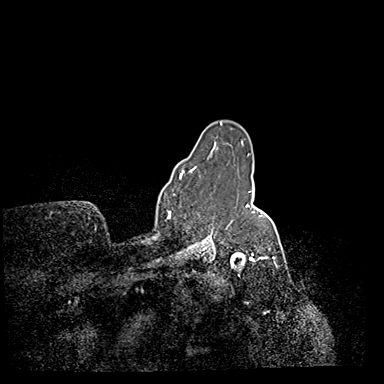
[im 115/144]
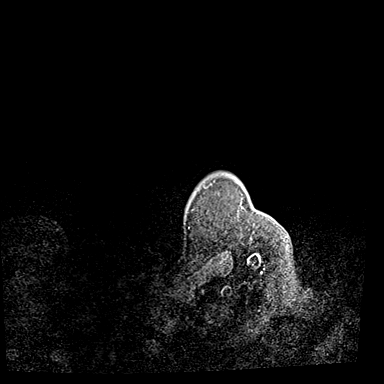
[im 144/144]
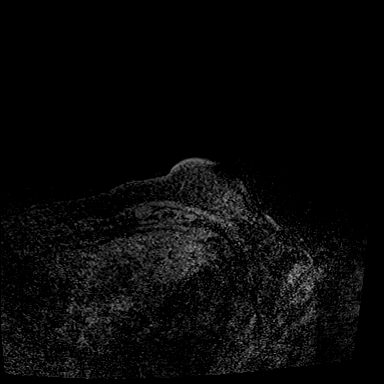

[Series 5: dynamic post 20 · axial · 1.3mm · 0.73mm/px · z∈[-67,+119]mm · 7 of 144 slices shown (2 of 2)]
[im 1/144]
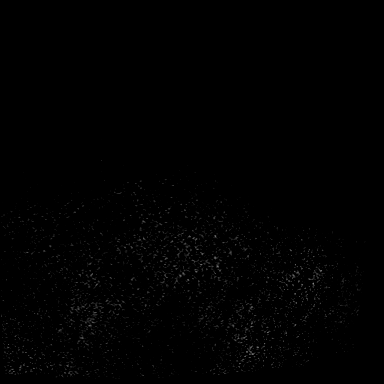
[im 24/144]
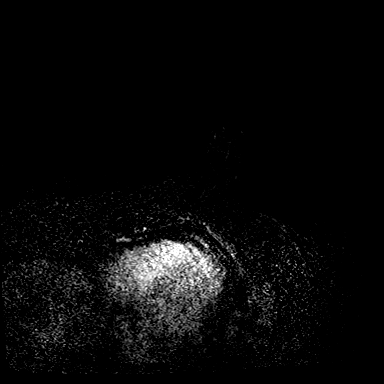
[im 48/144]
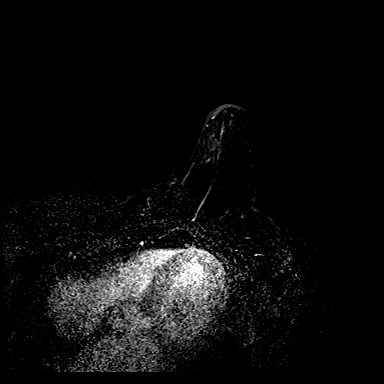
[im 72/144]
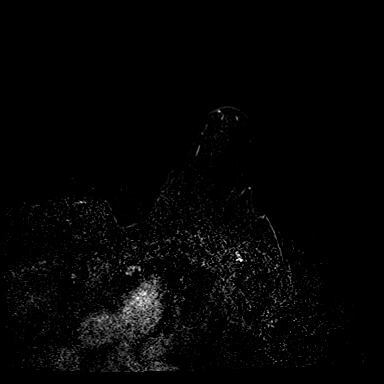
[im 96/144]
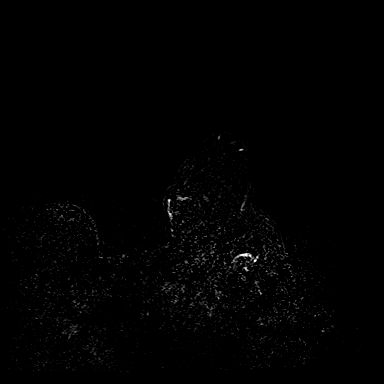
[im 120/144]
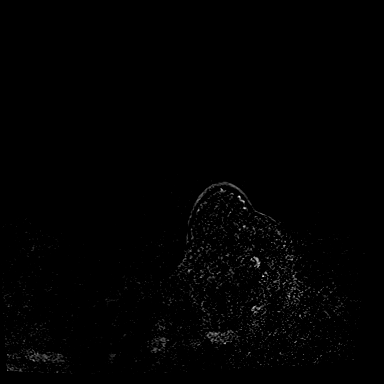
[im 144/144]
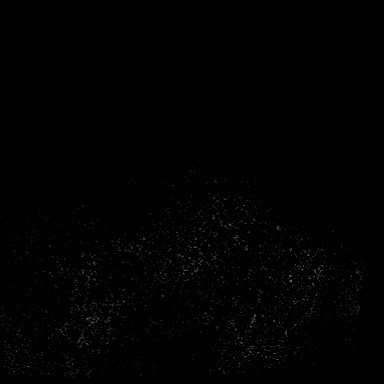

[Series 6: dynamic post 4min · axial · 1.3mm · 0.73mm/px · z∈[-67,+119]mm · 7 of 144 slices shown]
[im 1/144]
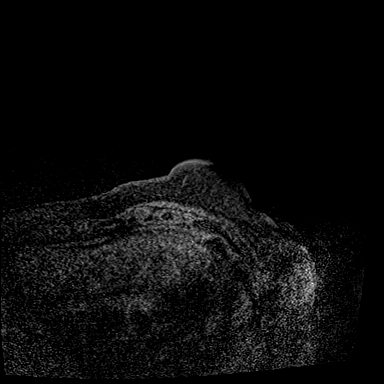
[im 24/144]
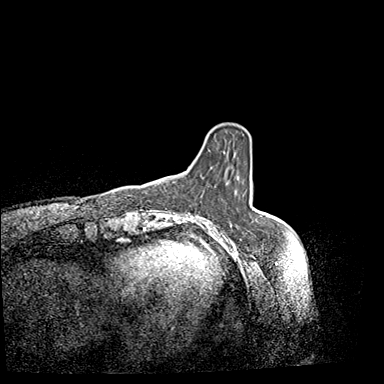
[im 48/144]
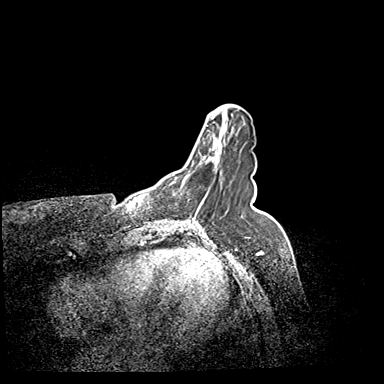
[im 72/144]
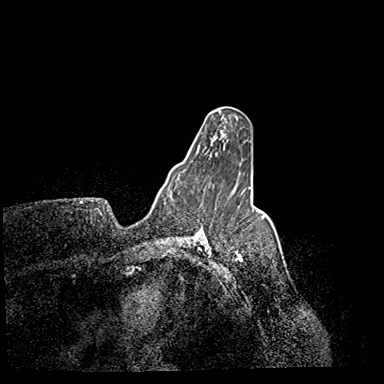
[im 96/144]
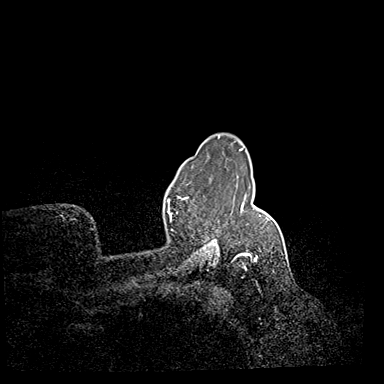
[im 120/144]
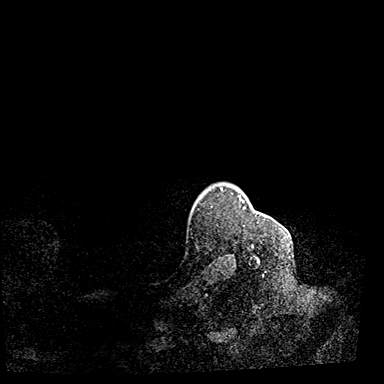
[im 144/144]
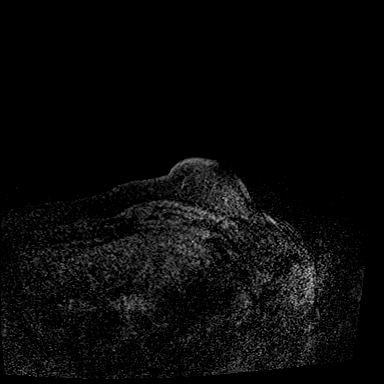

[Series 7: dynamic post 4min_sub · axial · 1.3mm · 0.73mm/px · z∈[-67,+56]mm · 5 of 144 slices shown]
[im 1/144]
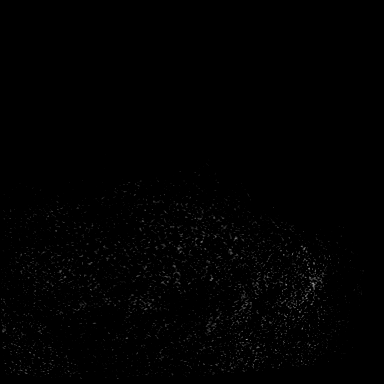
[im 24/144]
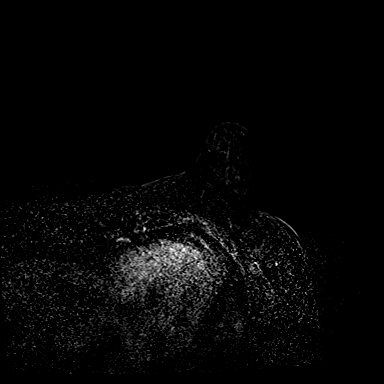
[im 48/144]
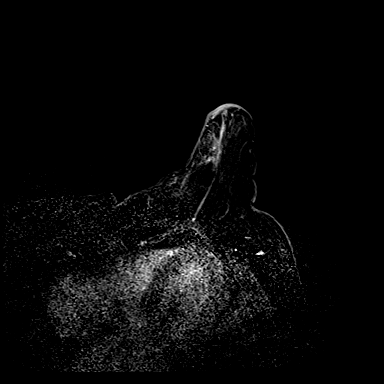
[im 72/144]
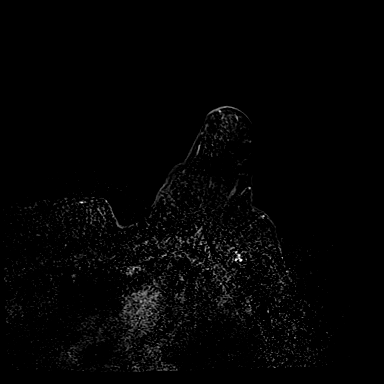
[im 96/144]
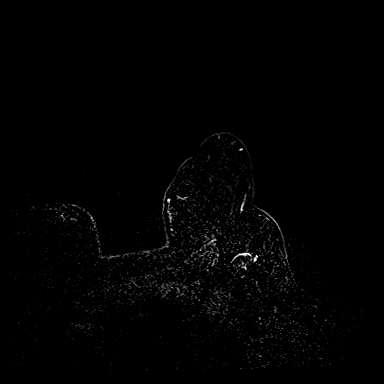

[32 of 48 positions shown; findings below may reference images not displayed]

FINDINGS: I met with the patient, and we discussed the procedure of MRI guided
biopsy, including risks, benefits, and alternatives. Specifically,
we discussed the risks of infection, bleeding, tissue injury, clip
migration, and inadequate sampling. Informed, written consent was
given. The usual time out protocol was performed immediately prior
to the procedure.

Using sterile technique, 1% Lidocaine, MRI guidance, and a 9 gauge
vacuum assisted device, biopsy was performed of the anterior and
posterior extent of the left breast non-mass enhancement using a
lateral approach. At the conclusion of the procedure, tissue marker
clips were deployed at both biopsy sites. Follow-up 2-view mammogram
was performed and dictated separately.
IMPRESSION: MRI guided biopsy of the anterior and posterior extent of the non
mass enhancement in the left breast. No apparent complications.

ADDENDUM:
Pathology revealed DENSE FIBROSIS WITH DILATED DUCTS of the Left
breast, anterior, (cylinder clip). Papillomatous fragments are not
seen. This was found to be concordant by Dr. NOMASIBULELE.

Pathology revealed DILATED DUCTS WITH PERIDUCTAL FIBROSIS AND FOCAL
DUCT RUPTURE of the Left breast, posterior, (dog bone clip).
Papillomatous fragments are not seen. This was found to be
concordant by Dr. NOMASIBULELE.

Pathology results were discussed with the patient by telephone. The
patient reported doing well after the biopsies with tenderness at
the sites. Post biopsy instructions and care were reviewed and
questions were answered. The patient was encouraged to call The
direct phone number was provided.

The patient was instructed to return for a bilateral breast MRI in 6
months, per protocol, and to continue with annual screening
mammography, due in [DATE].

Pathology results reported by NOMASIBULELE, RN on [DATE].

*** End of Addendum ***
FINDINGS: I met with the patient, and we discussed the procedure of MRI guided
biopsy, including risks, benefits, and alternatives. Specifically,
we discussed the risks of infection, bleeding, tissue injury, clip
migration, and inadequate sampling. Informed, written consent was
given. The usual time out protocol was performed immediately prior
to the procedure.

Using sterile technique, 1% Lidocaine, MRI guidance, and a 9 gauge
vacuum assisted device, biopsy was performed of the anterior and
posterior extent of the left breast non-mass enhancement using a
lateral approach. At the conclusion of the procedure, tissue marker
clips were deployed at both biopsy sites. Follow-up 2-view mammogram
was performed and dictated separately.
IMPRESSION: MRI guided biopsy of the anterior and posterior extent of the non
mass enhancement in the left breast. No apparent complications.

## 2021-04-21 ENCOUNTER — Ambulatory Visit: Payer: Medicare Other | Admitting: Family Medicine

## 2021-04-28 ENCOUNTER — Other Ambulatory Visit: Payer: Self-pay | Admitting: Family Medicine

## 2021-04-28 DIAGNOSIS — E785 Hyperlipidemia, unspecified: Secondary | ICD-10-CM

## 2021-04-28 DIAGNOSIS — I10 Essential (primary) hypertension: Secondary | ICD-10-CM

## 2021-05-10 ENCOUNTER — Ambulatory Visit (INDEPENDENT_AMBULATORY_CARE_PROVIDER_SITE_OTHER): Payer: Medicare Other

## 2021-05-10 VITALS — Ht 61.0 in | Wt 139.0 lb

## 2021-05-10 DIAGNOSIS — Z0000 Encounter for general adult medical examination without abnormal findings: Secondary | ICD-10-CM | POA: Diagnosis not present

## 2021-05-10 NOTE — Patient Instructions (Signed)
Melissa Carpenter , Thank you for taking time to come for your Medicare Wellness Visit. I appreciate your ongoing commitment to your health goals. Please review the following plan we discussed and let me know if I can assist you in the future.   Screening recommendations/referrals: Colonoscopy: Done 10/08/2017 - no repeat required Mammogram: Done 01/20/2021 - Repeat annually Bone Density: Done 09/22/2019 - Repeat every 2 years Recommended yearly ophthalmology/optometry visit for glaucoma screening and checkup Recommended yearly dental visit for hygiene and checkup  Vaccinations: Influenza vaccine: Done 05/20/2020 - Repeat annually *due Pneumococcal vaccine: Done 09/14/2004 & 07/30/2013 Tdap vaccine: Done 05/15/2011 - Repeat in 10 years  *due Shingles vaccine: First dose done 02/09/2021 - get second dose 2-6 months later   Covid-19: Done 10/08/19, 11/05/19, 06/29/20, & 04/12/2021  Advanced directives: in chart  Conditions/risks identified: Aim for 30 minutes of exercise or brisk walking each day, drink 6-8 glasses of water and eat lots of fruits and vegetables.   Next appointment: Follow up in one year for your annual wellness visit    Preventive Care 65 Years and Older, Female Preventive care refers to lifestyle choices and visits with your health care provider that can promote health and wellness. What does preventive care include? A yearly physical exam. This is also called an annual well check. Dental exams once or twice a year. Routine eye exams. Ask your health care provider how often you should have your eyes checked. Personal lifestyle choices, including: Daily care of your teeth and gums. Regular physical activity. Eating a healthy diet. Avoiding tobacco and drug use. Limiting alcohol use. Practicing safe sex. Taking low-dose aspirin every day. Taking vitamin and mineral supplements as recommended by your health care provider. What happens during an annual well check? The services and  screenings done by your health care provider during your annual well check will depend on your age, overall health, lifestyle risk factors, and family history of disease. Counseling  Your health care provider may ask you questions about your: Alcohol use. Tobacco use. Drug use. Emotional well-being. Home and relationship well-being. Sexual activity. Eating habits. History of falls. Memory and ability to understand (cognition). Work and work Statistician. Reproductive health. Screening  You may have the following tests or measurements: Height, weight, and BMI. Blood pressure. Lipid and cholesterol levels. These may be checked every 5 years, or more frequently if you are over 58 years old. Skin check. Lung cancer screening. You may have this screening every year starting at age 5 if you have a 30-pack-year history of smoking and currently smoke or have quit within the past 15 years. Fecal occult blood test (FOBT) of the stool. You may have this test every year starting at age 76. Flexible sigmoidoscopy or colonoscopy. You may have a sigmoidoscopy every 5 years or a colonoscopy every 10 years starting at age 32. Hepatitis C blood test. Hepatitis B blood test. Sexually transmitted disease (STD) testing. Diabetes screening. This is done by checking your blood sugar (glucose) after you have not eaten for a while (fasting). You may have this done every 1-3 years. Bone density scan. This is done to screen for osteoporosis. You may have this done starting at age 58. Mammogram. This may be done every 1-2 years. Talk to your health care provider about how often you should have regular mammograms. Talk with your health care provider about your test results, treatment options, and if necessary, the need for more tests. Vaccines  Your health care provider may recommend certain  vaccines, such as: Influenza vaccine. This is recommended every year. Tetanus, diphtheria, and acellular pertussis (Tdap,  Td) vaccine. You may need a Td booster every 10 years. Zoster vaccine. You may need this after age 68. Pneumococcal 13-valent conjugate (PCV13) vaccine. One dose is recommended after age 55. Pneumococcal polysaccharide (PPSV23) vaccine. One dose is recommended after age 10. Talk to your health care provider about which screenings and vaccines you need and how often you need them. This information is not intended to replace advice given to you by your health care provider. Make sure you discuss any questions you have with your health care provider. Document Released: 08/27/2015 Document Revised: 04/19/2016 Document Reviewed: 06/01/2015 Elsevier Interactive Patient Education  2017 [place] Prevention in the Home Falls can cause injuries. They can happen to people of all ages. There are many things you can do to make your home safe and to help prevent falls. What can I do on the outside of my home? Regularly fix the edges of walkways and driveways and fix any cracks. Remove anything that might make you trip as you walk through a door, such as a raised step or threshold. Trim any bushes or trees on the path to your home. Use bright outdoor lighting. Clear any walking paths of anything that might make someone trip, such as rocks or tools. Regularly check to see if handrails are loose or broken. Make sure that both sides of any steps have handrails. Any raised decks and porches should have guardrails on the edges. Have any leaves, snow, or ice cleared regularly. Use sand or salt on walking paths during winter. Clean up any spills in your garage right away. This includes oil or grease spills. What can I do in the bathroom? Use night lights. Install grab bars by the toilet and in the tub and shower. Do not use towel bars as grab bars. Use non-skid mats or decals in the tub or shower. If you need to sit down in the shower, use a plastic, non-slip stool. Keep the floor dry. Clean up any  water that spills on the floor as soon as it happens. Remove soap buildup in the tub or shower regularly. Attach bath mats securely with double-sided non-slip rug tape. Do not have throw rugs and other things on the floor that can make you trip. What can I do in the bedroom? Use night lights. Make sure that you have a light by your bed that is easy to reach. Do not use any sheets or blankets that are too big for your bed. They should not hang down onto the floor. Have a firm chair that has side arms. You can use this for support while you get dressed. Do not have throw rugs and other things on the floor that can make you trip. What can I do in the kitchen? Clean up any spills right away. Avoid walking on wet floors. Keep items that you use a lot in easy-to-reach places. If you need to reach something above you, use a strong step stool that has a grab bar. Keep electrical cords out of the way. Do not use floor polish or wax that makes floors slippery. If you must use wax, use non-skid floor wax. Do not have throw rugs and other things on the floor that can make you trip. What can I do with my stairs? Do not leave any items on the stairs. Make sure that there are handrails on both sides of the stairs  and use them. Fix handrails that are broken or loose. Make sure that handrails are as long as the stairways. Check any carpeting to make sure that it is firmly attached to the stairs. Fix any carpet that is loose or worn. Avoid having throw rugs at the top or bottom of the stairs. If you do have throw rugs, attach them to the floor with carpet tape. Make sure that you have a light switch at the top of the stairs and the bottom of the stairs. If you do not have them, ask someone to add them for you. What else can I do to help prevent falls? Wear shoes that: Do not have high heels. Have rubber bottoms. Are comfortable and fit you well. Are closed at the toe. Do not wear sandals. If you use a  stepladder: Make sure that it is fully opened. Do not climb a closed stepladder. Make sure that both sides of the stepladder are locked into place. Ask someone to hold it for you, if possible. Clearly mark and make sure that you can see: Any grab bars or handrails. First and last steps. Where the edge of each step is. Use tools that help you move around (mobility aids) if they are needed. These include: Canes. Walkers. Scooters. Crutches. Turn on the lights when you go into a dark area. Replace any light bulbs as soon as they burn out. Set up your furniture so you have a clear path. Avoid moving your furniture around. If any of your floors are uneven, fix them. If there are any pets around you, be aware of where they are. Review your medicines with your doctor. Some medicines can make you feel dizzy. This can increase your chance of falling. Ask your doctor what other things that you can do to help prevent falls. This information is not intended to replace advice given to you by your health care provider. Make sure you discuss any questions you have with your health care provider. Document Released: 05/27/2009 Document Revised: 01/06/2016 Document Reviewed: 09/04/2014 Elsevier Interactive Patient Education  2017 [employer].

## 2021-05-10 NOTE — Progress Notes (Signed)
Subjective:   Melissa Carpenter is a 87 y.o. female who presents for Medicare Annual (Subsequent) preventive examination.  Virtual Visit via Telephone Note  I connected with  [name] on 05/10/21 at  9:00 AM EDT by telephone and verified that I am speaking with the correct person using two identifiers.  Location: Patient: Home Provider: [facility] Persons participating in the virtual visit: patient/Nurse Health Advisor   I discussed the limitations, risks, security and privacy concerns of performing an evaluation and management service by telephone and the availability of in person appointments. The patient expressed understanding and agreed to proceed.  Interactive audio and video telecommunications were attempted between this nurse and patient, however failed, due to patient having technical difficulties OR patient did not have access to video capability.  We continued and completed visit with audio only.  Some vital signs may be absent or patient reported.   Aerik Polan E Keslie Gritz, LPN   Review of Systems     Cardiac Risk Factors include: advanced age (>29men, >23 women);dyslipidemia;hypertension;sedentary lifestyle;Other (see comment), Risk factor comments: mild aortic stenosis, prediabetes     Objective:    Today's Vitals   05/10/21 0905  Weight: 139 lb (63 kg)  Height: 5\' 1"  (1.549 m)   Body mass index is 26.26 kg/m.  Advanced Directives 05/10/2021 06/11/2018 03/08/2016 05/28/2015 03/08/2015  Does Patient Have a Medical Advance Directive? No;Yes Yes Yes Yes Yes  Type of Paramedic of [place];Living will [place];Living will [place];Living will [place];Living will [place];Living will  Does patient want to make changes to medical advance directive? - No - Patient declined No - Patient declined - -  Copy of [place] in Chart? Yes - validated most recent copy scanned  in chart (See row information) No - copy requested Yes No - copy requested No - copy requested    Current Medications (verified) Outpatient Encounter Medications as of 05/10/2021  Medication Sig   Accu-Chek Softclix Lancets lancets Use to check blood sugars daily   amLODipine-atorvastatin (CADUET) 10-40 MG tablet TAKE 1 TABLET BY MOUTH EVERY DAY   aspirin 81 MG tablet Take 81 mg by mouth daily.   Calcium Carbonate-Vitamin D (CALTRATE 600+D) 600-400 MG-UNIT per tablet Take 1 tablet by mouth daily.   cephALEXin (KEFLEX) 500 MG capsule Take 1 capsule (500 mg total) by mouth 4 (four) times daily.   cholecalciferol (VITAMIN D) 400 UNITS TABS Take 1,000 Units by mouth.   fish oil-omega-3 fatty acids 1000 MG capsule Take 1 g by mouth daily.   fluticasone (FLONASE) 50 MCG/ACT nasal spray SPRAY 2 SPRAYS INTO EACH NOSTRIL EVERY DAY   glucose blood test strip Check blood glucose QD and PRN DxE11.9   metoprolol succinate (TOPROL-XL) 100 MG 24 hr tablet TAKE 1 TABLET BY MOUTH DAILY. TAKE WITH OR IMMEDIATELY FOLLOWING A MEAL.   Multiple Vitamin (MULTIVITAMIN) tablet Take 1 tablet by mouth daily.   telmisartan (MICARDIS) 80 MG tablet TAKE 1 TABLET BY MOUTH EVERY EVENING   triamcinolone cream (KENALOG) 0.1 % Apply 1 application topically 2 (two) times daily.   hydrALAZINE (APRESOLINE) 25 MG tablet TAKE 1 TABLET (25 MG TOTAL) BY MOUTH EVERY EVENING. TAKE WITH EVENING MEDICATIONS   No facility-administered encounter medications on file as of 05/10/2021.    Allergies (verified) Codeine, Forteo [parathyroid hormone (recomb)], Raloxifene, Morphine, Penicillins, Sulfites, and Sulfonamide derivatives   History: Past Medical History:  Diagnosis Date   Allergy  Aortic insufficiency    Mild, echo, December, 2009   Arthritis    Carotid artery disease ([place])    Cataract    Cellulitis    Chest pain    Catheterization, 2003, no significant CAD   Diastolic dysfunction    Mild diastolic dysfunction, echo,  December, 2012   DJD (degenerative joint disease)    Dyslipidemia    Edema    November, 2012   GERD (gastroesophageal reflux disease)    Hyperlipidemia    Hypertension    Mitral valve prolapse    Echo, 2009, very mild intermittent prolapse of the posterior leaflet, no MR   Osteoporosis    Prediabetes    Tachycardia    Nighttime tachycardia, February, 2014   Thyroid nodule    Past Surgical History:  Procedure Laterality Date   ABDOMINAL HYSTERECTOMY  1964   APPENDECTOMY  1946   BIOPSY THYROID Left    CARDIAC CATHETERIZATION  sept 2003   no significant cad   CATARACT EXTRACTION, BILATERAL     COLONOSCOPY     NASAL SINUS SURGERY     TONSILLECTOMY     Family History  Problem Relation Age of Onset   Heart attack Mother    Hypertension Mother    Throat cancer Mother        THROAT / VOCAL CORD   Heart disease Mother    Hypertension Father    Heart disease Father    Kidney disease Father        failure   Bone cancer Sister    Stroke Sister    Bone cancer Grandchild        in rib cage   Hypertension Sister    Metabolic syndrome Sister        pre diabetes   GER disease Sister    Heart disease Sister        CAD   Heart attack Brother    Cirrhosis Brother    Breast cancer Sister    Bone cancer Sister    Hyperlipidemia Sister    Heart attack Sister    Stroke Son    Colitis Neg Hx    Esophageal cancer Neg Hx    Stomach cancer Neg Hx    Rectal cancer Neg Hx    Social History   Socioeconomic History   Marital status: Married    Spouse name: Thersa [employer] account planner)   Number of children: 4   Years of education: Not on file   Highest education level: Not on file  Occupational History   Occupation: retired    Fish farm manager: [employer]  Tobacco Use   Smoking status: Never   Smokeless tobacco: Never  Vaping Use   Vaping Use: Never used  Substance and Sexual Activity   Alcohol use: No    Alcohol/week: 0.0 standard drinks   Drug use: No   Sexual activity: Never   Other Topics Concern   Not on file  Social History Narrative   Lives in 2 story home with her husband   Children live nearby   Social Determinants of Health   Financial Resource Strain: Low Risk    Difficulty of Paying Living Expenses: Not very hard  Food Insecurity: No Food Insecurity   Worried About Charity fundraiser in the Last Year: Never true   Ran Out of Food in the Last Year: Never true  Transportation Needs: No Transportation Needs   Lack of Transportation (Medical): No   Lack of Transportation (Non-Medical): No  Physical Activity: Insufficiently Active   Days of Exercise per Week: 7 days   Minutes of Exercise per Session: 10 min  Stress: No Stress Concern Present   Feeling of Stress : Not at all  Social Connections: Moderately Isolated   Frequency of Communication with Friends and Family: More than three times a week   Frequency of Social Gatherings with Friends and Family: More than three times a week   Attends Religious Services: Never   Marine scientist or Organizations: No   Attends Music therapist: Never   Marital Status: Married    Tobacco Counseling Counseling given: Not Answered   Clinical Intake:  Pre-visit preparation completed: Yes  Pain : No/denies pain     BMI - recorded: 26.26 Nutritional Status: BMI 25 -29 Overweight Nutritional Risks: None Diabetes: No  How often do you need to have someone help you when you read instructions, pamphlets, or other written materials from your doctor or pharmacy?: 1 - Never  Diabetic? no  Interpreter Needed?: No  Information entered by :: Karthika Glasper, LPN   Activities of Daily Living In your present state of health, do you have any difficulty performing the following activities: 05/10/2021  Hearing? Y  Comment mild  Vision? N  Difficulty concentrating or making decisions? N  Walking or climbing stairs? Y  Comment bothers her hips  Dressing or bathing? N  Doing errands,  shopping? N  Preparing Food and eating ? N  Using the Toilet? N  In the past six months, have you accidently leaked urine? Y  Comment wears pads for protection  Do you have problems with loss of bowel control? N  Managing your Medications? N  Managing your Finances? N  Housekeeping or managing your Housekeeping? N  Some recent data might be hidden    Patient Care Team: [name], [name], MD as PCP - General (Family Medicine) Paralee Cancel, MD as Consulting Physician (Orthopedic Surgery) Minus Breeding, MD as Consulting Physician (Cardiology) Angelia Mould, MD as Consulting Physician (Vascular Surgery)  Indicate any recent Medical Services you may have received from other than [facility] providers in the past year (date may be approximate).     Assessment:   This is a routine wellness examination for [facility]  Hearing/Vision screen Hearing Screening - Comments:: C/o mild hearing difficulties - declines hearing aids Vision Screening - Comments:: Wears rx glasses - up to date with annual eye exams with Dr Katy Fitch  Dietary issues and exercise activities discussed: Current Exercise Habits: Home exercise routine, Type of exercise: walking, Time (Minutes): 10, Frequency (Times/Week): 7, Weekly Exercise (Minutes/Week): 70, Intensity: Mild, Exercise limited by: orthopedic condition(s);cardiac condition(s)   Goals Addressed             This Visit's Progress    Prevent falls   On track      Depression Screen PHQ 2/9 Scores 05/10/2021 03/23/2021 01/19/2021 09/20/2020 05/20/2020 04/07/2020 01/21/2020  PHQ - 2 Score 0 0 0 0 0 0 0    Fall Risk Fall Risk  05/10/2021 03/23/2021 01/19/2021 09/20/2020 05/20/2020  Falls in the past year? 0 0 0 0 0  Number falls in past yr: 0 - - - -  Injury with Fall? 0 - - - -  Risk Factor Category  - - - - -  Risk for fall due to : Orthopedic patient - - - -  Follow up Falls prevention discussed - - - -    [place]  HOME:  Any  stairs in or around the home? Yes  If so, are there any without handrails? No  Home free of loose throw rugs in walkways, pet beds, electrical cords, etc? Yes  Adequate lighting in your home to reduce risk of falls? Yes   ASSISTIVE DEVICES UTILIZED TO PREVENT FALLS:  Life alert? No  Use of a cane, walker or w/c? No  Grab bars in the bathroom? No  Shower chair or bench in shower? No  Elevated toilet seat or a handicapped toilet? No   TIMED UP AND GO:  Was the test performed? No . Telephonic visit  Cognitive Function: Normal cognitive status assessed by direct observation by this Nurse Health Advisor. No abnormalities found.    MMSE - Mini Mental State Exam 06/11/2018 07/13/2016 03/08/2016 03/08/2015  Orientation to time 5 5 5 5   Orientation to Place 5 5 5 5   Registration 3 3 3 3   Attention/ Calculation 5 5 5 5   Recall 3 3 3 3   Language- name 2 objects 2 2 2 2   Language- repeat 1 1 1 1   Language- follow 3 step command 3 3 3 3   Language- read & follow direction 1 1 1 1   Write a sentence 1 1 1 1   Copy design 1 1 1 1   Total score 30 30 30 30         Immunizations Immunization History  Administered Date(s) Administered   Fluad Quad(high Dose 65+) 05/29/2019, 05/20/2020   Influenza, High Dose Seasonal PF 05/08/2016, 06/12/2017, 05/28/2018   Influenza,inj,Quad PF,6+ Mos 05/26/2013, 05/25/2014, 06/08/2015   Moderna Sars-Covid-2 Vaccination 10/08/2019, 11/05/2019, 06/29/2020, 04/12/2021   Pneumococcal Conjugate-13 07/30/2013   Pneumococcal Polysaccharide-23 09/14/2004   Tdap 05/15/2011   Zoster Recombinat (Shingrix) 02/09/2021    TDAP status: Due, Education has been provided regarding the importance of this vaccine. Advised may receive this vaccine at local pharmacy or Health Dept. Aware to provide a copy of the vaccination record if obtained from local pharmacy or Health Dept. Verbalized acceptance and understanding.  Flu Vaccine status: Due, Education has been provided  regarding the importance of this vaccine. Advised may receive this vaccine at local pharmacy or Health Dept. Aware to provide a copy of the vaccination record if obtained from local pharmacy or Health Dept. Verbalized acceptance and understanding.  Pneumococcal vaccine status: Up to date  Covid-19 vaccine status: Completed vaccines  Qualifies for Shingles Vaccine? Yes   Zostavax completed Yes   Shingrix Completed?: No.    Education has been provided regarding the importance of this vaccine. Patient has been advised to call insurance company to determine out of pocket expense if they have not yet received this vaccine. Advised may also receive vaccine at local pharmacy or Health Dept. Verbalized acceptance and understanding.  Screening Tests Health Maintenance  Topic Date Due   INFLUENZA VACCINE  03/14/2021   Zoster Vaccines- Shingrix (2 of 2) 04/06/2021   TETANUS/TDAP  05/14/2021   DEXA SCAN  09/21/2021   MAMMOGRAM  01/20/2022   COVID-19 Vaccine  Completed   HPV VACCINES  Aged Out    Health Maintenance  Health Maintenance Due  Topic Date Due   INFLUENZA VACCINE  03/14/2021   Zoster Vaccines- Shingrix (2 of 2) 04/06/2021    Colorectal cancer screening: No longer required.   Mammogram status: Completed 01/20/2021. Repeat every year  Bone Density status: Completed 09/22/2019. Results reflect: Bone density results: OSTEOPENIA. Repeat every 2 years.  Lung Cancer Screening: (Low Dose CT Chest recommended if  Age 70-80 years, 30 pack-year currently smoking OR have quit w/in 15years.) does not qualify.   Additional Screening:  Hepatitis C Screening: does not qualify  Vision Screening: Recommended annual ophthalmology exams for early detection of glaucoma and other disorders of the eye. Is the patient up to date with their annual eye exam?  Yes  Who is the provider or what is the name of the office in which the patient attends annual eye exams? [facility] If pt is not established with a  provider, would they like to be referred to a provider to establish care? No .   Dental Screening: Recommended annual dental exams for proper oral hygiene  Community Resource Referral / Chronic Care Management: CRR required this visit?  No   CCM required this visit?  No      Plan:     I have personally reviewed and noted the following in the patient's chart:   Medical and social history Use of alcohol, tobacco or illicit drugs  Current medications and supplements including opioid prescriptions.  Functional ability and status Nutritional status Physical activity Advanced directives List of other physicians Hospitalizations, surgeries, and ER visits in previous 12 months Vitals Screenings to include cognitive, depression, and falls Referrals and appointments  In addition, I have reviewed and discussed with patient certain preventive protocols, quality metrics, and best practice recommendations. A written personalized care plan for preventive services as well as general preventive health recommendations were provided to patient.     Sandrea Hammond, LPN   1/60/7371   Nurse Notes: None

## 2021-05-16 ENCOUNTER — Other Ambulatory Visit: Payer: Self-pay

## 2021-05-16 ENCOUNTER — Other Ambulatory Visit: Payer: Medicare Other

## 2021-05-16 DIAGNOSIS — I10 Essential (primary) hypertension: Secondary | ICD-10-CM

## 2021-05-16 DIAGNOSIS — R7303 Prediabetes: Secondary | ICD-10-CM

## 2021-05-16 LAB — BAYER DCA HB A1C WAIVED: HB A1C (BAYER DCA - WAIVED): 5.2 % (ref 4.8–5.6)

## 2021-05-17 LAB — CMP14+EGFR
ALT: 33 IU/L — ABNORMAL HIGH (ref 0–32)
AST: 34 IU/L (ref 0–40)
Albumin/Globulin Ratio: 1.6 (ref 1.2–2.2)
Albumin: 4.5 g/dL (ref 3.6–4.6)
Alkaline Phosphatase: 125 IU/L — ABNORMAL HIGH (ref 44–121)
BUN/Creatinine Ratio: 21 (ref 12–28)
BUN: 12 mg/dL (ref 8–27)
Bilirubin Total: 0.4 mg/dL (ref 0.0–1.2)
CO2: 20 mmol/L (ref 20–29)
Calcium: 9.5 mg/dL (ref 8.7–10.3)
Chloride: 105 mmol/L (ref 96–106)
Creatinine, Ser: 0.58 mg/dL (ref 0.57–1.00)
Globulin, Total: 2.9 g/dL (ref 1.5–4.5)
Glucose: 96 mg/dL (ref 70–99)
Potassium: 4.1 mmol/L (ref 3.5–5.2)
Sodium: 141 mmol/L (ref 134–144)
Total Protein: 7.4 g/dL (ref 6.0–8.5)
eGFR: 89 mL/min/{1.73_m2} (ref >59)

## 2021-05-17 LAB — LIPID PANEL
Chol/HDL Ratio: 3.4 ratio (ref 0.0–4.4)
Cholesterol, Total: 134 mg/dL (ref 100–199)
HDL: 39 mg/dL — ABNORMAL LOW (ref >39)
LDL Chol Calc (NIH): 67 mg/dL (ref 0–99)
Triglycerides: 167 mg/dL — ABNORMAL HIGH (ref 0–149)
VLDL Cholesterol Cal: 28 mg/dL (ref 5–40)

## 2021-05-17 LAB — CBC WITH DIFFERENTIAL/PLATELET
Basophils Absolute: 0 10*3/uL (ref 0.0–0.2)
Basos: 1 %
EOS (ABSOLUTE): 0.1 10*3/uL (ref 0.0–0.4)
Eos: 2 %
Hematocrit: 39.8 % (ref 34.0–46.6)
Hemoglobin: 13.4 g/dL (ref 11.1–15.9)
Immature Grans (Abs): 0 10*3/uL (ref 0.0–0.1)
Immature Granulocytes: 0 %
Lymphocytes Absolute: 1.4 10*3/uL (ref 0.7–3.1)
Lymphs: 27 %
MCH: 30.9 pg (ref 26.6–33.0)
MCHC: 33.7 g/dL (ref 31.5–35.7)
MCV: 92 fL (ref 79–97)
Monocytes Absolute: 0.5 10*3/uL (ref 0.1–0.9)
Monocytes: 9 %
Neutrophils Absolute: 3.2 10*3/uL (ref 1.4–7.0)
Neutrophils: 61 %
Platelets: 195 10*3/uL (ref 150–450)
RBC: 4.34 x10E6/uL (ref 3.77–5.28)
RDW: 13 % (ref 11.7–15.4)
WBC: 5.3 10*3/uL (ref 3.4–10.8)

## 2021-05-17 LAB — TSH: TSH: 1.61 u[IU]/mL (ref 0.450–4.500)

## 2021-05-18 ENCOUNTER — Other Ambulatory Visit: Payer: Self-pay

## 2021-05-18 ENCOUNTER — Encounter: Payer: Self-pay | Admitting: Family Medicine

## 2021-05-18 ENCOUNTER — Ambulatory Visit (INDEPENDENT_AMBULATORY_CARE_PROVIDER_SITE_OTHER): Payer: Medicare Other | Admitting: Family Medicine

## 2021-05-18 VITALS — BP 147/61 | HR 62 | Wt 139.0 lb

## 2021-05-18 DIAGNOSIS — Z0000 Encounter for general adult medical examination without abnormal findings: Secondary | ICD-10-CM

## 2021-05-18 DIAGNOSIS — R102 Pelvic and perineal pain: Secondary | ICD-10-CM | POA: Diagnosis not present

## 2021-05-18 DIAGNOSIS — Z23 Encounter for immunization: Secondary | ICD-10-CM

## 2021-05-18 DIAGNOSIS — Z0001 Encounter for general adult medical examination with abnormal findings: Secondary | ICD-10-CM | POA: Diagnosis not present

## 2021-05-18 DIAGNOSIS — I10 Essential (primary) hypertension: Secondary | ICD-10-CM

## 2021-05-18 DIAGNOSIS — E785 Hyperlipidemia, unspecified: Secondary | ICD-10-CM | POA: Diagnosis not present

## 2021-05-18 DIAGNOSIS — R7303 Prediabetes: Secondary | ICD-10-CM

## 2021-05-18 DIAGNOSIS — N63 Unspecified lump in unspecified breast: Secondary | ICD-10-CM | POA: Diagnosis not present

## 2021-05-18 LAB — MICROSCOPIC EXAMINATION
Bacteria, UA: NONE SEEN
RBC, Urine: NONE SEEN /hpf (ref 0–2)
Renal Epithel, UA: NONE SEEN /hpf

## 2021-05-18 LAB — URINALYSIS, COMPLETE
Bilirubin, UA: NEGATIVE
Glucose, UA: NEGATIVE
Ketones, UA: NEGATIVE
Nitrite, UA: NEGATIVE
Protein,UA: NEGATIVE
RBC, UA: NEGATIVE
Specific Gravity, UA: 1.01 (ref 1.005–1.030)
Urobilinogen, Ur: 0.2 mg/dL (ref 0.2–1.0)
pH, UA: 6.5 (ref 5.0–7.5)

## 2021-05-18 MED ORDER — METOPROLOL SUCCINATE ER 100 MG PO TB24: 100.0000 mg | ORAL_TABLET | Freq: Every day | ORAL | 3 refills | Status: DC

## 2021-05-18 MED ORDER — AMLODIPINE-ATORVASTATIN 10-40 MG PO TABS: 1.0000 | ORAL_TABLET | Freq: Every day | ORAL | 3 refills | Status: DC

## 2021-05-18 NOTE — Progress Notes (Signed)
BP (!) 147/61   Pulse 62   Wt 139 lb (63 kg)   SpO2 94%   BMI 26.26 kg/m    Subjective:   Patient ID: Melissa Carpenter, female    DOB: 02-Dec-1933, 88 y.o.   MRN: [id number]  HPI: Melissa Carpenter is a 87 y.o. female presenting on 05/18/2021 for Medical Management of Chronic Issues (CPE with pap and breast exam)   HPI Physical exam Patient is coming in for repeat physical exam.  She just had mammogram and biopsies couple months ago on her left breast because of inverted nipple and discharge and tenderness and a nodule just behind her areola.  They did not find anything that necessarily was cancerous but wanted her follow-up with an MRI breast in 6 months which would be in February of this coming year.  She is complaining of some pelvic discomfort especially on the right side especially when she stands up more.  She feels like something is dropping out of her.  She has had some bladder prolapse and she does have urinary leakage from it.  Hypertension Patient is currently on telmisartan and amlodipine and hydralazine and metoprolol, and their blood pressure today is 147/61. Patient denies any lightheadedness or dizziness. Patient denies headaches, blurred vision, chest pains, shortness of breath, or weakness. Denies any side effects from medication and is content with current medication.   Hyperlipidemia Patient is coming in for recheck of his hyperlipidemia. The patient is currently taking atorvastatin. They deny any issues with myalgias or history of liver damage from it. They deny any focal numbness or weakness or chest pain.   Prediabetes  patient comes in today for recheck of his diabetes. Patient has been currently taking no medication, has been diet controlled. Patient is currently on an ACE inhibitor/ARB. Patient has not seen an ophthalmologist this year. Patient denies any issues with their feet. The symptom started onset as an adult hypertension and hyperlipidemia ARE RELATED TO DM   Relevant  past medical, surgical, family and social history reviewed and updated as indicated. Interim medical history since our last visit reviewed. Allergies and medications reviewed and updated.  Review of Systems  Constitutional:  Negative for chills and fever.  Eyes:  Negative for visual disturbance.  Respiratory:  Negative for chest tightness and shortness of breath.   Cardiovascular:  Negative for chest pain and leg swelling.  Musculoskeletal:  Negative for back pain and gait problem.  Skin:  Negative for rash.  Neurological:  Negative for dizziness, light-headedness and headaches.  Psychiatric/Behavioral:  Negative for agitation, behavioral problems, self-injury, sleep disturbance and suicidal ideas.   All other systems reviewed and are negative.  Per HPI unless specifically indicated above   Allergies as of 05/18/2021       Reactions   Codeine Hives   Forteo [parathyroid Hormone (recomb)] Other (See Comments)   Weakness and calcium increase   Raloxifene Other (See Comments)   Eye problems Other reaction(s): Other (See Comments) Eye problems Eye problems   Morphine Rash   Penicillins Rash   Sulfites Rash   Sulfonamide Derivatives Rash        Medication List        Accurate as of May 18, 2021  3:04 PM. If you have any questions, ask your nurse or doctor.          Accu-Chek Softclix Lancets lancets Use to check blood sugars daily   amLODipine-atorvastatin 10-40 MG tablet Commonly known as: CADUET Take 1 tablet by mouth  daily.   aspirin 81 MG tablet Take 81 mg by mouth daily.   Calcium Carbonate-Vitamin D 600-400 MG-UNIT tablet Commonly known as: Caltrate 600+D Take 1 tablet by mouth daily.   cephALEXin 500 MG capsule Commonly known as: KEFLEX Take 1 capsule (500 mg total) by mouth 4 (four) times daily.   cholecalciferol 10 MCG (400 UNIT) Tabs tablet Commonly known as: VITAMIN D3 Take 1,000 Units by mouth.   fish oil-omega-3 fatty acids 1000 MG  capsule Take 1 g by mouth daily.   fluticasone 50 MCG/ACT nasal spray Commonly known as: FLONASE SPRAY 2 SPRAYS INTO EACH NOSTRIL EVERY DAY   glucose blood test strip Check blood glucose QD and PRN DxE11.9   hydrALAZINE 25 MG tablet Commonly known as: APRESOLINE TAKE 1 TABLET (25 MG TOTAL) BY MOUTH EVERY EVENING. TAKE WITH EVENING MEDICATIONS   metoprolol succinate 100 MG 24 hr tablet Commonly known as: TOPROL-XL Take 1 tablet (100 mg total) by mouth daily. TAKE WITH OR IMMEDIATELY FOLLOWING A MEAL.   multivitamin tablet Take 1 tablet by mouth daily.   telmisartan 80 MG tablet Commonly known as: MICARDIS TAKE 1 TABLET BY MOUTH EVERY EVENING   triamcinolone cream 0.1 % Commonly known as: KENALOG Apply 1 application topically 2 (two) times daily.         Objective:   BP (!) 147/61   Pulse 62   Wt 139 lb (63 kg)   SpO2 94%   BMI 26.26 kg/m   Wt Readings from Last 3 Encounters:  05/18/21 139 lb (63 kg)  05/10/21 139 lb (63 kg)  03/23/21 139 lb (63 kg)    Physical Exam Vitals and nursing note reviewed.  Constitutional:      General: She is not in acute distress.    Appearance: She is well-developed. She is not diaphoretic.  Eyes:     Conjunctiva/sclera: Conjunctivae normal.  Neck:     Thyroid: No thyromegaly.  Cardiovascular:     Rate and Rhythm: Normal rate and regular rhythm.     Heart sounds: Normal heart sounds. No murmur heard. Pulmonary:     Effort: Pulmonary effort is normal. No respiratory distress.     Breath sounds: Normal breath sounds. No wheezing.  Chest:  Breasts:    Breasts are symmetrical.     Right: No inverted nipple, mass, nipple discharge, skin change or tenderness.     Left: Inverted nipple and mass present. No swelling, nipple discharge, skin change or tenderness.  Abdominal:     General: Bowel sounds are normal. There is no distension.     Palpations: Abdomen is soft.     Tenderness: There is no abdominal tenderness. There is no  guarding or rebound.  Genitourinary:    Exam position: Supine.     Labia:        Right: No rash or lesion.        Left: No rash or lesion.      Vagina: Normal.     Cervix: No cervical motion tenderness, discharge or friability.     Uterus: With uterine prolapse (Significant bladder prolapse). Not deviated, not enlarged, not fixed and not tender.      Adnexa:        Right: No mass or tenderness.         Left: No mass or tenderness.    Musculoskeletal:        General: No swelling or tenderness. Normal range of motion.     Cervical back: Neck supple.  Lymphadenopathy:     Cervical: No cervical adenopathy.  Skin:    General: Skin is warm and dry.     Findings: No rash.  Neurological:     Mental Status: She is alert and oriented to person, place, and time.     Coordination: Coordination normal.  Psychiatric:        Behavior: Behavior normal.      Assessment & Plan:   Problem List Items Addressed This Visit       Cardiovascular and Mediastinum   Essential hypertension (Chronic)   Relevant Medications   metoprolol succinate (TOPROL-XL) 100 MG 24 hr tablet   amLODipine-atorvastatin (CADUET) 10-40 MG tablet   Other Relevant Orders   CMP14+EGFR   TSH     Other   Dyslipidemia (Chronic)   Relevant Medications   amLODipine-atorvastatin (CADUET) 10-40 MG tablet   Other Relevant Orders   Lipid panel   Pre-diabetes   Relevant Orders   Bayer DCA Hb A1c Waived   Other Visit Diagnoses     Physical exam, annual    -  Primary   Relevant Orders   CBC with Differential/Platelet   CMP14+EGFR   Lipid panel   Bayer DCA Hb A1c Waived   TSH   Pelvic pain       Relevant Orders   Urinalysis, Complete   Urine Culture   Breast nodule           Will leave urine, patient does have bladder prolapse, discussed possible pessary versus surgery and she wants to think about it. Follow up plan: Return in about 4 months (around 09/18/2021), or if symptoms worsen or fail to improve, for  Hypertension.  Counseling provided for all of the vaccine components Orders Placed This Encounter  Procedures   Urine Culture   Urinalysis, Complete   CBC with Differential/Platelet   CMP14+EGFR   Lipid panel   Bayer DCA Hb A1c Waived   TSH    Caryl Pina, MD [facility] 05/18/2021, 3:04 PM

## 2021-05-18 NOTE — Patient Instructions (Signed)
Pessary versus bladder prolapse surgery  Repeat MRI of breasts in February 2023

## 2021-05-20 LAB — URINE CULTURE

## 2021-06-01 ENCOUNTER — Ambulatory Visit (INDEPENDENT_AMBULATORY_CARE_PROVIDER_SITE_OTHER): Payer: Medicare Other | Admitting: Family Medicine

## 2021-06-01 ENCOUNTER — Encounter: Payer: Self-pay | Admitting: Family Medicine

## 2021-06-01 ENCOUNTER — Other Ambulatory Visit: Payer: Self-pay

## 2021-06-01 VITALS — BP 144/62 | HR 66 | Ht 61.0 in | Wt 138.0 lb

## 2021-06-01 DIAGNOSIS — N952 Postmenopausal atrophic vaginitis: Secondary | ICD-10-CM

## 2021-06-01 DIAGNOSIS — N8110 Cystocele, unspecified: Secondary | ICD-10-CM | POA: Diagnosis not present

## 2021-06-01 MED ORDER — ESTRADIOL 0.1 MG/GM VA CREA: 1.0000 | TOPICAL_CREAM | Freq: Every day | VAGINAL | 12 refills | Status: DC

## 2021-06-01 NOTE — Progress Notes (Signed)
BP (!) 144/62   Pulse 66   Ht 5\' 1"  (1.549 m)   Wt 138 lb (62.6 kg)   SpO2 96%   BMI 26.07 kg/m    Subjective:   Patient ID: Melissa Carpenter, female    DOB: 1934-06-02, 87 y.o.   MRN: [id number]  HPI: Casimera Gerads is a 87 y.o. female presenting on 06/01/2021 for Bladder Prolapse (Discuss treatment options)   HPI Patient is coming in today again to discuss bladder prolapse options.  We mentioned that in her physical but she did not want to talk about it more then and wanted to come back now now that she is thought about it and discuss the options again.  We discussed doing surgery for the bladder prolapse versus physical therapy versus a pessary and the positives and negatives of each and she would like to think about it more.  She is leaning towards a pessary and we instructed that if she wanted to get sized for a gynecologist would likely do that.  She does says she gets some bladder fullness and sometimes does not feel like she empties completely but does not have recurrent infections.  Patient is having some vaginal irritation and soreness inside the vaginal area itself and it feels like a sharp pain and she does feel like she is very dry and there.  She did not mention it when she was here at the physical but she said it was very sore  Relevant past medical, surgical, family and social history reviewed and updated as indicated. Interim medical history since our last visit reviewed. Allergies and medications reviewed and updated.  Review of Systems  Constitutional:  Negative for chills and fever.  HENT:  Negative for congestion, ear discharge and ear pain.   Eyes:  Negative for redness and visual disturbance.  Respiratory:  Negative for chest tightness and shortness of breath.   Cardiovascular:  Negative for chest pain and leg swelling.  Genitourinary:  Positive for frequency and vaginal pain. Negative for difficulty urinating, dysuria, flank pain, hematuria, pelvic pain, urgency, vaginal  bleeding and vaginal discharge.  Musculoskeletal:  Negative for back pain and gait problem.  Skin:  Negative for rash.  Neurological:  Negative for light-headedness and headaches.  Psychiatric/Behavioral:  Negative for agitation and behavioral problems.   All other systems reviewed and are negative.  Per HPI unless specifically indicated above   Allergies as of 06/01/2021       Reactions   Codeine Hives   Forteo [parathyroid Hormone (recomb)] Other (See Comments)   Weakness and calcium increase   Raloxifene Other (See Comments)   Eye problems Other reaction(s): Other (See Comments) Eye problems Eye problems   Morphine Rash   Penicillins Rash   Sulfites Rash   Sulfonamide Derivatives Rash        Medication List        Accurate as of June 01, 2021 12:29 PM. If you have any questions, ask your nurse or doctor.          Accu-Chek Softclix Lancets lancets Use to check blood sugars daily   amLODipine-atorvastatin 10-40 MG tablet Commonly known as: CADUET Take 1 tablet by mouth daily.   aspirin 81 MG tablet Take 81 mg by mouth daily.   Calcium Carbonate-Vitamin D 600-400 MG-UNIT tablet Commonly known as: Caltrate 600+D Take 1 tablet by mouth daily.   cephALEXin 500 MG capsule Commonly known as: KEFLEX Take 1 capsule (500 mg total) by mouth 4 (four) times daily.  cholecalciferol 10 MCG (400 UNIT) Tabs tablet Commonly known as: VITAMIN D3 Take 1,000 Units by mouth.   estradiol 0.1 MG/GM vaginal cream Commonly known as: ESTRACE Place 1 Applicatorful vaginally at bedtime. Started by: [name] Morena Mckissack, MD   fish oil-omega-3 fatty acids 1000 MG capsule Take 1 g by mouth daily.   fluticasone 50 MCG/ACT nasal spray Commonly known as: FLONASE SPRAY 2 SPRAYS INTO EACH NOSTRIL EVERY DAY   glucose blood test strip Check blood glucose QD and PRN DxE11.9   hydrALAZINE 25 MG tablet Commonly known as: APRESOLINE TAKE 1 TABLET (25 MG TOTAL) BY MOUTH EVERY  EVENING. TAKE WITH EVENING MEDICATIONS   metoprolol succinate 100 MG 24 hr tablet Commonly known as: TOPROL-XL Take 1 tablet (100 mg total) by mouth daily. TAKE WITH OR IMMEDIATELY FOLLOWING A MEAL.   multivitamin tablet Take 1 tablet by mouth daily.   telmisartan 80 MG tablet Commonly known as: MICARDIS TAKE 1 TABLET BY MOUTH EVERY EVENING   triamcinolone cream 0.1 % Commonly known as: KENALOG Apply 1 application topically 2 (two) times daily.         Objective:   BP (!) 144/62   Pulse 66   Ht 5\' 1"  (1.549 m)   Wt 138 lb (62.6 kg)   SpO2 96%   BMI 26.07 kg/m   Wt Readings from Last 3 Encounters:  06/01/21 138 lb (62.6 kg)  05/18/21 139 lb (63 kg)  05/10/21 139 lb (63 kg)    Physical Exam Vitals and nursing note reviewed.  Constitutional:      General: She is not in acute distress.    Appearance: She is well-developed. She is not diaphoretic.  Eyes:     Conjunctiva/sclera: Conjunctivae normal.  Skin:    General: Skin is warm and dry.     Findings: No rash.  Neurological:     Mental Status: She is alert and oriented to person, place, and time.     Coordination: Coordination normal.  Psychiatric:        Behavior: Behavior normal.      Assessment & Plan:   Problem List Items Addressed This Visit   None Visit Diagnoses     Bladder prolapse, female, acquired    -  Primary   Vaginal atrophy           Will try estradiol cream for vaginal dryness, will think more on the options for bladder prolapse and let us know in the future.  If she needs a referral to either gynecology or urology or physical therapy for it then that is an option in the future. Follow up plan: Return if symptoms worsen or fail to improve.  Counseling provided for all of the vaccine components No orders of the defined types were placed in this encounter.   Arville Care, MD [facility] 06/01/2021, 12:29 PM

## 2021-06-17 ENCOUNTER — Ambulatory Visit (INDEPENDENT_AMBULATORY_CARE_PROVIDER_SITE_OTHER): Payer: Medicare Other

## 2021-06-17 DIAGNOSIS — Z23 Encounter for immunization: Secondary | ICD-10-CM

## 2021-06-17 NOTE — Progress Notes (Signed)
Shingles given in Left deltoid without difficulty

## 2021-08-15 NOTE — Progress Notes (Signed)
Cardiology Office Note   Date:  08/17/2021   ID:  Melissa Carpenter, DOB 1934/06/26, MRN [id number]  PCP:  [name], [name], MD  Cardiologist:   Minus Breeding, MD   Chief Complaint  Patient presents with   Palpitations      History of Present Illness: Melissa Carpenter is a 88 y.o. female who presents for follow up of palpitations and HTN.  Since I last saw her she has continued to have some palpitations.  They are not particularly bothersome to her however.  She notices them occasionally.  She had some the other day and she noticed her blood pressure was 180 at that time been back down to systolic 742.  She said her blood pressure is really never going down below 595 systolic or rarely.  She is not having any shortness of breath, orthopnea.  She denies any presyncope or syncope.  She does relate household chores.  Takes care of her husband who has chronic medical illnesses.    Past Medical History:  Diagnosis Date   Allergy    Aortic insufficiency    Mild, echo, December, 2009   Arthritis    Carotid artery disease ([place])    Cataract    Cellulitis    Chest pain    Catheterization, 2003, no significant CAD   Diastolic dysfunction    Mild diastolic dysfunction, echo, December, 2012   DJD (degenerative joint disease)    Dyslipidemia    Edema    November, 2012   GERD (gastroesophageal reflux disease)    Hyperlipidemia    Hypertension    Mitral valve prolapse    Echo, 2009, very mild intermittent prolapse of the posterior leaflet, no MR   Osteoporosis    Prediabetes    Tachycardia    Nighttime tachycardia, February, 2014   Thyroid nodule     Past Surgical History:  Procedure Laterality Date   [place]   BIOPSY THYROID Left    CARDIAC CATHETERIZATION  sept 2003   no significant cad   CATARACT EXTRACTION, BILATERAL     COLONOSCOPY     NASAL SINUS SURGERY     TONSILLECTOMY       Current Outpatient Medications  Medication Sig  Dispense Refill   Accu-Chek Softclix Lancets lancets Use to check blood sugars daily 100 each 3   amLODipine-atorvastatin (CADUET) 10-40 MG tablet Take 1 tablet by mouth daily. 90 tablet 3   aspirin 81 MG tablet Take 81 mg by mouth daily.     Calcium Carbonate-Vitamin D (CALTRATE 600+D) 600-400 MG-UNIT per tablet Take 1 tablet by mouth daily.     cholecalciferol (VITAMIN D) 400 UNITS TABS Take 1,000 Units by mouth.     estradiol (ESTRACE) 0.1 MG/GM vaginal cream Place 1 Applicatorful vaginally at bedtime. 42.5 g 12   fish oil-omega-3 fatty acids 1000 MG capsule Take 1 g by mouth daily.     fluticasone (FLONASE) 50 MCG/ACT nasal spray SPRAY 2 SPRAYS INTO EACH NOSTRIL EVERY DAY 48 mL 1   glucose blood test strip Check blood glucose QD and PRN DxE11.9 100 each 12   hydrALAZINE (APRESOLINE) 25 MG tablet Take 1 tablet (25 mg total) by mouth in the morning and at bedtime. 180 tablet 3   metoprolol succinate (TOPROL-XL) 100 MG 24 hr tablet Take 1 tablet (100 mg total) by mouth daily. TAKE WITH OR IMMEDIATELY FOLLOWING A MEAL. 90 tablet 3   Multiple Vitamin (MULTIVITAMIN) tablet Take  1 tablet by mouth daily.     telmisartan (MICARDIS) 80 MG tablet TAKE 1 TABLET BY MOUTH EVERY EVENING 90 tablet 3   triamcinolone cream (KENALOG) 0.1 % Apply 1 application topically 2 (two) times daily. 80 g 3   No current facility-administered medications for this visit.    Allergies:   Codeine, Forteo [parathyroid hormone (recomb)], Raloxifene, Morphine, Penicillins, Sulfites, and Sulfonamide derivatives    ROS:  Please see the history of present illness.   Otherwise, review of systems are positive for none.  All other systems are reviewed and negative.    PHYSICAL EXAM: VS:  BP (!) 144/64    Pulse 60    Ht 5' (1.524 m)    Wt 136 lb (61.7 kg)    SpO2 97%    BMI 26.56 kg/m  , BMI Body mass index is 26.56 kg/m.  GENERAL:  Well appearing NECK:  No jugular venous distention, waveform within normal limits, carotid  upstroke brisk and symmetric, no bruits, no thyromegaly LUNGS:  Clear to auscultation bilaterally CHEST:  Unremarkable HEART:  PMI not displaced or sustained,S1 and S2 within normal limits, no S3, no S4, no clicks, no rubs, 3 out of 6 apical systolic murmur radiating slightly at aortic outflow tract, no diastolic murmurs.   ABD:  Flat, positive bowel sounds normal in frequency in pitch, no bruits, no rebound, no guarding, no midline pulsatile mass, no hepatomegaly, no splenomegaly EXT:  2 plus pulses throughout, no edema, no cyanosis no clubbing   EKG:  EKG is ordered today Sinus rhythm, rate 60, axis within normal limits, intervals within normal limits, no acute ST-T wave changes.   Recent Labs: 05/16/2021: ALT 33; BUN 12; Creatinine, Ser 0.58; Hemoglobin 13.4; Platelets 195; Potassium 4.1; Sodium 141; TSH 1.610    Lipid Panel    Component Value Date/Time   CHOL 134 05/16/2021 0819   CHOL 165 01/13/2013 0833   TRIG 167 (H) 05/16/2021 0819   TRIG 198 (H) 03/01/2017 1706   TRIG 240 (H) 01/13/2013 0833   HDL 39 (L) 05/16/2021 0819   HDL 38 (L) 03/01/2017 1706   HDL 39 (L) 01/13/2013 0833   CHOLHDL 3.4 05/16/2021 0819   LDLCALC 67 05/16/2021 0819   LDLCALC 83 03/18/2014 0819   LDLCALC 78 01/13/2013 0833      Wt Readings from Last 3 Encounters:  08/17/21 136 lb (61.7 kg)  06/01/21 138 lb (62.6 kg)  05/18/21 139 lb (63 kg)      Other studies Reviewed: Additional studies/ records that were reviewed today include:  None Review of the above records demonstrates:  No   ASSESSMENT AND PLAN:  HTN:   Her blood pressures is elevated.  I am going to increase her hydralazine to 25 mg twice daily.   PALPITATIONS:    These are not particularly problematic so no change in therapy.  CAROTID STENOSIS:   This was mild in April 2021.  No further imaging at this time.   AS:   This was moderate on echo in May 2022.  She had some mild MR as well.  I am going to follow-up with  echocardiogram.    Current medicines are reviewed at length with the patient today.  The patient does not have concerns regarding medicines.  The following changes have been made:   As above  Labs/ tests ordered today include:     Orders Placed This Encounter  Procedures   ECHOCARDIOGRAM COMPLETE     Disposition:  FU with me in  in 12 months. Ronnell Guadalajara, MD  08/17/2021 11:02 AM    [place]

## 2021-08-17 ENCOUNTER — Encounter: Payer: Self-pay | Admitting: Cardiology

## 2021-08-17 ENCOUNTER — Other Ambulatory Visit: Payer: Self-pay

## 2021-08-17 ENCOUNTER — Ambulatory Visit (INDEPENDENT_AMBULATORY_CARE_PROVIDER_SITE_OTHER): Payer: Medicare Other | Admitting: Cardiology

## 2021-08-17 VITALS — BP 144/64 | HR 60 | Ht 60.0 in | Wt 136.0 lb

## 2021-08-17 DIAGNOSIS — I10 Essential (primary) hypertension: Secondary | ICD-10-CM | POA: Diagnosis not present

## 2021-08-17 DIAGNOSIS — R002 Palpitations: Secondary | ICD-10-CM

## 2021-08-17 DIAGNOSIS — I350 Nonrheumatic aortic (valve) stenosis: Secondary | ICD-10-CM | POA: Diagnosis not present

## 2021-08-17 DIAGNOSIS — I6529 Occlusion and stenosis of unspecified carotid artery: Secondary | ICD-10-CM | POA: Diagnosis not present

## 2021-08-17 MED ORDER — HYDRALAZINE HCL 25 MG PO TABS: 25.0000 mg | ORAL_TABLET | Freq: Two times a day (BID) | ORAL | 3 refills | Status: DC

## 2021-08-17 NOTE — Patient Instructions (Addendum)
Medication Instructions:  Please increase Hydralazine 25 mg to twice a day. Continue all other medications as listed.  *If you need a refill on your cardiac medications before your next appointment, please call your pharmacy*  Testing/Procedures: Your physician has requested that you have an echocardiogram. Echocardiography is a painless test that uses sound waves to create images of your heart. It provides your doctor with information about the size and shape of your heart and how well your hearts chambers and valves are working. This procedure takes approximately one hour. There are no restrictions for this procedure.  You will be contacted to be scheduled for this testing at [facility].  Follow-Up: At [facility], you and your health needs are our priority.  As part of our continuing mission to provide you with exceptional heart care, we have created designated Provider Care Teams.  These Care Teams include your primary Cardiologist (physician) and Advanced Practice Providers (APPs -  Physician Assistants and Nurse Practitioners) who all work together to provide you with the care you need, when you need it.  We recommend signing up for the patient portal called "MyChart".  Sign up information is provided on this After Visit Summary.  MyChart is used to connect with patients for Virtual Visits (Telemedicine).  Patients are able to view lab/test results, encounter notes, upcoming appointments, etc.  Non-urgent messages can be sent to your provider as well.   To learn more about what you can do with MyChart, go to [email or web address].    Your next appointment:   1 year(s)  The format for your next appointment:   In Person  Provider:   Minus Breeding, MD   Thank you for choosing [facility]!!

## 2021-08-17 NOTE — Addendum Note (Signed)
Addended by: Shellia Cleverly on: 08/17/2021 11:22 AM   Modules accepted: Orders

## 2021-08-29 ENCOUNTER — Other Ambulatory Visit: Payer: Self-pay | Admitting: Family Medicine

## 2021-09-13 DIAGNOSIS — H0102B Squamous blepharitis left eye, upper and lower eyelids: Secondary | ICD-10-CM | POA: Diagnosis not present

## 2021-09-13 DIAGNOSIS — D3131 Benign neoplasm of right choroid: Secondary | ICD-10-CM | POA: Diagnosis not present

## 2021-09-13 DIAGNOSIS — H1045 Other chronic allergic conjunctivitis: Secondary | ICD-10-CM | POA: Diagnosis not present

## 2021-09-13 DIAGNOSIS — H04123 Dry eye syndrome of bilateral lacrimal glands: Secondary | ICD-10-CM | POA: Diagnosis not present

## 2021-09-13 DIAGNOSIS — H0102A Squamous blepharitis right eye, upper and lower eyelids: Secondary | ICD-10-CM | POA: Diagnosis not present

## 2021-09-13 DIAGNOSIS — Z961 Presence of intraocular lens: Secondary | ICD-10-CM | POA: Diagnosis not present

## 2021-09-13 DIAGNOSIS — R7309 Other abnormal glucose: Secondary | ICD-10-CM | POA: Diagnosis not present

## 2021-09-13 DIAGNOSIS — H43813 Vitreous degeneration, bilateral: Secondary | ICD-10-CM | POA: Diagnosis not present

## 2021-09-13 LAB — HM DIABETES EYE EXAM

## 2021-09-14 ENCOUNTER — Other Ambulatory Visit: Payer: Medicare Other

## 2021-09-14 DIAGNOSIS — R7303 Prediabetes: Secondary | ICD-10-CM | POA: Diagnosis not present

## 2021-09-14 DIAGNOSIS — E785 Hyperlipidemia, unspecified: Secondary | ICD-10-CM | POA: Diagnosis not present

## 2021-09-14 DIAGNOSIS — I10 Essential (primary) hypertension: Secondary | ICD-10-CM

## 2021-09-14 DIAGNOSIS — Z0000 Encounter for general adult medical examination without abnormal findings: Secondary | ICD-10-CM | POA: Diagnosis not present

## 2021-09-14 LAB — BAYER DCA HB A1C WAIVED: HB A1C (BAYER DCA - WAIVED): 6 % — ABNORMAL HIGH (ref 4.8–5.6)

## 2021-09-15 LAB — CMP14+EGFR
ALT: 31 IU/L (ref 0–32)
AST: 30 IU/L (ref 0–40)
Albumin/Globulin Ratio: 1.8 (ref 1.2–2.2)
Albumin: 4.6 g/dL (ref 3.6–4.6)
Alkaline Phosphatase: 131 IU/L — ABNORMAL HIGH (ref 44–121)
BUN/Creatinine Ratio: 23 (ref 12–28)
BUN: 14 mg/dL (ref 8–27)
Bilirubin Total: 0.3 mg/dL (ref 0.0–1.2)
CO2: 23 mmol/L (ref 20–29)
Calcium: 9.4 mg/dL (ref 8.7–10.3)
Chloride: 106 mmol/L (ref 96–106)
Creatinine, Ser: 0.61 mg/dL (ref 0.57–1.00)
Globulin, Total: 2.6 g/dL (ref 1.5–4.5)
Glucose: 103 mg/dL — ABNORMAL HIGH (ref 70–99)
Potassium: 4.1 mmol/L (ref 3.5–5.2)
Sodium: 143 mmol/L (ref 134–144)
Total Protein: 7.2 g/dL (ref 6.0–8.5)
eGFR: 88 mL/min/{1.73_m2} (ref >59)

## 2021-09-15 LAB — LIPID PANEL
Chol/HDL Ratio: 3 ratio (ref 0.0–4.4)
Cholesterol, Total: 137 mg/dL (ref 100–199)
HDL: 45 mg/dL (ref >39)
LDL Chol Calc (NIH): 70 mg/dL (ref 0–99)
Triglycerides: 120 mg/dL (ref 0–149)
VLDL Cholesterol Cal: 22 mg/dL (ref 5–40)

## 2021-09-15 LAB — CBC WITH DIFFERENTIAL/PLATELET
Basophils Absolute: 0 10*3/uL (ref 0.0–0.2)
Basos: 1 %
EOS (ABSOLUTE): 0.2 10*3/uL (ref 0.0–0.4)
Eos: 3 %
Hematocrit: 37.5 % (ref 34.0–46.6)
Hemoglobin: 12.9 g/dL (ref 11.1–15.9)
Immature Grans (Abs): 0 10*3/uL (ref 0.0–0.1)
Immature Granulocytes: 0 %
Lymphocytes Absolute: 1.4 10*3/uL (ref 0.7–3.1)
Lymphs: 27 %
MCH: 30.7 pg (ref 26.6–33.0)
MCHC: 34.4 g/dL (ref 31.5–35.7)
MCV: 89 fL (ref 79–97)
Monocytes Absolute: 0.6 10*3/uL (ref 0.1–0.9)
Monocytes: 11 %
Neutrophils Absolute: 3 10*3/uL (ref 1.4–7.0)
Neutrophils: 58 %
Platelets: 212 10*3/uL (ref 150–450)
RBC: 4.2 x10E6/uL (ref 3.77–5.28)
RDW: 12.9 % (ref 11.7–15.4)
WBC: 5.2 10*3/uL (ref 3.4–10.8)

## 2021-09-15 LAB — TSH: TSH: 1.69 u[IU]/mL (ref 0.450–4.500)

## 2021-09-16 ENCOUNTER — Ambulatory Visit (HOSPITAL_COMMUNITY)
Admission: RE | Admit: 2021-09-16 | Discharge: 2021-09-16 | Disposition: A | Discharge status: Home / Self Care | Payer: Medicare Other | Source: Ambulatory Visit | Attending: Cardiology | Admitting: Cardiology

## 2021-09-16 ENCOUNTER — Other Ambulatory Visit: Payer: Self-pay

## 2021-09-16 DIAGNOSIS — I350 Nonrheumatic aortic (valve) stenosis: Secondary | ICD-10-CM | POA: Diagnosis not present

## 2021-09-16 LAB — ECHOCARDIOGRAM COMPLETE
AR max vel: 1.45 cm2
AV Area VTI: 1.49 cm2
AV Area mean vel: 1.43 cm2
AV Mean grad: 31.5 mmHg
AV Peak grad: 52.7 mmHg
Ao pk vel: 3.63 m/s
Area-P 1/2: 2.83 cm2
S' Lateral: 1.7 cm

## 2021-09-16 NOTE — Progress Notes (Signed)
*  PRELIMINARY RESULTS* Echocardiogram 2D Echocardiogram has been performed.  Melissa Carpenter 09/16/2021, 10:22 AM

## 2021-09-19 ENCOUNTER — Telehealth: Payer: Self-pay | Admitting: Cardiology

## 2021-09-19 ENCOUNTER — Other Ambulatory Visit: Payer: Self-pay

## 2021-09-19 ENCOUNTER — Ambulatory Visit (INDEPENDENT_AMBULATORY_CARE_PROVIDER_SITE_OTHER): Payer: Medicare Other | Admitting: Family Medicine

## 2021-09-19 ENCOUNTER — Encounter: Payer: Self-pay | Admitting: Family Medicine

## 2021-09-19 VITALS — BP 158/63 | HR 67 | Ht 60.0 in | Wt 137.0 lb

## 2021-09-19 DIAGNOSIS — I10 Essential (primary) hypertension: Secondary | ICD-10-CM

## 2021-09-19 DIAGNOSIS — R7303 Prediabetes: Secondary | ICD-10-CM

## 2021-09-19 DIAGNOSIS — E785 Hyperlipidemia, unspecified: Secondary | ICD-10-CM

## 2021-09-19 DIAGNOSIS — I6529 Occlusion and stenosis of unspecified carotid artery: Secondary | ICD-10-CM

## 2021-09-19 DIAGNOSIS — M810 Age-related osteoporosis without current pathological fracture: Secondary | ICD-10-CM | POA: Diagnosis not present

## 2021-09-19 DIAGNOSIS — I350 Nonrheumatic aortic (valve) stenosis: Secondary | ICD-10-CM

## 2021-09-19 NOTE — Telephone Encounter (Signed)
Called patient, advised of results.  ECHO ordered for one year.  Patient verbalized understanding. Routing to nurse as Juluis Rainier.

## 2021-09-19 NOTE — Telephone Encounter (Signed)
Pt returning call for results... please advise  

## 2021-09-19 NOTE — Progress Notes (Signed)
BP (!) 158/63    Pulse 67    Ht 5' (1.524 m)    Wt 137 lb (62.1 kg)    SpO2 98%    BMI 26.76 kg/m    Subjective:   Patient ID: Melissa Carpenter, female    DOB: 1933/08/28, 88 y.o.   MRN: [id number]  HPI: Melissa Carpenter is a 88 y.o. female presenting on 09/19/2021 for Medical Management of Chronic Issues, Prediabetes, and Hypertension   HPI Hypertension Patient is currently on amlodipine and hydralazine and metoprolol and telmisartan, cardiology just recently increased the hydralazine within the past couple weeks, and their blood pressure today is 158/63. Patient denies any lightheadedness or dizziness. Patient denies headaches, blurred vision, chest pains, shortness of breath, or weakness. Denies any side effects from medication and is content with current medication.   Hyperlipidemia Patient is coming in for recheck of his hyperlipidemia. The patient is currently taking atorvastatin and fish oil. They deny any issues with myalgias or history of liver damage from it. They deny any focal numbness or weakness or chest pain.   Prediabetes Patient comes in today for recheck of his diabetes. Patient has been currently taking no medication currently, A1c 6.0. Patient is currently on an ACE inhibitor/ARB. Patient has seen an ophthalmologist this year. Patient denies any issues with their feet. The symptom started onset as an adult hyperlipidemia and hypertension and aortic stenosis and CAD ARE RELATED TO DM   She sees cardiology and just had an echocardiogram on Friday but has not been read yet.  Osteoporosis/osteopenia Fractures or history of fracture: None Medication: Calcium and vitamin D Duration of treatment: 2 years Last bone density scan: 2 years ago Last T score: -2.1, osteopenia  Relevant past medical, surgical, family and social history reviewed and updated as indicated. Interim medical history since our last visit reviewed. Allergies and medications reviewed and updated.  Review of Systems   Constitutional:  Negative for chills and fever.  Eyes:  Negative for redness and visual disturbance.  Respiratory:  Negative for chest tightness and shortness of breath.   Cardiovascular:  Negative for chest pain and leg swelling.  Musculoskeletal:  Negative for back pain and gait problem.  Skin:  Negative for rash.  Neurological:  Negative for dizziness, light-headedness and headaches.  Psychiatric/Behavioral:  Negative for agitation and behavioral problems.   All other systems reviewed and are negative.  Per HPI unless specifically indicated above   Allergies as of 09/19/2021       Reactions   Codeine Hives   Forteo [parathyroid Hormone (recomb)] Other (See Comments)   Weakness and calcium increase   Raloxifene Other (See Comments)   Eye problems Other reaction(s): Other (See Comments) Eye problems Eye problems   Morphine Rash   Penicillins Rash   Sulfites Rash   Sulfonamide Derivatives Rash        Medication List        Accurate as of September 19, 2021 11:00 AM. If you have any questions, ask your nurse or doctor.          Accu-Chek Softclix Lancets lancets Use to check blood sugars daily   amLODipine-atorvastatin 10-40 MG tablet Commonly known as: CADUET Take 1 tablet by mouth daily.   aspirin 81 MG tablet Take 81 mg by mouth daily.   Calcium Carbonate-Vitamin D 600-400 MG-UNIT tablet Commonly known as: Caltrate 600+D Take 1 tablet by mouth daily.   cholecalciferol 10 MCG (400 UNIT) Tabs tablet Commonly known as: VITAMIN D3 Take  1,000 Units by mouth.   estradiol 0.1 MG/GM vaginal cream Commonly known as: ESTRACE Place 1 Applicatorful vaginally at bedtime.   fish oil-omega-3 fatty acids 1000 MG capsule Take 1 g by mouth daily.   fluticasone 50 MCG/ACT nasal spray Commonly known as: FLONASE SPRAY 2 SPRAYS IN EACH NOSTRIL EVERY DAY   glucose blood test strip Check blood glucose QD and PRN DxE11.9   hydrALAZINE 25 MG tablet Commonly known as:  APRESOLINE Take 1 tablet (25 mg total) by mouth in the morning and at bedtime.   metoprolol succinate 100 MG 24 hr tablet Commonly known as: TOPROL-XL Take 1 tablet (100 mg total) by mouth daily. TAKE WITH OR IMMEDIATELY FOLLOWING A MEAL.   multivitamin tablet Take 1 tablet by mouth daily.   telmisartan 80 MG tablet Commonly known as: MICARDIS TAKE 1 TABLET BY MOUTH EVERY EVENING   triamcinolone cream 0.1 % Commonly known as: KENALOG Apply 1 application topically 2 (two) times daily.         Objective:   BP (!) 158/63    Pulse 67    Ht 5' (1.524 m)    Wt 137 lb (62.1 kg)    SpO2 98%    BMI 26.76 kg/m   Wt Readings from Last 3 Encounters:  09/19/21 137 lb (62.1 kg)  08/17/21 136 lb (61.7 kg)  06/01/21 138 lb (62.6 kg)    Physical Exam Vitals and nursing note reviewed.  Constitutional:      General: She is not in acute distress.    Appearance: She is well-developed. She is not diaphoretic.  Eyes:     Conjunctiva/sclera: Conjunctivae normal.  Cardiovascular:     Rate and Rhythm: Normal rate and regular rhythm.     Heart sounds: Murmur (4 out of 6 holosystolic murmur best heard at second intercostal) heard.  Pulmonary:     Effort: Pulmonary effort is normal. No respiratory distress.     Breath sounds: Normal breath sounds. No wheezing.  Musculoskeletal:        General: No tenderness. Normal range of motion.  Skin:    General: Skin is warm and dry.     Findings: No rash.  Neurological:     Mental Status: She is alert and oriented to person, place, and time.     Coordination: Coordination normal.  Psychiatric:        Behavior: Behavior normal.      Assessment & Plan:   Problem List Items Addressed This Visit       Cardiovascular and Mediastinum   Essential hypertension (Chronic)     Musculoskeletal and Integument   Osteoporosis   Relevant Orders   DG [facility] DEXA     Other   Dyslipidemia - Primary (Chronic)   Pre-diabetes    Blood work looks good,  A1c still borderline and blood sugar still borderline. Blood pressure is elevated today and has been up but they just adjusted her medicines 2 weeks ago so I will not change anything right now, I want her to continue to monitor it for a month and bring [place] the numbers back  Follow up plan: Return in about 4 months (around 01/17/2022), or if symptoms worsen or fail to improve, for Hypertension and prediabetes.  Counseling provided for all of the vaccine components Orders Placed This Encounter  Procedures   DG [facility] DEXA    Arville Care, MD [facility] 09/19/2021, 11:00 AM

## 2021-10-04 ENCOUNTER — Other Ambulatory Visit: Payer: Self-pay | Admitting: Family Medicine

## 2021-10-04 DIAGNOSIS — N6039 Fibrosclerosis of unspecified breast: Secondary | ICD-10-CM

## 2021-10-04 DIAGNOSIS — N6452 Nipple discharge: Secondary | ICD-10-CM

## 2021-10-04 DIAGNOSIS — R922 Inconclusive mammogram: Secondary | ICD-10-CM

## 2021-10-10 ENCOUNTER — Other Ambulatory Visit: Payer: Medicare Other

## 2021-10-12 ENCOUNTER — Other Ambulatory Visit: Payer: Self-pay

## 2021-10-12 DIAGNOSIS — M810 Age-related osteoporosis without current pathological fracture: Secondary | ICD-10-CM

## 2021-10-17 ENCOUNTER — Other Ambulatory Visit: Payer: Medicare Other

## 2021-10-20 ENCOUNTER — Other Ambulatory Visit: Payer: Self-pay | Admitting: Cardiology

## 2021-11-01 ENCOUNTER — Ambulatory Visit
Admission: RE | Admit: 2021-11-01 | Discharge: 2021-11-01 | Disposition: A | Discharge status: Home / Self Care | Payer: Medicare Other | Source: Ambulatory Visit | Attending: Family Medicine | Admitting: Family Medicine

## 2021-11-01 DIAGNOSIS — R928 Other abnormal and inconclusive findings on diagnostic imaging of breast: Secondary | ICD-10-CM | POA: Diagnosis not present

## 2021-11-01 DIAGNOSIS — N6039 Fibrosclerosis of unspecified breast: Secondary | ICD-10-CM

## 2021-11-01 DIAGNOSIS — R922 Inconclusive mammogram: Secondary | ICD-10-CM

## 2021-11-01 IMAGING — MR MR BREAST BILAT WO/W CM
8 of 12 series · 33 of 48 positions shown · IV contrast (6ml Gadavist)
Comparison: [DATE] MRI

CLINICAL DATA: The patient presented for MRI with nipple discharge
[DATE]. Non mass enhancement was identified in the
retroareolar region extending into the lower inner quadrant of the
left breast. The anterior and posterior aspects of the enhancement
were biopsied on [DATE] demonstrating dense fibrosis with
dilated ducts. Pathology was found to be concordant. Per protocol,
this is a six-month follow-up.

EXAM:
BILATERAL BREAST MRI WITH AND WITHOUT CONTRAST
TECHNIQUE: Multiplanar, multisequence MR images of both breasts were obtained
prior to and following the intravenous administration of 6 ml of
Gadavist

[Series 2: t2_tirm_tra ipat (a-p) · axial · 3.0mm · 0.70mm/px · 1 of 55 slices shown]
[im 1/55]
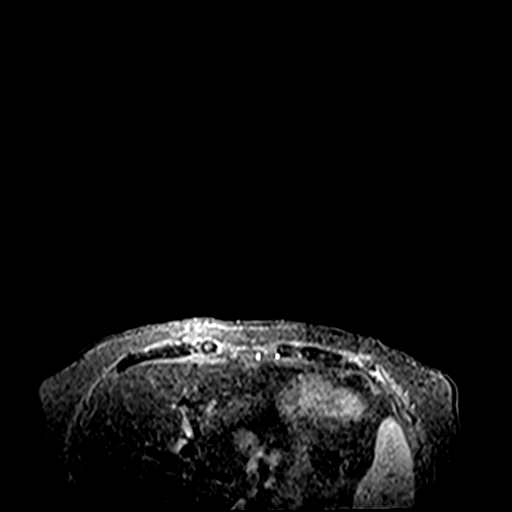

[Series 3: fl3d pre-cm no · axial · non-contrast · 1.2mm · 0.94mm/px · z∈[-86,+86]mm · 5 of 144 slices shown]
[im 1/144]
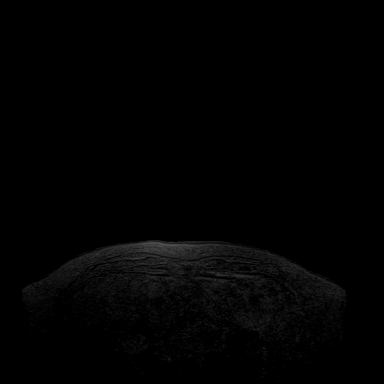
[im 36/144]
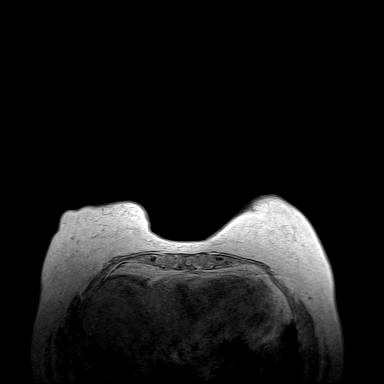
[im 72/144]
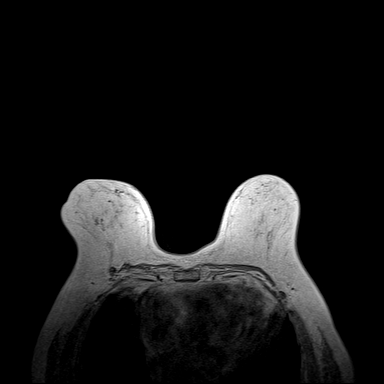
[im 108/144]
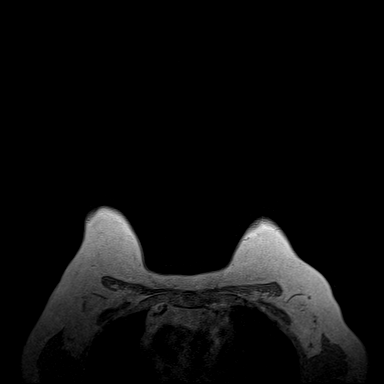
[im 144/144]
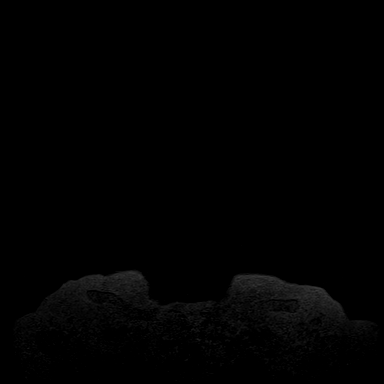

[Series 5: fl3d pre-cm · axial · non-contrast · 1.2mm · 0.94mm/px · z∈[-86,+86]mm · 5 of 144 slices shown]
[im 1/144]
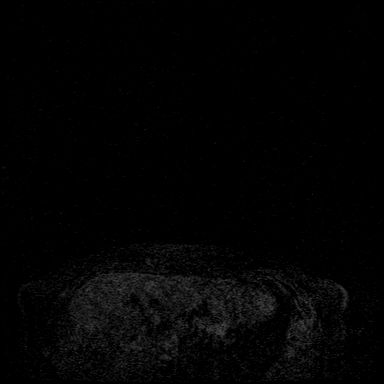
[im 36/144]
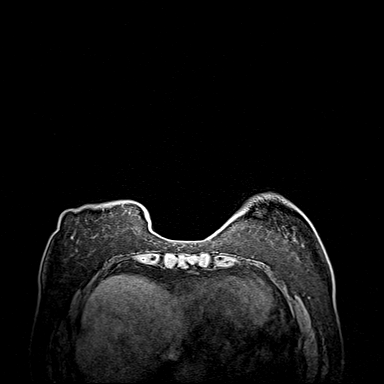
[im 72/144]
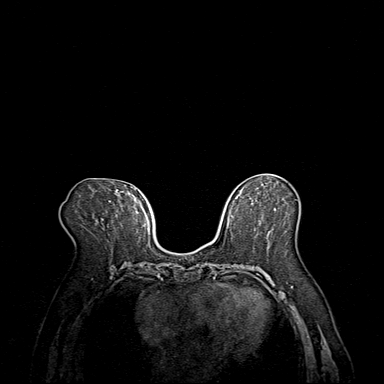
[im 108/144]
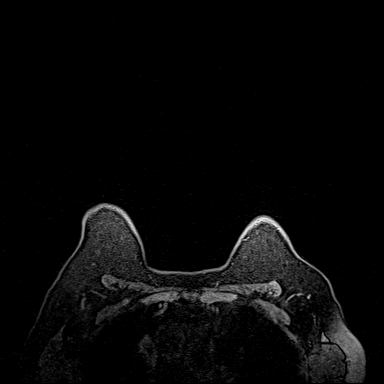
[im 144/144]
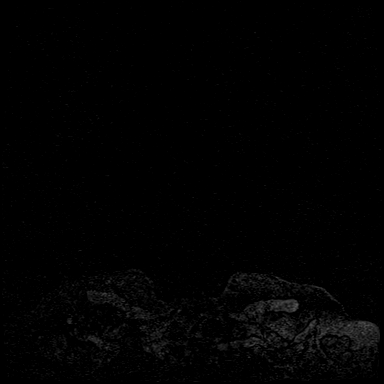

[Series 6: fl3d post immediate · axial · 1.2mm · 0.94mm/px · z∈[-86,+86]mm · 5 of 144 slices shown (1 of 3)]
[im 1/144]
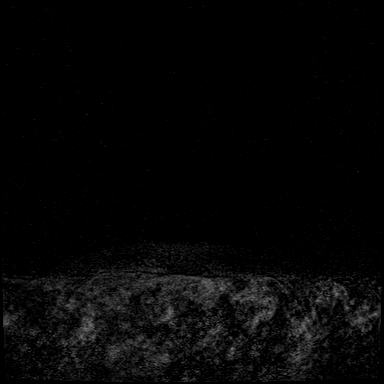
[im 36/144]
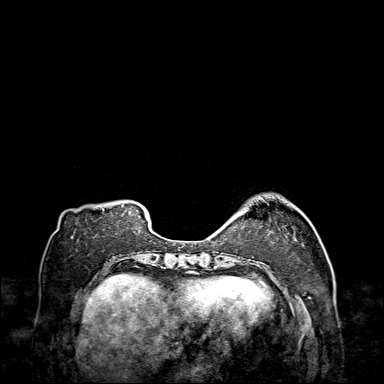
[im 72/144]
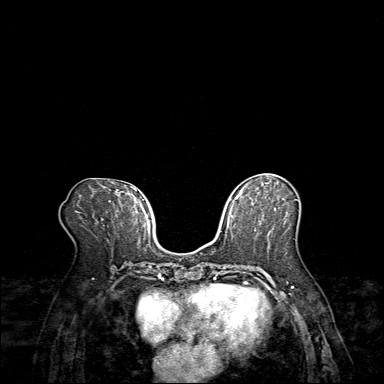
[im 108/144]
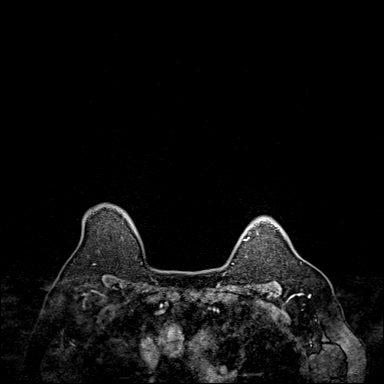
[im 144/144]
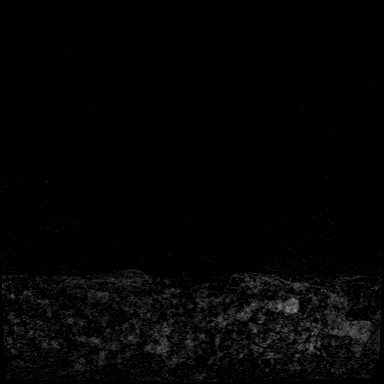

[Series 7: fl3d post immediate · axial · 1.2mm · 0.94mm/px · z∈[-86,+86]mm · 5 of 144 slices shown (2 of 3)]
[im 1/144]
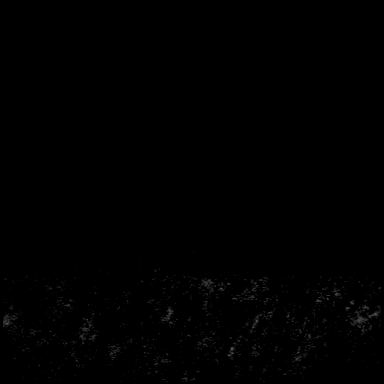
[im 36/144]
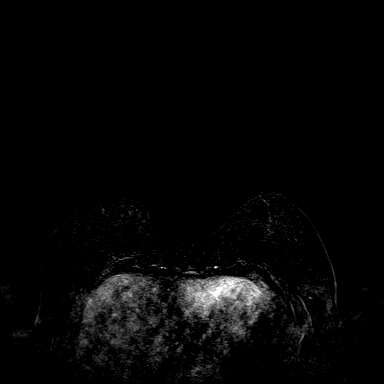
[im 72/144]
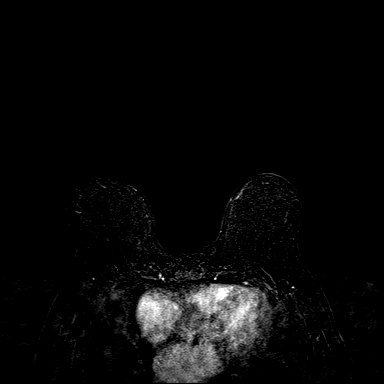
[im 108/144]
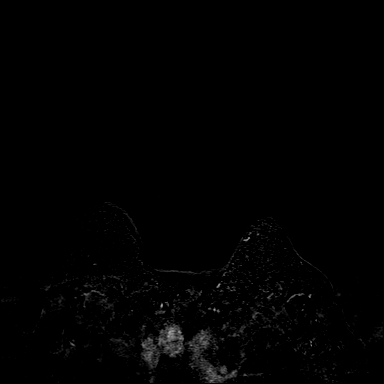
[im 144/144]
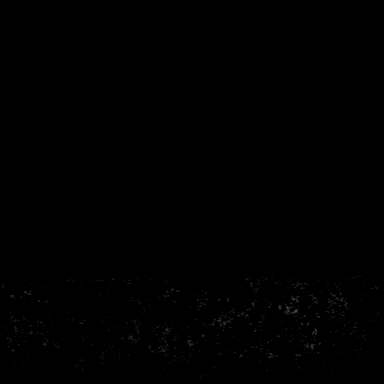

[Series 8: fl3d post immediate · axial · 172.8mm · 0.94mm/px · 1 of 1 slices shown (3 of 3)]
[im 1/1]
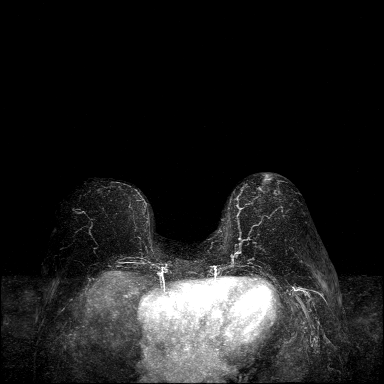

[Series 9: fl3d post 3min · axial · 1.2mm · 0.94mm/px · z∈[-86,+86]mm · 6 of 144 slices shown]
[im 1/144]
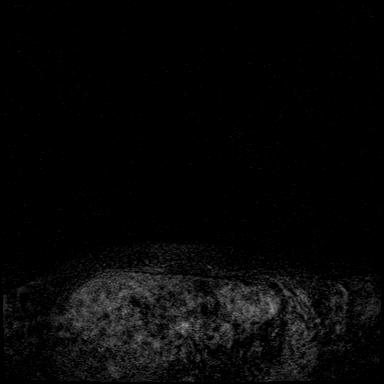
[im 29/144]
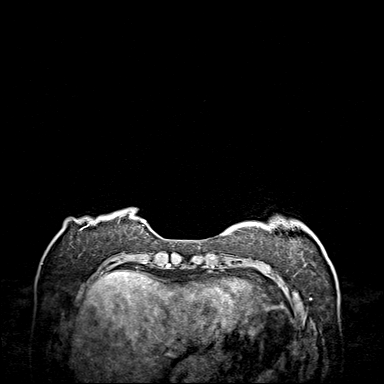
[im 58/144]
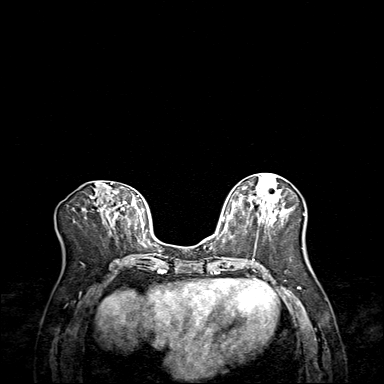
[im 86/144]
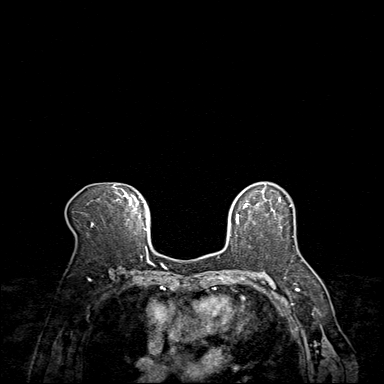
[im 115/144]
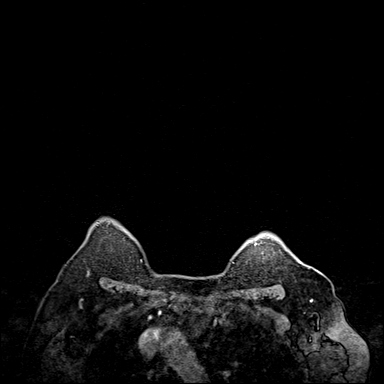
[im 144/144]
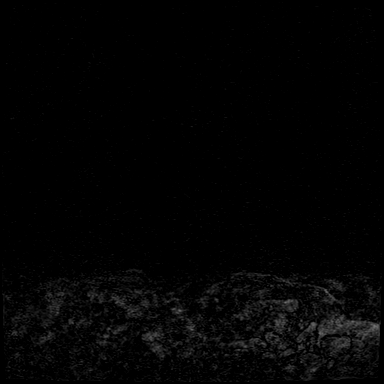

[Series 10: fl3d post 3min_sub · axial · 1.2mm · 0.94mm/px · z∈[-86,+51]mm · 5 of 144 slices shown]
[im 1/144]
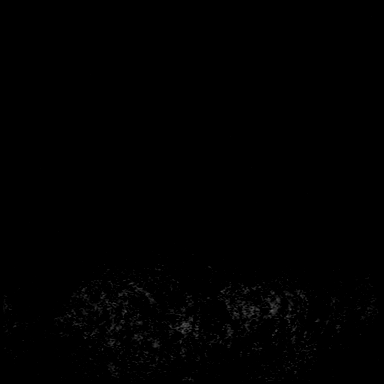
[im 29/144]
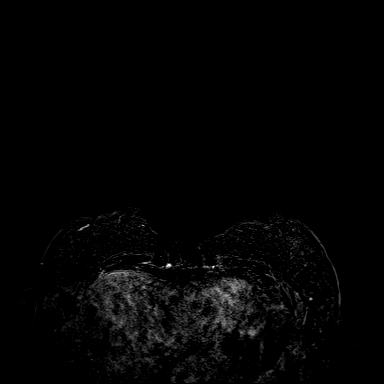
[im 58/144]
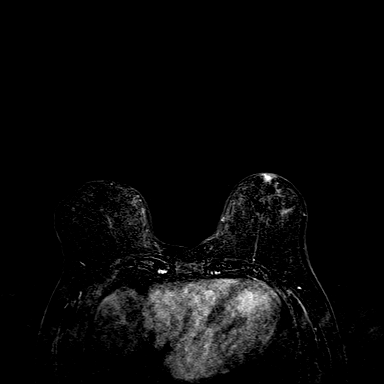
[im 86/144]
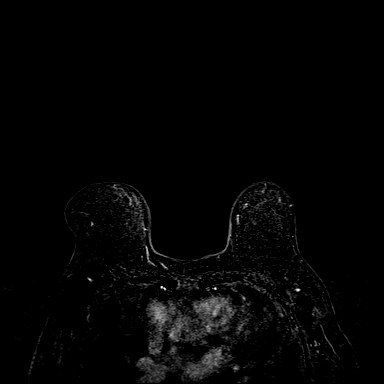
[im 115/144]
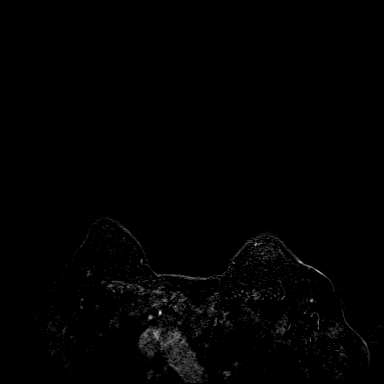

[33 of 48 positions shown; findings below may reference images not displayed]

Three-dimensional MR images were rendered by post-processing of the
original MR data on an independent workstation. The
three-dimensional MR images were interpreted, and findings are
reported in the following complete MRI report for this study. Three
dimensional images were evaluated at the independent interpreting
workstation using the DynaCAD thin client.
FINDINGS: Breast composition: b. Scattered fibroglandular tissue.

Background parenchymal enhancement: Mild

Right breast: No mass or abnormal enhancement.

Left breast: The non mass enhancement in the left retroareolar
region, extending into the lower inner quadrant has significantly
improved in the interval. Biopsy clips are within the site of
previously identified enhancement indicating the region was well
sampled. No MRI evidence of malignancy on the left.

Lymph nodes: No abnormal appearing lymph nodes.

Ancillary findings:  None.
IMPRESSION: 1. No MRI evidence of malignancy in either breast. The non mass
enhancement in the left breast, described above, has significantly
improved in the interval.

RECOMMENDATION:
Recommend clinical follow-up of the patient's symptoms. Recommend
continued annual screening mammography.

BI-RADS CATEGORY  2: Benign.

## 2021-11-01 MED ORDER — GADOBUTROL 1 MMOL/ML IV SOLN
6.0000 mL | Freq: Once | INTRAVENOUS | Status: AC | PRN
  Administered 2021-11-01: 6 mL via INTRAVENOUS

## 2021-11-07 ENCOUNTER — Encounter: Payer: Self-pay | Admitting: Family Medicine

## 2021-11-07 ENCOUNTER — Ambulatory Visit (INDEPENDENT_AMBULATORY_CARE_PROVIDER_SITE_OTHER): Payer: Medicare Other | Admitting: Family Medicine

## 2021-11-07 VITALS — BP 164/64 | HR 68 | Ht 60.0 in | Wt 137.0 lb

## 2021-11-07 DIAGNOSIS — I6529 Occlusion and stenosis of unspecified carotid artery: Secondary | ICD-10-CM | POA: Diagnosis not present

## 2021-11-07 DIAGNOSIS — N63 Unspecified lump in unspecified breast: Secondary | ICD-10-CM

## 2021-11-07 NOTE — Progress Notes (Signed)
? ?BP (!) 164/64   Pulse 68   Ht 5' (1.524 m)   Wt 137 lb (62.1 kg)   SpO2 94%   BMI 26.76 kg/m?   ? ?Subjective:  ? ?Patient ID: Melissa Carpenter, female    DOB: 08/18/1933, 88 y.o.   MRN: [id number] ? ?HPI: ?Melissa Carpenter is a 88 y.o. female presenting on 11/07/2021 for Discuss MRI results (Breast) ? ? ?HPI ?Breast MRI f/u ?Patient is coming in for breast MRI f/u and breast nodule.  She had MRI that showed improvement and no sign of malignancy.  The breast imaging showed improved lesion where she had had a previous biopsy that showed no abnormalities and just recommended that she follow-up with her yearly mammograms.  She still gets a little soreness on the breast and recommended topical Aspercreme, may be inflammation from the biopsy. ? ?Relevant past medical, surgical, family and social history reviewed and updated as indicated. Interim medical history since our last visit reviewed. ?Allergies and medications reviewed and updated. ? ?Review of Systems  ?Constitutional:  Negative for chills and fever.  ?Eyes:  Negative for visual disturbance.  ?Respiratory:  Negative for chest tightness and shortness of breath.   ?Cardiovascular:  Negative for chest pain and leg swelling.  ?Musculoskeletal:  Negative for back pain and gait problem.  ?Skin:  Negative for rash.  ?Psychiatric/Behavioral:  Negative for agitation and behavioral problems.   ?All other systems reviewed and are negative. ? ?Per HPI unless specifically indicated above ? ? ?Allergies as of 11/07/2021   ? ?   Reactions  ? Codeine Hives  ? Forteo [parathyroid Hormone (recomb)] Other (See Comments)  ? Weakness and calcium increase  ? Raloxifene Other (See Comments)  ? Eye problems ?Other reaction(s): Other (See Comments) ?Eye problems ?Eye problems  ? Morphine Rash  ? Penicillins Rash  ? Sulfites Rash  ? Sulfonamide Derivatives Rash  ? ?  ? ?  ?Medication List  ?  ? ?  ? Accurate as of November 07, 2021 12:45 PM. If you have any questions, ask your nurse or doctor.  ?   ?  ? ?  ? ?Accu-Chek Softclix Lancets lancets ?Use to check blood sugars daily ?  ?amLODipine-atorvastatin 10-40 MG tablet ?Commonly known as: CADUET ?Take 1 tablet by mouth daily. ?  ?aspirin 81 MG tablet ?Take 81 mg by mouth daily. ?  ?Calcium Carbonate-Vitamin D 600-400 MG-UNIT tablet ?Commonly known as: Caltrate 600+D ?Take 1 tablet by mouth daily. ?  ?cholecalciferol 10 MCG (400 UNIT) Tabs tablet ?Commonly known as: VITAMIN D3 ?Take 1,000 Units by mouth. ?  ?estradiol 0.1 MG/GM vaginal cream ?Commonly known as: ESTRACE ?Place 1 Applicatorful vaginally at bedtime. ?  ?fish oil-omega-3 fatty acids 1000 MG capsule ?Take 1 g by mouth daily. ?  ?fluticasone 50 MCG/ACT nasal spray ?Commonly known as: FLONASE ?SPRAY 2 SPRAYS IN EACH NOSTRIL EVERY DAY ?  ?glucose blood test strip ?Check blood glucose QD and PRN DxE11.9 ?  ?hydrALAZINE 25 MG tablet ?Commonly known as: APRESOLINE ?Take 1 tablet (25 mg total) by mouth in the morning and at bedtime. ?  ?metoprolol succinate 100 MG 24 hr tablet ?Commonly known as: TOPROL-XL ?Take 1 tablet (100 mg total) by mouth daily. TAKE WITH OR IMMEDIATELY FOLLOWING A MEAL. ?  ?multivitamin tablet ?Take 1 tablet by mouth daily. ?  ?telmisartan 80 MG tablet ?Commonly known as: MICARDIS ?TAKE 1 TABLET BY MOUTH EVERY DAY IN THE EVENING ?  ?triamcinolone cream 0.1 % ?Commonly known as:  KENALOG ?Apply 1 application topically 2 (two) times daily. ?  ? ?  ? ? ? ?Objective:  ? ?BP (!) 164/64   Pulse 68   Ht 5' (1.524 m)   Wt 137 lb (62.1 kg)   SpO2 94%   BMI 26.76 kg/m?   ?Wt Readings from Last 3 Encounters:  ?11/07/21 137 lb (62.1 kg)  ?09/19/21 137 lb (62.1 kg)  ?08/17/21 136 lb (61.7 kg)  ?  ?Physical Exam ?Vitals and nursing note reviewed.  ?Constitutional:   ?   General: She is not in acute distress. ?   Appearance: She is well-developed. She is not diaphoretic.  ?Skin: ?   General: Skin is warm and dry.  ?   Findings: No rash.  ?Neurological:  ?   Mental Status: She is alert and  oriented to person, place, and time.  ?   Coordination: Coordination normal.  ?Psychiatric:     ?   Behavior: Behavior normal.  ? ? ? ? ?Assessment & Plan:  ? ?Problem List Items Addressed This Visit   ?None ?Visit Diagnoses   ? ? Breast nodule    -  Primary  ? ?  ?  ?Repeat MRI breast showed improvement, showed lesion where it was biopsied but showed that it had shrunk.  Recommended follow-up with mammogram on her yearly mammograms. ?Follow up plan: ?No follow-ups on file. ? ?Counseling provided for all of the vaccine components ?No orders of the defined types were placed in this encounter. ? ? ?Caryl Pina, MD ?[place] ?11/07/2021, 12:45 PM ? ? ? ? ?

## 2021-11-17 ENCOUNTER — Ambulatory Visit (INDEPENDENT_AMBULATORY_CARE_PROVIDER_SITE_OTHER): Payer: Medicare Other | Admitting: Family Medicine

## 2021-11-17 ENCOUNTER — Encounter: Payer: Self-pay | Admitting: Family Medicine

## 2021-11-17 VITALS — BP 149/58 | HR 73 | Temp 98.1°F | Ht 61.0 in | Wt 136.4 lb

## 2021-11-17 DIAGNOSIS — R051 Acute cough: Secondary | ICD-10-CM | POA: Diagnosis not present

## 2021-11-17 DIAGNOSIS — J0110 Acute frontal sinusitis, unspecified: Secondary | ICD-10-CM

## 2021-11-17 MED ORDER — AZITHROMYCIN 250 MG PO TABS: ORAL_TABLET | ORAL | 0 refills | Status: DC

## 2021-11-17 MED ORDER — ALBUTEROL SULFATE HFA 108 (90 BASE) MCG/ACT IN AERS: 2.0000 | INHALATION_SPRAY | Freq: Four times a day (QID) | RESPIRATORY_TRACT | 2 refills | Status: DC | PRN

## 2021-11-17 NOTE — Progress Notes (Signed)
? ?Assessment & Plan:  ?1. Acute non-recurrent frontal sinusitis ?Education provided on sinusitis. Discussed symptom management.  ?- azithromycin (ZITHROMAX) 250 MG tablet; Take 2 tablets on day 1, then 1 tablet daily on days 2 through 5  Dispense: 6 tablet; Refill: 0 ?- albuterol (VENTOLIN HFA) 108 (90 Base) MCG/ACT inhaler; Inhale 2 puffs into the lungs every 6 (six) hours as needed.  Dispense: 18 g; Refill: 2 ? ?2. Acute cough ?- Novel Coronavirus, NAA ([facility]); Future ? ? ?Follow up plan: Return if symptoms worsen or fail to improve. ? ?Hendricks Limes, MSN, APRN, FNP-C ?[place] ? ?Subjective:  ? ?Patient ID: Melissa Carpenter, female    DOB: 15-Dec-1933, 88 y.o.   MRN: [id number] ? ?HPI: ?Melissa Carpenter is a 88 y.o. female presenting on 11/17/2021 for Cough, Nasal Congestion, and Facial Pain (Right sided facial pressure. X 3 days ) ? ?Patient complains of cough, head/chest congestion, runny nose, sneezing, sore throat, ear pain/pressure, facial pain/pressure, postnasal drainage, shortness of breath, and wheezing. Onset of symptoms was 3 days ago, unchanged since that time. She is drinking plenty of fluids. Evaluation to date: none. Treatment to date:  Mucinex and Tylenol .  She does not smoke.  ? ? ?ROS: Negative unless specifically indicated above in HPI.  ? ?Relevant past medical history reviewed and updated as indicated.  ? ?Allergies and medications reviewed and updated. ? ? ?Current Outpatient Medications:  ?  Accu-Chek Softclix Lancets lancets, Use to check blood sugars daily, Disp: 100 each, Rfl: 3 ?  amLODipine-atorvastatin (CADUET) 10-40 MG tablet, Take 1 tablet by mouth daily., Disp: 90 tablet, Rfl: 3 ?  aspirin 81 MG tablet, Take 81 mg by mouth daily., Disp: , Rfl:  ?  Calcium Carbonate-Vitamin D (CALTRATE 600+D) 600-400 MG-UNIT per tablet, Take 1 tablet by mouth daily., Disp: , Rfl:  ?  cholecalciferol (VITAMIN D) 400 UNITS TABS, Take 1,000 Units by mouth., Disp: , Rfl:  ?  estradiol  (ESTRACE) 0.1 MG/GM vaginal cream, Place 1 Applicatorful vaginally at bedtime., Disp: 42.5 g, Rfl: 12 ?  fish oil-omega-3 fatty acids 1000 MG capsule, Take 1 g by mouth daily., Disp: , Rfl:  ?  fluticasone (FLONASE) 50 MCG/ACT nasal spray, SPRAY 2 SPRAYS IN EACH NOSTRIL EVERY DAY, Disp: 48 mL, Rfl: 2 ?  glucose blood test strip, Check blood glucose QD and PRN DxE11.9, Disp: 100 each, Rfl: 12 ?  hydrALAZINE (APRESOLINE) 25 MG tablet, Take 1 tablet (25 mg total) by mouth in the morning and at bedtime., Disp: 180 tablet, Rfl: 3 ?  metoprolol succinate (TOPROL-XL) 100 MG 24 hr tablet, Take 1 tablet (100 mg total) by mouth daily. TAKE WITH OR IMMEDIATELY FOLLOWING A MEAL., Disp: 90 tablet, Rfl: 3 ?  Multiple Vitamin (MULTIVITAMIN) tablet, Take 1 tablet by mouth daily., Disp: , Rfl:  ?  telmisartan (MICARDIS) 80 MG tablet, TAKE 1 TABLET BY MOUTH EVERY DAY IN THE EVENING, Disp: 90 tablet, Rfl: 3 ?  triamcinolone cream (KENALOG) 0.1 %, Apply 1 application topically 2 (two) times daily., Disp: 80 g, Rfl: 3 ? ?Allergies  ?Allergen Reactions  ? Codeine Hives  ? Forteo [Parathyroid Hormone (Recomb)] Other (See Comments)  ?  Weakness and calcium increase  ? Raloxifene Other (See Comments)  ?  Eye problems ?Other reaction(s): Other (See Comments) ?Eye problems ?Eye problems  ? Morphine Rash  ? Penicillins Rash  ? Sulfites Rash  ? Sulfonamide Derivatives Rash  ? ? ?Objective:  ? ?BP (!) 149/58  Pulse 73   Temp 98.1 ?F (36.7 ?C) (Temporal)   Ht '5\' 1"'$  (1.549 m)   Wt 136 lb 6.4 oz (61.9 kg)   SpO2 93%   BMI 25.77 kg/m?   ? ?Physical Exam ?Vitals reviewed.  ?Constitutional:   ?   General: She is not in acute distress. ?   Appearance: Normal appearance. She is not ill-appearing, toxic-appearing or diaphoretic.  ?HENT:  ?   Head: Normocephalic and atraumatic.  ?   Right Ear: Tympanic membrane, ear canal and external ear normal. There is no impacted cerumen.  ?   Left Ear: Tympanic membrane, ear canal and external ear normal.  There is no impacted cerumen.  ?   Nose: No congestion or rhinorrhea.  ?   Right Sinus: Frontal sinus tenderness present.  ?   Left Sinus: Frontal sinus tenderness present.  ?   Mouth/Throat:  ?   Mouth: Mucous membranes are moist.  ?   Pharynx: Oropharynx is clear. Posterior oropharyngeal erythema present. No oropharyngeal exudate.  ?Eyes:  ?   General: No scleral icterus.    ?   Right eye: No discharge.     ?   Left eye: No discharge.  ?   Conjunctiva/sclera: Conjunctivae normal.  ?Cardiovascular:  ?   Rate and Rhythm: Normal rate and regular rhythm.  ?   Heart sounds: Normal heart sounds. No murmur heard. ?  No friction rub. No gallop.  ?Pulmonary:  ?   Effort: Pulmonary effort is normal. No respiratory distress.  ?   Breath sounds: Normal breath sounds. No stridor. No wheezing, rhonchi or rales.  ?Musculoskeletal:     ?   General: Normal range of motion.  ?   Cervical back: Normal range of motion.  ?Lymphadenopathy:  ?   Cervical: No cervical adenopathy.  ?Skin: ?   General: Skin is warm and dry.  ?   Capillary Refill: Capillary refill takes less than 2 seconds.  ?Neurological:  ?   General: No focal deficit present.  ?   Mental Status: She is alert and oriented to person, place, and time. Mental status is at baseline.  ?Psychiatric:     ?   Mood and Affect: Mood normal.     ?   Behavior: Behavior normal.     ?   Thought Content: Thought content normal.     ?   Judgment: Judgment normal.  ? ? ? ? ? ? ?

## 2021-11-18 LAB — NOVEL CORONAVIRUS, NAA: SARS-CoV-2, NAA: NOT DETECTED

## 2021-11-19 ENCOUNTER — Emergency Department (HOSPITAL_COMMUNITY): Payer: Medicare Other

## 2021-11-19 ENCOUNTER — Other Ambulatory Visit: Payer: Self-pay

## 2021-11-19 ENCOUNTER — Encounter (HOSPITAL_COMMUNITY): Payer: Self-pay

## 2021-11-19 ENCOUNTER — Inpatient Hospital Stay (HOSPITAL_COMMUNITY)
Admission: EM | Admit: 2021-11-19 | Discharge: 2021-11-23 | DRG: 193 | Disposition: A | Discharge status: Home / Self Care | Payer: Medicare Other | Attending: Internal Medicine | Admitting: Internal Medicine

## 2021-11-19 DIAGNOSIS — Z79899 Other long term (current) drug therapy: Secondary | ICD-10-CM

## 2021-11-19 DIAGNOSIS — I5033 Acute on chronic diastolic (congestive) heart failure: Secondary | ICD-10-CM | POA: Diagnosis present

## 2021-11-19 DIAGNOSIS — I110 Hypertensive heart disease with heart failure: Secondary | ICD-10-CM | POA: Diagnosis present

## 2021-11-19 DIAGNOSIS — Z83438 Family history of other disorder of lipoprotein metabolism and other lipidemia: Secondary | ICD-10-CM

## 2021-11-19 DIAGNOSIS — R7303 Prediabetes: Secondary | ICD-10-CM | POA: Diagnosis not present

## 2021-11-19 DIAGNOSIS — Z20822 Contact with and (suspected) exposure to covid-19: Secondary | ICD-10-CM | POA: Diagnosis present

## 2021-11-19 DIAGNOSIS — Z7982 Long term (current) use of aspirin: Secondary | ICD-10-CM

## 2021-11-19 DIAGNOSIS — E876 Hypokalemia: Secondary | ICD-10-CM | POA: Diagnosis present

## 2021-11-19 DIAGNOSIS — Z8249 Family history of ischemic heart disease and other diseases of the circulatory system: Secondary | ICD-10-CM

## 2021-11-19 DIAGNOSIS — R0902 Hypoxemia: Secondary | ICD-10-CM | POA: Diagnosis not present

## 2021-11-19 DIAGNOSIS — Z66 Do not resuscitate: Secondary | ICD-10-CM | POA: Diagnosis present

## 2021-11-19 DIAGNOSIS — E785 Hyperlipidemia, unspecified: Secondary | ICD-10-CM | POA: Diagnosis present

## 2021-11-19 DIAGNOSIS — K449 Diaphragmatic hernia without obstruction or gangrene: Secondary | ICD-10-CM | POA: Diagnosis not present

## 2021-11-19 DIAGNOSIS — Z90710 Acquired absence of both cervix and uterus: Secondary | ICD-10-CM | POA: Diagnosis not present

## 2021-11-19 DIAGNOSIS — K219 Gastro-esophageal reflux disease without esophagitis: Secondary | ICD-10-CM | POA: Diagnosis present

## 2021-11-19 DIAGNOSIS — I10 Essential (primary) hypertension: Secondary | ICD-10-CM | POA: Diagnosis not present

## 2021-11-19 DIAGNOSIS — Z882 Allergy status to sulfonamides status: Secondary | ICD-10-CM

## 2021-11-19 DIAGNOSIS — R059 Cough, unspecified: Secondary | ICD-10-CM | POA: Diagnosis not present

## 2021-11-19 DIAGNOSIS — J9811 Atelectasis: Secondary | ICD-10-CM | POA: Diagnosis not present

## 2021-11-19 DIAGNOSIS — M810 Age-related osteoporosis without current pathological fracture: Secondary | ICD-10-CM | POA: Diagnosis present

## 2021-11-19 DIAGNOSIS — I350 Nonrheumatic aortic (valve) stenosis: Secondary | ICD-10-CM

## 2021-11-19 DIAGNOSIS — J189 Pneumonia, unspecified organism: Secondary | ICD-10-CM | POA: Diagnosis present

## 2021-11-19 DIAGNOSIS — D649 Anemia, unspecified: Secondary | ICD-10-CM | POA: Diagnosis not present

## 2021-11-19 DIAGNOSIS — R911 Solitary pulmonary nodule: Secondary | ICD-10-CM | POA: Diagnosis present

## 2021-11-19 DIAGNOSIS — I2510 Atherosclerotic heart disease of native coronary artery without angina pectoris: Secondary | ICD-10-CM | POA: Diagnosis present

## 2021-11-19 DIAGNOSIS — J9601 Acute respiratory failure with hypoxia: Secondary | ICD-10-CM | POA: Diagnosis present

## 2021-11-19 DIAGNOSIS — Z885 Allergy status to narcotic agent status: Secondary | ICD-10-CM

## 2021-11-19 DIAGNOSIS — R0602 Shortness of breath: Secondary | ICD-10-CM | POA: Diagnosis not present

## 2021-11-19 DIAGNOSIS — I779 Disorder of arteries and arterioles, unspecified: Secondary | ICD-10-CM | POA: Diagnosis present

## 2021-11-19 DIAGNOSIS — Z880 Allergy status to penicillin: Secondary | ICD-10-CM

## 2021-11-19 DIAGNOSIS — Z888 Allergy status to other drugs, medicaments and biological substances status: Secondary | ICD-10-CM

## 2021-11-19 DIAGNOSIS — Z79890 Hormone replacement therapy (postmenopausal): Secondary | ICD-10-CM | POA: Diagnosis not present

## 2021-11-19 DIAGNOSIS — J811 Chronic pulmonary edema: Secondary | ICD-10-CM | POA: Diagnosis not present

## 2021-11-19 DIAGNOSIS — I080 Rheumatic disorders of both mitral and aortic valves: Secondary | ICD-10-CM | POA: Diagnosis present

## 2021-11-19 DIAGNOSIS — J440 Chronic obstructive pulmonary disease with acute lower respiratory infection: Secondary | ICD-10-CM | POA: Diagnosis present

## 2021-11-19 DIAGNOSIS — I517 Cardiomegaly: Secondary | ICD-10-CM | POA: Diagnosis not present

## 2021-11-19 DIAGNOSIS — J90 Pleural effusion, not elsewhere classified: Secondary | ICD-10-CM | POA: Diagnosis not present

## 2021-11-19 LAB — CBC WITH DIFFERENTIAL/PLATELET
Abs Immature Granulocytes: 0.04 10*3/uL (ref 0.00–0.07)
Basophils Absolute: 0 10*3/uL (ref 0.0–0.1)
Basophils Relative: 1 %
Eosinophils Absolute: 0.1 10*3/uL (ref 0.0–0.5)
Eosinophils Relative: 1 %
HCT: 34.7 % — ABNORMAL LOW (ref 36.0–46.0)
Hemoglobin: 11.5 g/dL — ABNORMAL LOW (ref 12.0–15.0)
Immature Granulocytes: 1 %
Lymphocytes Relative: 11 %
Lymphs Abs: 0.9 10*3/uL (ref 0.7–4.0)
MCH: 30.7 pg (ref 26.0–34.0)
MCHC: 33.1 g/dL (ref 30.0–36.0)
MCV: 92.8 fL (ref 80.0–100.0)
Monocytes Absolute: 1.1 10*3/uL — ABNORMAL HIGH (ref 0.1–1.0)
Monocytes Relative: 13 %
Neutro Abs: 6.2 10*3/uL (ref 1.7–7.7)
Neutrophils Relative %: 73 %
Platelets: 177 10*3/uL (ref 150–400)
RBC: 3.74 MIL/uL — ABNORMAL LOW (ref 3.87–5.11)
RDW: 13.3 % (ref 11.5–15.5)
WBC: 8.4 10*3/uL (ref 4.0–10.5)
nRBC: 0 % (ref 0.0–0.2)

## 2021-11-19 LAB — RESP PANEL BY RT-PCR (FLU A&B, COVID) ARPGX2
Influenza A by PCR: NEGATIVE
Influenza B by PCR: NEGATIVE
SARS Coronavirus 2 by RT PCR: NEGATIVE

## 2021-11-19 LAB — BASIC METABOLIC PANEL
Anion gap: 10 (ref 5–15)
BUN: 14 mg/dL (ref 8–23)
CO2: 23 mmol/L (ref 22–32)
Calcium: 8.8 mg/dL — ABNORMAL LOW (ref 8.9–10.3)
Chloride: 102 mmol/L (ref 98–111)
Creatinine, Ser: 0.48 mg/dL (ref 0.44–1.00)
GFR, Estimated: >60 mL/min (ref >60)
Glucose, Bld: 119 mg/dL — ABNORMAL HIGH (ref 70–99)
Potassium: 3.6 mmol/L (ref 3.5–5.1)
Sodium: 135 mmol/L (ref 135–145)

## 2021-11-19 LAB — PROCALCITONIN: Procalcitonin: <0.1 ng/mL

## 2021-11-19 LAB — LACTIC ACID, PLASMA
Lactic Acid, Venous: 1 mmol/L (ref 0.5–1.9)
Lactic Acid, Venous: 2.2 mmol/L (ref 0.5–1.9)

## 2021-11-19 LAB — BLOOD GAS, ARTERIAL
Acid-Base Excess: 0.3 mmol/L (ref 0.0–2.0)
Bicarbonate: 23.5 mmol/L (ref 20.0–28.0)
Drawn by: 23430
FIO2: 48 %
O2 Saturation: 96.9 %
Patient temperature: 37.7
pCO2 arterial: 34 mmHg (ref 32–48)
pH, Arterial: 7.45 (ref 7.35–7.45)
pO2, Arterial: 74 mmHg — ABNORMAL LOW (ref 83–108)

## 2021-11-19 LAB — TROPONIN I (HIGH SENSITIVITY)
Troponin I (High Sensitivity): 11 ng/L (ref <18)
Troponin I (High Sensitivity): 17 ng/L (ref <18)

## 2021-11-19 IMAGING — DX DG CHEST 1V PORT
1 series · 1 of 1 positions shown · non-contrast
Comparison: [DATE]

CLINICAL DATA: Shortness of breath.  Decreased O2 sats.

EXAM:
PORTABLE CHEST 1 VIEW

[chest ap]
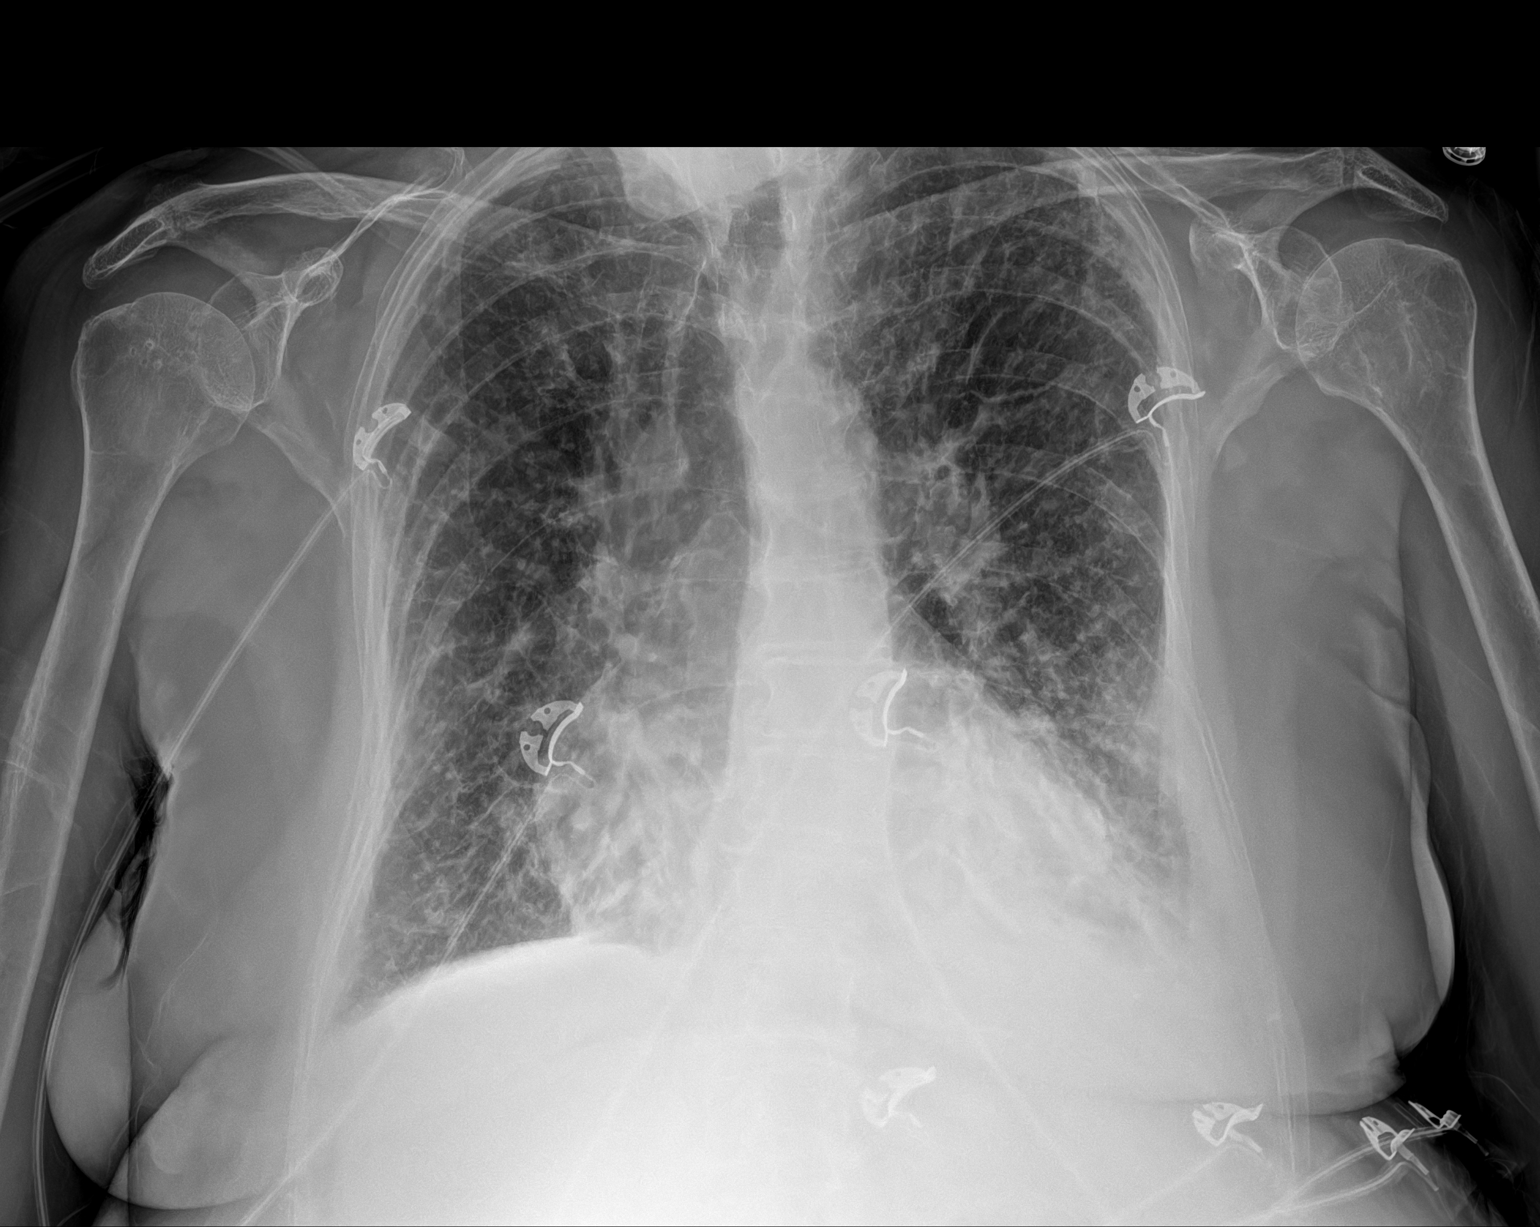

[1 of 1 positions shown; findings below may reference images not displayed]

FINDINGS: [01] hours. The cardio pericardial silhouette is enlarged.
Interstitial markings are diffusely coarsened with chronic features.
Lungs are hyperexpanded. Atelectasis/airspace disease noted left
base with small left pleural effusion. Bones are diffusely
demineralized. Telemetry leads overlie the chest.
IMPRESSION: 1. Left base atelectasis/airspace disease with small left pleural
effusion. Pneumonia could have this appearance.
2. Cardiomegaly and chronic interstitial changes.

## 2021-11-19 IMAGING — CT CT ANGIO CHEST
2 of 6 series · 15 of 36 positions shown · IV contrast (agent unspecified)
Comparison: None.
COMPARISON: None.

Addendum:
CLINICAL DATA: Shortness of breath since this morning. Decreased O2
sats.

EXAM:
CT ANGIOGRAPHY CHEST WITH CONTRAST
TECHNIQUE: Multidetector CT imaging of the chest was performed using the
standard protocol during bolus administration of intravenous
contrast. Multiplanar CT image reconstructions and MIPs were
obtained to evaluate the vascular anatomy.

[Series 5: pe axial thins · axial · 0.62mm/px · z∈[+1047,+1297]mm · 14 of 359 slices shown]
[im 23/359  lung]
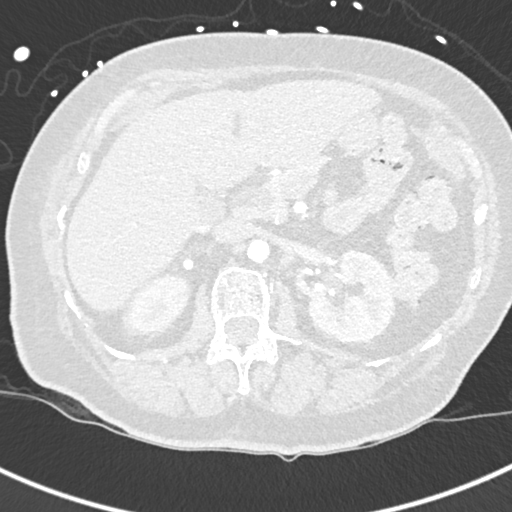
[im 45/359  mediastinal]
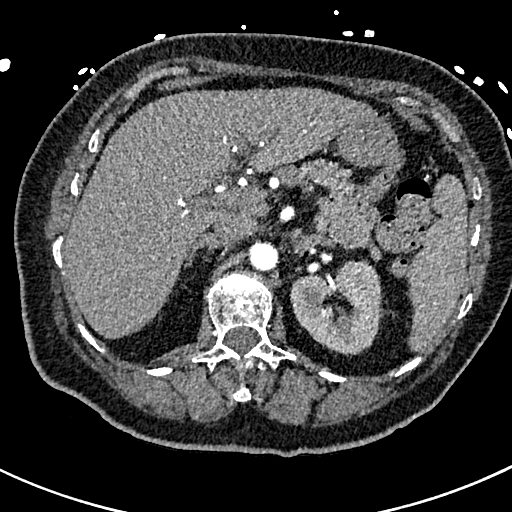
[im 68/359  lung]
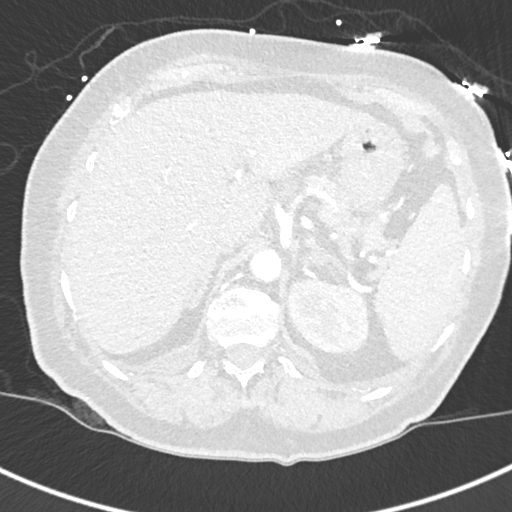
[im 90/359  mediastinal]
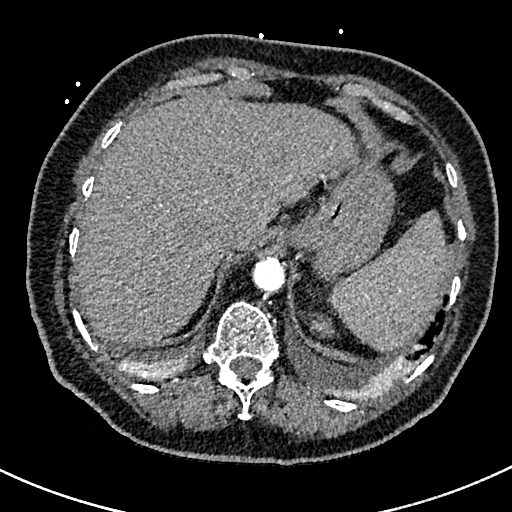
[im 112/359  lung]
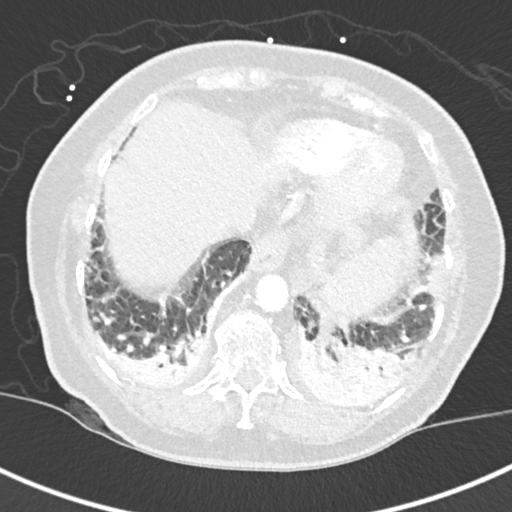
[im 135/359  mediastinal]
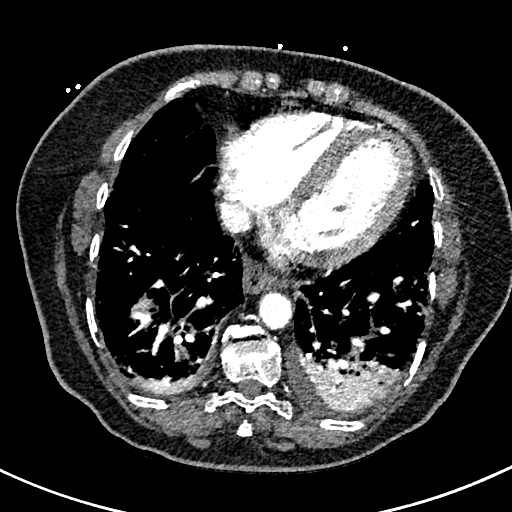
[im 157/359  lung]
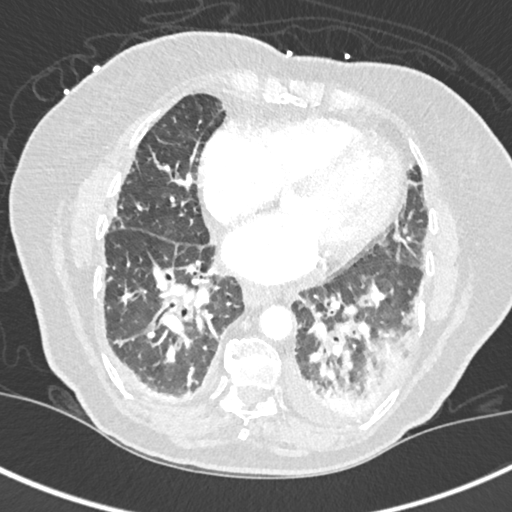
[im 202/359  mediastinal]
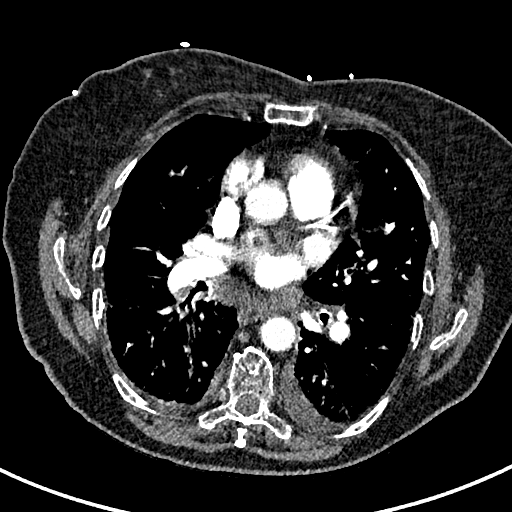
[im 224/359  lung]
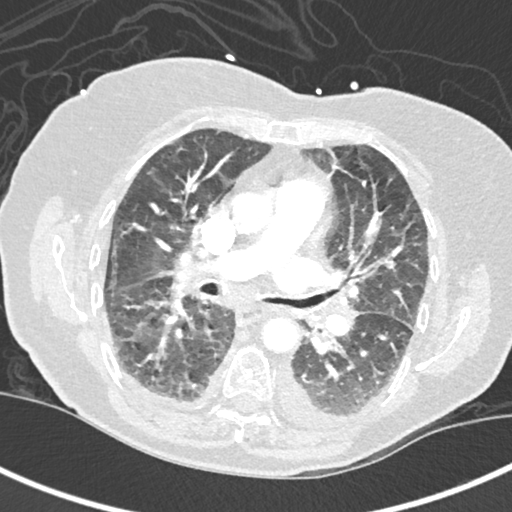
[im 247/359  mediastinal]
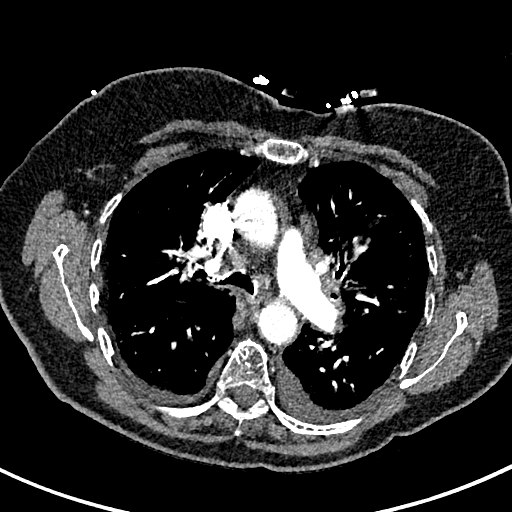
[im 269/359  lung]
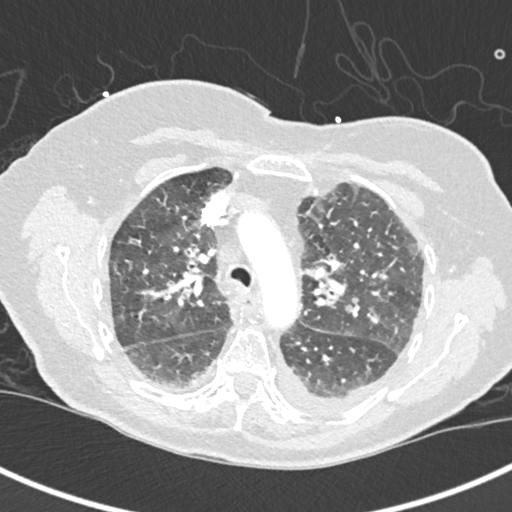
[im 291/359  mediastinal]
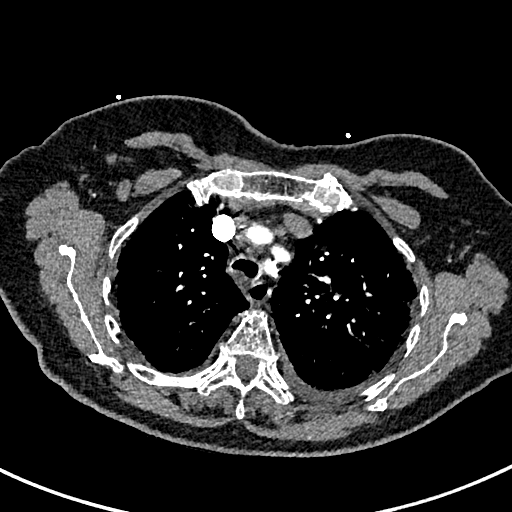
[im 314/359  lung]
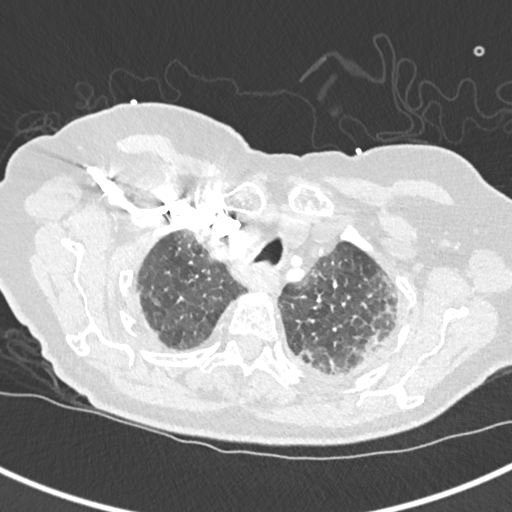
[im 336/359  mediastinal]
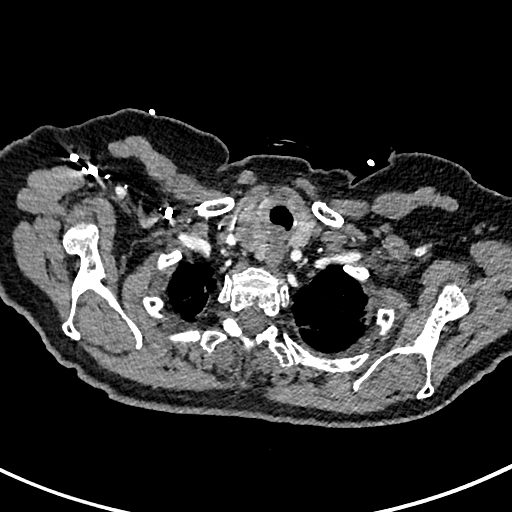

[Series 8: cor soft · coronal · 0.56mm/px · 1 of 146 slices shown]
[im 73/146  mediastinal]
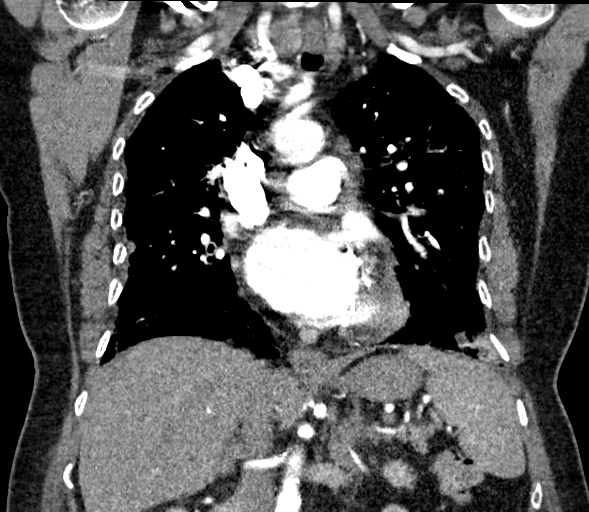

[15 of 36 positions shown; findings below may reference images not displayed]

RADIATION DOSE REDUCTION: This exam was performed according to the
departmental dose-optimization program which includes automated
exposure control, adjustment of the mA and/or kV according to
patient size and/or use of iterative reconstruction technique.

CONTRAST:  75mL OMNIPAQUE IOHEXOL 350 MG/ML SOLN
FINDINGS: Cardiovascular: Heart size mildly increased. Coronary artery
calcification is evident. Mild atherosclerotic calcification is
noted in the wall of the thoracic aorta. There is no filling defect
within the opacified pulmonary arteries to suggest the presence of
an acute pulmonary embolus.

Mediastinum/Nodes: 1.8 cm short axis subcarinal lymph node evident.
No other mediastinal lymphadenopathy. 1.7 cm right thyroid nodule
evident. There is no hilar lymphadenopathy. The esophagus has normal
imaging features. There is no axillary lymphadenopathy.

Lungs/Pleura: Interlobular septal thickening noted in both lungs
diffusely. Patchy/nodular airspace disease is identified in both
lower lobes, left greater than right nodular component in the right
lower lobe measures up to 17 mm on 91/6. 7 mm nodule identified
posterior right middle lobe on 78/6. Small bilateral pleural
effusions evident.

Upper Abdomen: Tiny layering calcified gallstones noted.

Musculoskeletal: No worrisome lytic or sclerotic osseous
abnormality.

Review of the MIP images confirms the above findings.
IMPRESSION: 1. No CT evidence for acute pulmonary embolus.
2. Patchy/nodular airspace disease in both lower lobes, left greater
than right. Imaging features likely be related to multifocal
pneumonia although close follow-up recommended to ensure resolution.
3. 7 mm right middle lobe pulmonary nodule.
7 mm right solid pulmonary nodule. Recommend a non-contrast Chest CT
at 6-12 months. If patient is high risk for malignancy, recommend an
additional non-contrast Chest CT at 18-24 months; if patient is low
risk for malignancy a non-contrast Chest CT at 18-24 months is
optional.
These guidelines do not apply to immunocompromised patients and
patients with cancer. Follow up in patients with significant
comorbidities as clinically warranted. For lung cancer screening,
adhere to Lung-RADS guidelines. Reference: Radiology. [Q4];
284(1):228-43.
4. Small bilateral pleural effusions.
5. 1.7 cm right thyroid nodule. Recommend thyroid US (ref: [HOSPITAL]. [DATE]): 143-50).
6. Cholelithiasis.
7. Aortic Atherosclerosis ([Q4]-[Q4]).

ADDENDUM:
As noted in the body of the report, there is a 1.8 cm short axis
subcarinal lymph node, mildly enlarged. While this may be reactive,
follow-up CT chest in 3 months recommended to ensure stability.

*** End of Addendum ***
RADIATION DOSE REDUCTION: This exam was performed according to the
departmental dose-optimization program which includes automated
exposure control, adjustment of the mA and/or kV according to
patient size and/or use of iterative reconstruction technique.

CONTRAST:  75mL OMNIPAQUE IOHEXOL 350 MG/ML SOLN
FINDINGS: Cardiovascular: Heart size mildly increased. Coronary artery
calcification is evident. Mild atherosclerotic calcification is
noted in the wall of the thoracic aorta. There is no filling defect
within the opacified pulmonary arteries to suggest the presence of
an acute pulmonary embolus.

Mediastinum/Nodes: 1.8 cm short axis subcarinal lymph node evident.
No other mediastinal lymphadenopathy. 1.7 cm right thyroid nodule
evident. There is no hilar lymphadenopathy. The esophagus has normal
imaging features. There is no axillary lymphadenopathy.

Lungs/Pleura: Interlobular septal thickening noted in both lungs
diffusely. Patchy/nodular airspace disease is identified in both
lower lobes, left greater than right nodular component in the right
lower lobe measures up to 17 mm on 91/6. 7 mm nodule identified
posterior right middle lobe on 78/6. Small bilateral pleural
effusions evident.

Upper Abdomen: Tiny layering calcified gallstones noted.

Musculoskeletal: No worrisome lytic or sclerotic osseous
abnormality.

Review of the MIP images confirms the above findings.
IMPRESSION: 1. No CT evidence for acute pulmonary embolus.
2. Patchy/nodular airspace disease in both lower lobes, left greater
than right. Imaging features likely be related to multifocal
pneumonia although close follow-up recommended to ensure resolution.
3. 7 mm right middle lobe pulmonary nodule.
7 mm right solid pulmonary nodule. Recommend a non-contrast Chest CT
at 6-12 months. If patient is high risk for malignancy, recommend an
additional non-contrast Chest CT at 18-24 months; if patient is low
risk for malignancy a non-contrast Chest CT at 18-24 months is
optional.
These guidelines do not apply to immunocompromised patients and
patients with cancer. Follow up in patients with significant
comorbidities as clinically warranted. For lung cancer screening,
adhere to Lung-RADS guidelines. Reference: Radiology. [Q4];
284(1):228-43.
4. Small bilateral pleural effusions.
5. 1.7 cm right thyroid nodule. Recommend thyroid US (ref: [HOSPITAL]. [DATE]): 143-50).
6. Cholelithiasis.
7. Aortic Atherosclerosis ([Q4]-[Q4]).

## 2021-11-19 MED ORDER — LORAZEPAM 2 MG/ML IJ SOLN
0.5000 mg | INTRAMUSCULAR | Status: DC | PRN
  Administered 2021-11-19 – 2021-11-22 (×5): 0.5 mg via INTRAVENOUS
  Filled 2021-11-19 (×5): qty 1

## 2021-11-19 MED ORDER — ACETAMINOPHEN 650 MG RE SUPP: 650.0000 mg | Freq: Four times a day (QID) | RECTAL | Status: DC | PRN

## 2021-11-19 MED ORDER — IPRATROPIUM-ALBUTEROL 0.5-2.5 (3) MG/3ML IN SOLN
3.0000 mL | Freq: Three times a day (TID) | RESPIRATORY_TRACT | Status: DC
  Administered 2021-11-19 – 2021-11-22 (×10): 3 mL via RESPIRATORY_TRACT
  Filled 2021-11-19 (×3): qty 3
  Filled 2021-11-19: qty 6
  Filled 2021-11-19 (×2): qty 3
  Filled 2021-11-19: qty 6

## 2021-11-19 MED ORDER — SODIUM CHLORIDE 0.9 % IV SOLN
1.0000 g | Freq: Once | INTRAVENOUS | Status: AC
  Administered 2021-11-19: 1 g via INTRAVENOUS
  Filled 2021-11-19: qty 10

## 2021-11-19 MED ORDER — METHYLPREDNISOLONE SODIUM SUCC 125 MG IJ SOLR
60.0000 mg | Freq: Two times a day (BID) | INTRAMUSCULAR | Status: DC
  Administered 2021-11-20 – 2021-11-21 (×3): 60 mg via INTRAVENOUS
  Filled 2021-11-19 (×3): qty 2

## 2021-11-19 MED ORDER — FLUTICASONE PROPIONATE 50 MCG/ACT NA SUSP
2.0000 | Freq: Every day | NASAL | Status: DC
  Administered 2021-11-20 – 2021-11-23 (×4): 2 via NASAL
  Filled 2021-11-19 (×2): qty 16

## 2021-11-19 MED ORDER — IPRATROPIUM-ALBUTEROL 0.5-2.5 (3) MG/3ML IN SOLN
RESPIRATORY_TRACT | Status: AC
  Filled 2021-11-19: qty 3

## 2021-11-19 MED ORDER — CHLORHEXIDINE GLUCONATE CLOTH 2 % EX PADS
6.0000 | MEDICATED_PAD | Freq: Every day | CUTANEOUS | Status: DC
  Administered 2021-11-20: 6 via TOPICAL

## 2021-11-19 MED ORDER — ONDANSETRON HCL 4 MG/2ML IJ SOLN: 4.0000 mg | Freq: Four times a day (QID) | INTRAMUSCULAR | Status: DC | PRN

## 2021-11-19 MED ORDER — GUAIFENESIN ER 600 MG PO TB12
600.0000 mg | ORAL_TABLET | Freq: Two times a day (BID) | ORAL | Status: DC
  Administered 2021-11-19 – 2021-11-23 (×8): 600 mg via ORAL
  Filled 2021-11-19 (×8): qty 1

## 2021-11-19 MED ORDER — HYDRALAZINE HCL 25 MG PO TABS
25.0000 mg | ORAL_TABLET | Freq: Two times a day (BID) | ORAL | Status: DC
  Administered 2021-11-19 – 2021-11-23 (×8): 25 mg via ORAL
  Filled 2021-11-19 (×8): qty 1

## 2021-11-19 MED ORDER — AMLODIPINE BESYLATE 5 MG PO TABS
10.0000 mg | ORAL_TABLET | Freq: Every day | ORAL | Status: DC
  Administered 2021-11-19 – 2021-11-23 (×5): 10 mg via ORAL
  Filled 2021-11-19 (×5): qty 2

## 2021-11-19 MED ORDER — METOPROLOL SUCCINATE ER 50 MG PO TB24
100.0000 mg | ORAL_TABLET | Freq: Every day | ORAL | Status: DC
  Administered 2021-11-20 – 2021-11-23 (×3): 100 mg via ORAL
  Filled 2021-11-19 (×4): qty 2

## 2021-11-19 MED ORDER — IRBESARTAN 150 MG PO TABS
300.0000 mg | ORAL_TABLET | Freq: Every day | ORAL | Status: DC
  Administered 2021-11-19 – 2021-11-23 (×4): 300 mg via ORAL
  Filled 2021-11-19 (×5): qty 2

## 2021-11-19 MED ORDER — IPRATROPIUM-ALBUTEROL 0.5-2.5 (3) MG/3ML IN SOLN
3.0000 mL | Freq: Once | RESPIRATORY_TRACT | Status: AC
  Administered 2021-11-19: 3 mL via RESPIRATORY_TRACT

## 2021-11-19 MED ORDER — ASPIRIN 81 MG PO CHEW
81.0000 mg | CHEWABLE_TABLET | Freq: Every day | ORAL | Status: DC
  Administered 2021-11-20 – 2021-11-23 (×4): 81 mg via ORAL
  Filled 2021-11-19 (×4): qty 1

## 2021-11-19 MED ORDER — IOHEXOL 350 MG/ML SOLN
75.0000 mL | Freq: Once | INTRAVENOUS | Status: AC | PRN
  Administered 2021-11-19: 75 mL via INTRAVENOUS

## 2021-11-19 MED ORDER — ONDANSETRON HCL 4 MG PO TABS: 4.0000 mg | ORAL_TABLET | Freq: Four times a day (QID) | ORAL | Status: DC | PRN

## 2021-11-19 MED ORDER — AMLODIPINE-ATORVASTATIN 10-40 MG PO TABS: 1.0000 | ORAL_TABLET | Freq: Every day | ORAL | Status: DC

## 2021-11-19 MED ORDER — METHYLPREDNISOLONE SODIUM SUCC 125 MG IJ SOLR
125.0000 mg | Freq: Once | INTRAMUSCULAR | Status: AC
  Administered 2021-11-19: 125 mg via INTRAVENOUS
  Filled 2021-11-19: qty 2

## 2021-11-19 MED ORDER — ENOXAPARIN SODIUM 40 MG/0.4ML IJ SOSY
40.0000 mg | PREFILLED_SYRINGE | INTRAMUSCULAR | Status: DC
  Administered 2021-11-19 – 2021-11-22 (×4): 40 mg via SUBCUTANEOUS
  Filled 2021-11-19 (×4): qty 0.4

## 2021-11-19 MED ORDER — ALBUTEROL SULFATE (2.5 MG/3ML) 0.083% IN NEBU
3.0000 mL | INHALATION_SOLUTION | Freq: Four times a day (QID) | RESPIRATORY_TRACT | Status: DC | PRN
  Administered 2021-11-19: 3 mL via RESPIRATORY_TRACT
  Filled 2021-11-19 (×2): qty 3

## 2021-11-19 MED ORDER — POLYMYXIN B-TRIMETHOPRIM 10000-0.1 UNIT/ML-% OP SOLN
2.0000 [drp] | OPHTHALMIC | Status: DC
  Filled 2021-11-19: qty 10

## 2021-11-19 MED ORDER — AZITHROMYCIN 250 MG PO TABS
500.0000 mg | ORAL_TABLET | Freq: Every day | ORAL | Status: DC
  Administered 2021-11-20 – 2021-11-22 (×3): 500 mg via ORAL
  Filled 2021-11-19 (×3): qty 2

## 2021-11-19 MED ORDER — SODIUM CHLORIDE 0.9 % IV SOLN
2.0000 g | INTRAVENOUS | Status: DC
  Administered 2021-11-20 – 2021-11-22 (×3): 2 g via INTRAVENOUS
  Filled 2021-11-19 (×3): qty 20

## 2021-11-19 MED ORDER — SODIUM CHLORIDE 0.9 % IV SOLN
500.0000 mg | Freq: Once | INTRAVENOUS | Status: AC
  Administered 2021-11-19: 500 mg via INTRAVENOUS
  Filled 2021-11-19: qty 5

## 2021-11-19 MED ORDER — ACETAMINOPHEN 325 MG PO TABS
650.0000 mg | ORAL_TABLET | Freq: Four times a day (QID) | ORAL | Status: DC | PRN
  Administered 2021-11-19: 650 mg via ORAL
  Filled 2021-11-19: qty 2

## 2021-11-19 MED ORDER — ATORVASTATIN CALCIUM 40 MG PO TABS
40.0000 mg | ORAL_TABLET | Freq: Every day | ORAL | Status: DC
  Administered 2021-11-19 – 2021-11-23 (×5): 40 mg via ORAL
  Filled 2021-11-19 (×5): qty 1

## 2021-11-19 NOTE — ED Provider Notes (Signed)
?  Face-to-face evaluation ? ? ?History: Patient presenting for shortness of breath with documented hypoxia on arrival to the ED.  She saw her PCP, 2 days ago for cough, with URI symptoms, and was diagnosed with nonspecific cough and recurrent sinusitis.  She was treated with albuterol and azithromycin. ? ? ?Physical exam: Elderly alert cooperative.  She is tachypneic.  Lungs have decreased air movement and diffuse rhonchi.  No increased work of breathing.  Patient is somewhat confused.   ?MDM: Evaluation for  ?Chief Complaint  ?Patient presents with  ? Shortness of Breath  ?  ? ?Nonspecific symptoms, with hypoxia.  Differential diagnosis includes acute viral illness, including COVID and influenza.  Also concern for possible bacterial pneumonia with left lower lobe infiltrate.  Patient requires advanced imaging with CT to rule out PE, and define pulmonary abnormalities.  Treated with nebulizer MD with some subjective improvement. ? ?Medical screening examination/treatment/procedure(s) were conducted as a shared visit with non-physician practitioner(s) and myself.  I personally evaluated the patient during the encounter ? ?  ?Daleen Bo, MD ?11/20/21 2100 ? ?

## 2021-11-19 NOTE — Progress Notes (Signed)
Patient Name: Melissa Carpenter, female   DOB: [birth year], 88 y.o.  MRN: [id number] ? ?Patient requiring increasing oxygen. Will transition to Bipap and move to stepdown. Will add steroids. ? ?Truett Mainland, DO ? ?

## 2021-11-19 NOTE — Progress Notes (Signed)
?   11/19/21 1940  ?Vitals  ?Temp 99.4 ?F (37.4 ?C)  ?Temp Source Oral  ?BP (!) 137/50  ?MAP (mmHg) 75  ?BP Location Left Arm  ?BP Method Automatic  ?Patient Position (if appropriate) Lying  ?Pulse Rate 81  ?Pulse Rate Source Monitor  ?Resp (!) 30  ?MEWS COLOR  ?MEWS Score Color Yellow  ?Oxygen Therapy  ?SpO2 91 %  ?O2 Device Nasal Cannula  ?O2 Flow Rate (L/min) 10 L/min  ?Pain Assessment  ?Pain Scale 0-10  ?Pain Score 0  ?MEWS Score  ?MEWS Temp 0  ?MEWS Systolic 0  ?MEWS Pulse 0  ?MEWS RR 2  ?MEWS LOC 0  ?MEWS Score 2  ?Provider Notification  ?Provider Name/Title [facility]/ [facility]  ?Date Provider Notified 11/19/21  ?Time Provider Notified 1952  ?Method of Notification Page  ?Notification Reason Other (Comment) ?(Increase in O2 demand, shortness of breath at rest, respiratory rate, temp elevated)  ? ?Patient labored breath, dyspnea at rest. Patient given breathing treatment by respiratory therapy. Patient continues to be short of breath, productive cough with green, thick sputum. Yellow drainage to eyes. Lactic acid 2.2, MD notified.  ?

## 2021-11-19 NOTE — ED Triage Notes (Signed)
Patient brought in via pov from home. Patient seen Thursday by PCP dx with allergies. Patient c/o severe SOB since this morning. Patient satting 83% in triage, placed on [place] ?

## 2021-11-19 NOTE — ED Provider Notes (Signed)
?[facility] ?Provider Note ? ? ?CSN: [id number] ?Arrival date & time: 11/19/21  1245 ? ?  ? ?History ? ?Chief Complaint  ?Patient presents with  ? Shortness of Breath  ? ? ?Melissa Carpenter is a 88 y.o. female. With past medical history of aortic insufficiency, CAD, HTN who presents to the emergency department with shortness of breath.  ? ?Patient states that symptoms began on Thursday with cough and sore throat.  She states that she went to her primary care provider who stated that this was likely allergies.  She states that last night she began having worsening shortness of breath.  She states that she has had cough with green sputum.  States that it is progressively worsened since last night.  She denies fevers.  She denies chest pain or palpitations.  Denies lower extremity swelling, recent travel, immobilization, smoking.  No sick contacts.  She states that she does not wear oxygen at home. ? ? ? ?Shortness of Breath ?Associated symptoms: cough and sore throat   ?Associated symptoms: no abdominal pain, no chest pain, no fever, no vomiting and no wheezing   ? ?  ? ?Home Medications ?Prior to Admission medications   ?Medication Sig Start Date End Date Taking? Authorizing Provider  ?Accu-Chek Softclix Lancets lancets Use to check blood sugars daily 09/20/20   [name], [name], MD  ?albuterol (VENTOLIN HFA) 108 (90 Base) MCG/ACT inhaler Inhale 2 puffs into the lungs every 6 (six) hours as needed. 11/17/21   Loman Brooklyn, FNP  ?amLODipine-atorvastatin (CADUET) 10-40 MG tablet Take 1 tablet by mouth daily. 05/18/21   [name], [name], MD  ?aspirin 81 MG tablet Take 81 mg by mouth daily.    [provider]  ?azithromycin (ZITHROMAX) 250 MG tablet Take 2 tablets on day 1, then 1 tablet daily on days 2 through 5 11/17/21 11/22/21  Loman Brooklyn, FNP  ?Calcium Carbonate-Vitamin D (CALTRATE 600+D) 600-400 MG-UNIT per tablet Take 1 tablet by mouth daily. 03/05/13   Cherre Robins, RPH-CPP   ?cholecalciferol (VITAMIN D) 400 UNITS TABS Take 1,000 Units by mouth.    [provider]  ?estradiol (ESTRACE) 0.1 MG/GM vaginal cream Place 1 Applicatorful vaginally at bedtime. 06/01/21   [name], [name], MD  ?fish oil-omega-3 fatty acids 1000 MG capsule Take 1 g by mouth daily.    [provider]  ?fluticasone (FLONASE) 50 MCG/ACT nasal spray SPRAY 2 SPRAYS IN EACH NOSTRIL EVERY DAY 08/29/21   [name], [name], MD  ?glucose blood test strip Check blood glucose QD and PRN DxE11.9 09/20/20   [name], [name], MD  ?hydrALAZINE (APRESOLINE) 25 MG tablet Take 1 tablet (25 mg total) by mouth in the morning and at bedtime. 08/17/21   Minus Breeding, MD  ?metoprolol succinate (TOPROL-XL) 100 MG 24 hr tablet Take 1 tablet (100 mg total) by mouth daily. TAKE WITH OR IMMEDIATELY FOLLOWING A MEAL. 05/18/21   [name], [name], MD  ?Multiple Vitamin (MULTIVITAMIN) tablet Take 1 tablet by mouth daily.    [provider]  ?telmisartan (MICARDIS) 80 MG tablet TAKE 1 TABLET BY MOUTH EVERY DAY IN THE EVENING 10/20/21   Minus Breeding, MD  ?triamcinolone cream (KENALOG) 0.1 % Apply 1 application topically 2 (two) times daily. 03/23/21   [name], [name], MD  ?   ? ?Allergies    ?Codeine, Forteo [parathyroid hormone (recomb)], Raloxifene, Morphine, Penicillins, Sulfites, and Sulfonamide derivatives   ? ?Review of Systems   ?Review of Systems  ?Constitutional:  Negative for fever.  ?HENT:  Positive for congestion and sore throat.   ?Respiratory:  Positive for cough and shortness of breath. Negative for wheezing.   ?Cardiovascular:  Negative for chest pain, palpitations and leg swelling.  ?Gastrointestinal:  Negative for abdominal pain, nausea and vomiting.  ?Genitourinary:  Negative for dysuria.  ?Neurological:  Negative for dizziness, syncope and light-headedness.  ?All other systems reviewed and are negative. ? ?Physical Exam ?Updated Vital Signs ?BP (!) 161/55   Pulse 83   Temp 99.9 ?F (37.7  ?C) (Oral)   Resp (!) 28   SpO2 (S) (!) 83%  ?Physical Exam ?Vitals and nursing note reviewed.  ?Constitutional:   ?   General: She is in acute distress.  ?   Appearance: She is well-developed. She is ill-appearing. She is not toxic-appearing.  ?HENT:  ?   Head: Normocephalic and atraumatic.  ?   Mouth/Throat:  ?   Mouth: Mucous membranes are dry.  ?   Pharynx: Posterior oropharyngeal erythema present.  ?Eyes:  ?   Extraocular Movements: Extraocular movements intact.  ?   Pupils: Pupils are equal, round, and reactive to light.  ?Neck:  ?   Vascular: No JVD.  ?Cardiovascular:  ?   Rate and Rhythm: Normal rate and regular rhythm.  ?   Pulses: Normal pulses.     ?     Radial pulses are 2+ on the right side and 2+ on the left side.  ?Pulmonary:  ?   Effort: Tachypnea and respiratory distress present.  ?   Breath sounds: Examination of the right-lower field reveals decreased breath sounds and rhonchi. Examination of the left-lower field reveals decreased breath sounds. Decreased breath sounds and rhonchi present. No wheezing.  ?Chest:  ?   Chest wall: No tenderness.  ?Abdominal:  ?   General: There is no distension.  ?   Palpations: Abdomen is soft.  ?   Tenderness: There is no abdominal tenderness.  ?Musculoskeletal:     ?   General: Normal range of motion.  ?   Cervical back: Neck supple.  ?   Right lower leg: No tenderness. 1+ Edema present.  ?   Left lower leg: No tenderness. 1+ Edema present.  ?Skin: ?   General: Skin is warm and dry.  ?   Capillary Refill: Capillary refill takes less than 2 seconds.  ?Neurological:  ?   General: No focal deficit present.  ?   Mental Status: She is alert and oriented to person, place, and time.  ?   Cranial Nerves: Cranial nerves 2-12 are intact.  ?Psychiatric:     ?   Mood and Affect: Mood normal.     ?   Behavior: Behavior normal.     ?   Thought Content: Thought content normal.     ?   Judgment: Judgment normal.  ? ? ?ED Results / Procedures / Treatments   ?Labs ?(all labs  ordered are listed, but only abnormal results are displayed) ?Labs Reviewed  ?BASIC METABOLIC PANEL - Abnormal; Notable for the following components:  ?    Result Value  ? Glucose, Bld 119 (*)   ? Calcium 8.8 (*)   ? All other components within normal limits  ?CBC WITH DIFFERENTIAL/PLATELET - Abnormal; Notable for the following components:  ? RBC 3.74 (*)   ? Hemoglobin 11.5 (*)   ? HCT 34.7 (*)   ? Monocytes Absolute 1.1 (*)   ? All other components within normal limits  ?BLOOD  GAS, ARTERIAL - Abnormal; Notable for the following components:  ? pO2, Arterial 74 (*)   ? All other components within normal limits  ?RESP PANEL BY RT-PCR (FLU A&B, COVID) [id number]  ?TROPONIN I (HIGH SENSITIVITY)  ?TROPONIN I (HIGH SENSITIVITY)  ? ?EKG ?EKG Interpretation ? ?Date/Time:  Saturday November 19 2021 13:00:17 EDT ?Ventricular Rate:  83 ?PR Interval:  143 ?QRS Duration: 98 ?QT Interval:  374 ?QTC Calculation: 440 ?R Axis:   76 ?Text Interpretation: Sinus rhythm Borderline ST depression, lateral leads Baseline wander since last tracing no significant change Confirmed by Daleen Bo [phone]) on 11/19/2021 2:21:43 PM ? ?Radiology ?CT Angio Chest PE W and/or Wo Contrast ? ?Addendum Date: 11/19/2021   ?ADDENDUM REPORT: 11/19/2021 15:15 ADDENDUM: As noted in the body of the report, there is a 1.8 cm short axis subcarinal lymph node, mildly enlarged. While this may be reactive, follow-up CT chest in 3 months recommended to ensure stability. Electronically Signed   By: Misty Stanley M.D.   On: 11/19/2021 15:15  ? ?Result Date: 11/19/2021 ?CLINICAL DATA:  Shortness of breath since this morning. Decreased O2 sats. EXAM: CT ANGIOGRAPHY CHEST WITH CONTRAST TECHNIQUE: Multidetector CT imaging of the chest was performed using the standard protocol during bolus administration of intravenous contrast. Multiplanar CT image reconstructions and MIPs were obtained to evaluate the vascular anatomy. RADIATION DOSE REDUCTION: This exam was performed according  to the departmental dose-optimization program which includes automated exposure control, adjustment of the mA and/or kV according to patient size and/or use of iterative reconstruction technique. CONTRAST:  27m OMNIP

## 2021-11-19 NOTE — ED Notes (Signed)
Pt verbalized no CPR or Vent.  ?

## 2021-11-19 NOTE — H&P (Signed)
?History and Physical  ? ? ?Patient: Melissa Carpenter NIO:[id number] DOB: August 07, 1934 ?DOA: 11/19/2021 ?DOS: the patient was seen and examined on 11/19/2021 ?PCP: [name], [name], MD  ?Patient coming from: Home ? ?Chief Complaint:  ?Chief Complaint  ?Patient presents with  ? Shortness of Breath  ? ?HPI: Melissa Carpenter is a 88 y.o. female with medical history significant of coronary artery disease, prediabetes, hypertension, mitral valve prolapse and mild aortic insufficiency.  Patient seen for productive cough with green sputum that started on Thursday.  She saw her primary care physician who gave her a Z-Pak.  Last night, the patient began to have increasing shortness of breath, which worsened this morning.  She was unable to move around much.  She came to the hospital for evaluation.  Dyspnea worse with exertion and improved with rest.  Denies fevers, chills.  Has decreased appetite. ? ?Additionally, she has drainage from the right eye which is painful and red.  This has been going on for a couple of days. ?Review of Systems: As mentioned in the history of present illness. All other systems reviewed and are negative. ?Past Medical History:  ?Diagnosis Date  ? Allergy   ? Aortic insufficiency   ? Mild, echo, December, 2009  ? Arthritis   ? Carotid artery disease ([place])   ? Cataract   ? Cellulitis   ? Chest pain   ? Catheterization, 2003, no significant CAD  ? Diastolic dysfunction   ? Mild diastolic dysfunction, echo, December, 2012  ? DJD (degenerative joint disease)   ? Dyslipidemia   ? Edema   ? November, 2012  ? GERD (gastroesophageal reflux disease)   ? Hyperlipidemia   ? Hypertension   ? Mitral valve prolapse   ? Echo, 2009, very mild intermittent prolapse of the posterior leaflet, no MR  ? Osteoporosis   ? Prediabetes   ? Tachycardia   ? Nighttime tachycardia, February, 2014  ? Thyroid nodule   ? ?Past Surgical History:  ?Procedure Laterality Date  ? ABDOMINAL HYSTERECTOMY  1964  ? APPENDECTOMY  1946  ? BIOPSY THYROID  Left   ? CARDIAC CATHETERIZATION  sept 2003  ? no significant cad  ? CATARACT EXTRACTION, BILATERAL    ? COLONOSCOPY    ? NASAL SINUS SURGERY    ? TONSILLECTOMY    ? ?Social History:  reports that she has never smoked. She has never used smokeless tobacco. She reports that she does not drink alcohol and does not use drugs. ? ?Allergies  ?Allergen Reactions  ? Codeine Hives  ? Forteo [Parathyroid Hormone (Recomb)] Other (See Comments)  ?  Weakness and calcium increase  ? Raloxifene Other (See Comments)  ?  Eye problems ?Other reaction(s): Other (See Comments) ?Eye problems ?Eye problems  ? Morphine Rash  ? Penicillins Rash  ? Sulfites Rash  ? Sulfonamide Derivatives Rash  ? ? ?Family History  ?Problem Relation Age of Onset  ? Heart attack Mother   ? Hypertension Mother   ? Throat cancer Mother   ?     THROAT / VOCAL CORD  ? Heart disease Mother   ? Hypertension Father   ? Heart disease Father   ? Kidney disease Father   ?     failure  ? Bone cancer Sister   ? Stroke Sister   ? Bone cancer Grandchild   ?     in rib cage  ? Hypertension Sister   ? Metabolic syndrome Sister   ?  pre diabetes  ? GER disease Sister   ? Heart disease Sister   ?     CAD  ? Heart attack Brother   ? Cirrhosis Brother   ? Breast cancer Sister   ? Bone cancer Sister   ? Hyperlipidemia Sister   ? Heart attack Sister   ? Stroke Son   ? Colitis Neg Hx   ? Esophageal cancer Neg Hx   ? Stomach cancer Neg Hx   ? Rectal cancer Neg Hx   ? ? ?Prior to Admission medications   ?Medication Sig Start Date End Date Taking? Authorizing Provider  ?amLODipine-atorvastatin (CADUET) 10-40 MG tablet Take 1 tablet by mouth daily. 05/18/21  Yes [name], [name], MD  ?aspirin 81 MG tablet Take 81 mg by mouth daily.   Yes [provider]  ?azithromycin (ZITHROMAX) 250 MG tablet Take 2 tablets on day 1, then 1 tablet daily on days 2 through 5 11/17/21 11/22/21 Yes Loman Brooklyn, FNP  ?Calcium Carbonate-Vitamin D (CALTRATE 600+D) 600-400 MG-UNIT per tablet  Take 1 tablet by mouth daily. 03/05/13  Yes Cherre Robins, RPH-CPP  ?cholecalciferol (VITAMIN D) 400 UNITS TABS Take 1,000 Units by mouth.   Yes [provider]  ?fish oil-omega-3 fatty acids 1000 MG capsule Take 1 g by mouth daily.   Yes [provider]  ?fluticasone (FLONASE) 50 MCG/ACT nasal spray SPRAY 2 SPRAYS IN EACH NOSTRIL EVERY DAY ?Patient taking differently: Place 2 sprays into both nostrils daily. 08/29/21  Yes [name], [name], MD  ?hydrALAZINE (APRESOLINE) 25 MG tablet Take 1 tablet (25 mg total) by mouth in the morning and at bedtime. 08/17/21  Yes Minus Breeding, MD  ?metoprolol succinate (TOPROL-XL) 100 MG 24 hr tablet Take 1 tablet (100 mg total) by mouth daily. TAKE WITH OR IMMEDIATELY FOLLOWING A MEAL. 05/18/21  Yes [name], [name], MD  ?Multiple Vitamin (MULTIVITAMIN) tablet Take 1 tablet by mouth daily.   Yes [provider]  ?telmisartan (MICARDIS) 80 MG tablet TAKE 1 TABLET BY MOUTH EVERY DAY IN THE EVENING 10/20/21  Yes Hochrein, Jeneen Rinks, MD  ?triamcinolone cream (KENALOG) 0.1 % Apply 1 application topically 2 (two) times daily. 03/23/21  Yes [name], [name], MD  ?Accu-Chek Softclix Lancets lancets Use to check blood sugars daily 09/20/20   [name], [name], MD  ?albuterol (VENTOLIN HFA) 108 (90 Base) MCG/ACT inhaler Inhale 2 puffs into the lungs every 6 (six) hours as needed. 11/17/21   Loman Brooklyn, FNP  ?estradiol (ESTRACE) 0.1 MG/GM vaginal cream Place 1 Applicatorful vaginally at bedtime. ?Patient not taking: Reported on 11/19/2021 06/01/21   [name], [name], MD  ?glucose blood test strip Check blood glucose QD and PRN DxE11.9 09/20/20   [name], [name], MD  ? ? ?Physical Exam: ?Vitals:  ? 11/19/21 1430 11/19/21 1500 11/19/21 1530 11/19/21 1600  ?BP: (!) 149/62 (!) 149/61 (!) 127/54 (!) 126/56  ?Pulse: 82 84 77 73  ?Resp: (!) 28 (!) 31 (!) 28 (!) 23  ?Temp:      ?TempSrc:      ?SpO2: 90% 94% 93% 92%  ? ?General: Elderly female. Awake and alert and  oriented x3. No acute cardiopulmonary distress.  ?HEENT: Normocephalic atraumatic.  Right and left ears normal in appearance.  Pupils equal, round, reactive to light. Extraocular muscles are intact.  Right sclera injected with mucopurulent discharge.  Moist mucosal membranes. No mucosal lesions.  ?Neck: Neck supple without lymphadenopathy. No carotid bruits. No masses palpated.  ?Cardiovascular: Regular rate  with normal S1-S2 sounds. No murmurs, rubs, gallops auscultated. No JVD.  ?Respiratory: Rales in bases bilaterally..  No accessory muscle use. ?Abdomen: Soft, nontender, nondistended. Active bowel sounds. No masses or hepatosplenomegaly  ?Skin: No rashes, lesions, or ulcerations.  Dry, warm to touch. 2+ dorsalis pedis and radial pulses. ?Musculoskeletal: No calf or leg pain. All major joints not erythematous nontender.  No upper or lower joint deformation.  Good ROM.  No contractures  ?Psychiatric: Intact judgment and insight. Pleasant and cooperative. ?Neurologic: No focal neurological deficits. Strength is 5/5 and symmetric in upper and lower extremities.  Cranial nerves II through XII are grossly intact. ? ?Data Reviewed: ?Results for orders placed or performed during the hospital encounter of 11/19/21 (from the past 24 hour(s))  ?Resp Panel by RT-PCR (Flu A&B, Covid) Nasopharyngeal Swab     Status: None  ? Collection Time: 11/19/21  1:05 PM  ? Specimen: Nasopharyngeal Swab; Nasopharyngeal(NP) swabs in vial transport medium  ?Result Value Ref Range  ? SARS Coronavirus 2 by RT PCR NEGATIVE NEGATIVE  ? Influenza A by PCR NEGATIVE NEGATIVE  ? Influenza B by PCR NEGATIVE NEGATIVE  ?Basic metabolic panel     Status: Abnormal  ? Collection Time: 11/19/21  1:11 PM  ?Result Value Ref Range  ? Sodium 135 135 - 145 mmol/L  ? Potassium 3.6 3.5 - 5.1 mmol/L  ? Chloride 102 98 - 111 mmol/L  ? CO2 23 22 - 32 mmol/L  ? Glucose, Bld 119 (H) 70 - 99 mg/dL  ? BUN 14 8 - 23 mg/dL  ? Creatinine, Ser 0.48 0.44 - 1.00 mg/dL  ?  Calcium 8.8 (L) 8.9 - 10.3 mg/dL  ? GFR, Estimated >60 >60 mL/min  ? Anion gap 10 5 - 15  ?Troponin I (High Sensitivity)     Status: None  ? Collection Time: 11/19/21  1:11 PM  ?Result Value Ref Range  ? Tro

## 2021-11-20 DIAGNOSIS — J9601 Acute respiratory failure with hypoxia: Secondary | ICD-10-CM

## 2021-11-20 LAB — BASIC METABOLIC PANEL
Anion gap: 8 (ref 5–15)
BUN: 14 mg/dL (ref 8–23)
CO2: 23 mmol/L (ref 22–32)
Calcium: 8.5 mg/dL — ABNORMAL LOW (ref 8.9–10.3)
Chloride: 106 mmol/L (ref 98–111)
Creatinine, Ser: 0.46 mg/dL (ref 0.44–1.00)
GFR, Estimated: >60 mL/min (ref >60)
Glucose, Bld: 175 mg/dL — ABNORMAL HIGH (ref 70–99)
Potassium: 3.3 mmol/L — ABNORMAL LOW (ref 3.5–5.1)
Sodium: 137 mmol/L (ref 135–145)

## 2021-11-20 LAB — STREP PNEUMONIAE URINARY ANTIGEN: Strep Pneumo Urinary Antigen: NEGATIVE

## 2021-11-20 LAB — GLUCOSE, CAPILLARY
Glucose-Capillary: 133 mg/dL — ABNORMAL HIGH (ref 70–99)
Glucose-Capillary: 185 mg/dL — ABNORMAL HIGH (ref 70–99)

## 2021-11-20 LAB — CBC
HCT: 30.5 % — ABNORMAL LOW (ref 36.0–46.0)
Hemoglobin: 10.2 g/dL — ABNORMAL LOW (ref 12.0–15.0)
MCH: 30.9 pg (ref 26.0–34.0)
MCHC: 33.4 g/dL (ref 30.0–36.0)
MCV: 92.4 fL (ref 80.0–100.0)
Platelets: 158 10*3/uL (ref 150–400)
RBC: 3.3 MIL/uL — ABNORMAL LOW (ref 3.87–5.11)
RDW: 13.2 % (ref 11.5–15.5)
WBC: 8.4 10*3/uL (ref 4.0–10.5)
nRBC: 0 % (ref 0.0–0.2)

## 2021-11-20 LAB — MRSA NEXT GEN BY PCR, NASAL: MRSA by PCR Next Gen: NOT DETECTED

## 2021-11-20 LAB — PROCALCITONIN: Procalcitonin: <0.1 ng/mL

## 2021-11-20 MED ORDER — PHENOL 1.4 % MT LIQD
1.0000 | OROMUCOSAL | Status: DC | PRN
  Administered 2021-11-20: 1 via OROMUCOSAL
  Filled 2021-11-20: qty 177

## 2021-11-20 MED ORDER — POTASSIUM CHLORIDE CRYS ER 20 MEQ PO TBCR
40.0000 meq | EXTENDED_RELEASE_TABLET | ORAL | Status: AC
Start: 2021-11-20 — End: 2021-11-20
  Administered 2021-11-20 (×2): 40 meq via ORAL
  Filled 2021-11-20 (×2): qty 2

## 2021-11-20 MED ORDER — ORAL CARE MOUTH RINSE
15.0000 mL | Freq: Two times a day (BID) | OROMUCOSAL | Status: DC
  Administered 2021-11-20 – 2021-11-21 (×4): 15 mL via OROMUCOSAL

## 2021-11-20 MED ORDER — NEOMYCIN-POLYMYXIN-DEXAMETH 3.5-10000-0.1 OP SUSP
2.0000 [drp] | OPHTHALMIC | Status: DC
  Administered 2021-11-20 – 2021-11-23 (×18): 2 [drp] via OPHTHALMIC
  Filled 2021-11-20 (×2): qty 5

## 2021-11-20 MED ORDER — INSULIN ASPART 100 UNIT/ML IJ SOLN: 0.0000 [IU] | Freq: Every day | INTRAMUSCULAR | Status: DC

## 2021-11-20 MED ORDER — CHLORHEXIDINE GLUCONATE 0.12 % MT SOLN
15.0000 mL | Freq: Two times a day (BID) | OROMUCOSAL | Status: DC
  Administered 2021-11-20 – 2021-11-22 (×5): 15 mL via OROMUCOSAL
  Filled 2021-11-20 (×5): qty 15

## 2021-11-20 MED ORDER — INSULIN ASPART 100 UNIT/ML IJ SOLN
0.0000 [IU] | Freq: Three times a day (TID) | INTRAMUSCULAR | Status: DC
  Administered 2021-11-21 – 2021-11-23 (×3): 1 [IU] via SUBCUTANEOUS

## 2021-11-20 NOTE — Progress Notes (Signed)
Pt. On 7L of oxygen via [place] at shift change. Pt. Started to de-sat and was increased to 10L with oxygen sat at 89-91%. MD notified. Pt. Transferred to ICU via bed. Report given at bedside to [place], [place]. ?

## 2021-11-20 NOTE — Progress Notes (Addendum)
?PROGRESS NOTE ? ? ? ? ?Melissa Carpenter, is a 88 y.o. female, DOB - 01/18/1934, [id number] ? ?Admit date - 11/19/2021   Admitting Physician Melissa Mainland, DO ? ?Outpatient Primary MD for the patient is [name], Melissa Kaufmann, MD ? ?LOS - 1 ? ?Chief Complaint  ?Patient presents with  ? Shortness of Breath  ?    ? ? ?Brief Narrative:  ?a 88 y.o. female with medical history significant of coronary artery disease, prediabetes, hypertension, mitral valve prolapse and mild aortic insufficiency admitted on 11/19/2021 with acute hypoxic respiratory failure requiring BiPAP and high flow oxygen in the setting of bilateral pneumonia ? ?  ?-Assessment and Plan: ? ?1)CAP--- bilateral/multifocal pneumonia, does not meet sepsis criteria ?-Strep pneumo antigen negative Legionella antigen pain ?-COVID and flu negative ? continue Rocephin/azithromycin, bronchodilators and mucolytics as ordered ? ?2)Acute Hypoxic Respiratory Failure--secondary to #1 above  ?--required BiPAP,  ?-currently on 10 L of oxygen via nasal cannula ?-Solu-Medrol and bronchodilators as ordered ?- ?3)Abnormal chest imaging findings--- CT angio chest shows  7 mm right middle lobe pulmonary nodule/7 mm right solid pulmonary nodule and 1.8 cm short axis subcarinal lymph node, mildly enlarged. While this may be reactive, ?--follow-up CT chest in 3 months recommended to ensure stability ?-1.7 cm right thyroid nodule--- thyroid ultrasound recommended as outpatient ? ?4)HTN--- stable, continue amlodipine 10 mg daily,, Toprol-XL 100 mg daily,  hydralazine and Avapro ? ?5)Hyperglycemia--A1c from 09/14/2021 was 6.0 ?-Suspect stress and steroid-induced hyperglycemia ?Use Novolog/Humalog Sliding scale insulin with Accu-Cheks/Fingersticks as ordered  ? ?6)Acute Anemia----Hgb down to 10.2 after hydration suspect some component of hemodilution no evidence of ongoing bleeding monitor closely ? ?7)Hypokalemia--- replace and recheck ? ?Disposition/Need for in-Hospital Stay- patient unable  to be discharged at this time due to --acute hypoxic respiratory failure requiring BiPAP and high flow oxygen due to pneumonia quiring antibiotics and fluids* ? ?Status is: Inpatient  ? ?Disposition: The patient is from: Home ?             Anticipated d/c is to: Home ?             Anticipated d/c date is: 2 days ?             Patient currently is not medically stable to d/c. ?Barriers: Not Clinically Stable-  ? ?Code Status :  -  Code Status: DNR  ? ?Family Communication:    NA (patient is alert, awake and coherent)  ? ?DVT Prophylaxis  :   - SCDs  enoxaparin (LOVENOX) injection 40 mg Start: 11/19/21 1730 ? ? ?Lab Results  ?Component Value Date  ? PLT 158 11/20/2021  ? ?Inpatient Medications ? ?Scheduled Meds: ? amLODipine  10 mg Oral Daily  ? aspirin  81 mg Oral Daily  ? atorvastatin  40 mg Oral Daily  ? azithromycin  500 mg Oral Q1400  ? chlorhexidine  15 mL Mouth Rinse BID  ? Chlorhexidine Gluconate Cloth  6 each Topical Q0600  ? enoxaparin (LOVENOX) injection  40 mg Subcutaneous Q24H  ? fluticasone  2 spray Each Nare Daily  ? guaiFENesin  600 mg Oral BID  ? hydrALAZINE  25 mg Oral BID  ? insulin aspart  0-5 Units Subcutaneous QHS  ? insulin aspart  0-6 Units Subcutaneous TID WC  ? ipratropium-albuterol  3 mL Nebulization TID  ? irbesartan  300 mg Oral Daily  ? mouth rinse  15 mL Mouth Rinse q12n4p  ? methylPREDNISolone (SOLU-MEDROL) injection  60 mg Intravenous Q12H  ?  metoprolol succinate  100 mg Oral Daily  ? neomycin-polymyxin b-dexamethasone  2 drop Right Eye Q4H  ? ?Continuous Infusions: ? cefTRIAXone (ROCEPHIN)  IV 200 mL/hr at 11/20/21 1511  ? ?PRN Meds:.acetaminophen **OR** acetaminophen, albuterol, LORazepam, ondansetron **OR** ondansetron (ZOFRAN) IV ? ? ?Anti-infectives (From admission, onward)  ? ? Start     Dose/Rate Route Frequency Ordered Stop  ? 11/20/21 1500  cefTRIAXone (ROCEPHIN) 2 g in sodium chloride 0.9 % 100 mL IVPB       ? 2 g ?200 mL/hr over 30 Minutes Intravenous Every 24 hours 11/19/21  1639 11/24/21 1459  ? 11/20/21 1500  azithromycin (ZITHROMAX) tablet 500 mg       ? 500 mg Oral Daily 11/19/21 1639 11/24/21 1359  ? 11/19/21 1530  cefTRIAXone (ROCEPHIN) 1 g in sodium chloride 0.9 % 100 mL IVPB       ? 1 g ?200 mL/hr over 30 Minutes Intravenous  Once 11/19/21 1520 11/19/21 1819  ? 11/19/21 1430  cefTRIAXone (ROCEPHIN) 1 g in sodium chloride 0.9 % 100 mL IVPB       ? 1 g ?200 mL/hr over 30 Minutes Intravenous  Once 11/19/21 1416 11/19/21 1556  ? 11/19/21 1430  azithromycin (ZITHROMAX) 500 mg in sodium chloride 0.9 % 250 mL IVPB       ? 500 mg ?250 mL/hr over 60 Minutes Intravenous  Once 11/19/21 1416 11/19/21 1620  ? ?  ? ?  ? ?Subjective: ?Melissa Carpenter today has no fevers, no emesis,  No chest pain,   ?Cough and dyspnea persist ?-Significant hypoxia currently on 10 L of oxygen  ?- ? ?Objective: ?Vitals:  ? 11/20/21 1200 11/20/21 1330 11/20/21 1337 11/20/21 1400  ?BP: (!) 116/35   (!) 125/45  ?Pulse: 69 75  83  ?Resp: (!) 22 (!) 28  (!) 25  ?Temp:      ?TempSrc:      ?SpO2: 96% 90% 92% 93%  ?Weight:      ?Height:      ? ? ?Intake/Output Summary (Last 24 hours) at 11/20/2021 1530 ?Last data filed at 11/20/2021 1511 ?Gross per 24 hour  ?Intake 631.85 ml  ?Output 750 ml  ?Net -118.15 ml  ? ?Filed Weights  ? 11/19/21 2150 11/20/21 0500  ?Weight: 63.6 kg 63.6 kg  ? ? ?Physical Exam ? ?Gen:- Awake Alert, conversational dyspnea ?HEENT:- [place].AT, No sclera icterus ?Neck-Supple Neck,No JVD,.  ?Lungs-diminished breath sounds, scattered rhonchi and wheezes bilaterally  ?CV- S1, S2 normal, regular , 3/6 SM ?Abd-  +ve B.Sounds, Abd Soft, No tenderness,    ?Extremity/Skin:- No  edema, pedal pulses present  ?Psych-affect is appropriate, oriented x3 ?Neuro-generalized weakness, no new focal deficits, no tremors ? ?Data Reviewed: I have personally reviewed following labs and imaging studies ? ?CBC: ?Recent Labs  ?Lab 11/19/21 ?1311 11/20/21 ?0530  ?WBC 8.4 8.4  ?NEUTROABS 6.2  --   ?HGB 11.5* 10.2*  ?HCT 34.7* 30.5*  ?MCV  92.8 92.4  ?PLT 177 158  ? ?Basic Metabolic Panel: ?Recent Labs  ?Lab 11/19/21 ?1311 11/20/21 ?0530  ?NA 135 137  ?K 3.6 3.3*  ?CL 102 106  ?CO2 23 23  ?GLUCOSE 119* 175*  ?BUN 14 14  ?CREATININE 0.48 0.46  ?CALCIUM 8.8* 8.5*  ? ?GFR: ?Estimated Creatinine Clearance: 43.9 mL/min (by C-G formula based on SCr of 0.46 mg/dL). ?Liver Function Tests: ?No results for input(s): AST, ALT, ALKPHOS, BILITOT, PROT, ALBUMIN in the last 168 hours. ?Cardiac Enzymes: ?No results for input(s): CKTOTAL, CKMB,  CKMBINDEX, TROPONINI in the last 168 hours. ?BNP (last 3 results) ?No results for input(s): PROBNP in the last 8760 hours. ?HbA1C: ?No results for input(s): HGBA1C in the last 72 hours. ?Sepsis Labs: ?'@LABRCNTIP'$ (procalcitonin:4,lacticidven:4) ?) ?Recent Results (from the past 240 hour(s))  ?Novel Coronavirus, NAA ([facility])     Status: None  ? Collection Time: 11/17/21  3:37 PM  ? Specimen: Nasopharyngeal(NP) swabs in vial transport medium  ?Result Value Ref Range Status  ? SARS-CoV-2, NAA Not Detected Not Detected Final  ?  Comment: This nucleic acid amplification test was developed and its performance ?characteristics determined by [employer]. Nucleic acid ?amplification tests include RT-PCR and TMA. This test has not been ?FDA cleared or approved. This test has been authorized by FDA under ?an Emergency Use Authorization (EUA). This test is only authorized ?for the duration of time the declaration that circumstances exist ?justifying the authorization of the emergency use of in vitro ?diagnostic tests for detection of SARS-CoV-2 virus and/or diagnosis ?of COVID-19 infection under section 564(b)(1) of the Act, 21 U.S.C. ?360bbb-3(b) (1), unless the authorization is terminated or revoked ?sooner. ?When diagnostic testing is negative, the possibility of a false ?negative result should be considered in the context of a patient's ?recent exposures and the presence of clinical signs and symptoms ?consistent with COVID-19.  An individual without symptoms of COVID-19 ?and who is not shedding SARS-CoV-2 virus wo uld expect to have a ?negative (not detected) result in this assay. ?  ?Resp Panel by RT-PCR (Flu A&B, Covid)

## 2021-11-20 NOTE — TOC Initial Note (Signed)
?  Transition of Care (TOC) Screening Note ? ? ?Patient Details  ?Name: Melissa Carpenter ?Date of Birth: 02-29-1934 ? ? ?Transition of Care (TOC) CM/SW Contact:    ?Kerin Salen, RN ?Phone Number: ?11/20/2021, 11:41 AM ? ? ? ?Transition of Care Department [facility]) has reviewed patient and no TOC needs have been identified at this time. We will continue to monitor patient advancement through interdisciplinary progression rounds. If new patient transition needs arise, please place a TOC consult. ? ?             ? ? ?  ?  ? ? ?Patient Goals and CMS Choice ?  ?  ?  ? ?Expected Discharge Plan and Services ?  ?  ?  ?  ?  ?                ?  ?  ?  ?  ?  ?  ?  ?  ?  ?  ? ?Prior Living Arrangements/Services ?  ?  ?  ?       ?  ?  ?  ?  ? ?Activities of Daily Living ?Home Assistive Devices/Equipment: None ?ADL Screening (condition at time of admission) ?Patient's cognitive ability adequate to safely complete daily activities?: Yes ?Is the patient deaf or have difficulty hearing?: No ?Does the patient have difficulty seeing, even when wearing glasses/contacts?: No ?Does the patient have difficulty concentrating, remembering, or making decisions?: No ?Patient able to express need for assistance with ADLs?: Yes ?Does the patient have difficulty dressing or bathing?: No ?Independently performs ADLs?: Yes (appropriate for developmental age) ?Does the patient have difficulty walking or climbing stairs?: No ?Weakness of Legs: None ?Weakness of Arms/Hands: None ? ?Permission Sought/Granted ?  ?  ?   ?   ?   ?   ? ?Emotional Assessment ?  ?  ?  ?  ?  ?  ? ?Admission diagnosis:  Hypoxia [R09.02] ?Acute respiratory failure with hypoxia ([place]) [J96.01] ?Community acquired pneumonia, unspecified laterality [J18.9] ?Patient Active Problem List  ? Diagnosis Date Noted  ? Acute respiratory failure with hypoxia ([place]) 11/19/2021  ? CAP (community acquired pneumonia) 11/19/2021  ? Murmur 12/02/2019  ? Impacted cerumen of both ears 09/18/2018  ? Normal  coronary arteries 06/20/2017  ? Pulsatile tinnitus 06/14/2017  ? Tinnitus, bilateral 06/14/2017  ? TMJ (sprain of temporomandibular joint), initial encounter 06/14/2017  ? Eustachian tube dysfunction, bilateral 03/02/2017  ? Palpitations 02/21/2017  ? Mild aortic stenosis 02/21/2017  ? Pre-diabetes 02/01/2016  ? Chronic venous insufficiency 09/14/2015  ? Metabolic syndrome 99/83/3825  ? Gastroesophageal reflux disease with hiatal hernia 07/21/2013  ? Osteoporosis 03/05/2013  ? Carotid artery disease ([place])   ? Dyslipidemia   ? Mitral valve prolapse   ? Essential hypertension   ? ?PCP:  [name], [name], MD ?Pharmacy:   ?[id number] [place], [place] - [id number]?Phone: [phone]: [phone]? ? ? ? ?Social Determinants of Health (SDOH) Interventions ?  ? ?Readmission Risk Interventions ?   ? View : No data to display.  ?  ?  ?  ? ? ? ?

## 2021-11-20 NOTE — Plan of Care (Signed)

## 2021-11-21 ENCOUNTER — Inpatient Hospital Stay (HOSPITAL_COMMUNITY): Payer: Medicare Other

## 2021-11-21 DIAGNOSIS — J9601 Acute respiratory failure with hypoxia: Secondary | ICD-10-CM | POA: Diagnosis not present

## 2021-11-21 LAB — RENAL FUNCTION PANEL
Albumin: 2.9 g/dL — ABNORMAL LOW (ref 3.5–5.0)
Anion gap: 8 (ref 5–15)
BUN: 26 mg/dL — ABNORMAL HIGH (ref 8–23)
CO2: 21 mmol/L — ABNORMAL LOW (ref 22–32)
Calcium: 8.8 mg/dL — ABNORMAL LOW (ref 8.9–10.3)
Chloride: 109 mmol/L (ref 98–111)
Creatinine, Ser: 0.61 mg/dL (ref 0.44–1.00)
GFR, Estimated: >60 mL/min (ref >60)
Glucose, Bld: 163 mg/dL — ABNORMAL HIGH (ref 70–99)
Phosphorus: 3 mg/dL (ref 2.5–4.6)
Potassium: 4.7 mmol/L (ref 3.5–5.1)
Sodium: 138 mmol/L (ref 135–145)

## 2021-11-21 LAB — CBC
HCT: 31.8 % — ABNORMAL LOW (ref 36.0–46.0)
Hemoglobin: 10.3 g/dL — ABNORMAL LOW (ref 12.0–15.0)
MCH: 30.4 pg (ref 26.0–34.0)
MCHC: 32.4 g/dL (ref 30.0–36.0)
MCV: 93.8 fL (ref 80.0–100.0)
Platelets: 191 10*3/uL (ref 150–400)
RBC: 3.39 MIL/uL — ABNORMAL LOW (ref 3.87–5.11)
RDW: 13.2 % (ref 11.5–15.5)
WBC: 10.9 10*3/uL — ABNORMAL HIGH (ref 4.0–10.5)
nRBC: 0 % (ref 0.0–0.2)

## 2021-11-21 LAB — EXPECTORATED SPUTUM ASSESSMENT W GRAM STAIN, RFLX TO RESP C

## 2021-11-21 LAB — GLUCOSE, CAPILLARY
Glucose-Capillary: 146 mg/dL — ABNORMAL HIGH (ref 70–99)
Glucose-Capillary: 189 mg/dL — ABNORMAL HIGH (ref 70–99)
Glucose-Capillary: 156 mg/dL — ABNORMAL HIGH (ref 70–99)
Glucose-Capillary: 154 mg/dL — ABNORMAL HIGH (ref 70–99)

## 2021-11-21 LAB — BRAIN NATRIURETIC PEPTIDE: B Natriuretic Peptide: 244 pg/mL — ABNORMAL HIGH (ref 0.0–100.0)

## 2021-11-21 IMAGING — DX DG CHEST 1V PORT
1 series · 1 of 1 positions shown · non-contrast
Comparison: [DATE] radiography and CT

CLINICAL DATA: Shortness of breath.  Hypertension.

EXAM:
PORTABLE CHEST 1 VIEW

[chest ap]
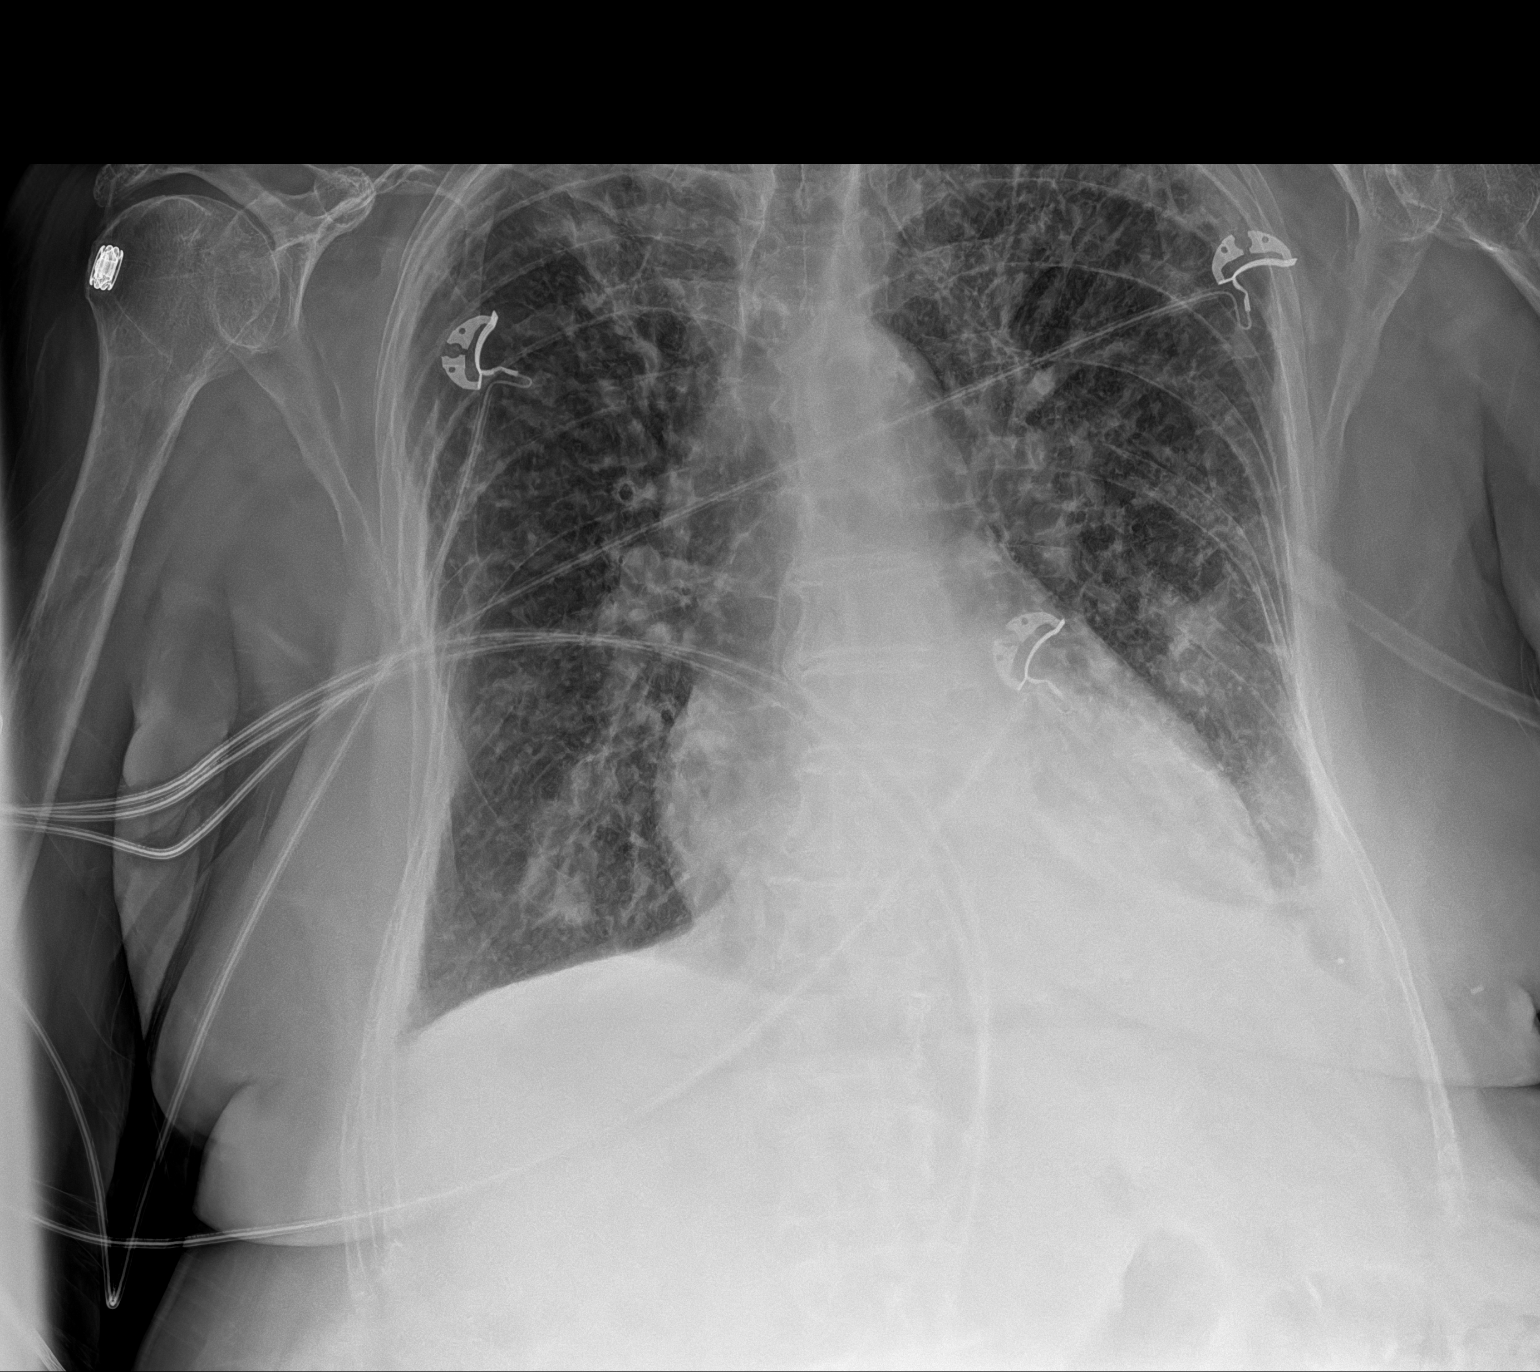

[1 of 1 positions shown; findings below may reference images not displayed]

FINDINGS: Show mild cardiomegaly as seen previously. Aortic atherosclerotic
calcification. Small pleural effusion on the left with left base
atelectasis and or patchy pneumonia. Lesser patchy density at the
right lung base. Interstitial pulmonary prominence and probable
pulmonary venous hypertension suggesting a degree of congestive
heart failure. Coexistent infectious pneumonia not excluded.
IMPRESSION: Probable congestive heart failure pattern. Pulmonary venous
hypertension with interstitial edema and left effusion. Patchy
densities at the lung bases could be atelectasis or bibasilar
pneumonia.

## 2021-11-21 MED ORDER — METHYLPREDNISOLONE SODIUM SUCC 125 MG IJ SOLR
80.0000 mg | INTRAMUSCULAR | Status: DC
  Administered 2021-11-22: 80 mg via INTRAVENOUS
  Filled 2021-11-21: qty 2

## 2021-11-21 MED ORDER — FUROSEMIDE 10 MG/ML IJ SOLN
20.0000 mg | Freq: Two times a day (BID) | INTRAMUSCULAR | Status: DC
  Administered 2021-11-21 – 2021-11-23 (×5): 20 mg via INTRAVENOUS
  Filled 2021-11-21 (×5): qty 2

## 2021-11-21 MED ORDER — SODIUM CHLORIDE 0.9 % IV SOLN: INTRAVENOUS | Status: DC | PRN

## 2021-11-21 MED ORDER — CHLORHEXIDINE GLUCONATE CLOTH 2 % EX PADS
6.0000 | MEDICATED_PAD | Freq: Every day | CUTANEOUS | Status: DC
  Administered 2021-11-21: 6 via TOPICAL

## 2021-11-21 NOTE — Progress Notes (Signed)
?PROGRESS NOTE ? ? ? ? ?Melissa Carpenter, is a 88 y.o. female, DOB - Feb 08, 1934, [id number] ? ?Admit date - 11/19/2021   Admitting Physician Truett Mainland, DO ? ?Outpatient Primary MD for the patient is [name], [name], MD ? ?LOS - 2 ? ?Chief Complaint  ?Patient presents with  ? Shortness of Breath  ?    ? ? ?Brief Narrative:  ?a 88 y.o. female with medical history significant of coronary artery disease, prediabetes, hypertension, mitral valve prolapse and mild aortic insufficiency admitted on 11/19/2021 with acute hypoxic respiratory failure requiring BiPAP and high flow oxygen in the setting of bilateral pneumonia ? ?  ?-Assessment and Plan: ? ?1)CAP--- bilateral/multifocal pneumonia, does not meet sepsis criteria ?-Strep pneumo antigen negative Legionella antigen pain ?-COVID and flu negative ? continue Rocephin/azithromycin, bronchodilators and mucolytics as ordered ? ?2)Acute Hypoxic Respiratory Failure--secondary to #1 above  ?--required BiPAP,  ?Hypoxia improving oxygen has been weaned down to 4 to 5 L ?-Solu-Medrol and bronchodilators as ordered ?- ?3)Abnormal chest imaging findings--- CT angio chest shows  7 mm right middle lobe pulmonary nodule/7 mm right solid pulmonary nodule and 1.8 cm short axis subcarinal lymph node, mildly enlarged. While this may be reactive, ?--follow-up CT chest in 3 months recommended to ensure stability ?-1.7 cm right thyroid nodule--- thyroid ultrasound recommended as outpatient ? ?4)HTN--- stable, continue amlodipine 10 mg daily,, Toprol-XL 100 mg daily,  hydralazine and Avapro ? ?5)Hyperglycemia--A1c from 09/14/2021 was 6.0 ?-Suspect stress and steroid-induced hyperglycemia ?Use Novolog/Humalog Sliding scale insulin with Accu-Cheks/Fingersticks as ordered  ? ?6)Acute Anemia----Hgb down to 10.2 after hydration suspect some component of hemodilution no evidence of ongoing bleeding monitor closely ? ?7)Hypokalemia--- replace and recheck ? ?8)HFpEF- CHF--suspect some degree of  volume overload, ?-BNP 244, chest x-ray on 11/21/2021 consistent with CHF ?-Echo with EF of greater than 75% with moderate aortic stenosis ?-Gentle diuresis advised ? ?Disposition/Need for in-Hospital Stay- patient unable to be discharged at this time due to --acute hypoxic respiratory failure requiring BiPAP and high flow oxygen due to pneumonia quiring antibiotics and fluids* ? ?Status is: Inpatient  ? ?Disposition: The patient is from: Home ?             Anticipated d/c is to: Home ?             Anticipated d/c date is: 2 days ?             Patient currently is not medically stable to d/c. ?Barriers: Not Clinically Stable-  ? ?Code Status :  -  Code Status: DNR  ? ?Family Communication:    NA (patient is alert, awake and coherent)  ? ?DVT Prophylaxis  :   - SCDs  enoxaparin (LOVENOX) injection 40 mg Start: 11/19/21 1730 ? ? ?Lab Results  ?Component Value Date  ? PLT 191 11/21/2021  ? ?Inpatient Medications ? ?Scheduled Meds: ? amLODipine  10 mg Oral Daily  ? aspirin  81 mg Oral Daily  ? atorvastatin  40 mg Oral Daily  ? azithromycin  500 mg Oral Q1400  ? chlorhexidine  15 mL Mouth Rinse BID  ? Chlorhexidine Gluconate Cloth  6 each Topical Daily  ? enoxaparin (LOVENOX) injection  40 mg Subcutaneous Q24H  ? fluticasone  2 spray Each Nare Daily  ? furosemide  20 mg Intravenous BID  ? guaiFENesin  600 mg Oral BID  ? hydrALAZINE  25 mg Oral BID  ? insulin aspart  0-5 Units Subcutaneous QHS  ? insulin aspart  0-6 Units Subcutaneous TID WC  ? ipratropium-albuterol  3 mL Nebulization TID  ? irbesartan  300 mg Oral Daily  ? mouth rinse  15 mL Mouth Rinse q12n4p  ? methylPREDNISolone (SOLU-MEDROL) injection  60 mg Intravenous Q12H  ? metoprolol succinate  100 mg Oral Daily  ? neomycin-polymyxin b-dexamethasone  2 drop Right Eye Q4H  ? ?Continuous Infusions: ? sodium chloride Stopped (11/21/21 1513)  ? cefTRIAXone (ROCEPHIN)  IV Stopped (11/21/21 1444)  ? ?PRN Meds:.sodium chloride, acetaminophen **OR** acetaminophen,  albuterol, LORazepam, ondansetron **OR** ondansetron (ZOFRAN) IV, phenol ? ? ?Anti-infectives (From admission, onward)  ? ? Start     Dose/Rate Route Frequency Ordered Stop  ? 11/20/21 1500  cefTRIAXone (ROCEPHIN) 2 g in sodium chloride 0.9 % 100 mL IVPB       ? 2 g ?200 mL/hr over 30 Minutes Intravenous Every 24 hours 11/19/21 1639 11/24/21 1459  ? 11/20/21 1500  azithromycin (ZITHROMAX) tablet 500 mg       ? 500 mg Oral Daily 11/19/21 1639 11/24/21 1359  ? 11/19/21 1530  cefTRIAXone (ROCEPHIN) 1 g in sodium chloride 0.9 % 100 mL IVPB       ? 1 g ?200 mL/hr over 30 Minutes Intravenous  Once 11/19/21 1520 11/19/21 1819  ? 11/19/21 1430  cefTRIAXone (ROCEPHIN) 1 g in sodium chloride 0.9 % 100 mL IVPB       ? 1 g ?200 mL/hr over 30 Minutes Intravenous  Once 11/19/21 1416 11/19/21 1556  ? 11/19/21 1430  azithromycin (ZITHROMAX) 500 mg in sodium chloride 0.9 % 250 mL IVPB       ? 500 mg ?250 mL/hr over 60 Minutes Intravenous  Once 11/19/21 1416 11/19/21 1620  ? ?  ? ?  ? ?Subjective: ?Melissa Carpenter today has no fevers, no emesis,  No chest pain,   ? ?-Cough and dyspnea improving ?-Hypoxia improving patient has been weaned down to 4 to 5 L of oxygen via nasal cannula at this ?- ? ?Objective: ?Vitals:  ? 11/21/21 1600 11/21/21 1628 11/21/21 1700 11/21/21 1800  ?BP: (!) 123/46  (!) 144/51 (!) 132/50  ?Pulse: 72 67 68 74  ?Resp: 11 (!) 23 20 (!) 21  ?Temp:  98.2 ?F (36.8 ?C)    ?TempSrc:  Oral    ?SpO2: 97% 98% 97% 97%  ?Weight:      ?Height:      ? ? ?Intake/Output Summary (Last 24 hours) at 11/21/2021 1942 ?Last data filed at 11/21/2021 1700 ?Gross per 24 hour  ?Intake 205.05 ml  ?Output 300 ml  ?Net -94.95 ml  ? ?Filed Weights  ? 11/19/21 2150 11/20/21 0500  ?Weight: 63.6 kg 63.6 kg  ? ? ?Physical Exam ? ?Gen:- Awake Alert, dyspnea on exertion noted ?HEENT:- .AT, No sclera icterus ?Neck-Supple Neck,No JVD,.  ?Lungs-improving air movement, no wheezing  ?CV- S1, S2 normal, regular , 3/6 SM ?Abd-  +ve B.Sounds, Abd Soft, No  tenderness,    ?Extremity/Skin:- No  edema, pedal pulses present  ?Psych-affect is appropriate, oriented x3 ?Neuro-generalized weakness, no new focal deficits, no tremors ? ?Data Reviewed: I have personally reviewed following labs and imaging studies ? ?CBC: ?Recent Labs  ?Lab 11/19/21 ?1311 11/20/21 ?0530 11/21/21 ?0429  ?WBC 8.4 8.4 10.9*  ?NEUTROABS 6.2  --   --   ?HGB 11.5* 10.2* 10.3*  ?HCT 34.7* 30.5* 31.8*  ?MCV 92.8 92.4 93.8  ?PLT 177 158 191  ? ?Basic Metabolic Panel: ?Recent Labs  ?Lab 11/19/21 ?1311 11/20/21 ?0530 11/21/21 ?  0429  ?NA 135 137 138  ?K 3.6 3.3* 4.7  ?CL 102 106 109  ?CO2 23 23 21*  ?GLUCOSE 119* 175* 163*  ?BUN 14 14 26*  ?CREATININE 0.48 0.46 0.61  ?CALCIUM 8.8* 8.5* 8.8*  ?PHOS  --   --  3.0  ? ?GFR: ?Estimated Creatinine Clearance: 43.9 mL/min (by C-G formula based on SCr of 0.61 mg/dL). ?Liver Function Tests: ?Recent Labs  ?Lab 11/21/21 ?0429  ?ALBUMIN 2.9*  ? ?Cardiac Enzymes: ?No results for input(s): CKTOTAL, CKMB, CKMBINDEX, TROPONINI in the last 168 hours. ?BNP (last 3 results) ?No results for input(s): PROBNP in the last 8760 hours. ?HbA1C: ?No results for input(s): HGBA1C in the last 72 hours. ?Sepsis Labs: ?'@LABRCNTIP'$ (procalcitonin:4,lacticidven:4) ?) ?Recent Results (from the past 240 hour(s))  ?Novel Coronavirus, NAA ([facility])     Status: None  ? Collection Time: 11/17/21  3:37 PM  ? Specimen: Nasopharyngeal(NP) swabs in vial transport medium  ?Result Value Ref Range Status  ? SARS-CoV-2, NAA Not Detected Not Detected Final  ?  Comment: This nucleic acid amplification test was developed and its performance ?characteristics determined by [employer]. Nucleic acid ?amplification tests include RT-PCR and TMA. This test has not been ?FDA cleared or approved. This test has been authorized by FDA under ?an Emergency Use Authorization (EUA). This test is only authorized ?for the duration of time the declaration that circumstances exist ?justifying the authorization of the  emergency use of in vitro ?diagnostic tests for detection of SARS-CoV-2 virus and/or diagnosis ?of COVID-19 infection under section 564(b)(1) of the Act, 21 U.S.C. ?360bbb-3(b) (1), unless the authorization is termi

## 2021-11-21 NOTE — Plan of Care (Signed)
BiPAP overnight, now on [place] O2. No acute distress this AM. ? ? ?Problem: Activity: ?Goal: Ability to tolerate increased activity will improve ?Outcome: Progressing ?  ?Problem: Clinical Measurements: ?Goal: Ability to maintain a body temperature in the normal range will improve ?Outcome: Progressing ?  ?Problem: Respiratory: ?Goal: Ability to maintain adequate ventilation will improve ?Outcome: Progressing ?Goal: Ability to maintain a clear airway will improve ?Outcome: Progressing ?  ?Problem: Education: ?Goal: Knowledge of General Education information will improve ?Description: Including pain rating scale, medication(s)/side effects and non-pharmacologic comfort measures ?Outcome: Progressing ?  ?Problem: Health Behavior/Discharge Planning: ?Goal: Ability to manage health-related needs will improve ?Outcome: Progressing ?  ?Problem: Clinical Measurements: ?Goal: Ability to maintain clinical measurements within normal limits will improve ?Outcome: Progressing ?Goal: Will remain free from infection ?Outcome: Progressing ?Goal: Diagnostic test results will improve ?Outcome: Progressing ?Goal: Respiratory complications will improve ?Outcome: Progressing ?Goal: Cardiovascular complication will be avoided ?Outcome: Progressing ?  ?Problem: Activity: ?Goal: Risk for activity intolerance will decrease ?Outcome: Progressing ?  ?Problem: Nutrition: ?Goal: Adequate nutrition will be maintained ?Outcome: Progressing ?  ?Problem: Coping: ?Goal: Level of anxiety will decrease ?Outcome: Progressing ?  ?Problem: Elimination: ?Goal: Will not experience complications related to bowel motility ?Outcome: Progressing ?Goal: Will not experience complications related to urinary retention ?Outcome: Progressing ?  ?Problem: Pain Managment: ?Goal: General experience of comfort will improve ?Outcome: Progressing ?  ?Problem: Safety: ?Goal: Ability to remain free from injury will improve ?Outcome: Progressing ?  ?Problem: Skin  Integrity: ?Goal: Risk for impaired skin integrity will decrease ?Outcome: Progressing ?  ?

## 2021-11-22 DIAGNOSIS — J9601 Acute respiratory failure with hypoxia: Secondary | ICD-10-CM | POA: Diagnosis not present

## 2021-11-22 LAB — BASIC METABOLIC PANEL
Anion gap: 9 (ref 5–15)
BUN: 26 mg/dL — ABNORMAL HIGH (ref 8–23)
CO2: 22 mmol/L (ref 22–32)
Calcium: 8.9 mg/dL (ref 8.9–10.3)
Chloride: 108 mmol/L (ref 98–111)
Creatinine, Ser: 0.56 mg/dL (ref 0.44–1.00)
GFR, Estimated: >60 mL/min (ref >60)
Glucose, Bld: 146 mg/dL — ABNORMAL HIGH (ref 70–99)
Potassium: 3.9 mmol/L (ref 3.5–5.1)
Sodium: 139 mmol/L (ref 135–145)

## 2021-11-22 LAB — GLUCOSE, CAPILLARY
Glucose-Capillary: 81 mg/dL (ref 70–99)
Glucose-Capillary: 139 mg/dL — ABNORMAL HIGH (ref 70–99)
Glucose-Capillary: 120 mg/dL — ABNORMAL HIGH (ref 70–99)
Glucose-Capillary: 122 mg/dL — ABNORMAL HIGH (ref 70–99)

## 2021-11-22 LAB — LEGIONELLA PNEUMOPHILA SEROGP 1 UR AG: L. pneumophila Serogp 1 Ur Ag: NEGATIVE

## 2021-11-22 MED ORDER — IPRATROPIUM-ALBUTEROL 0.5-2.5 (3) MG/3ML IN SOLN
3.0000 mL | Freq: Two times a day (BID) | RESPIRATORY_TRACT | Status: DC
  Administered 2021-11-23: 3 mL via RESPIRATORY_TRACT
  Filled 2021-11-22: qty 3

## 2021-11-22 NOTE — Care Management Important Message (Signed)
Important Message ? ?Patient Details  ?Name: Melissa Carpenter ?MRN: [id number] ?Date of Birth: [birth year] ? ? ?Medicare Important Message Given:  N/A - LOS <3 / Initial given by admissions ? ? ? ? ?[name] ?11/22/2021, 8:53 AM ?

## 2021-11-22 NOTE — Progress Notes (Signed)
Pt up to brush teeth without 02 and transported to 300 without 02 after asking if pt sob and if she thinks she can go without 02. Pt denies sob and stated she did not need it. Sats obtained after getting in 300 bed and 92-93%ra. Humidified 02 placed back on at 3Lnc. Nad. No resp distress and pt denied any sob while 02 was off.  ?

## 2021-11-22 NOTE — Plan of Care (Signed)

## 2021-11-22 NOTE — Progress Notes (Signed)
Patient level of care downgraded to Telemetry. Patient with new room assignment of AP- 300 - Room 303. Report called to Deirdre Pippins, RN who will  take over patient's care once on AP 300. Handoff completed.  ?Patient transported via wheelchair by SWOT RN to new room assignment.  ? ?

## 2021-11-22 NOTE — Progress Notes (Signed)
?PROGRESS NOTE ? ? ? ? ?Melissa Carpenter, is a 88 y.o. female, DOB - 02/27/1934, AOZ:[id number] ? ?Admit date - 11/19/2021   Admitting Physician No admitting provider for patient encounter. ? ?Outpatient Primary MD for the patient is [name], [name], MD ? ?LOS - 3 ? ?Chief Complaint  ?Patient presents with  ? Shortness of Breath  ?    ? ? ?Brief Narrative:  ?a 88 y.o. female with medical history significant of coronary artery disease, prediabetes, hypertension, mitral valve prolapse and mild aortic insufficiency admitted on 11/19/2021 with acute hypoxic respiratory failure requiring BiPAP and high flow oxygen in the setting of bilateral pneumonia ? ?  ?-Assessment and Plan: ? ?1)CAP--- bilateral/multifocal pneumonia, does not meet sepsis criteria ?-Strep pneumo antigen negative Legionella antigen pain ?-COVID and flu negative ? continue Rocephin/azithromycin, bronchodilators and mucolytics as ordered ?-Respiratory status and hypoxia continues to improve significantly ?-Repeat chest x-ray on 11/23/2021 ? ?2)Acute Hypoxic Respiratory Failure--secondary to #1 above  ?-- No longer requiring BiPAP,  ?Hypoxia improving oxygen has been weaned down to 3L--anticipate patient can come off O2 over the next 24 hours ?-Continue Solu-Medrol and bronchodilators as ordered ?- ?3)Abnormal chest imaging findings--- CT angio chest shows  7 mm right middle lobe pulmonary nodule/7 mm right solid pulmonary nodule and 1.8 cm short axis subcarinal lymph node, mildly enlarged. While this may be reactive, ?--follow-up CT chest in 3 months recommended to ensure stability ?-1.7 cm right thyroid nodule--- thyroid ultrasound recommended as outpatient ? ?4)HTN--- stable, continue amlodipine 10 mg daily,, Toprol-XL 100 mg daily,  hydralazine and Avapro ? ?5)Hyperglycemia--A1c from 09/14/2021 was 6.0 ?-Suspect stress and steroid-induced hyperglycemia ?Use Novolog/Humalog Sliding scale insulin with Accu-Cheks/Fingersticks as ordered  ? ?6)Acute Anemia----Hgb  down to 10.2 after hydration suspect some component of hemodilution no evidence of ongoing bleeding monitor closely ? ?7)Hypokalemia--- replaced and normalized ? ?8)HFpEF- CHF--suspect some degree of volume overload, ?-BNP 244, chest x-ray on 11/21/2021 consistent with CHF--- repeat chest x-ray on 11/23/2021 after additional diuresis ?-Echo with EF of greater than 75% with moderate aortic stenosis ?--IV Lasix for gentle diuresis given ? ?Disposition/Need for in-Hospital Stay- patient unable to be discharged at this time due to --acute hypoxic respiratory failure requiring BiPAP and high flow oxygen due to pneumonia quiring antibiotics and fluids-- ?-Anticipate possible discharge home on 11/23/2021 hopefully without oxygen ? ?Status is: Inpatient  ? ?Disposition: The patient is from: Home ?             Anticipated d/c is to: Home ?             Anticipated d/c date is: 2 days ?             Patient currently is not medically stable to d/c. ?Barriers: Not Clinically Stable-  ? ?Code Status :  -  Code Status: DNR  ? ?Family Communication:    NA (patient is alert, awake and coherent)  ? ?DVT Prophylaxis  :   - SCDs  enoxaparin (LOVENOX) injection 40 mg Start: 11/19/21 1730 ? ? ?Lab Results  ?Component Value Date  ? PLT 191 11/21/2021  ? ?Inpatient Medications ? ?Scheduled Meds: ? amLODipine  10 mg Oral Daily  ? aspirin  81 mg Oral Daily  ? atorvastatin  40 mg Oral Daily  ? azithromycin  500 mg Oral Q1400  ? enoxaparin (LOVENOX) injection  40 mg Subcutaneous Q24H  ? fluticasone  2 spray Each Nare Daily  ? furosemide  20 mg Intravenous BID  ? guaiFENesin  600 mg Oral BID  ? hydrALAZINE  25 mg Oral BID  ? insulin aspart  0-5 Units Subcutaneous QHS  ? insulin aspart  0-6 Units Subcutaneous TID WC  ? [START ON 11/23/2021] ipratropium-albuterol  3 mL Nebulization BID  ? irbesartan  300 mg Oral Daily  ? methylPREDNISolone (SOLU-MEDROL) injection  80 mg Intravenous Q24H  ? metoprolol succinate  100 mg Oral Daily  ? neomycin-polymyxin  b-dexamethasone  2 drop Right Eye Q4H  ? ?Continuous Infusions: ? sodium chloride Stopped (11/21/21 1513)  ? cefTRIAXone (ROCEPHIN)  IV 2 g (11/22/21 1440)  ? ?PRN Meds:.sodium chloride, acetaminophen **OR** acetaminophen, albuterol, LORazepam, ondansetron **OR** ondansetron (ZOFRAN) IV, phenol ? ? ?Anti-infectives (From admission, onward)  ? ? Start     Dose/Rate Route Frequency Ordered Stop  ? 11/20/21 1500  cefTRIAXone (ROCEPHIN) 2 g in sodium chloride 0.9 % 100 mL IVPB       ? 2 g ?200 mL/hr over 30 Minutes Intravenous Every 24 hours 11/19/21 1639 11/24/21 1459  ? 11/20/21 1500  azithromycin (ZITHROMAX) tablet 500 mg       ? 500 mg Oral Daily 11/19/21 1639 11/24/21 1359  ? 11/19/21 1530  cefTRIAXone (ROCEPHIN) 1 g in sodium chloride 0.9 % 100 mL IVPB       ? 1 g ?200 mL/hr over 30 Minutes Intravenous  Once 11/19/21 1520 11/19/21 1819  ? 11/19/21 1430  cefTRIAXone (ROCEPHIN) 1 g in sodium chloride 0.9 % 100 mL IVPB       ? 1 g ?200 mL/hr over 30 Minutes Intravenous  Once 11/19/21 1416 11/19/21 1556  ? 11/19/21 1430  azithromycin (ZITHROMAX) 500 mg in sodium chloride 0.9 % 250 mL IVPB       ? 500 mg ?250 mL/hr over 60 Minutes Intravenous  Once 11/19/21 1416 11/19/21 1620  ? ?  ? ?  ? ?Subjective: ?Melissa Carpenter today has no fevers, no emesis,  No chest pain,   ? ?-No significant dyspnea at rest ?-Dyspnea on exertion improving ?-Hypoxia improving and patient is weaning off oxygen nicely ?- ? ?Objective: ?Vitals:  ? 11/22/21 1014 11/22/21 1408 11/22/21 1752 11/22/21 1936  ?BP: (!) 136/53 (!) 117/45 (!) 128/53   ?Pulse: 75 71 74   ?Resp: '20 18 18   '$ ?Temp: 97.9 ?F (36.6 ?C) 98.3 ?F (36.8 ?C) 99.3 ?F (37.4 ?C)   ?TempSrc: Oral Oral Oral   ?SpO2: 94% 93% 97% 95%  ?Weight:      ?Height:      ? ? ?Intake/Output Summary (Last 24 hours) at 11/22/2021 2013 ?Last data filed at 11/22/2021 1700 ?Gross per 24 hour  ?Intake 700 ml  ?Output --  ?Net 700 ml  ? ?Filed Weights  ? 11/19/21 2150 11/20/21 0500 11/22/21 0500  ?Weight: 63.6 kg  63.6 kg 62.9 kg  ? ? ?Physical Exam ? ?Gen:- Awake Alert, dyspnea on exertion noted ?HEENT:- [place].AT, No sclera icterus ?Nose- [place] 3L/min ?Neck-Supple Neck,No JVD,.  ?Lungs-improving air movement, no wheezing  ?CV- S1, S2 normal, regular , 3/6 SM ?Abd-  +ve B.Sounds, Abd Soft, No tenderness,    ?Extremity/Skin:- No  edema, pedal pulses present  ?Psych-affect is appropriate, oriented x3 ?Neuro-generalized weakness, no new focal deficits, no tremors ? ?Data Reviewed: I have personally reviewed following labs and imaging studies ? ?CBC: ?Recent Labs  ?Lab 11/19/21 ?1311 11/20/21 ?0530 11/21/21 ?0429  ?WBC 8.4 8.4 10.9*  ?NEUTROABS 6.2  --   --   ?HGB 11.5* 10.2* 10.3*  ?HCT 34.7* 30.5*  31.8*  ?MCV 92.8 92.4 93.8  ?PLT 177 158 191  ? ?Basic Metabolic Panel: ?Recent Labs  ?Lab 11/19/21 ?1311 11/20/21 ?0530 11/21/21 ?0429 11/22/21 ?0423  ?NA 135 137 138 139  ?K 3.6 3.3* 4.7 3.9  ?CL 102 106 109 108  ?CO2 23 23 21* 22  ?GLUCOSE 119* 175* 163* 146*  ?BUN 14 14 26* 26*  ?CREATININE 0.48 0.46 0.61 0.56  ?CALCIUM 8.8* 8.5* 8.8* 8.9  ?PHOS  --   --  3.0  --   ? ?GFR: ?Estimated Creatinine Clearance: 43.7 mL/min (by C-G formula based on SCr of 0.56 mg/dL). ?Liver Function Tests: ?Recent Labs  ?Lab 11/21/21 ?0429  ?ALBUMIN 2.9*  ? ?Cardiac Enzymes: ?No results for input(s): CKTOTAL, CKMB, CKMBINDEX, TROPONINI in the last 168 hours. ?BNP (last 3 results) ?No results for input(s): PROBNP in the last 8760 hours. ?HbA1C: ?No results for input(s): HGBA1C in the last 72 hours. ?Sepsis Labs: ?'@LABRCNTIP'$ (procalcitonin:4,lacticidven:4) ?) ?Recent Results (from the past 240 hour(s))  ?Novel Coronavirus, NAA ([facility])     Status: None  ? Collection Time: 11/17/21  3:37 PM  ? Specimen: Nasopharyngeal(NP) swabs in vial transport medium  ?Result Value Ref Range Status  ? SARS-CoV-2, NAA Not Detected Not Detected Final  ?  Comment: This nucleic acid amplification test was developed and its performance ?characteristics determined by [employer]. Nucleic acid ?amplification tests include RT-PCR and TMA. This test has not been ?FDA cleared or approved. This test has been authorized by FDA under ?an Emergency Use Authorization (EUA). Terie Purser

## 2021-11-23 ENCOUNTER — Inpatient Hospital Stay (HOSPITAL_COMMUNITY): Payer: Medicare Other

## 2021-11-23 DIAGNOSIS — R0902 Hypoxemia: Secondary | ICD-10-CM

## 2021-11-23 DIAGNOSIS — J9601 Acute respiratory failure with hypoxia: Secondary | ICD-10-CM | POA: Diagnosis not present

## 2021-11-23 DIAGNOSIS — K449 Diaphragmatic hernia without obstruction or gangrene: Secondary | ICD-10-CM

## 2021-11-23 DIAGNOSIS — J189 Pneumonia, unspecified organism: Secondary | ICD-10-CM | POA: Diagnosis not present

## 2021-11-23 DIAGNOSIS — K219 Gastro-esophageal reflux disease without esophagitis: Secondary | ICD-10-CM

## 2021-11-23 DIAGNOSIS — I10 Essential (primary) hypertension: Secondary | ICD-10-CM | POA: Diagnosis not present

## 2021-11-23 DIAGNOSIS — I5033 Acute on chronic diastolic (congestive) heart failure: Secondary | ICD-10-CM | POA: Diagnosis present

## 2021-11-23 LAB — CBC
HCT: 30.7 % — ABNORMAL LOW (ref 36.0–46.0)
Hemoglobin: 10.2 g/dL — ABNORMAL LOW (ref 12.0–15.0)
MCH: 30.4 pg (ref 26.0–34.0)
MCHC: 33.2 g/dL (ref 30.0–36.0)
MCV: 91.4 fL (ref 80.0–100.0)
Platelets: 223 10*3/uL (ref 150–400)
RBC: 3.36 MIL/uL — ABNORMAL LOW (ref 3.87–5.11)
RDW: 13.2 % (ref 11.5–15.5)
WBC: 8.4 10*3/uL (ref 4.0–10.5)
nRBC: 0 % (ref 0.0–0.2)

## 2021-11-23 LAB — RENAL FUNCTION PANEL
Albumin: 2.9 g/dL — ABNORMAL LOW (ref 3.5–5.0)
Anion gap: 10 (ref 5–15)
BUN: 19 mg/dL (ref 8–23)
CO2: 23 mmol/L (ref 22–32)
Calcium: 8.9 mg/dL (ref 8.9–10.3)
Chloride: 105 mmol/L (ref 98–111)
Creatinine, Ser: 0.49 mg/dL (ref 0.44–1.00)
GFR, Estimated: >60 mL/min (ref >60)
Glucose, Bld: 167 mg/dL — ABNORMAL HIGH (ref 70–99)
Phosphorus: 4.3 mg/dL (ref 2.5–4.6)
Potassium: 4 mmol/L (ref 3.5–5.1)
Sodium: 138 mmol/L (ref 135–145)

## 2021-11-23 LAB — CULTURE, RESPIRATORY W GRAM STAIN: Culture: NORMAL

## 2021-11-23 LAB — GLUCOSE, CAPILLARY
Glucose-Capillary: 162 mg/dL — ABNORMAL HIGH (ref 70–99)
Glucose-Capillary: 140 mg/dL — ABNORMAL HIGH (ref 70–99)

## 2021-11-23 IMAGING — DX DG CHEST 2V
2 series · 2 of 2 positions shown · non-contrast
Comparison: [DATE]

CLINICAL DATA: 87-year-old female with shortness of breath and
cough.

EXAM:
CHEST - 2 VIEW

[chest pa]
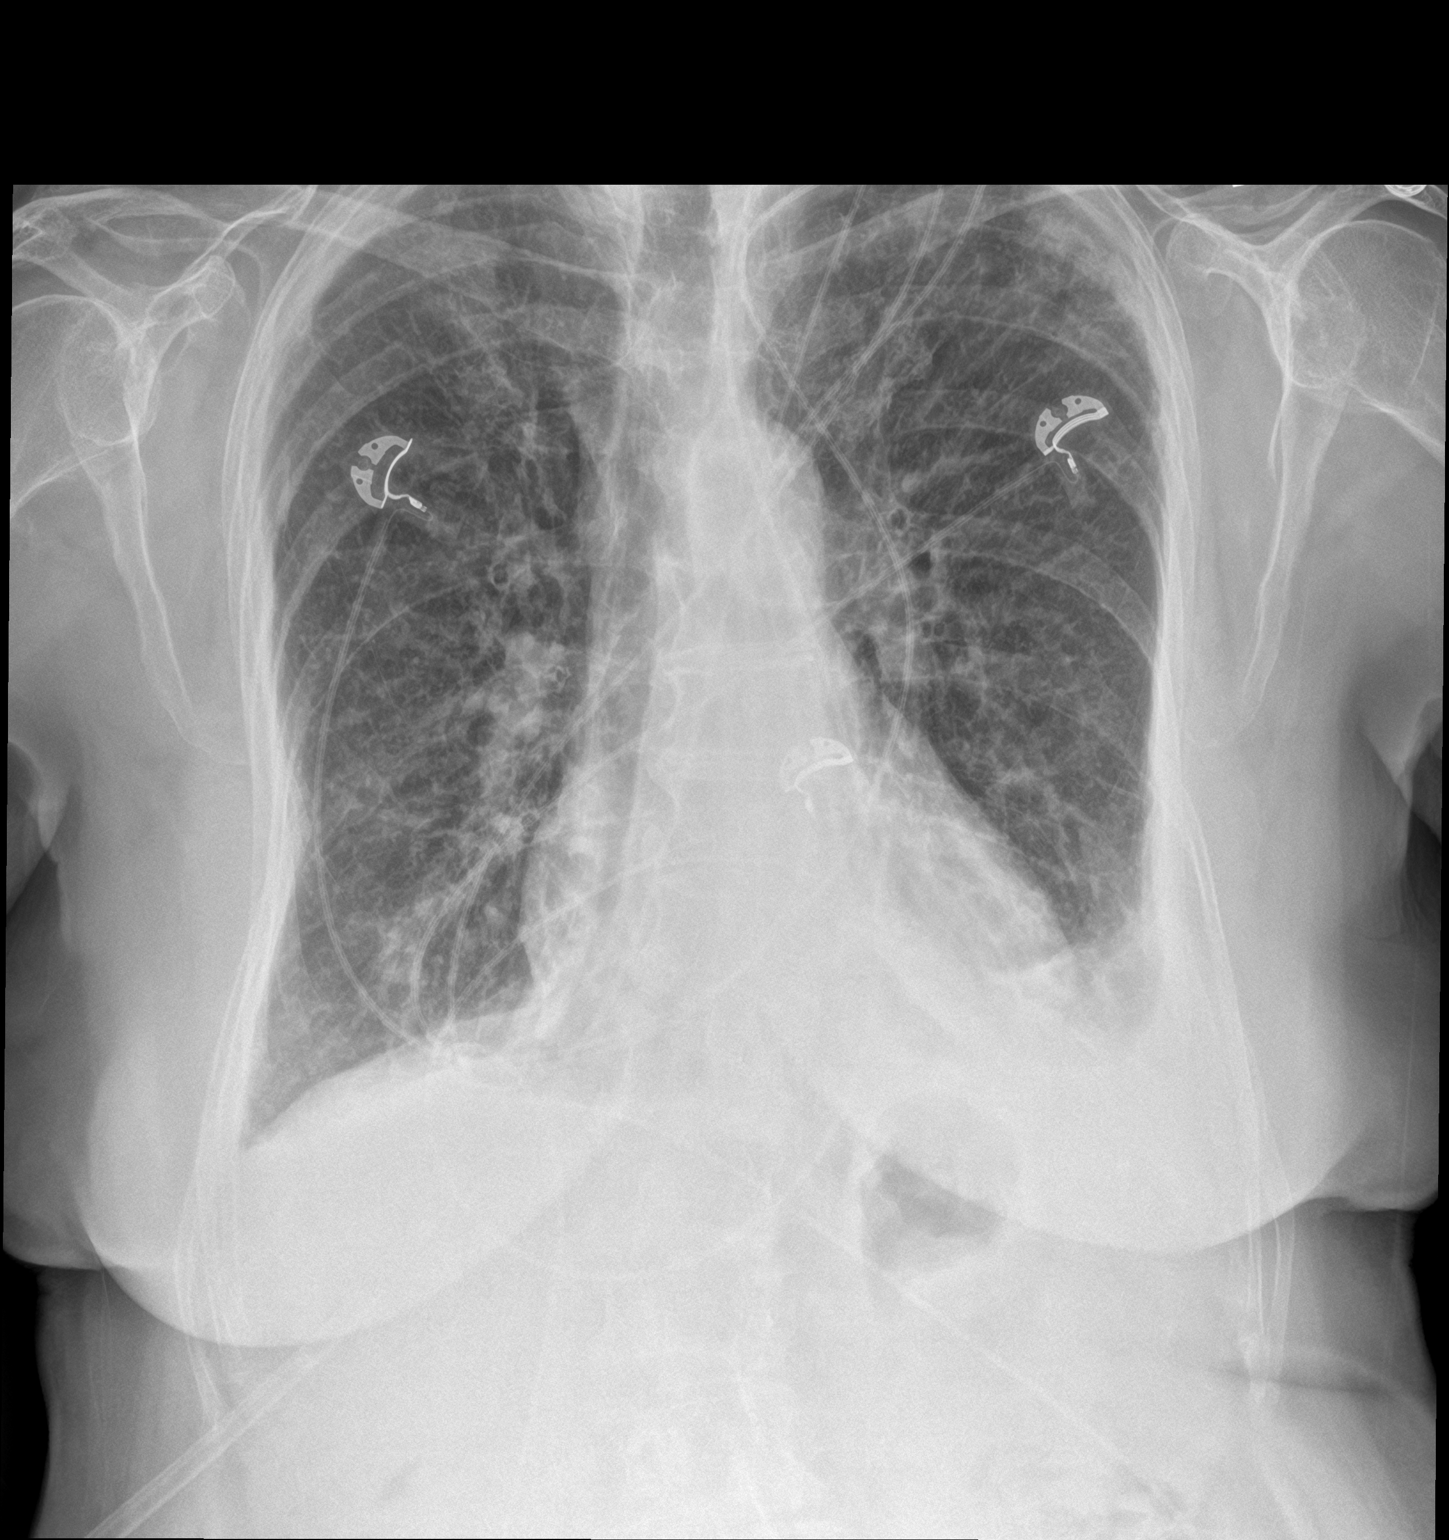

[chest lat]
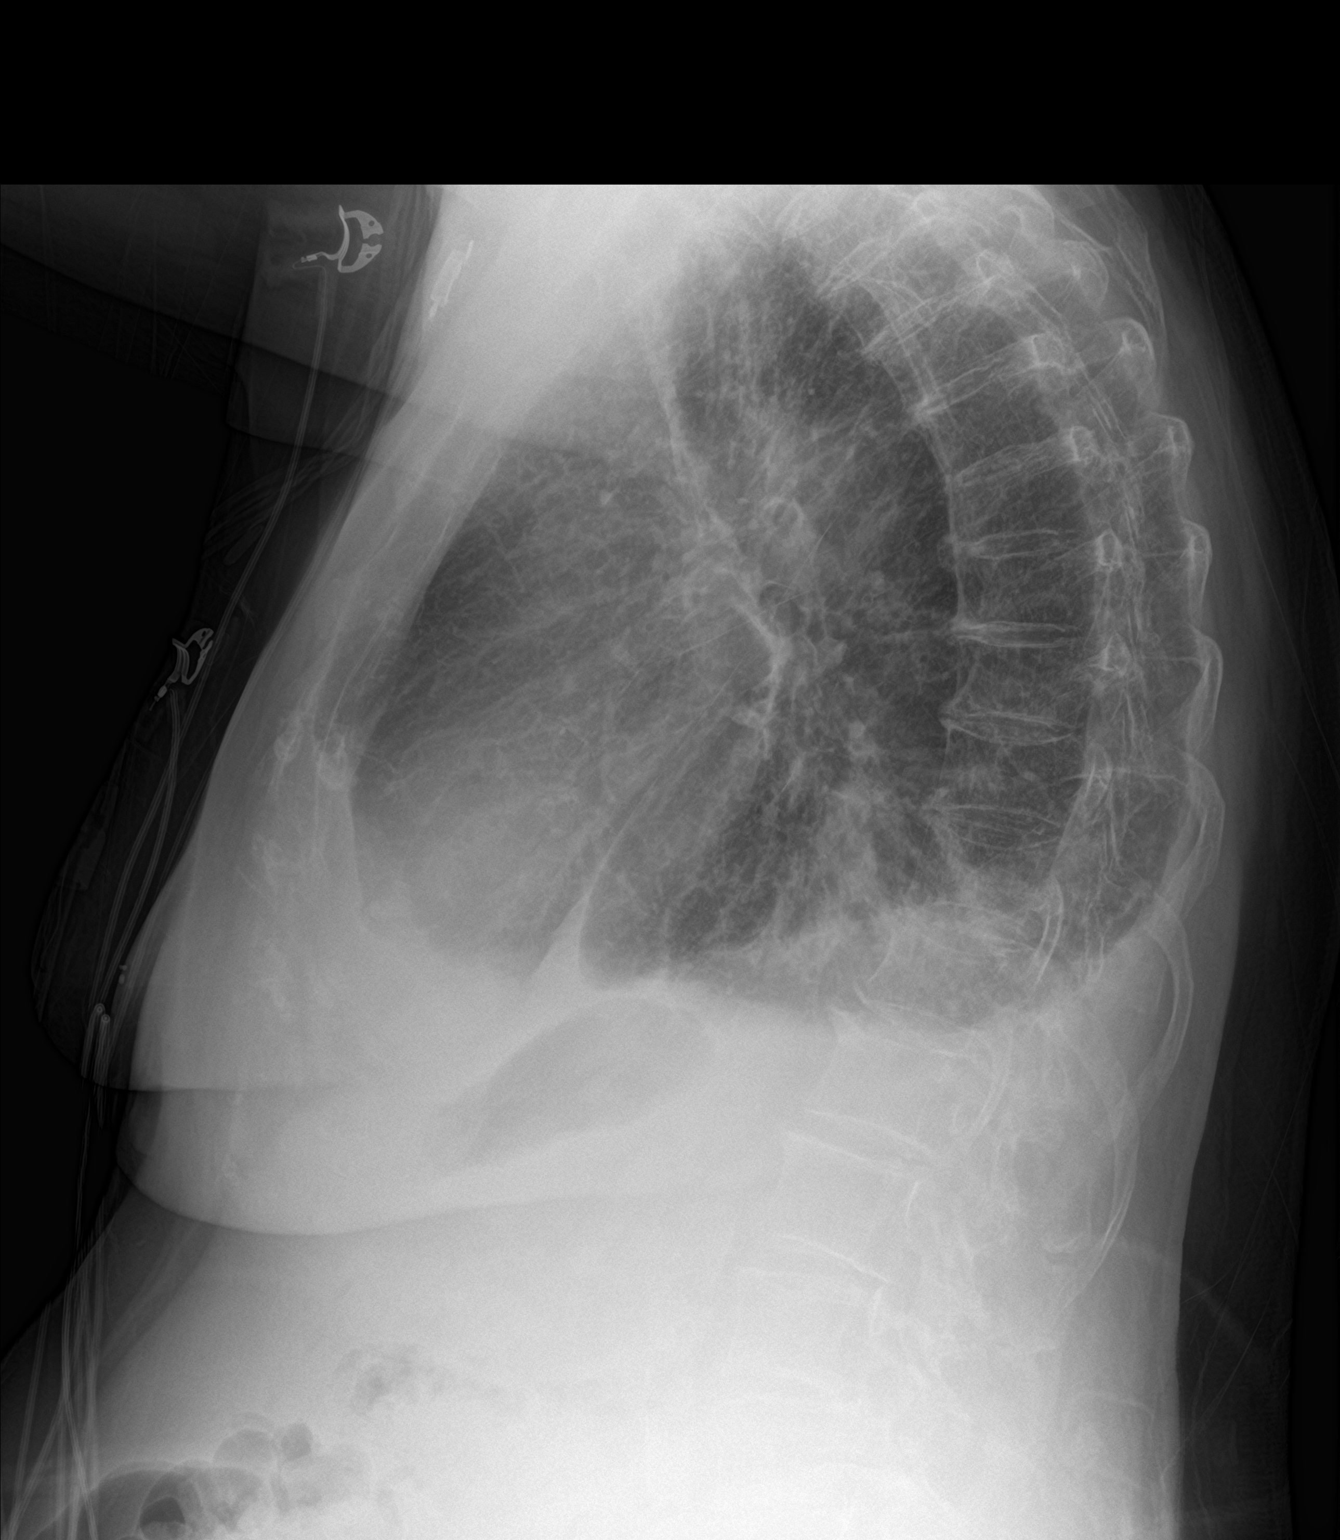

[2 of 2 positions shown; findings below may reference images not displayed]

FINDINGS: The mediastinal contours are within normal limits. No cardiomegaly.
Slight interval improved aeration of the lungs bilaterally.
Persistent blunting of left costophrenic angle, similar to
comparison. No acute osseous abnormality.
IMPRESSION: Improving mild pulmonary edema. Persistent small left pleural
effusion.

## 2021-11-23 MED ORDER — CEFDINIR 300 MG PO CAPS: 300.0000 mg | ORAL_CAPSULE | Freq: Two times a day (BID) | ORAL | 0 refills | Status: AC

## 2021-11-23 MED ORDER — PREDNISONE 20 MG PO TABS: ORAL_TABLET | ORAL | 0 refills | Status: DC

## 2021-11-23 MED ORDER — FUROSEMIDE 20 MG PO TABS: 20.0000 mg | ORAL_TABLET | Freq: Every day | ORAL | 2 refills | Status: DC

## 2021-11-23 MED ORDER — GUAIFENESIN ER 600 MG PO TB12
600.0000 mg | ORAL_TABLET | Freq: Two times a day (BID) | ORAL | 0 refills | Status: AC
Start: 2021-11-23 — End: 2021-12-03

## 2021-11-23 NOTE — TOC Transition Note (Signed)
Transition of Care (TOC) - CM/SW Discharge Note ? ? ?Patient Details  ?Name: Melissa Carpenter ?MRN: [id number] ?Date of Birth: Feb 20, 1934 ? ?Transition of Care (TOC) CM/SW Contact:  ?Salome Arnt, LCSW ?Phone Number: ?11/23/2021, 3:11 PM ? ? ?Clinical Narrative:  Per MD, d/c later today with no TOC needs.    ? ? ? ?Final next level of care: Home/Self Care ?Barriers to Discharge: Barriers Resolved ? ? ?Patient Goals and CMS Choice ?  ?  ?  ? ?Discharge Placement ?  ?           ?  ?  ?  ?  ? ?Discharge Plan and Services ?  ?  ?           ?  ?  ?  ?  ?  ?  ?  ?  ?  ?  ? ?Social Determinants of Health (SDOH) Interventions ?  ? ? ?Readmission Risk Interventions ?   ? View : No data to display.  ?  ?  ?  ? ? ? ? ? ?

## 2021-11-23 NOTE — Assessment & Plan Note (Signed)
-  Continue the use of aspirin and Lipitor ?

## 2021-11-23 NOTE — Assessment & Plan Note (Signed)
-   In the setting of multifocal pneumonia, acute on chronic diastolic heart failure and COPD exacerbation. ?-Patient was tachypneic and requiring the use of BiPAP at time of admission. ?-At discharge no oxygen requirement appreciated. ?-Patient will complete oral antibiotic therapy, continue low-sodium diet and daily low-dose Lasix. ?-Steroids tapering with instructions to resume home inhalers/bronchodilator management provided. ?

## 2021-11-23 NOTE — Assessment & Plan Note (Signed)
Continue PPI ?

## 2021-11-23 NOTE — Assessment & Plan Note (Signed)
-  Continue to follow daily weights and low-sodium diet ?Condition is stabilized after the use of IV diuresis ?-No oxygen requirement needed at discharge ?-Low dose Lasix on daily basis recommended. ?-Continue to follow patient's volume status with further adjustment of diuretics ?

## 2021-11-23 NOTE — Plan of Care (Signed)

## 2021-11-23 NOTE — Assessment & Plan Note (Signed)
-  Patient advised to maintain adequate hydration especially while receiving treatment with steroids. ?-Continue close outpatient follow-up outpatient CBGs with initiation of hypoglycemic regimen as required. ?

## 2021-11-23 NOTE — Assessment & Plan Note (Signed)
-  Continue outpatient follow-up with cardiology service. ?

## 2021-11-23 NOTE — Assessment & Plan Note (Signed)
-  Continue heart healthy diet ?-Resume home antihypertensive agent ?-Reassess blood pressure and further adjust medication as needed. ?-Low-sodium diet/lifestyle modifications encouraged. ?

## 2021-11-23 NOTE — Assessment & Plan Note (Signed)
-   Complete treatment with oral antibiotics as mentioned. ?-Patient instructed to continue the use of incentive spirometer and flutter valve ?-Continue the use of mucolytic's/antitussive medications. ?-Repeat chest x-ray in 8 weeks to assure resolution of infiltrates. ?

## 2021-11-23 NOTE — Discharge Summary (Signed)
?Physician Discharge Summary ?  ?Patient: Melissa Carpenter MRN: [id number] DOB: 1933/09/11  ?Admit date:     11/19/2021  ?Discharge date: 11/23/21  ?Discharge Physician: Melissa Carpenter  ? ?PCP: [name], Melissa Kaufmann, MD  ? ?Recommendations at discharge:  ?Repeat chest x-ray in 8 weeks to assure resolution of infiltrates. ?Repeat basic metabolic panel to follow ultralights renal function ?Repeat CBC to follow hemoglobin and stability and complete resolution of patient's WBCs elevation. ?Reassess blood pressure and adjust antihypertensive treatment as needed ?Continue to closely follow patient's CBGs with decision to initiate treatment on diabetes medication once appropriate. ? ?Discharge Diagnoses: ?Principal Problem: ?  Acute respiratory failure with hypoxia ([place]) ?Active Problems: ?  Carotid artery disease ([place]) ?  Essential hypertension ?  Gastroesophageal reflux disease with hiatal hernia ?  Pre-diabetes ?  Mild aortic stenosis ?  CAP (community acquired pneumonia) ?  Hypoxia ? ? ?Brief Hospital admission narrative course: ?As per H&P written by Dr. Nehemiah Carpenter on 11/19/2021 this ?Melissa Carpenter is a 88 y.o. female with medical history significant of coronary artery disease, prediabetes, hypertension, mitral valve prolapse and mild aortic insufficiency.  Patient seen for productive cough with green sputum that started on Thursday.  She saw her primary care physician who gave her a Z-Pak.  Last night, the patient began to have increasing shortness of breath, which worsened this morning.  She was unable to move around much.  She came to the hospital for evaluation.  Dyspnea worse with exertion and improved with rest.  Denies fevers, chills.  Has decreased appetite. ?  ?Additionally, she has drainage from the right eye which is painful and red.  This has been going on for a couple of days. ? ?Assessment and Plan: ?* Acute respiratory failure with hypoxia ([place]) ?- In the setting of multifocal pneumonia, acute on chronic diastolic heart  failure and COPD exacerbation. ?-Patient was tachypneic and requiring the use of BiPAP at time of admission. ?-At discharge no oxygen requirement appreciated. ?-Patient will complete oral antibiotic therapy, continue low-sodium diet and daily low-dose Lasix. ?-Steroids tapering with instructions to resume home inhalers/bronchodilator management provided. ? ?Acute on chronic diastolic CHF (congestive heart failure) ([place]) ?-Continue to follow daily weights and low-sodium diet ?Condition is stabilized after the use of IV diuresis ?-No oxygen requirement needed at discharge ?-Low dose Lasix on daily basis recommended. ?-Continue to follow patient's volume status with further adjustment of diuretics ? ?CAP (community acquired pneumonia) ?- Complete treatment with oral antibiotics as mentioned. ?-Patient instructed to continue the use of incentive spirometer and flutter valve ?-Continue the use of mucolytic's/antitussive medications. ?-Repeat chest x-ray in 8 weeks to assure resolution of infiltrates. ? ?Mild aortic stenosis ?-Continue outpatient follow-up with cardiology service. ? ?Pre-diabetes ?-Patient advised to maintain adequate hydration especially while receiving treatment with steroids. ?-Continue close outpatient follow-up outpatient CBGs with initiation of hypoglycemic regimen as required. ? ?Gastroesophageal reflux disease with hiatal hernia ?-Continue PPI. ? ?Essential hypertension ?-Continue heart healthy diet ?-Resume home antihypertensive agent ?-Reassess blood pressure and further adjust medication as needed. ?-Low-sodium diet/lifestyle modifications encouraged. ? ?Carotid artery disease ([place]) ?-Continue the use of aspirin and Lipitor ? ? ?Consultants: None ?Procedures performed: See below for x-ray reports.  2D echo demonstrating preserved ejection fraction, mild to moderate aortic stenosis and impaired relaxation (consistent with diastolic heart failure). ?Disposition: Discharged home with instructions  to follow-up with PCP in 10 days. ?Diet recommendation: Heart healthy/low-sodium diet. ? ?DISCHARGE MEDICATION: ?Allergies as of 11/23/2021   ? ?   Reactions  ?  Codeine Hives  ? Forteo [parathyroid Hormone (recomb)] Other (See Comments)  ? Weakness and calcium increase  ? Raloxifene Other (See Comments)  ? Eye problems ?Other reaction(s): Other (See Comments) ?Eye problems ?Eye problems  ? Morphine Rash  ? Penicillins Rash  ? Sulfites Rash  ? Sulfonamide Derivatives Rash  ? ?  ? ?  ?Medication List  ?  ? ?STOP taking these medications   ? ?azithromycin 250 MG tablet ?Commonly known as: ZITHROMAX ?  ?estradiol 0.1 MG/GM vaginal cream ?Commonly known as: ESTRACE ?  ? ?  ? ?TAKE these medications   ? ?Accu-Chek Softclix Lancets lancets ?Use to check blood sugars daily ?  ?albuterol 108 (90 Base) MCG/ACT inhaler ?Commonly known as: VENTOLIN HFA ?Inhale 2 puffs into the lungs every 6 (six) hours as needed. ?  ?amLODipine-atorvastatin 10-40 MG tablet ?Commonly known as: CADUET ?Take 1 tablet by mouth daily. ?  ?aspirin 81 MG tablet ?Take 81 mg by mouth daily. ?  ?Calcium Carbonate-Vitamin D 600-400 MG-UNIT tablet ?Commonly known as: Caltrate 600+D ?Take 1 tablet by mouth daily. ?  ?cefdinir 300 MG capsule ?Commonly known as: OMNICEF ?Take 1 capsule (300 mg total) by mouth 2 (two) times daily for 4 days. ?  ?cholecalciferol 10 MCG (400 UNIT) Tabs tablet ?Commonly known as: VITAMIN D3 ?Take 1,000 Units by mouth. ?  ?fish oil-omega-3 fatty acids 1000 MG capsule ?Take 1 g by mouth daily. ?  ?fluticasone 50 MCG/ACT nasal spray ?Commonly known as: FLONASE ?SPRAY 2 SPRAYS IN EACH NOSTRIL EVERY DAY ?What changed: See the new instructions. ?  ?furosemide 20 MG tablet ?Commonly known as: Lasix ?Take 1 tablet (20 mg total) by mouth daily. ?  ?glucose blood test strip ?Check blood glucose QD and PRN DxE11.9 ?  ?guaiFENesin 600 MG 12 hr tablet ?Commonly known as: [place] ?Take 1 tablet (600 mg total) by mouth 2 (two) times daily for 10  days. ?  ?hydrALAZINE 25 MG tablet ?Commonly known as: APRESOLINE ?Take 1 tablet (25 mg total) by mouth in the morning and at bedtime. ?  ?metoprolol succinate 100 MG 24 hr tablet ?Commonly known as: TOPROL-XL ?Take 1 tablet (100 mg total) by mouth daily. TAKE WITH OR IMMEDIATELY FOLLOWING A MEAL. ?  ?multivitamin tablet ?Take 1 tablet by mouth daily. ?  ?predniSONE 20 MG tablet ?Commonly known as: DELTASONE ?Take 3 tablets by mouth daily X 1 day; then 2 tablets by mouth daily X 2 days; then 1 tablet by mouth daily x3 days; then half tablet by mouth daily x3 days and stop prednisone. ?  ?telmisartan 80 MG tablet ?Commonly known as: MICARDIS ?TAKE 1 TABLET BY MOUTH EVERY DAY IN THE EVENING ?  ?triamcinolone cream 0.1 % ?Commonly known as: KENALOG ?Apply 1 application topically 2 (two) times daily. ?  ? ?  ? ? Follow-up Information   ? ? [name], Melissa Kaufmann, MD. Schedule an appointment as soon as possible for a visit in 10 day(s).   ?Specialties: Family Medicine, Cardiology ?Contact information: ?[address] ?[place] [address] ?[phone] ? ? ?  ?  ? ?  ?  ? ?  ? ?Discharge Exam: ?Filed Weights  ? 11/19/21 2150 11/20/21 0500 11/22/21 0500  ?Weight: 63.6 kg 63.6 kg 62.9 kg  ? ?General exam: Alert, awake, oriented x 3; demonstrating good oxygen saturation on room air.  No chest pain, no nausea, no vomiting.  Patient denies orthopnea. ?Respiratory system: Positive scattered rhonchi; no wheezing, no crackles, improved air movement bilaterally.  No requiring oxygen supplementation. ?Cardiovascular system:RRR.  Positive systolic murmur; no rubs, no gallops, no JVD on exam.  Positive trace edema bilaterally appreciated.  ?Gastrointestinal system: Abdomen is nondistended, soft and nontender. No organomegaly or masses felt. Normal bowel sounds heard. ?Central nervous system: Alert and oriented. No focal neurological deficits. ?Extremities: No cyanosis or clubbing. ?Skin: No rashes, no petechiae. ?Psychiatry: Judgement  and insight appear normal. Mood & affect appropriate.  ? ? ?The results of significant diagnostics from this hospitalization (including imaging, microbiology, ancillary and laboratory) are listed below f

## 2021-11-24 ENCOUNTER — Telehealth: Payer: Self-pay | Admitting: Family Medicine

## 2021-11-24 ENCOUNTER — Telehealth: Payer: Self-pay

## 2021-11-24 NOTE — Telephone Encounter (Signed)
Appointment given for 11/30/21 with Monia Pouch, [place].  ?

## 2021-11-24 NOTE — Telephone Encounter (Signed)
Transition Care Management Follow-up Telephone Call ?Date of discharge and from where: [facility] 11-23-21 Dx: acute respiratory failure with hypoxia ?How have you been since you were released from the hospital? Doing good  ?Any questions or concerns? No ? ?Items Reviewed: ?Did the pt receive and understand the discharge instructions provided? Yes  ?Medications obtained and verified? Yes  ?Other? No  ?Any new allergies since your discharge? No  ?Dietary orders reviewed? Yes ?Do you have support at home? Yes  ? ?Home Care and Equipment/Supplies: ?Were home health services ordered? no ?If so, what is the name of the agency? na  ?Has the agency set up a time to come to the patient's home? not applicable ?Were any new equipment or medical supplies ordered?  No ?What is the name of the medical supply agency? na ?Were you able to get the supplies/equipment? not applicable ?Do you have any questions related to the use of the equipment or supplies? No ? ?Functional Questionnaire: (I = Independent and D = Dependent) ?ADLs: I ? ?Bathing/Dressing- I ? ?Meal Prep- I ? ?Eating- I ? ?Maintaining continence- I ? ?Transferring/Ambulation- I ? ?Managing Meds- I ? ?Follow up appointments reviewed: ? ?PCP Hospital f/u appt confirmed? NO- pt only wants to see Dr Felipa Furnace- no available appts by 12-07-21- will request front desk contact pt to make appt  ?[facility] f/u appt confirmed? No   ?Are transportation arrangements needed? No  ?If their condition worsens, is the pt aware to call PCP or go to the Emergency Dept.? Yes ?Was the patient provided with contact information for the PCP's office or ED? Yes ?Was to pt encouraged to call back with questions or concerns? Yes  ?

## 2021-11-24 NOTE — Telephone Encounter (Signed)
Please call pt to schedule hospital follow up.  ?

## 2021-11-30 ENCOUNTER — Ambulatory Visit (INDEPENDENT_AMBULATORY_CARE_PROVIDER_SITE_OTHER): Payer: Medicare Other | Admitting: Family Medicine

## 2021-11-30 ENCOUNTER — Encounter: Payer: Self-pay | Admitting: Family Medicine

## 2021-11-30 VITALS — BP 143/61 | HR 63 | Temp 98.4°F | Ht 61.0 in | Wt 132.0 lb

## 2021-11-30 DIAGNOSIS — R059 Cough, unspecified: Secondary | ICD-10-CM | POA: Diagnosis not present

## 2021-11-30 DIAGNOSIS — R0902 Hypoxemia: Secondary | ICD-10-CM

## 2021-11-30 DIAGNOSIS — J189 Pneumonia, unspecified organism: Secondary | ICD-10-CM | POA: Diagnosis not present

## 2021-11-30 DIAGNOSIS — Z09 Encounter for follow-up examination after completed treatment for conditions other than malignant neoplasm: Secondary | ICD-10-CM

## 2021-11-30 DIAGNOSIS — J9601 Acute respiratory failure with hypoxia: Secondary | ICD-10-CM

## 2021-11-30 MED ORDER — BENZONATATE 100 MG PO CAPS: 100.0000 mg | ORAL_CAPSULE | Freq: Three times a day (TID) | ORAL | 0 refills | Status: DC | PRN

## 2021-11-30 NOTE — Progress Notes (Signed)
?  ? ?Subjective:  ?Patient ID: Melissa Carpenter, female    DOB: [birth year], 88 y.o.   MRN: [id number] ? ?Patient Care Team: ?[name], [name], MD as PCP - General (Family Medicine) ?Paralee Cancel, MD as Consulting Physician (Orthopedic Surgery) ?Minus Breeding, MD as Consulting Physician (Cardiology) ?Angelia Mould, MD as Consulting Physician (Vascular Surgery)  ? ?Chief Complaint:  Transitions Of Care (Respiratory illness) ? ? ?HPI: ?Melissa Carpenter is a 88 y.o. female presenting on 11/30/2021 for Transitions Of Care (Respiratory illness) ? ?Pt presents today for follow up after recent hospital admission for acute respiratory failure with hypoxia, CAP, hypoxia, and cough. She states she has been doing well since discharge. She denies fever, fatigue, confusion, chest pain, shortness or breath, or syncope. She does have some general weakness but reports this is improving. Discharge labs revealed glucose of 167, Hgb 10.2, Hct 30.7, albumin 2.9. She has completed all antibiotic therapy and states she is doing much better. She does have an ongoing cough which is interrupting her sleep. Nonproductive.  ? ?Today's visit was for Transitional Care Management. ?The patient was discharged from [facility] on 11/23/2021 with a primary diagnosis of acute respiratory failure with hypoxia.  ?Contact with the patient and/or caregiver, by a clinical staff member, was made on 11/24/2021 and was documented as a telephone encounter within the EMR. ?Through chart review and discussion with the patient I have determined that management of their condition is of high complexity.  ? ? ?  ?  ? ? ?Relevant past medical, surgical, family, and social history reviewed and updated as indicated.  ?Allergies and medications reviewed and updated. Data reviewed: Chart in Epic. ? ? ?Past Medical History:  ?Diagnosis Date  ? Allergy   ? Aortic insufficiency   ? Mild, echo, December, 2009  ? Arthritis   ? Carotid artery disease ([place])   ? Cataract   ?  Cellulitis   ? Chest pain   ? Catheterization, 2003, no significant CAD  ? Diastolic dysfunction   ? Mild diastolic dysfunction, echo, December, 2012  ? DJD (degenerative joint disease)   ? Dyslipidemia   ? Edema   ? November, 2012  ? GERD (gastroesophageal reflux disease)   ? Hyperlipidemia   ? Hypertension   ? Mitral valve prolapse   ? Echo, 2009, very mild intermittent prolapse of the posterior leaflet, no MR  ? Osteoporosis   ? Prediabetes   ? Tachycardia   ? Nighttime tachycardia, February, 2014  ? Thyroid nodule   ? ? ?Past Surgical History:  ?Procedure Laterality Date  ? ABDOMINAL HYSTERECTOMY  1964  ? APPENDECTOMY  1946  ? BIOPSY THYROID Left   ? CARDIAC CATHETERIZATION  sept 2003  ? no significant cad  ? CATARACT EXTRACTION, BILATERAL    ? COLONOSCOPY    ? NASAL SINUS SURGERY    ? TONSILLECTOMY    ? ? ?Social History  ? ?Socioeconomic History  ? Marital status: Married  ?  Spouse name: Thersa Salt [facility])  ? Number of children: 4  ? Years of education: Not on file  ? Highest education level: Not on file  ?Occupational History  ? Occupation: retired  ?  Employer: [employer]  ?Tobacco Use  ? Smoking status: Never  ? Smokeless tobacco: Never  ?Vaping Use  ? Vaping Use: Never used  ?Substance and Sexual Activity  ? Alcohol use: No  ?  Alcohol/week: 0.0 standard drinks  ? Drug use: No  ? Sexual activity: Never  ?  Other Topics Concern  ? Not on file  ?Social History Narrative  ? Lives in 2 story home with her husband  ? Children live nearby  ? ?Social Determinants of Health  ? ?Financial Resource Strain: Low Risk   ? Difficulty of Paying Living Expenses: Not very hard  ?Food Insecurity: No Food Insecurity  ? Worried About Charity fundraiser in the Last Year: Never true  ? Ran Out of Food in the Last Year: Never true  ?Transportation Needs: No Transportation Needs  ? Lack of Transportation (Medical): No  ? Lack of Transportation (Non-Medical): No  ?Physical Activity: Insufficiently Active  ? Days of Exercise per  Week: 7 days  ? Minutes of Exercise per Session: 10 min  ?Stress: No Stress Concern Present  ? Feeling of Stress : Not at all  ?Social Connections: Moderately Isolated  ? Frequency of Communication with Friends and Family: More than three times a week  ? Frequency of Social Gatherings with Friends and Family: More than three times a week  ? Attends Religious Services: Never  ? Active Member of Clubs or Organizations: No  ? Attends Archivist Meetings: Never  ? Marital Status: Married  ?Intimate Partner Violence: Not At Risk  ? Fear of Current or Ex-Partner: No  ? Emotionally Abused: No  ? Physically Abused: No  ? Sexually Abused: No  ? ? ?Outpatient Encounter Medications as of 11/30/2021  ?Medication Sig  ? Accu-Chek Softclix Lancets lancets Use to check blood sugars daily  ? albuterol (VENTOLIN HFA) 108 (90 Base) MCG/ACT inhaler Inhale 2 puffs into the lungs every 6 (six) hours as needed.  ? amLODipine-atorvastatin (CADUET) 10-40 MG tablet Take 1 tablet by mouth daily.  ? aspirin 81 MG tablet Take 81 mg by mouth daily.  ? benzonatate (TESSALON PERLES) 100 MG capsule Take 1 capsule (100 mg total) by mouth 3 (three) times daily as needed for cough.  ? Calcium Carbonate-Vitamin D (CALTRATE 600+D) 600-400 MG-UNIT per tablet Take 1 tablet by mouth daily.  ? cholecalciferol (VITAMIN D) 400 UNITS TABS Take 1,000 Units by mouth.  ? fish oil-omega-3 fatty acids 1000 MG capsule Take 1 g by mouth daily.  ? fluticasone (FLONASE) 50 MCG/ACT nasal spray SPRAY 2 SPRAYS IN EACH NOSTRIL EVERY DAY (Patient taking differently: Place 2 sprays into both nostrils daily.)  ? furosemide (LASIX) 20 MG tablet Take 1 tablet (20 mg total) by mouth daily.  ? glucose blood test strip Check blood glucose QD and PRN DxE11.9  ? guaiFENesin (MUCINEX) 600 MG 12 hr tablet Take 1 tablet (600 mg total) by mouth 2 (two) times daily for 10 days.  ? hydrALAZINE (APRESOLINE) 25 MG tablet Take 1 tablet (25 mg total) by mouth in the morning and at  bedtime.  ? metoprolol succinate (TOPROL-XL) 100 MG 24 hr tablet Take 1 tablet (100 mg total) by mouth daily. TAKE WITH OR IMMEDIATELY FOLLOWING A MEAL.  ? Multiple Vitamin (MULTIVITAMIN) tablet Take 1 tablet by mouth daily.  ? telmisartan (MICARDIS) 80 MG tablet TAKE 1 TABLET BY MOUTH EVERY DAY IN THE EVENING  ? triamcinolone cream (KENALOG) 0.1 % Apply 1 application topically 2 (two) times daily.  ? [DISCONTINUED] predniSONE (DELTASONE) 20 MG tablet Take 3 tablets by mouth daily X 1 day; then 2 tablets by mouth daily X 2 days; then 1 tablet by mouth daily x3 days; then half tablet by mouth daily x3 days and stop prednisone.  ? ?No facility-administered encounter medications on file  as of 11/30/2021.  ? ? ?Allergies  ?Allergen Reactions  ? Codeine Hives  ? Forteo [Parathyroid Hormone (Recomb)] Other (See Comments)  ?  Weakness and calcium increase  ? Raloxifene Other (See Comments)  ?  Eye problems ?Other reaction(s): Other (See Comments) ?Eye problems ?Eye problems  ? Morphine Rash  ? Penicillins Rash  ? Sulfites Rash  ? Sulfonamide Derivatives Rash  ? ? ?Review of Systems  ?Constitutional:  Positive for activity change (improving). Negative for appetite change, chills, diaphoresis, fatigue, fever and unexpected weight change.  ?HENT: Negative.    ?Eyes: Negative.   ?Respiratory:  Positive for cough. Negative for chest tightness and shortness of breath.   ?Cardiovascular:  Negative for chest pain, palpitations and leg swelling.  ?Gastrointestinal:  Negative for abdominal pain, blood in stool, constipation, diarrhea, nausea and vomiting.  ?Endocrine: Negative.   ?Genitourinary:  Negative for decreased urine volume, difficulty urinating, dysuria, frequency and urgency.  ?Musculoskeletal:  Negative for arthralgias and myalgias.  ?Skin: Negative.   ?Allergic/Immunologic: Negative.   ?Neurological:  Positive for weakness (improving). Negative for dizziness and headaches.  ?Hematological: Negative.    ?Psychiatric/Behavioral:  Negative for confusion, hallucinations, sleep disturbance and suicidal ideas.   ?All other systems reviewed and are negative. ? ?   ? ?Objective:  ?BP (!) 143/61   Pulse 63   Temp 98.4 ?F (36.9 ?C

## 2021-12-01 LAB — CBC WITH DIFFERENTIAL/PLATELET
Basophils Absolute: 0 10*3/uL (ref 0.0–0.2)
Basos: 0 %
EOS (ABSOLUTE): 0.1 10*3/uL (ref 0.0–0.4)
Eos: 1 %
Hematocrit: 32.4 % — ABNORMAL LOW (ref 34.0–46.6)
Hemoglobin: 10.9 g/dL — ABNORMAL LOW (ref 11.1–15.9)
Immature Grans (Abs): 0.1 10*3/uL (ref 0.0–0.1)
Immature Granulocytes: 1 %
Lymphocytes Absolute: 2.5 10*3/uL (ref 0.7–3.1)
Lymphs: 25 %
MCH: 30.4 pg (ref 26.6–33.0)
MCHC: 33.6 g/dL (ref 31.5–35.7)
MCV: 91 fL (ref 79–97)
Monocytes Absolute: 0.9 10*3/uL (ref 0.1–0.9)
Monocytes: 9 %
Neutrophils Absolute: 6.2 10*3/uL (ref 1.4–7.0)
Neutrophils: 64 %
Platelets: 292 10*3/uL (ref 150–450)
RBC: 3.58 x10E6/uL — ABNORMAL LOW (ref 3.77–5.28)
RDW: 13.3 % (ref 11.7–15.4)
WBC: 9.7 10*3/uL (ref 3.4–10.8)

## 2021-12-01 LAB — BMP8+EGFR
BUN/Creatinine Ratio: 28 (ref 12–28)
BUN: 17 mg/dL (ref 8–27)
CO2: 26 mmol/L (ref 20–29)
Calcium: 9.6 mg/dL (ref 8.7–10.3)
Chloride: 101 mmol/L (ref 96–106)
Creatinine, Ser: 0.61 mg/dL (ref 0.57–1.00)
Glucose: 113 mg/dL — ABNORMAL HIGH (ref 70–99)
Potassium: 4.1 mmol/L (ref 3.5–5.2)
Sodium: 139 mmol/L (ref 134–144)
eGFR: 88 mL/min/{1.73_m2} (ref >59)

## 2021-12-15 ENCOUNTER — Encounter: Payer: Self-pay | Admitting: Family Medicine

## 2021-12-15 ENCOUNTER — Telehealth: Payer: Self-pay | Admitting: Family Medicine

## 2021-12-15 ENCOUNTER — Ambulatory Visit (INDEPENDENT_AMBULATORY_CARE_PROVIDER_SITE_OTHER): Payer: Medicare Other | Admitting: Family Medicine

## 2021-12-15 VITALS — BP 133/80 | HR 58 | Temp 97.5°F | Ht 61.0 in | Wt 133.8 lb

## 2021-12-15 DIAGNOSIS — R059 Cough, unspecified: Secondary | ICD-10-CM | POA: Diagnosis not present

## 2021-12-15 DIAGNOSIS — J302 Other seasonal allergic rhinitis: Secondary | ICD-10-CM | POA: Diagnosis not present

## 2021-12-15 MED ORDER — BENZONATATE 100 MG PO CAPS: 100.0000 mg | ORAL_CAPSULE | Freq: Three times a day (TID) | ORAL | 0 refills | Status: DC | PRN

## 2021-12-15 MED ORDER — CETIRIZINE HCL 10 MG PO TABS: 5.0000 mg | ORAL_TABLET | Freq: Every day | ORAL | 11 refills | Status: DC

## 2021-12-15 NOTE — Telephone Encounter (Signed)
Called and left message to call back.

## 2021-12-15 NOTE — Telephone Encounter (Signed)
Patient stated that she is due for a DEXA scan. Please call to schedule.  ?

## 2021-12-15 NOTE — Progress Notes (Signed)
? ?Acute Office Visit ? ?Subjective:  ? ?  ?Patient ID: Melissa Carpenter, female    DOB: 1933/12/08, 88 y.o.   MRN: [id number] ? ?Chief Complaint  ?Patient presents with  ? trouble hearing  ?  Right ear x 2 weeks   ? Cough  ?  Since in the hospital on 4/8 but has gotten better. Worse a night.   ? Sore Throat  ?  Right side only x 2 weeks   ? ? ?HPI ?Patient is in today for cough and decreased hearing.  ? ?She reports decreased hearing in her right ear for 2 weeks. Her left ear is also popping frequently. She denies pain, fever, or drainage. She does report chronic nasal congestion and postnasal drip this spring. She also reports sore throat on right side for 2 weeks.  ? ?She has had a cough since her admission to the hospital on 11/19/21 for pneumonia. She has been taking tessalon perles prn with some improvement. She reports that the cough has been improving. She denies fever, shortness of breath, edema, chest pain, or orthopnea.  ? ?ROS ?As per HPI.  ? ?   ?Objective:  ?  ?BP 133/80   Pulse (!) 58   Temp (!) 97.5 ?F (36.4 ?C) (Temporal)   Ht '5\' 1"'$  (1.549 m)   Wt 133 lb 12.8 oz (60.7 kg)   SpO2 96%   BMI 25.28 kg/m?  ? ? ?Physical Exam ?Vitals and nursing note reviewed.  ?Constitutional:   ?   General: She is not in acute distress. ?   Appearance: She is not ill-appearing, toxic-appearing or diaphoretic.  ?HENT:  ?   Head: Normocephalic and atraumatic.  ?   Right Ear: Ear canal and external ear normal. A middle ear effusion is present. Tympanic membrane is not injected, scarred, perforated, erythematous, retracted or bulging.  ?   Left Ear: Ear canal and external ear normal. A middle ear effusion is present. Tympanic membrane is not scarred, perforated, erythematous, retracted or bulging.  ?   Nose: Congestion present.  ?   Mouth/Throat:  ?   Mouth: Mucous membranes are moist.  ?   Pharynx: Oropharynx is clear. No posterior oropharyngeal erythema or uvula swelling.  ?   Tonsils: No tonsillar exudate or tonsillar  abscesses. 0 on the right. 0 on the left.  ?Eyes:  ?   Conjunctiva/sclera: Conjunctivae normal.  ?   Pupils: Pupils are equal, round, and reactive to light.  ?Cardiovascular:  ?   Rate and Rhythm: Normal rate and regular rhythm.  ?   Heart sounds: Murmur heard.  ?  No friction rub. No gallop.  ?Pulmonary:  ?   Effort: Pulmonary effort is normal. No respiratory distress.  ?   Breath sounds: Normal breath sounds. No wheezing, rhonchi or rales.  ?Musculoskeletal:  ?   Right lower leg: No edema.  ?   Left lower leg: No edema.  ?Skin: ?   General: Skin is warm and dry.  ?Neurological:  ?   General: No focal deficit present.  ?   Mental Status: She is alert and oriented to person, place, and time.  ?Psychiatric:     ?   Mood and Affect: Mood normal.     ?   Behavior: Behavior normal.  ? ? ?No results found for any visits on 12/15/21. ? ? ?   ?Assessment & Plan:  ? ?Melissa Carpenter was seen today for trouble hearing, cough and sore throat. ? ?Diagnoses and all orders  for this visit: ? ?Seasonal allergies ?Add zyrtec daily. Continue flonase.  ?-     cetirizine (ZYRTEC) 10 MG tablet; Take 0.5 tablets (5 mg total) by mouth daily. ? ?Cough in adult patient ?Refill provided. Improving post pneumonia. Lungs clear today.  ?-     benzonatate (TESSALON PERLES) 100 MG capsule; Take 1 capsule (100 mg total) by mouth 3 (three) times daily as needed for cough. ? ? ?Return if symptoms worsen or fail to improve. ? ?The patient indicates understanding of these issues and agrees with the plan. ? ?Gwenlyn Perking, FNP ? ? ?

## 2021-12-15 NOTE — Patient Instructions (Signed)
Discuss repeat CT in 6-12 months for pulmonary nodule with Dr. Warrick Parisian at next follow up appointment.  ? ?Allergic Rhinitis, Adult ? ?Allergic rhinitis is an allergic reaction that affects the mucous membrane inside the nose. The mucous membrane is the tissue that produces mucus. ?There are two types of allergic rhinitis: ?Seasonal. This type is also called hay fever and happens only during certain seasons. ?Perennial. This type can happen at any time of the year. ?Allergic rhinitis cannot be spread from person to person. This condition can be mild, moderate, or severe. It can develop at any age and may be outgrown. ?What are the causes? ?This condition is caused by allergens. These are things that can cause an allergic reaction. Allergens may differ for seasonal allergic rhinitis and perennial allergic rhinitis. ?Seasonal allergic rhinitis is triggered by pollen. Pollen can come from grasses, trees, and weeds. ?Perennial allergic rhinitis may be triggered by: ?Dust mites. ?Proteins in a pet's urine, saliva, or dander. Dander is dead skin cells from a pet. ?Smoke, mold, or car fumes. ?What increases the risk? ?You are more likely to develop this condition if you have a family history of allergies or other conditions related to allergies, including: ?Allergic conjunctivitis. This is inflammation of parts of the eyes and eyelids. ?Asthma. This condition affects the lungs and makes it hard to breathe. ?Atopic dermatitis or eczema. This is long term (chronic) inflammation of the skin. ?Food allergies. ?What are the signs or symptoms? ?Symptoms of this condition include: ?Sneezing or coughing. ?A stuffy nose (nasal congestion), itchy nose, or nasal discharge. ?Itchy eyes and tearing of the eyes. ?A feeling of mucus dripping down the back of your throat (postnasal drip). ?Trouble sleeping. ?Tiredness or fatigue. ?Headache. ?Sore throat. ?How is this diagnosed? ?This condition may be diagnosed with your symptoms, medical  history, and physical exam. Your health care provider may check for related conditions, such as: ?Asthma. ?Pink eye. This is eye inflammation caused by infection (conjunctivitis). ?Ear infection. ?Upper respiratory infection. This is an infection in the nose, throat, or upper airways. ?You may also have tests to find out which allergens trigger your symptoms. These may include skin tests or blood tests. ?How is this treated? ?There is no cure for this condition, but treatment can help control symptoms. Treatment may include: ?Taking medicines that block allergy symptoms, such as corticosteroids and antihistamines. Medicine may be given as a shot, nasal spray, or pill. ?Avoiding any allergens. ?Being exposed again and again to tiny amounts of allergens to help you build a defense against allergens (immunotherapy). This is done if other treatments have not helped. It may include: ?Allergy shots. These are injected medicines that have small amounts of allergen in them. ?Sublingual immunotherapy. This involves taking small doses of a medicine with allergen in it under your tongue. ?If these treatments do not work, your health care provider may prescribe newer, stronger medicines. ?Follow these instructions at home: ?Avoiding allergens ?Find out what you are allergic to and avoid those allergens. These are some things you can do to help avoid allergens: ?If you have perennial allergies: ?Replace carpet with wood, tile, or vinyl flooring. Carpet can trap dander and dust. ?Do not smoke. Do not allow smoking in your home. ?Change your heating and air conditioning filters at least once a month. ?If you have seasonal allergies, take these steps during allergy season: ?Keep windows closed as much as possible. ?Plan outdoor activities when pollen counts are lowest. Check pollen counts before you plan outdoor  activities. ?When coming indoors, change clothing and shower before sitting on furniture or bedding. ?If you have a pet  in the house that produces allergens: ?Keep the pet out of the bedroom. ?Vacuum, sweep, and dust regularly. ?General instructions ?Take over-the-counter and prescription medicines only as told by your health care provider. ?Drink enough fluid to keep your urine pale yellow. ?Keep all follow-up visits as told by your health care provider. This is important. ?Where to find more information ?American Academy of Allergy, Asthma & Immunology: www.aaaai.org ?Contact a health care provider if: ?You have a fever. ?You develop a cough that does not go away. ?You make whistling sounds when you breathe (wheeze). ?Your symptoms slow you down or stop you from doing your normal activities each day. ?Get help right away if: ?You have shortness of breath. ?This symptom may represent a serious problem that is an emergency. Do not wait to see if the symptom will go away. Get medical help right away. Call your local emergency services (911 in the [place] Do not drive yourself to the hospital. ?Summary ?Allergic rhinitis may be managed by taking medicines as directed and avoiding allergens. ?If you have seasonal allergies, keep windows closed as much as possible during allergy season. ?Contact your health care provider if you develop a fever or a cough that does not go away. ?This information is not intended to replace advice given to you by your health care provider. Make sure you discuss any questions you have with your health care provider. ?Document Revised: 09/19/2019 Document Reviewed: 07/29/2019 ?Elsevier Patient Education ? [place]. ? ?

## 2021-12-15 NOTE — Telephone Encounter (Signed)
Appt has been made.

## 2021-12-16 ENCOUNTER — Other Ambulatory Visit: Payer: Self-pay

## 2021-12-16 ENCOUNTER — Ambulatory Visit (INDEPENDENT_AMBULATORY_CARE_PROVIDER_SITE_OTHER): Payer: Medicare Other

## 2021-12-16 DIAGNOSIS — Z780 Asymptomatic menopausal state: Secondary | ICD-10-CM

## 2021-12-16 DIAGNOSIS — M8589 Other specified disorders of bone density and structure, multiple sites: Secondary | ICD-10-CM | POA: Diagnosis not present

## 2021-12-26 ENCOUNTER — Telehealth: Payer: Self-pay | Admitting: Family Medicine

## 2022-01-10 ENCOUNTER — Other Ambulatory Visit: Payer: Self-pay | Admitting: Family Medicine

## 2022-01-10 DIAGNOSIS — Z1231 Encounter for screening mammogram for malignant neoplasm of breast: Secondary | ICD-10-CM

## 2022-01-13 ENCOUNTER — Telehealth: Payer: Self-pay | Admitting: Family Medicine

## 2022-01-16 ENCOUNTER — Other Ambulatory Visit: Payer: Medicare Other

## 2022-01-16 DIAGNOSIS — R7303 Prediabetes: Secondary | ICD-10-CM

## 2022-01-16 DIAGNOSIS — I10 Essential (primary) hypertension: Secondary | ICD-10-CM | POA: Diagnosis not present

## 2022-01-16 DIAGNOSIS — E785 Hyperlipidemia, unspecified: Secondary | ICD-10-CM

## 2022-01-16 LAB — BMP8+EGFR
BUN/Creatinine Ratio: 25 (ref 12–28)
BUN: 12 mg/dL (ref 8–27)
CO2: 20 mmol/L (ref 20–29)
Calcium: 9.2 mg/dL (ref 8.7–10.3)
Chloride: 109 mmol/L — ABNORMAL HIGH (ref 96–106)
Creatinine, Ser: 0.48 mg/dL — ABNORMAL LOW (ref 0.57–1.00)
Glucose: 102 mg/dL — ABNORMAL HIGH (ref 70–99)
Potassium: 4.1 mmol/L (ref 3.5–5.2)
Sodium: 144 mmol/L (ref 134–144)
eGFR: 93 mL/min/{1.73_m2} (ref >59)

## 2022-01-16 LAB — CBC WITH DIFFERENTIAL/PLATELET
Basophils Absolute: 0 10*3/uL (ref 0.0–0.2)
Basos: 1 %
EOS (ABSOLUTE): 0.2 10*3/uL (ref 0.0–0.4)
Eos: 4 %
Hematocrit: 36.2 % (ref 34.0–46.6)
Hemoglobin: 12.5 g/dL (ref 11.1–15.9)
Immature Grans (Abs): 0 10*3/uL (ref 0.0–0.1)
Immature Granulocytes: 0 %
Lymphocytes Absolute: 1.1 10*3/uL (ref 0.7–3.1)
Lymphs: 23 %
MCH: 31 pg (ref 26.6–33.0)
MCHC: 34.5 g/dL (ref 31.5–35.7)
MCV: 90 fL (ref 79–97)
Monocytes Absolute: 0.6 10*3/uL (ref 0.1–0.9)
Monocytes: 12 %
Neutrophils Absolute: 2.9 10*3/uL (ref 1.4–7.0)
Neutrophils: 60 %
Platelets: 182 10*3/uL (ref 150–450)
RBC: 4.03 x10E6/uL (ref 3.77–5.28)
RDW: 13 % (ref 11.7–15.4)
WBC: 4.8 10*3/uL (ref 3.4–10.8)

## 2022-01-16 LAB — BAYER DCA HB A1C WAIVED: HB A1C (BAYER DCA - WAIVED): 5.8 % — ABNORMAL HIGH (ref 4.8–5.6)

## 2022-01-18 ENCOUNTER — Ambulatory Visit (INDEPENDENT_AMBULATORY_CARE_PROVIDER_SITE_OTHER): Payer: Medicare Other | Admitting: Family Medicine

## 2022-01-18 ENCOUNTER — Encounter: Payer: Self-pay | Admitting: Family Medicine

## 2022-01-18 VITALS — BP 155/60 | HR 68 | Temp 98.2°F | Ht 61.0 in | Wt 135.0 lb

## 2022-01-18 DIAGNOSIS — R7303 Prediabetes: Secondary | ICD-10-CM

## 2022-01-18 DIAGNOSIS — I6529 Occlusion and stenosis of unspecified carotid artery: Secondary | ICD-10-CM | POA: Diagnosis not present

## 2022-01-18 DIAGNOSIS — I350 Nonrheumatic aortic (valve) stenosis: Secondary | ICD-10-CM

## 2022-01-18 DIAGNOSIS — R599 Enlarged lymph nodes, unspecified: Secondary | ICD-10-CM

## 2022-01-18 DIAGNOSIS — I10 Essential (primary) hypertension: Secondary | ICD-10-CM | POA: Diagnosis not present

## 2022-01-18 DIAGNOSIS — E785 Hyperlipidemia, unspecified: Secondary | ICD-10-CM

## 2022-01-18 NOTE — Progress Notes (Signed)
BP (!) 155/60   Pulse 68   Temp 98.2 F (36.8 C)   Ht 5\' 1"  (1.549 m)   Wt 135 lb (61.2 kg)   SpO2 94%   BMI 25.51 kg/m    Subjective:   Patient ID: Melissa Carpenter, female    DOB: 1934-07-26, 88 y.o.   MRN: [id number]  HPI: Melissa Carpenter is a 88 y.o. female presenting on 01/18/2022 for Medical Management of Chronic Issues, Hyperlipidemia, Hypertension, and Discuss recent imaging from [facility] (Thyroid/Lung Nodule)   HPI Prediabetes Patient comes in today for recheck of his diabetes. Patient has been currently taking no medicine for the diabetes, diet controlled, A1c is 5.8 today. Patient is currently on an ACE inhibitor/ARB. Patient has not seen an ophthalmologist this year. Patient denies any issues with their feet. The symptom started onset as an adult hypertension and hyperlipidemia and CAD ARE RELATED TO DM   Hypertension Patient is currently on amlodipine and furosemide as needed and hydralazine and metoprolol and telmisartan, and their blood pressure today is 155/60 and 145/59. Patient denies any lightheadedness or dizziness. Patient denies headaches, blurred vision, chest pains, shortness of breath, or weakness. Denies any side effects from medication and is content with current medication.   Hyperlipidemia Patient is coming in for recheck of his hyperlipidemia. The patient is currently taking fish oils. They deny any issues with myalgias or history of liver damage from it. They deny any focal numbness or weakness or chest pain.   Patient has a history of CT showing subcarinal lymph node that was 1.8 mm in size and recommended repeat in 3 months.  Its been almost 3 months we will schedule for it.  Relevant past medical, surgical, family and social history reviewed and updated as indicated. Interim medical history since our last visit reviewed. Allergies and medications reviewed and updated.  Review of Systems  Constitutional:  Negative for chills and fever.  HENT:  Negative for ear  pain.   Eyes:  Negative for visual disturbance.  Respiratory:  Negative for chest tightness and shortness of breath.   Cardiovascular:  Negative for chest pain and leg swelling.  Genitourinary:  Negative for difficulty urinating and dysuria.  Musculoskeletal:  Negative for back pain and gait problem.  Skin:  Negative for rash.  Neurological:  Negative for light-headedness and headaches.  Psychiatric/Behavioral:  Negative for agitation and behavioral problems.   All other systems reviewed and are negative.  Per HPI unless specifically indicated above   Allergies as of 01/18/2022       Reactions   Codeine Hives   Forteo [parathyroid Hormone (recomb)] Other (See Comments)   Weakness and calcium increase   Raloxifene Other (See Comments)   Eye problems Other reaction(s): Other (See Comments) Eye problems Eye problems   Morphine Rash   Penicillins Rash   Sulfites Rash   Sulfonamide Derivatives Rash        Medication List        Accurate as of January 18, 2022 11:13 AM. If you have any questions, ask your nurse or doctor.          Accu-Chek Softclix Lancets lancets Use to check blood sugars daily   albuterol 108 (90 Base) MCG/ACT inhaler Commonly known as: VENTOLIN HFA Inhale 2 puffs into the lungs every 6 (six) hours as needed.   amLODipine-atorvastatin 10-40 MG tablet Commonly known as: CADUET Take 1 tablet by mouth daily.   aspirin 81 MG tablet Take 81 mg by mouth daily.  benzonatate 100 MG capsule Commonly known as: Tessalon Perles Take 1 capsule (100 mg total) by mouth 3 (three) times daily as needed for cough.   Calcium Carbonate-Vitamin D 600-400 MG-UNIT tablet Commonly known as: Caltrate 600+D Take 1 tablet by mouth daily.   cetirizine 10 MG tablet Commonly known as: ZYRTEC Take 0.5 tablets (5 mg total) by mouth daily.   cholecalciferol 10 MCG (400 UNIT) Tabs tablet Commonly known as: VITAMIN D3 Take 1,000 Units by mouth.   fish oil-omega-3 fatty  acids 1000 MG capsule Take 1 g by mouth daily.   fluticasone 50 MCG/ACT nasal spray Commonly known as: FLONASE SPRAY 2 SPRAYS IN EACH NOSTRIL EVERY DAY What changed: See the new instructions.   furosemide 20 MG tablet Commonly known as: Lasix Take 1 tablet (20 mg total) by mouth daily.   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   metoprolol succinate 100 MG 24 hr tablet Commonly known as: TOPROL-XL Take 1 tablet (100 mg total) by mouth daily. TAKE WITH OR IMMEDIATELY FOLLOWING A MEAL.   multivitamin tablet Take 1 tablet by mouth daily.   telmisartan 80 MG tablet Commonly known as: MICARDIS TAKE 1 TABLET BY MOUTH EVERY DAY IN THE EVENING   triamcinolone cream 0.1 % Commonly known as: KENALOG Apply 1 application topically 2 (two) times daily.         Objective:   BP (!) 155/60   Pulse 68   Temp 98.2 F (36.8 C)   Ht 5\' 1"  (1.549 m)   Wt 135 lb (61.2 kg)   SpO2 94%   BMI 25.51 kg/m   Wt Readings from Last 3 Encounters:  01/18/22 135 lb (61.2 kg)  12/15/21 133 lb 12.8 oz (60.7 kg)  11/30/21 132 lb (59.9 kg)    Physical Exam Vitals and nursing note reviewed.  Constitutional:      General: She is not in acute distress.    Appearance: She is well-developed. She is not diaphoretic.  Eyes:     Conjunctiva/sclera: Conjunctivae normal.  Cardiovascular:     Rate and Rhythm: Normal rate and regular rhythm.     Heart sounds: Murmur (Grade 4 holosystolic murmur at left intercostal space) heard.  Pulmonary:     Effort: Pulmonary effort is normal. No respiratory distress.     Breath sounds: Normal breath sounds. No wheezing.  Musculoskeletal:        General: No tenderness. Normal range of motion.  Skin:    General: Skin is warm and dry.     Findings: No rash.  Neurological:     Mental Status: She is alert and  oriented to person, place, and time.     Coordination: Coordination normal.  Psychiatric:        Behavior: Behavior normal.    Results for orders placed or performed in visit on 01/16/22  Bayer DCA Hb A1c Waived  Result Value Ref Range   HB A1C (BAYER DCA - WAIVED) 5.8 (H) 4.8 - 5.6 %  CBC with Differential/Platelet  Result Value Ref Range   WBC 4.8 3.4 - 10.8 x10E3/uL   RBC 4.03 3.77 - 5.28 x10E6/uL   Hemoglobin 12.5 11.1 - 15.9 g/dL   Hematocrit 40.9 81.1 - 46.6 %   MCV 90 79 - 97 fL   MCH 31.0 26.6 - 33.0 pg  MCHC 34.5 31.5 - 35.7 g/dL   RDW 16.1 09.6 - 04.5 %   Platelets 182 150 - 450 x10E3/uL   Neutrophils 60 Not Estab. %   Lymphs 23 Not Estab. %   Monocytes 12 Not Estab. %   Eos 4 Not Estab. %   Basos 1 Not Estab. %   Neutrophils Absolute 2.9 1.4 - 7.0 x10E3/uL   Lymphocytes Absolute 1.1 0.7 - 3.1 x10E3/uL   Monocytes Absolute 0.6 0.1 - 0.9 x10E3/uL   EOS (ABSOLUTE) 0.2 0.0 - 0.4 x10E3/uL   Basophils Absolute 0.0 0.0 - 0.2 x10E3/uL   Immature Granulocytes 0 Not Estab. %   Immature Grans (Abs) 0.0 0.0 - 0.1 x10E3/uL  BMP8+EGFR  Result Value Ref Range   Glucose 102 (H) 70 - 99 mg/dL   BUN 12 8 - 27 mg/dL   Creatinine, Ser 4.09 (L) 0.57 - 1.00 mg/dL   eGFR 93 >81 XB/JYN/8.29   BUN/Creatinine Ratio 25 12 - 28   Sodium 144 134 - 144 mmol/L   Potassium 4.1 3.5 - 5.2 mmol/L   Chloride 109 (H) 96 - 106 mmol/L   CO2 20 20 - 29 mmol/L   Calcium 9.2 8.7 - 10.3 mg/dL    Assessment & Plan:   Problem List Items Addressed This Visit       Cardiovascular and Mediastinum   Essential hypertension (Chronic)   Moderate aortic stenosis     Other   Dyslipidemia (Chronic)   Pre-diabetes   Other Visit Diagnoses     Enlarged lymph node    -  Primary   Subcarinal   Relevant Orders   CT Chest W Contrast       We will do CT chest because of subcarinal nodule and follow-up on that.  Blood work from 2 days ago looks good.  A1c was 5.8 and the rest look great.   Follow  up plan: Return in about 4 months (around 05/20/2022), or if symptoms worsen or fail to improve, for PreDiabetes hypertension and cholesterol.  Counseling provided for all of the vaccine components Orders Placed This Encounter  Procedures   CT Chest W Contrast    Arville Care, MD [place] [facility] 01/18/2022, 11:13 AM

## 2022-02-03 ENCOUNTER — Encounter (HOSPITAL_BASED_OUTPATIENT_CLINIC_OR_DEPARTMENT_OTHER): Payer: Self-pay

## 2022-02-03 ENCOUNTER — Ambulatory Visit (HOSPITAL_BASED_OUTPATIENT_CLINIC_OR_DEPARTMENT_OTHER)
Admission: RE | Admit: 2022-02-03 | Discharge: 2022-02-03 | Disposition: A | Discharge status: Home / Self Care | Payer: Medicare Other | Source: Ambulatory Visit | Attending: Family Medicine | Admitting: Family Medicine

## 2022-02-03 DIAGNOSIS — R599 Enlarged lymph nodes, unspecified: Secondary | ICD-10-CM | POA: Insufficient documentation

## 2022-02-03 DIAGNOSIS — I2510 Atherosclerotic heart disease of native coronary artery without angina pectoris: Secondary | ICD-10-CM | POA: Diagnosis not present

## 2022-02-03 MED ORDER — IOHEXOL 300 MG/ML  SOLN
100.0000 mL | Freq: Once | INTRAMUSCULAR | Status: AC | PRN
  Administered 2022-02-03: 75 mL via INTRAVENOUS

## 2022-02-06 ENCOUNTER — Ambulatory Visit
Admission: RE | Admit: 2022-02-06 | Discharge: 2022-02-06 | Disposition: A | Discharge status: Home / Self Care | Payer: Medicare Other | Source: Ambulatory Visit | Attending: Family Medicine | Admitting: Family Medicine

## 2022-02-06 DIAGNOSIS — Z1231 Encounter for screening mammogram for malignant neoplasm of breast: Secondary | ICD-10-CM | POA: Diagnosis not present

## 2022-02-07 ENCOUNTER — Other Ambulatory Visit: Payer: Self-pay

## 2022-02-07 DIAGNOSIS — E041 Nontoxic single thyroid nodule: Secondary | ICD-10-CM

## 2022-02-09 ENCOUNTER — Telehealth: Payer: Self-pay | Admitting: Family Medicine

## 2022-02-09 NOTE — Telephone Encounter (Signed)
Spoke with patient, appointment scheduled 02/27/22 with Dr. Warrick Parisian.

## 2022-02-10 ENCOUNTER — Ambulatory Visit: Payer: Medicare Other | Admitting: Family Medicine

## 2022-02-16 ENCOUNTER — Ambulatory Visit (HOSPITAL_COMMUNITY)
Admission: RE | Admit: 2022-02-16 | Discharge: 2022-02-16 | Disposition: A | Discharge status: Home / Self Care | Payer: Medicare Other | Source: Ambulatory Visit | Attending: Family Medicine | Admitting: Family Medicine

## 2022-02-16 DIAGNOSIS — E041 Nontoxic single thyroid nodule: Secondary | ICD-10-CM | POA: Diagnosis not present

## 2022-02-20 ENCOUNTER — Other Ambulatory Visit: Payer: Self-pay | Admitting: Family Medicine

## 2022-02-20 DIAGNOSIS — E041 Nontoxic single thyroid nodule: Secondary | ICD-10-CM

## 2022-02-24 ENCOUNTER — Ambulatory Visit (HOSPITAL_COMMUNITY): Admission: RE | Admit: 2022-02-24 | Payer: Medicare Other | Source: Ambulatory Visit

## 2022-02-27 ENCOUNTER — Ambulatory Visit: Payer: Medicare Other | Admitting: Family Medicine

## 2022-03-01 ENCOUNTER — Ambulatory Visit (HOSPITAL_COMMUNITY)
Admission: RE | Admit: 2022-03-01 | Discharge: 2022-03-01 | Disposition: A | Discharge status: Home / Self Care | Payer: Medicare Other | Source: Ambulatory Visit | Attending: Family Medicine | Admitting: Family Medicine

## 2022-03-01 ENCOUNTER — Encounter (HOSPITAL_COMMUNITY): Payer: Self-pay

## 2022-03-01 DIAGNOSIS — E041 Nontoxic single thyroid nodule: Secondary | ICD-10-CM | POA: Diagnosis not present

## 2022-03-01 MED ORDER — LIDOCAINE HCL (PF) 2 % IJ SOLN
INTRAMUSCULAR | Status: AC
  Filled 2022-03-01: qty 10

## 2022-03-02 LAB — CYTOLOGY - NON PAP

## 2022-03-03 ENCOUNTER — Encounter: Payer: Self-pay | Admitting: Family Medicine

## 2022-03-03 ENCOUNTER — Ambulatory Visit (INDEPENDENT_AMBULATORY_CARE_PROVIDER_SITE_OTHER): Payer: Medicare Other | Admitting: Family Medicine

## 2022-03-03 VITALS — BP 152/65 | HR 63 | Temp 98.3°F | Ht 61.0 in | Wt 133.0 lb

## 2022-03-03 DIAGNOSIS — E041 Nontoxic single thyroid nodule: Secondary | ICD-10-CM

## 2022-03-03 DIAGNOSIS — M8580 Other specified disorders of bone density and structure, unspecified site: Secondary | ICD-10-CM

## 2022-03-03 DIAGNOSIS — I6529 Occlusion and stenosis of unspecified carotid artery: Secondary | ICD-10-CM | POA: Diagnosis not present

## 2022-03-03 NOTE — Progress Notes (Signed)
BP (!) 152/65   Pulse 63   Temp 98.3 F (36.8 C)   Ht '5\' 1"'$  (1.549 m)   Wt 133 lb (60.3 kg)   SpO2 94%   BMI 25.13 kg/m    Subjective:   Patient ID: Melissa Carpenter, female    DOB: [birth year], 88 y.o.   MRN: [id number]  HPI: Melissa Carpenter is a 88 y.o. female presenting on 03/03/2022 for Discuss recent labs, thyroid bx, dexa   HPI Thyroid nodule follow-up Patient is coming in today for follow-up regarding her thyroid nodule.  She denies any diarrhea and biopsy showing that it was benign looks good.  We will follow-up in a year.  Osteoporosis/osteopenia Fractures or history of fracture: None Medication: Calcium and vitamin D Duration of treatment: More than 5 years Last bone density scan: Less than a year ago,  Last T score: -2.3   Relevant past medical, surgical, family and social history reviewed and updated as indicated. Interim medical history since our last visit reviewed. Allergies and medications reviewed and updated.  Review of Systems  Constitutional:  Negative for chills and fever.  HENT:  Negative for congestion, ear discharge and ear pain.   Eyes:  Negative for redness and visual disturbance.  Respiratory:  Negative for chest tightness and shortness of breath.   Cardiovascular:  Negative for chest pain and leg swelling.  Genitourinary:  Negative for difficulty urinating and dysuria.  Musculoskeletal:  Negative for back pain and gait problem.  Skin:  Negative for rash.  Neurological:  Negative for dizziness, light-headedness and headaches.  Psychiatric/Behavioral:  Negative for agitation and behavioral problems.   All other systems reviewed and are negative.   Per HPI unless specifically indicated above   Allergies as of 03/03/2022       Reactions   Codeine Hives   Forteo [parathyroid Hormone (recomb)] Other (See Comments)   Weakness and calcium increase   Raloxifene Other (See Comments)   Eye problems Other reaction(s): Other (See Comments) Eye  problems Eye problems   Morphine Rash   Penicillins Rash   Sulfites Rash   Sulfonamide Derivatives Rash        Medication List        Accurate as of March 03, 2022  9:48 AM. If you have any questions, ask your nurse or doctor.          Accu-Chek Softclix Lancets lancets Use to check blood sugars daily   albuterol 108 (90 Base) MCG/ACT inhaler Commonly known as: VENTOLIN HFA Inhale 2 puffs into the lungs every 6 (six) hours as needed.   amLODipine-atorvastatin 10-40 MG tablet Commonly known as: CADUET Take 1 tablet by mouth daily.   aspirin 81 MG tablet Take 81 mg by mouth daily.   benzonatate 100 MG capsule Commonly known as: Tessalon Perles Take 1 capsule (100 mg total) by mouth 3 (three) times daily as needed for cough.   Calcium Carbonate-Vitamin D 600-400 MG-UNIT tablet Commonly known as: Caltrate 600+D Take 1 tablet by mouth daily.   cetirizine 10 MG tablet Commonly known as: ZYRTEC Take 0.5 tablets (5 mg total) by mouth daily.   cholecalciferol 10 MCG (400 UNIT) Tabs tablet Commonly known as: VITAMIN D3 Take 1,000 Units by mouth.   fish oil-omega-3 fatty acids 1000 MG capsule Take 1 g by mouth daily.   fluticasone 50 MCG/ACT nasal spray Commonly known as: FLONASE SPRAY 2 SPRAYS IN EACH NOSTRIL EVERY DAY What changed: See the new instructions.   furosemide 20 MG  tablet Commonly known as: Lasix Take 1 tablet (20 mg total) by mouth daily.   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   metoprolol succinate 100 MG 24 hr tablet Commonly known as: TOPROL-XL Take 1 tablet (100 mg total) by mouth daily. TAKE WITH OR IMMEDIATELY FOLLOWING A MEAL.   multivitamin tablet Take 1 tablet by mouth daily.   telmisartan 80 MG tablet Commonly known as: MICARDIS TAKE 1 TABLET BY MOUTH EVERY DAY IN THE EVENING    triamcinolone cream 0.1 % Commonly known as: KENALOG Apply 1 application topically 2 (two) times daily.         Objective:   BP (!) 152/65   Pulse 63   Temp 98.3 F (36.8 C)   Ht '5\' 1"'$  (1.549 m)   Wt 133 lb (60.3 kg)   SpO2 94%   BMI 25.13 kg/m   Wt Readings from Last 3 Encounters:  03/03/22 133 lb (60.3 kg)  01/18/22 135 lb (61.2 kg)  12/15/21 133 lb 12.8 oz (60.7 kg)    Physical Exam Vitals and nursing note reviewed.  Constitutional:      General: She is not in acute distress.    Appearance: She is well-developed. She is not diaphoretic.  Eyes:     Conjunctiva/sclera: Conjunctivae normal.  Cardiovascular:     Rate and Rhythm: Normal rate and regular rhythm.     Heart sounds: Normal heart sounds. No murmur heard. Pulmonary:     Effort: Pulmonary effort is normal. No respiratory distress.     Breath sounds: Normal breath sounds. No wheezing.  Musculoskeletal:        General: No tenderness. Normal range of motion.  Skin:    General: Skin is warm and dry.     Findings: No rash.  Neurological:     Mental Status: She is alert and oriented to person, place, and time.     Coordination: Coordination normal.  Psychiatric:        Behavior: Behavior normal.       Assessment & Plan:   Problem List Items Addressed This Visit   None Visit Diagnoses     Thyroid nodule    -  Primary   Osteopenia, unspecified location         Reassurance and follow-up in the future  Follow up plan: Return if symptoms worsen or fail to improve, for Cholesterol.  Counseling provided for all of the vaccine components No orders of the defined types were placed in this encounter.   Melissa Pina, MD [facility] 03/03/2022, 9:48 AM

## 2022-03-23 DIAGNOSIS — M79642 Pain in left hand: Secondary | ICD-10-CM | POA: Diagnosis not present

## 2022-03-23 DIAGNOSIS — M79641 Pain in right hand: Secondary | ICD-10-CM | POA: Diagnosis not present

## 2022-04-25 ENCOUNTER — Other Ambulatory Visit: Payer: Self-pay | Admitting: Family Medicine

## 2022-04-25 NOTE — Telephone Encounter (Signed)
This can wait until pcp return tomorrow.  Do not see this issue addressed in >1 year

## 2022-04-25 NOTE — Telephone Encounter (Signed)
[name] patient Last office visit 03/03/22 Last refill 03/23/21, 80 grams 3 refills

## 2022-05-11 ENCOUNTER — Ambulatory Visit (INDEPENDENT_AMBULATORY_CARE_PROVIDER_SITE_OTHER): Payer: Medicare Other

## 2022-05-11 DIAGNOSIS — Z0000 Encounter for general adult medical examination without abnormal findings: Secondary | ICD-10-CM | POA: Diagnosis not present

## 2022-05-11 NOTE — Progress Notes (Signed)
MEDICARE ANNUAL WELLNESS VISIT  05/11/2022  Telephone Visit Disclaimer This Medicare AWV was conducted by telephone due to national recommendations for restrictions regarding the COVID-19 Pandemic (e.g. social distancing).  I verified, using two identifiers, that I am speaking with Melissa Carpenter or their authorized healthcare agent. I discussed the limitations, risks, security, and privacy concerns of performing an evaluation and management service by telephone and the potential availability of an in-person appointment in the future. The patient expressed understanding and agreed to proceed.  Location of Patient: Home Location of Provider (nurse):  [facility]  Subjective:    Melissa Carpenter is a 88 y.o. female patient of [name], [name], MD who had a Medicare Annual Wellness Visit today via telephone. Garyn is Retired and lives with their spouse. She has four children. She reports that she is socially active and does interact with friends/family regularly.  She is minimally physically active and enjoys spending time with family.  Patient Care Team: [name], [name], MD as PCP - General (Family Medicine) Paralee Cancel, MD as Consulting Physician (Orthopedic Surgery) Minus Breeding, MD as Consulting Physician (Cardiology) Angelia Mould, MD as Consulting Physician (Vascular Surgery)     05/11/2022    9:08 AM 11/19/2021    1:01 PM 05/10/2021    9:10 AM 06/11/2018   11:11 AM 03/08/2016    9:38 AM 05/28/2015   12:56 PM 03/08/2015    8:30 AM  Advanced Directives  Does Patient Have a Medical Advance Directive? No No;Yes No;Yes Yes Yes Yes Yes  Type of Corporate treasurer of [place];Living will [place];Living will [place];Living will [place];Living will [place];Living will [place];Living will  Does patient want to make changes to medical advance directive?    No -  Patient declined No - Patient declined    Copy of [place] in Chart?   Yes - validated most recent copy scanned in chart (See row information) No - copy requested Yes No - copy requested No - copy requested  Would patient like information on creating a medical advance directive? No - Patient declined          Hospital Utilization Over the Past 12 Months: # of hospitalizations or ER visits: 1 # of surgeries: 0  Review of Systems    Patient reports that her overall health is unchanged compared to last year.  History obtained from chart review and the patient  Patient Reported Readings (BP, Pulse, CBG, Weight, etc) none  Pain Assessment Pain : No/denies pain     Current Medications & Allergies (verified) Allergies as of 05/11/2022       Reactions   Codeine Hives   Forteo [parathyroid Hormone (recomb)] Other (See Comments)   Weakness and calcium increase   Raloxifene Other (See Comments)   Eye problems Other reaction(s): Other (See Comments) Eye problems Eye problems   Morphine Rash   Penicillins Rash   Sulfites Rash   Sulfonamide Derivatives Rash        Medication List        Accurate as of May 11, 2022  9:13 AM. If you have any questions, ask your nurse or doctor.          STOP taking these medications    benzonatate 100 MG capsule Commonly known as: Tessalon Perles   cetirizine 10 MG tablet Commonly known as: ZYRTEC   fluticasone 50 MCG/ACT nasal spray  Commonly known as: FLONASE       TAKE these medications    Accu-Chek Softclix Lancets lancets Use to check blood sugars daily   albuterol 108 (90 Base) MCG/ACT inhaler Commonly known as: VENTOLIN HFA Inhale 2 puffs into the lungs every 6 (six) hours as needed.   amLODipine-atorvastatin 10-40 MG tablet Commonly known as: CADUET Take 1 tablet by mouth daily.   aspirin 81 MG tablet Take 81 mg by mouth daily.   Calcium Carbonate-Vitamin D 600-400 MG-UNIT  tablet Commonly known as: Caltrate 600+D Take 1 tablet by mouth daily.   cholecalciferol 10 MCG (400 UNIT) Tabs tablet Commonly known as: VITAMIN D3 Take 1,000 Units by mouth.   fish oil-omega-3 fatty acids 1000 MG capsule Take 1 g by mouth daily.   furosemide 20 MG tablet Commonly known as: Lasix Take 1 tablet (20 mg total) by mouth daily.   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   metoprolol succinate 100 MG 24 hr tablet Commonly known as: TOPROL-XL Take 1 tablet (100 mg total) by mouth daily. TAKE WITH OR IMMEDIATELY FOLLOWING A MEAL.   multivitamin tablet Take 1 tablet by mouth daily.   telmisartan 80 MG tablet Commonly known as: MICARDIS TAKE 1 TABLET BY MOUTH EVERY DAY IN THE EVENING   triamcinolone cream 0.1 % Commonly known as: KENALOG APPLY TO AFFECTED AREA TWICE A DAY        History (reviewed): Past Medical History:  Diagnosis Date   Allergy    Aortic insufficiency    Mild, echo, December, 2009   Arthritis    Carotid artery disease ([place])    Cataract    Cellulitis    Chest pain    Catheterization, 2003, no significant CAD   Diastolic dysfunction    Mild diastolic dysfunction, echo, December, 2012   DJD (degenerative joint disease)    Dyslipidemia    Edema    November, 2012   GERD (gastroesophageal reflux disease)    Hyperlipidemia    Hypertension    Mitral valve prolapse    Echo, 2009, very mild intermittent prolapse of the posterior leaflet, no MR   Osteoporosis    Prediabetes    Tachycardia    Nighttime tachycardia, February, 2014   Thyroid nodule    Past Surgical History:  Procedure Laterality Date   ABDOMINAL HYSTERECTOMY  1964   APPENDECTOMY  1946   BIOPSY THYROID Left    CARDIAC CATHETERIZATION  sept 2003   no significant cad   CATARACT EXTRACTION, BILATERAL     COLONOSCOPY      NASAL SINUS SURGERY     TONSILLECTOMY     Family History  Problem Relation Age of Onset   Heart attack Mother    Hypertension Mother    Throat cancer Mother        THROAT / VOCAL CORD   Heart disease Mother    Hypertension Father    Heart disease Father    Kidney disease Father        failure   Bone cancer Sister    Stroke Sister    Bone cancer Grandchild        in rib cage   Hypertension Sister    Metabolic syndrome Sister        pre diabetes   GER disease Sister    Heart  disease Sister        CAD   Heart attack Brother    Cirrhosis Brother    Breast cancer Sister    Bone cancer Sister    Hyperlipidemia Sister    Heart attack Sister    Stroke Son    Colitis Neg Hx    Esophageal cancer Neg Hx    Stomach cancer Neg Hx    Rectal cancer Neg Hx    Social History   Socioeconomic History   Marital status: Married    Spouse name: Thersa [employer] account planner)   Number of children: 4   Years of education: Not on file   Highest education level: Not on file  Occupational History   Occupation: retired    Fish farm manager: [employer]  Tobacco Use   Smoking status: Never   Smokeless tobacco: Never  Vaping Use   Vaping Use: Never used  Substance and Sexual Activity   Alcohol use: No    Alcohol/week: 0.0 standard drinks of alcohol   Drug use: No   Sexual activity: Never  Other Topics Concern   Not on file  Social History Narrative   Lives in 2 story home with her husband   Children live nearby   Social Determinants of Health   Financial Resource Strain: [place]  (05/10/2021)   Overall Financial Resource Strain (CARDIA)    Difficulty of Paying Living Expenses: Not very hard  Food Insecurity: No Food Insecurity (05/10/2021)   Hunger Vital Sign    Worried About Running Out of Food in the Last Year: Never true    [place] in the Last Year: Never true  Transportation Needs: No Transportation Needs (05/10/2021)   PRAPARE - Hydrologist  (Medical): No    Lack of Transportation (Non-Medical): No  Physical Activity: Insufficiently Active (05/10/2021)   Exercise Vital Sign    Days of Exercise per Week: 7 days    Minutes of Exercise per Session: 10 min  Stress: No Stress Concern Present (05/10/2021)   [place]    Feeling of Stress : Not at all  Social Connections: Moderately Isolated (05/10/2021)   Social Connection and Isolation Panel [NHANES]    Frequency of Communication with Friends and Family: More than three times a week    Frequency of Social Gatherings with Friends and Family: More than three times a week    Attends Religious Services: Never    Marine scientist or Organizations: No    Attends Archivist Meetings: Never    Marital Status: Married    Activities of Daily Living    05/11/2022    9:08 AM 11/19/2021    5:40 PM  In your present state of health, do you have any difficulty performing the following activities:  Hearing? 0 0  Vision? 0 0  Difficulty concentrating or making decisions? 0 0  Walking or climbing stairs? 0 0  Dressing or bathing? 0 0  Doing errands, shopping? 0 0  Preparing Food and eating ? N   Using the Toilet? N   In the past six months, have you accidently leaked urine? N   Do you have problems with loss of bowel control? N   Managing your Medications? N   Managing your Finances? N   Housekeeping or managing your Housekeeping? N     Patient Education/ Literacy How often do you need to have someone help you when  you read instructions, pamphlets, or other written materials from your doctor or pharmacy?: 1 - Never What is the last grade level you completed in school?: 12th grade  Exercise Current Exercise Habits: The patient does not participate in regular exercise at present, Exercise limited by: None identified  Diet Patient reports consuming 3 meals a day and 1 snack(s) a day Patient reports that  her primary diet is: Regular Patient reports that she does have regular access to food.   Depression Screen    05/11/2022    9:13 AM 03/03/2022    9:28 AM 01/18/2022   10:48 AM 12/15/2021    9:28 AM 11/30/2021    9:22 AM 11/07/2021   12:18 PM 09/19/2021   10:24 AM  PHQ 2/9 Scores  PHQ - 2 Score 0 0 0 0 0 0 0  PHQ- 9 Score  0 2 2 0 2      Fall Risk    05/11/2022    9:13 AM 03/03/2022    9:28 AM 11/30/2021    9:23 AM 11/07/2021   12:18 PM 09/19/2021   10:24 AM  Fall Risk   Falls in the past year? 0 0 0 0 0  Follow up Falls evaluation completed         Objective:  Melissa Carpenter seemed alert and oriented and she participated appropriately during our telephone visit.  Blood Pressure Weight BMI  BP Readings from Last 3 Encounters:  03/03/22 (!) 152/65  03/01/22 135/66  01/18/22 (!) 155/60   Wt Readings from Last 3 Encounters:  03/03/22 133 lb (60.3 kg)  01/18/22 135 lb (61.2 kg)  12/15/21 133 lb 12.8 oz (60.7 kg)   BMI Readings from Last 1 Encounters:  03/03/22 25.13 kg/m    *Unable to obtain current vital signs, weight, and BMI due to telephone visit type  Hearing/Vision  Stanton Kidney did not seem to have difficulty with hearing/understanding during the telephone conversation Reports that she has had a formal eye exam by an eye care professional within the past year Reports that she has not had a formal hearing evaluation within the past year *Unable to fully assess hearing and vision during telephone visit type  Cognitive Function:    05/11/2022    9:09 AM  6CIT Screen  What Year? 0 points  What month? 0 points  What time? 0 points  Count back from 20 2 points  Months in reverse 0 points  Repeat phrase 0 points  Total Score 2 points   (Normal:0-7, Significant for Dysfunction: >8)  Normal Cognitive Function Screening: Yes   Immunization & Health Maintenance Record Immunization History  Administered Date(s) Administered   Fluad Quad(high Dose 65+) 05/29/2019, 05/20/2020,  05/18/2021   Influenza, High Dose Seasonal PF 05/08/2016, 06/12/2017, 05/28/2018   Influenza,inj,Quad PF,6+ Mos 05/26/2013, 05/25/2014, 06/08/2015   Moderna Sars-Covid-2 Vaccination 10/08/2019, 11/05/2019, 06/29/2020, 04/12/2021   Pneumococcal Conjugate-13 07/30/2013   Pneumococcal Polysaccharide-23 09/14/2004   Tdap 05/15/2011   Zoster Recombinat (Shingrix) 02/09/2021, 06/17/2021    Health Maintenance  Topic Date Due   TETANUS/TDAP  05/18/2022 (Originally 05/14/2021)   COVID-19 Vaccine (5 - Moderna series) 09/18/2022 (Originally 06/07/2021)   INFLUENZA VACCINE  11/12/2022 (Originally 03/14/2022)   MAMMOGRAM  02/07/2023   DEXA SCAN  12/17/2023   Pneumonia Vaccine 27+ Years old  Completed   Zoster Vaccines- Shingrix  Completed   HPV VACCINES  Aged Out       Assessment  This is a routine wellness examination for [employer].  Health  Maintenance: Due or Overdue There are no preventive care reminders to display for this patient.  Melissa Carpenter does not need a referral for [employer] Assistance: Care Management:   no Social Work:    no Prescription Assistance:  no Nutrition/Diabetes Education:  no   Plan:  Personalized Goals  Goals Addressed             This Visit's Progress    Patient Stated       05/11/2022 AWV Goal: Fall Prevention  Over the next year, patient will decrease their risk for falls by: Using assistive devices, such as a cane or walker, as needed Identifying fall risks within their home and correcting them by: Removing throw rugs Adding handrails to stairs or ramps Removing clutter and keeping a clear pathway throughout the home Increasing light, especially at night Adding shower handles/bars Raising toilet seat Identifying potential personal risk factors for falls: Medication side effects Incontinence/urgency Vestibular dysfunction Hearing loss Musculoskeletal disorders Neurological disorders Orthostatic hypotension         Personalized Health  Maintenance & Screening Recommendations  Influenza vaccine Td vaccine  Lung Cancer Screening Recommended: no (Low Dose CT Chest recommended if Age 21-80 years, 30 pack-year currently smoking OR have quit w/in past 15 years) Hepatitis C Screening recommended: no HIV Screening recommended: no  Advanced Directives: Written information was not prepared per patient's request.  Referrals & Orders No orders of the defined types were placed in this encounter.   Follow-up Plan Follow-up with [name], [name], MD as planned    I have personally reviewed and noted the following in the patient's chart:   Medical and social history Use of alcohol, tobacco or illicit drugs  Current medications and supplements Functional ability and status Nutritional status Physical activity Advanced directives List of other physicians Hospitalizations, surgeries, and ER visits in previous 12 months Vitals Screenings to include cognitive, depression, and falls Referrals and appointments  In addition, I have reviewed and discussed with Melissa Carpenter certain preventive protocols, quality metrics, and best practice recommendations. A written personalized care plan for preventive services as well as general preventive health recommendations is available and can be mailed to the patient at her request.      Felicity Coyer LPN  [id number]/50/[id number]  Patient declined after visit summary

## 2022-05-17 ENCOUNTER — Telehealth: Payer: Self-pay | Admitting: Family Medicine

## 2022-05-17 DIAGNOSIS — R7303 Prediabetes: Secondary | ICD-10-CM

## 2022-05-17 DIAGNOSIS — I10 Essential (primary) hypertension: Secondary | ICD-10-CM

## 2022-05-17 DIAGNOSIS — E785 Hyperlipidemia, unspecified: Secondary | ICD-10-CM

## 2022-05-17 NOTE — Telephone Encounter (Signed)
Patient has appt with PCP on 05/22/22 and wants to come to office tomorrow morning to do lab work.   Please add future lab order.

## 2022-05-17 NOTE — Telephone Encounter (Signed)
Lab orders placed, patient aware 

## 2022-05-18 ENCOUNTER — Other Ambulatory Visit: Payer: Medicare Other

## 2022-05-18 DIAGNOSIS — R7303 Prediabetes: Secondary | ICD-10-CM | POA: Diagnosis not present

## 2022-05-18 DIAGNOSIS — I10 Essential (primary) hypertension: Secondary | ICD-10-CM | POA: Diagnosis not present

## 2022-05-18 DIAGNOSIS — E785 Hyperlipidemia, unspecified: Secondary | ICD-10-CM

## 2022-05-18 DIAGNOSIS — R5383 Other fatigue: Secondary | ICD-10-CM | POA: Diagnosis not present

## 2022-05-18 LAB — BAYER DCA HB A1C WAIVED: HB A1C (BAYER DCA - WAIVED): 5.7 % — ABNORMAL HIGH (ref 4.8–5.6)

## 2022-05-19 LAB — CBC WITH DIFFERENTIAL/PLATELET
Basophils Absolute: 0 10*3/uL (ref 0.0–0.2)
Basos: 1 %
EOS (ABSOLUTE): 0.2 10*3/uL (ref 0.0–0.4)
Eos: 3 %
Hematocrit: 34.9 % (ref 34.0–46.6)
Hemoglobin: 11.7 g/dL (ref 11.1–15.9)
Immature Grans (Abs): 0 10*3/uL (ref 0.0–0.1)
Immature Granulocytes: 0 %
Lymphocytes Absolute: 1.5 10*3/uL (ref 0.7–3.1)
Lymphs: 30 %
MCH: 31 pg (ref 26.6–33.0)
MCHC: 33.5 g/dL (ref 31.5–35.7)
MCV: 92 fL (ref 79–97)
Monocytes Absolute: 0.6 10*3/uL (ref 0.1–0.9)
Monocytes: 11 %
Neutrophils Absolute: 2.8 10*3/uL (ref 1.4–7.0)
Neutrophils: 55 %
Platelets: 181 10*3/uL (ref 150–450)
RBC: 3.78 x10E6/uL (ref 3.77–5.28)
RDW: 13.1 % (ref 11.7–15.4)
WBC: 5 10*3/uL (ref 3.4–10.8)

## 2022-05-19 LAB — CMP14+EGFR
ALT: 22 IU/L (ref 0–32)
AST: 27 IU/L (ref 0–40)
Albumin/Globulin Ratio: 2.1 (ref 1.2–2.2)
Albumin: 4.6 g/dL (ref 3.7–4.7)
Alkaline Phosphatase: 107 IU/L (ref 44–121)
BUN/Creatinine Ratio: 21 (ref 12–28)
BUN: 13 mg/dL (ref 8–27)
Bilirubin Total: 0.4 mg/dL (ref 0.0–1.2)
CO2: 21 mmol/L (ref 20–29)
Calcium: 9.8 mg/dL (ref 8.7–10.3)
Chloride: 105 mmol/L (ref 96–106)
Creatinine, Ser: 0.63 mg/dL (ref 0.57–1.00)
Globulin, Total: 2.2 g/dL (ref 1.5–4.5)
Glucose: 102 mg/dL — ABNORMAL HIGH (ref 70–99)
Potassium: 4.1 mmol/L (ref 3.5–5.2)
Sodium: 142 mmol/L (ref 134–144)
Total Protein: 6.8 g/dL (ref 6.0–8.5)
eGFR: 86 mL/min/{1.73_m2} (ref >59)

## 2022-05-19 LAB — LIPID PANEL
Chol/HDL Ratio: 3 ratio (ref 0.0–4.4)
Cholesterol, Total: 125 mg/dL (ref 100–199)
HDL: 41 mg/dL (ref >39)
LDL Chol Calc (NIH): 57 mg/dL (ref 0–99)
Triglycerides: 159 mg/dL — ABNORMAL HIGH (ref 0–149)
VLDL Cholesterol Cal: 27 mg/dL (ref 5–40)

## 2022-05-20 LAB — TSH: TSH: 2.39 u[IU]/mL (ref 0.450–4.500)

## 2022-05-20 LAB — SPECIMEN STATUS REPORT

## 2022-05-22 ENCOUNTER — Encounter: Payer: Self-pay | Admitting: Family Medicine

## 2022-05-22 ENCOUNTER — Ambulatory Visit (INDEPENDENT_AMBULATORY_CARE_PROVIDER_SITE_OTHER): Payer: Medicare Other | Admitting: Family Medicine

## 2022-05-22 VITALS — BP 129/61 | HR 82 | Temp 97.2°F | Ht 61.0 in | Wt 135.0 lb

## 2022-05-22 DIAGNOSIS — E785 Hyperlipidemia, unspecified: Secondary | ICD-10-CM

## 2022-05-22 DIAGNOSIS — R7303 Prediabetes: Secondary | ICD-10-CM | POA: Diagnosis not present

## 2022-05-22 DIAGNOSIS — Z23 Encounter for immunization: Secondary | ICD-10-CM

## 2022-05-22 DIAGNOSIS — K449 Diaphragmatic hernia without obstruction or gangrene: Secondary | ICD-10-CM | POA: Diagnosis not present

## 2022-05-22 DIAGNOSIS — I6529 Occlusion and stenosis of unspecified carotid artery: Secondary | ICD-10-CM | POA: Diagnosis not present

## 2022-05-22 DIAGNOSIS — I10 Essential (primary) hypertension: Secondary | ICD-10-CM | POA: Diagnosis not present

## 2022-05-22 DIAGNOSIS — K219 Gastro-esophageal reflux disease without esophagitis: Secondary | ICD-10-CM | POA: Diagnosis not present

## 2022-05-22 MED ORDER — AMLODIPINE-ATORVASTATIN 10-40 MG PO TABS: 1.0000 | ORAL_TABLET | Freq: Every day | ORAL | 3 refills | Status: DC

## 2022-05-22 MED ORDER — METOPROLOL SUCCINATE ER 100 MG PO TB24: 100.0000 mg | ORAL_TABLET | Freq: Every day | ORAL | 3 refills | Status: DC

## 2022-05-22 NOTE — Progress Notes (Signed)
BP 129/61   Pulse 82   Temp (!) 97.2 F (36.2 C)   Ht '5\' 1"'  (1.549 m)   Wt 135 lb (61.2 kg)   SpO2 99%   BMI 25.51 kg/m    Subjective:   Patient ID: Melissa Carpenter, female    DOB: 11/30/1933, 88 y.o.   MRN: [id number]  HPI: Melissa Carpenter is a 88 y.o. female presenting on 05/22/2022 for Medical Management of Chronic Issues   HPI Hypertension Patient is currently on amlodipine and hydralazine and metoprolol and telmisartan, and their blood pressure today is 129/61. Patient denies any lightheadedness or dizziness. Patient denies headaches, blurred vision, chest pains, shortness of breath, or weakness. Denies any side effects from medication and is content with current medication.   Hyperlipidemia Patient is coming in for recheck of his hyperlipidemia. The patient is currently taking atorvastatin. They deny any issues with myalgias or history of liver damage from it. They deny any focal numbness or weakness or chest pain.   Prediabetes  Patient comes in today for recheck of his diabetes. Patient has been currently taking no medication currently. Patient is currently on an ACE inhibitor/ARB. Patient has not seen an ophthalmologist this year. Patient denies any issues with their feet. The symptom started onset as an adult htn and hld ARE RELATED TO DM   GERD Patient is currently on no medication.  She denies any major symptoms or abdominal pain or belching or burping. She denies any blood in her stool or lightheadedness or dizziness.   Relevant past medical, surgical, family and social history reviewed and updated as indicated. Interim medical history since our last visit reviewed. Allergies and medications reviewed and updated.  Review of Systems  Constitutional:  Negative for chills and fever.  Eyes:  Negative for visual disturbance.  Respiratory:  Negative for chest tightness and shortness of breath.   Cardiovascular:  Negative for chest pain and leg swelling.  Genitourinary:  Negative  for difficulty urinating and dysuria.  Musculoskeletal:  Negative for back pain and gait problem.  Skin:  Negative for rash.  Neurological:  Negative for light-headedness and headaches.  Psychiatric/Behavioral:  Negative for agitation and behavioral problems.   All other systems reviewed and are negative.   Per HPI unless specifically indicated above   Allergies as of 05/22/2022       Reactions   Codeine Hives   Forteo [parathyroid Hormone (recomb)] Other (See Comments)   Weakness and calcium increase   Raloxifene Other (See Comments)   Eye problems Other reaction(s): Other (See Comments) Eye problems Eye problems   Morphine Rash   Penicillins Rash   Sulfites Rash   Sulfonamide Derivatives Rash        Medication List        Accurate as of May 22, 2022 11:43 AM. If you have any questions, ask your nurse or doctor.          Accu-Chek Softclix Lancets lancets Use to check blood sugars daily   albuterol 108 (90 Base) MCG/ACT inhaler Commonly known as: VENTOLIN HFA Inhale 2 puffs into the lungs every 6 (six) hours as needed.   amLODipine-atorvastatin 10-40 MG tablet Commonly known as: CADUET Take 1 tablet by mouth daily.   aspirin 81 MG tablet Take 81 mg by mouth daily.   Calcium Carbonate-Vitamin D 600-400 MG-UNIT tablet Commonly known as: Caltrate 600+D Take 1 tablet by mouth daily.   cholecalciferol 10 MCG (400 UNIT) Tabs tablet Commonly known as: VITAMIN D3 Take 1,000  Units by mouth.   fish oil-omega-3 fatty acids 1000 MG capsule Take 1 g by mouth daily.   furosemide 20 MG tablet Commonly known as: Lasix Take 1 tablet (20 mg total) by mouth daily.   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   metoprolol succinate 100 MG 24 hr tablet Commonly known as: TOPROL-XL Take 1 tablet (100 mg total) by  mouth daily. TAKE WITH OR IMMEDIATELY FOLLOWING A MEAL.   multivitamin tablet Take 1 tablet by mouth daily.   telmisartan 80 MG tablet Commonly known as: MICARDIS TAKE 1 TABLET BY MOUTH EVERY DAY IN THE EVENING   triamcinolone cream 0.1 % Commonly known as: KENALOG APPLY TO AFFECTED AREA TWICE A DAY         Objective:   BP 129/61   Pulse 82   Temp (!) 97.2 F (36.2 C)   Ht '5\' 1"'  (1.549 m)   Wt 135 lb (61.2 kg)   SpO2 99%   BMI 25.51 kg/m   Wt Readings from Last 3 Encounters:  05/22/22 135 lb (61.2 kg)  03/03/22 133 lb (60.3 kg)  01/18/22 135 lb (61.2 kg)    Physical Exam Vitals and nursing note reviewed.  Constitutional:      General: She is not in acute distress.    Appearance: She is well-developed. She is not diaphoretic.  Eyes:     Conjunctiva/sclera: Conjunctivae normal.  Cardiovascular:     Rate and Rhythm: Normal rate and regular rhythm.     Heart sounds: Normal heart sounds. No murmur heard. Pulmonary:     Effort: Pulmonary effort is normal. No respiratory distress.     Breath sounds: Normal breath sounds. No wheezing.  Musculoskeletal:        General: No tenderness. Normal range of motion.  Skin:    General: Skin is warm and dry.     Findings: No rash.  Neurological:     Mental Status: She is alert and oriented to person, place, and time.     Coordination: Coordination normal.  Psychiatric:        Behavior: Behavior normal.     Results for orders placed or performed in visit on 05/18/22  Lipid panel  Result Value Ref Range   Cholesterol, Total 125 100 - 199 mg/dL   Triglycerides 159 (H) 0 - 149 mg/dL   HDL 41 >39 mg/dL   VLDL Cholesterol Cal 27 5 - 40 mg/dL   LDL Chol Calc (NIH) 57 0 - 99 mg/dL   Chol/HDL Ratio 3.0 0.0 - 4.4 ratio  CMP14+EGFR  Result Value Ref Range   Glucose 102 (H) 70 - 99 mg/dL   BUN 13 8 - 27 mg/dL   Creatinine, Ser 0.63 0.57 - 1.00 mg/dL   eGFR 86 >59 mL/min/1.73   BUN/Creatinine Ratio 21 12 - 28   Sodium 142  134 - 144 mmol/L   Potassium 4.1 3.5 - 5.2 mmol/L   Chloride 105 96 - 106 mmol/L   CO2 21 20 - 29 mmol/L   Calcium 9.8 8.7 - 10.3 mg/dL   Total Protein 6.8 6.0 - 8.5 g/dL   Albumin 4.6 3.7 - 4.7 g/dL   Globulin, Total 2.2 1.5 - 4.5 g/dL   Albumin/Globulin Ratio 2.1 1.2 - 2.2   Bilirubin Total 0.4 0.0 - 1.2 mg/dL   Alkaline  Phosphatase 107 44 - 121 IU/L   AST 27 0 - 40 IU/L   ALT 22 0 - 32 IU/L  CBC with Differential/Platelet  Result Value Ref Range   WBC 5.0 3.4 - 10.8 x10E3/uL   RBC 3.78 3.77 - 5.28 x10E6/uL   Hemoglobin 11.7 11.1 - 15.9 g/dL   Hematocrit 34.9 34.0 - 46.6 %   MCV 92 79 - 97 fL   MCH 31.0 26.6 - 33.0 pg   MCHC 33.5 31.5 - 35.7 g/dL   RDW 13.1 11.7 - 15.4 %   Platelets 181 150 - 450 x10E3/uL   Neutrophils 55 Not Estab. %   Lymphs 30 Not Estab. %   Monocytes 11 Not Estab. %   Eos 3 Not Estab. %   Basos 1 Not Estab. %   Neutrophils Absolute 2.8 1.4 - 7.0 x10E3/uL   Lymphocytes Absolute 1.5 0.7 - 3.1 x10E3/uL   Monocytes Absolute 0.6 0.1 - 0.9 x10E3/uL   EOS (ABSOLUTE) 0.2 0.0 - 0.4 x10E3/uL   Basophils Absolute 0.0 0.0 - 0.2 x10E3/uL   Immature Granulocytes 0 Not Estab. %   Immature Grans (Abs) 0.0 0.0 - 0.1 x10E3/uL  Bayer DCA Hb A1c Waived  Result Value Ref Range   HB A1C (BAYER DCA - WAIVED) 5.7 (H) 4.8 - 5.6 %  TSH  Result Value Ref Range   TSH 2.390 0.450 - 4.500 uIU/mL  Specimen status report  Result Value Ref Range   specimen status report Comment     Assessment & Plan:   Problem List Items Addressed This Visit       Cardiovascular and Mediastinum   Essential hypertension - Primary (Chronic)   Relevant Medications   amLODipine-atorvastatin (CADUET) 10-40 MG tablet   metoprolol succinate (TOPROL-XL) 100 MG 24 hr tablet     Respiratory   Gastroesophageal reflux disease with hiatal hernia     Other   Dyslipidemia (Chronic)   Relevant Medications   amLODipine-atorvastatin (CADUET) 10-40 MG tablet   Pre-diabetes    Continue current  medication, blood pressure looks good.  Patient's blood work looks great.  No changes. Follow up plan: Return if symptoms worsen or fail to improve.  Counseling provided for all of the vaccine components No orders of the defined types were placed in this encounter.   Caryl Pina, MD [facility] 05/22/2022, 11:43 AM

## 2022-06-07 ENCOUNTER — Encounter: Payer: Self-pay | Admitting: Nurse Practitioner

## 2022-06-07 ENCOUNTER — Ambulatory Visit (INDEPENDENT_AMBULATORY_CARE_PROVIDER_SITE_OTHER): Payer: Medicare Other | Admitting: Nurse Practitioner

## 2022-06-07 DIAGNOSIS — J069 Acute upper respiratory infection, unspecified: Secondary | ICD-10-CM

## 2022-06-07 MED ORDER — FLUTICASONE PROPIONATE 50 MCG/ACT NA SUSP: 2.0000 | Freq: Every day | NASAL | 6 refills | Status: DC

## 2022-06-07 MED ORDER — DOXYCYCLINE HYCLATE 100 MG PO TABS: 100.0000 mg | ORAL_TABLET | Freq: Two times a day (BID) | ORAL | 0 refills | Status: DC

## 2022-06-07 MED ORDER — GUAIFENESIN ER 600 MG PO TB12: 600.0000 mg | ORAL_TABLET | Freq: Two times a day (BID) | ORAL | 0 refills | Status: DC

## 2022-06-07 MED ORDER — BENZONATATE 100 MG PO CAPS: 100.0000 mg | ORAL_CAPSULE | Freq: Three times a day (TID) | ORAL | 0 refills | Status: DC | PRN

## 2022-06-07 NOTE — Progress Notes (Signed)
    Virtual Visit  Note Due to COVID-19 pandemic this visit was conducted virtually. This visit type was conducted due to national recommendations for restrictions regarding the COVID-19 Pandemic (e.g. social distancing, sheltering in place) in an effort to limit this patient's exposure and mitigate transmission in our community. All issues noted in this document were discussed and addressed.  A physical exam was not performed with this format.  I connected with Melissa Carpenter on 06/07/22 at 11;08 am  by telephone and verified that I am speaking with the correct person using two identifiers. Melissa Carpenter is currently located at home during visit. The provider, Ivy Lynn, NP is located in their office at time of visit.  I discussed the limitations, risks, security and privacy concerns of performing an evaluation and management service by telephone and the availability of in person appointments. I also discussed with the patient that there may be a patient responsible charge related to this service. The patient expressed understanding and agreed to proceed.   History and Present Illness:  URI  This is a new problem. There has been no fever. Associated symptoms include congestion and coughing. Pertinent negatives include no chest pain, joint swelling, rash, sinus pain or sore throat. She has tried decongestant and acetaminophen for the symptoms. The treatment provided no relief.      Review of Systems  Constitutional:  Positive for chills and malaise/fatigue. Negative for fever.  HENT:  Positive for congestion. Negative for sinus pain and sore throat.   Respiratory:  Positive for cough.   Cardiovascular: Negative.  Negative for chest pain.  Skin: Negative.  Negative for rash.  All other systems reviewed and are negative.    Observations/Objective: Televisit patient not in distress  Assessment and Plan: Take meds as prescribed - Use a cool mist humidifier  -Use saline nose sprays  frequently -Force fluids -For fever or aches or pains- take Tylenol or ibuprofen. -If symptoms do not improve, she may need to be COVID tested to rule this out Follow up with worsening unresolved symptoms   Follow Up Instructions: Follow-up with unresolved symptoms    I discussed the assessment and treatment plan with the patient. The patient was provided an opportunity to ask questions and all were answered. The patient agreed with the plan and demonstrated an understanding of the instructions.   The patient was advised to call back or seek an in-person evaluation if the symptoms worsen or if the condition fails to improve as anticipated.  The above assessment and management plan was discussed with the patient. The patient verbalized understanding of and has agreed to the management plan. Patient is aware to call the clinic if symptoms persist or worsen. Patient is aware when to return to the clinic for a follow-up visit. Patient educated on when it is appropriate to go to the emergency department.   Time call ended:  11:19 am   I provided 11 minutes of  non face-to-face time during this encounter.    Ivy Lynn, NP

## 2022-06-08 LAB — COVID-19, FLU A+B AND RSV
Influenza A, NAA: NOT DETECTED
Influenza B, NAA: NOT DETECTED
RSV, NAA: NOT DETECTED
SARS-CoV-2, NAA: NOT DETECTED

## 2022-06-27 ENCOUNTER — Ambulatory Visit (INDEPENDENT_AMBULATORY_CARE_PROVIDER_SITE_OTHER): Payer: Medicare Other | Admitting: Family Medicine

## 2022-06-27 ENCOUNTER — Encounter: Payer: Self-pay | Admitting: Family Medicine

## 2022-06-27 VITALS — BP 142/53 | HR 64 | Temp 97.0°F | Ht 61.0 in | Wt 135.4 lb

## 2022-06-27 DIAGNOSIS — J40 Bronchitis, not specified as acute or chronic: Secondary | ICD-10-CM | POA: Diagnosis not present

## 2022-06-27 MED ORDER — PREDNISONE 20 MG PO TABS: ORAL_TABLET | ORAL | 0 refills | Status: DC

## 2022-06-27 MED ORDER — ALBUTEROL SULFATE HFA 108 (90 BASE) MCG/ACT IN AERS: 2.0000 | INHALATION_SPRAY | Freq: Four times a day (QID) | RESPIRATORY_TRACT | 2 refills | Status: DC | PRN

## 2022-06-27 NOTE — Progress Notes (Signed)
Subjective:  Patient ID: Melissa Carpenter, female    DOB: Jan 02, 1934, 88 y.o.   MRN: [id number]  Patient Care Team: [name], [name], MD as PCP - General (Family Medicine) Paralee Cancel, MD as Consulting Physician (Orthopedic Surgery) Minus Breeding, MD as Consulting Physician (Cardiology) Angelia Mould, MD as Consulting Physician (Vascular Surgery)   Chief Complaint:  Cough (Patient had visit on 10/25 and states she is just better just still has cough )   HPI: Melissa Carpenter is a 88 y.o. female presenting on 06/27/2022 for Cough (Patient had visit on 10/25 and states she is just better just still has cough )   Pt presents today with ongoing cough. She was seen and treat with doxycycline, flonase, tessalon, and Mucinex for URI on 06/07/2022. States she feels better but has a continued cough. No shortness of breath, fever, chills, weakness, malaise, confusion, or decreased urine output. Cough is nonproductive.   Cough This is a recurrent problem. Episode onset: 06/07/2022. The problem has been waxing and waning. The problem occurs every few minutes. The cough is Non-productive. Pertinent negatives include no chest pain, chills, ear congestion, ear pain, fever, headaches, heartburn, hemoptysis, myalgias, nasal congestion, postnasal drip, rash, rhinorrhea, sore throat, shortness of breath, sweats, weight loss or wheezing. Nothing aggravates the symptoms.       Relevant past medical, surgical, family, and social history reviewed and updated as indicated.  Allergies and medications reviewed and updated. Data reviewed: Chart in Epic.   Past Medical History:  Diagnosis Date   Allergy    Aortic insufficiency    Mild, echo, December, 2009   Arthritis    Carotid artery disease ([place])    Cataract    Cellulitis    Chest pain    Catheterization, 2003, no significant CAD   Diastolic dysfunction    Mild diastolic dysfunction, echo, December, 2012   DJD (degenerative joint disease)     Dyslipidemia    Edema    November, 2012   GERD (gastroesophageal reflux disease)    Hyperlipidemia    Hypertension    Mitral valve prolapse    Echo, 2009, very mild intermittent prolapse of the posterior leaflet, no MR   Osteoporosis    Prediabetes    Tachycardia    Nighttime tachycardia, February, 2014   Thyroid nodule     Past Surgical History:  Procedure Laterality Date   [place]   BIOPSY THYROID Left    CARDIAC CATHETERIZATION  sept 2003   no significant cad   CATARACT EXTRACTION, BILATERAL     COLONOSCOPY     NASAL SINUS SURGERY     TONSILLECTOMY      Social History   Socioeconomic History   Marital status: Married    Spouse name: Thersa Salt Advertising account planner)   Number of children: 4   Years of education: Not on file   Highest education level: Not on file  Occupational History   Occupation: retired    Fish farm manager: [employer]  Tobacco Use   Smoking status: Never   Smokeless tobacco: Never  Vaping Use   Vaping Use: Never used  Substance and Sexual Activity   Alcohol use: No    Alcohol/week: 0.0 standard drinks of alcohol   Drug use: No   Sexual activity: Never  Other Topics Concern   Not on file  Social History Narrative   Lives in 2 story home with her husband   Children live nearby  Social Determinants of Health   Financial Resource Strain: Low Risk  (05/10/2021)   Overall Financial Resource Strain (CARDIA)    Difficulty of Paying Living Expenses: Not very hard  Food Insecurity: No Food Insecurity (05/10/2021)   Hunger Vital Sign    Worried About Running Out of Food in the Last Year: Never true    Ran Out of Food in the Last Year: Never true  Transportation Needs: No Transportation Needs (05/10/2021)   PRAPARE - Hydrologist (Medical): No    Lack of Transportation (Non-Medical): No  Physical Activity: Insufficiently Active (05/10/2021)   Exercise Vital Sign    Days of Exercise per  Week: 7 days    Minutes of Exercise per Session: 10 min  Stress: No Stress Concern Present (05/10/2021)   [place]    Feeling of Stress : Not at all  Social Connections: Moderately Isolated (05/10/2021)   Social Connection and Isolation Panel [NHANES]    Frequency of Communication with Friends and Family: More than three times a week    Frequency of Social Gatherings with Friends and Family: More than three times a week    Attends Religious Services: Never    Marine scientist or Organizations: No    Attends Archivist Meetings: Never    Marital Status: Married  Human resources officer Violence: Not At Risk (05/10/2021)   Humiliation, Afraid, Rape, and Kick questionnaire    Fear of Current or Ex-Partner: No    Emotionally Abused: No    Physically Abused: No    Sexually Abused: No    Outpatient Encounter Medications as of 06/27/2022  Medication Sig   Accu-Chek Softclix Lancets lancets Use to check blood sugars daily   albuterol (VENTOLIN HFA) 108 (90 Base) MCG/ACT inhaler Inhale 2 puffs into the lungs every 6 (six) hours as needed for wheezing or shortness of breath.   amLODipine-atorvastatin (CADUET) 10-40 MG tablet Take 1 tablet by mouth daily.   aspirin 81 MG tablet Take 81 mg by mouth daily.   benzonatate (TESSALON PERLES) 100 MG capsule Take 1 capsule (100 mg total) by mouth 3 (three) times daily as needed.   Calcium Carbonate-Vitamin D (CALTRATE 600+D) 600-400 MG-UNIT per tablet Take 1 tablet by mouth daily.   cholecalciferol (VITAMIN D) 400 UNITS TABS Take 1,000 Units by mouth.   fish oil-omega-3 fatty acids 1000 MG capsule Take 1 g by mouth daily.   fluticasone (FLONASE) 50 MCG/ACT nasal spray Place 2 sprays into both nostrils daily.   glucose blood test strip Check blood glucose QD and PRN DxE11.9   guaiFENesin (MUCINEX) 600 MG 12 hr tablet Take 1 tablet (600 mg total) by mouth 2 (two) times daily.    hydrALAZINE (APRESOLINE) 25 MG tablet Take 1 tablet (25 mg total) by mouth in the morning and at bedtime.   metoprolol succinate (TOPROL-XL) 100 MG 24 hr tablet Take 1 tablet (100 mg total) by mouth daily. TAKE WITH OR IMMEDIATELY FOLLOWING A MEAL.   Multiple Vitamin (MULTIVITAMIN) tablet Take 1 tablet by mouth daily.   predniSONE (DELTASONE) 20 MG tablet 2 po at sametime daily for 5 days- start tomorrow   telmisartan (MICARDIS) 80 MG tablet TAKE 1 TABLET BY MOUTH EVERY DAY IN THE EVENING   triamcinolone cream (KENALOG) 0.1 % APPLY TO AFFECTED AREA TWICE A DAY   [DISCONTINUED] albuterol (VENTOLIN HFA) 108 (90 Base) MCG/ACT inhaler Inhale 2 puffs into the lungs every  6 (six) hours as needed.   furosemide (LASIX) 20 MG tablet Take 1 tablet (20 mg total) by mouth daily. (Patient not taking: Reported on 06/27/2022)   [DISCONTINUED] doxycycline (VIBRA-TABS) 100 MG tablet Take 1 tablet (100 mg total) by mouth 2 (two) times daily.   No facility-administered encounter medications on file as of 06/27/2022.    Allergies  Allergen Reactions   Codeine Hives   Forteo [Parathyroid Hormone (Recomb)] Other (See Comments)    Weakness and calcium increase   Raloxifene Other (See Comments)    Eye problems Other reaction(s): Other (See Comments) Eye problems Eye problems   Morphine Rash   Penicillins Rash   Sulfites Rash   Sulfonamide Derivatives Rash    Review of Systems  Constitutional:  Negative for activity change, appetite change, chills, diaphoresis, fatigue, fever, unexpected weight change and weight loss.  HENT:  Negative for congestion, ear pain, postnasal drip, rhinorrhea and sore throat.   Respiratory:  Positive for cough. Negative for apnea, hemoptysis, choking, chest tightness, shortness of breath, wheezing and stridor.   Cardiovascular:  Negative for chest pain, palpitations and leg swelling.  Gastrointestinal:  Negative for abdominal pain and heartburn.  Genitourinary:  Negative for  decreased urine volume and difficulty urinating.  Musculoskeletal:  Positive for arthralgias (chronic). Negative for myalgias.  Skin:  Negative for rash.  Neurological:  Negative for weakness and headaches.  Psychiatric/Behavioral:  Negative for confusion.   All other systems reviewed and are negative.       Objective:  BP (!) 142/53   Pulse 64   Temp (!) 97 F (36.1 C) (Temporal)   Ht '5\' 1"'$  (1.549 m)   Wt 135 lb 6.4 oz (61.4 kg)   SpO2 97%   BMI 25.58 kg/m    Wt Readings from Last 3 Encounters:  06/27/22 135 lb 6.4 oz (61.4 kg)  05/22/22 135 lb (61.2 kg)  03/03/22 133 lb (60.3 kg)    Physical Exam Vitals and nursing note reviewed.  Constitutional:      General: She is not in acute distress.    Appearance: Normal appearance. She is obese. She is not ill-appearing, toxic-appearing or diaphoretic.  HENT:     Head: Normocephalic and atraumatic.     Right Ear: Tympanic membrane, ear canal and external ear normal.     Left Ear: Tympanic membrane, ear canal and external ear normal.     Nose: Nose normal.     Mouth/Throat:     Mouth: Mucous membranes are moist.     Pharynx: Oropharynx is clear.  Eyes:     Conjunctiva/sclera: Conjunctivae normal.     Pupils: Pupils are equal, round, and reactive to light.  Neck:     Vascular: Carotid bruit present.     Trachea: Trachea and phonation normal.  Cardiovascular:     Rate and Rhythm: Normal rate. No extrasystoles are present.    Heart sounds: Murmur heard.     Systolic (loudest at RUSB, radiates to carotid) murmur is present with a grade of 3/6.  Pulmonary:     Effort: Pulmonary effort is normal. No respiratory distress.     Breath sounds: No stridor. Wheezing (minimal, scattered) present. No rhonchi or rales.  Musculoskeletal:     Right lower leg: No edema.     Left lower leg: No edema.  Skin:    General: Skin is warm and dry.     Capillary Refill: Capillary refill takes less than 2 seconds.  Neurological:  General:  No focal deficit present.     Mental Status: She is alert and oriented to person, place, and time.     Gait: Gait abnormal (slow, antalgic).  Psychiatric:        Mood and Affect: Mood normal.        Behavior: Behavior normal.        Thought Content: Thought content normal.        Judgment: Judgment normal.     Results for orders placed or performed in visit on 06/07/22  COVID-19, Flu A+B and RSV   Specimen: Nasopharyngeal(NP) swabs in vial transport medium  Result Value Ref Range   SARS-CoV-2, NAA Not Detected Not Detected   Influenza A, NAA Not Detected Not Detected   Influenza B, NAA Not Detected Not Detected   RSV, NAA Not Detected Not Detected   Test Information: Comment        Pertinent labs & imaging results that were available during my care of the patient were reviewed by me and considered in my medical decision making.  Assessment & Plan:  Melissa Carpenter was seen today for cough.  Diagnoses and all orders for this visit:  Bronchitis Ongoing cough post URI. Bronchitis discussed in detail with pt. Will start prednisone and albuterol as prescribed. Pt aware to report new, worsening, or persistent symptoms.  -     albuterol (VENTOLIN HFA) 108 (90 Base) MCG/ACT inhaler; Inhale 2 puffs into the lungs every 6 (six) hours as needed for wheezing or shortness of breath. -     predniSONE (DELTASONE) 20 MG tablet; 2 po at sametime daily for 5 days- start tomorrow     Continue all other maintenance medications.  Follow up plan: Return if symptoms worsen or fail to improve.   Continue healthy lifestyle choices, including diet (rich in fruits, vegetables, and lean proteins, and low in salt and simple carbohydrates) and exercise (at least 30 minutes of moderate physical activity daily).  Educational handout given for bronchitis   The above assessment and management plan was discussed with the patient. The patient verbalized understanding of and has agreed to the management plan. Patient  is aware to call the clinic if they develop any new symptoms or if symptoms persist or worsen. Patient is aware when to return to the clinic for a follow-up visit. Patient educated on when it is appropriate to go to the emergency department.   Monia Pouch, FNP-C [facility] [phone]

## 2022-07-12 DIAGNOSIS — M25562 Pain in left knee: Secondary | ICD-10-CM | POA: Diagnosis not present

## 2022-08-18 ENCOUNTER — Ambulatory Visit (HOSPITAL_COMMUNITY): Payer: Medicare Other | Attending: Cardiology

## 2022-08-18 DIAGNOSIS — I350 Nonrheumatic aortic (valve) stenosis: Secondary | ICD-10-CM | POA: Diagnosis not present

## 2022-08-18 LAB — ECHOCARDIOGRAM COMPLETE
AR max vel: 1.3 cm2
AV Area VTI: 1.27 cm2
AV Area mean vel: 1.24 cm2
AV Mean grad: 36.5 mmHg
AV Peak grad: 66.3 mmHg
Ao pk vel: 4.07 m/s
Area-P 1/2: 3.39 cm2
S' Lateral: 2.1 cm

## 2022-08-19 NOTE — Progress Notes (Unsigned)
Cardiology Office Note   Date:  08/19/2022   ID:  Melissa Carpenter, DOB [birth year], MRN [id number]  PCP:  [name], [name], MD  Cardiologist:   Minus Breeding, MD   No chief complaint on file.     History of Present Illness: Melissa Carpenter is a 89 y.o. female who presents for follow up of palpitations and HTN.  Since I last saw her ***  *** she has continued to have some palpitations.  They are not particularly bothersome to her however.  She notices them occasionally.  She had some the other day and she noticed her blood pressure was 180 at that time been back down to systolic 109.  She said her blood pressure is really never going down below 323 systolic or rarely.  She is not having any shortness of breath, orthopnea.  She denies any presyncope or syncope.  She does relate household chores.  Takes care of her husband who has chronic medical illnesses.    Past Medical History:  Diagnosis Date   Allergy    Aortic insufficiency    Mild, echo, December, 2009   Arthritis    Carotid artery disease ([place])    Cataract    Cellulitis    Chest pain    Catheterization, 2003, no significant CAD   Diastolic dysfunction    Mild diastolic dysfunction, echo, December, 2012   DJD (degenerative joint disease)    Dyslipidemia    Edema    November, 2012   GERD (gastroesophageal reflux disease)    Hyperlipidemia    Hypertension    Mitral valve prolapse    Echo, 2009, very mild intermittent prolapse of the posterior leaflet, no MR   Osteoporosis    Prediabetes    Tachycardia    Nighttime tachycardia, February, 2014   Thyroid nodule     Past Surgical History:  Procedure Laterality Date   [place]   BIOPSY THYROID Left    CARDIAC CATHETERIZATION  sept 2003   no significant cad   CATARACT EXTRACTION, BILATERAL     COLONOSCOPY     NASAL SINUS SURGERY     TONSILLECTOMY       Current Outpatient Medications  Medication Sig Dispense Refill    Accu-Chek Softclix Lancets lancets Use to check blood sugars daily 100 each 3   albuterol (VENTOLIN HFA) 108 (90 Base) MCG/ACT inhaler Inhale 2 puffs into the lungs every 6 (six) hours as needed for wheezing or shortness of breath. 8 g 2   amLODipine-atorvastatin (CADUET) 10-40 MG tablet Take 1 tablet by mouth daily. 90 tablet 3   aspirin 81 MG tablet Take 81 mg by mouth daily.     benzonatate (TESSALON PERLES) 100 MG capsule Take 1 capsule (100 mg total) by mouth 3 (three) times daily as needed. 20 capsule 0   Calcium Carbonate-Vitamin D (CALTRATE 600+D) 600-400 MG-UNIT per tablet Take 1 tablet by mouth daily.     cholecalciferol (VITAMIN D) 400 UNITS TABS Take 1,000 Units by mouth.     fish oil-omega-3 fatty acids 1000 MG capsule Take 1 g by mouth daily.     fluticasone (FLONASE) 50 MCG/ACT nasal spray Place 2 sprays into both nostrils daily. 16 g 6   furosemide (LASIX) 20 MG tablet Take 1 tablet (20 mg total) by mouth daily. (Patient not taking: Reported on 06/27/2022) 30 tablet 2   glucose blood test strip Check blood glucose QD and PRN DxE11.9  100 each 12   guaiFENesin (MUCINEX) 600 MG 12 hr tablet Take 1 tablet (600 mg total) by mouth 2 (two) times daily. 30 tablet 0   hydrALAZINE (APRESOLINE) 25 MG tablet Take 1 tablet (25 mg total) by mouth in the morning and at bedtime. 180 tablet 3   metoprolol succinate (TOPROL-XL) 100 MG 24 hr tablet Take 1 tablet (100 mg total) by mouth daily. TAKE WITH OR IMMEDIATELY FOLLOWING A MEAL. 90 tablet 3   Multiple Vitamin (MULTIVITAMIN) tablet Take 1 tablet by mouth daily.     predniSONE (DELTASONE) 20 MG tablet 2 po at sametime daily for 5 days- start tomorrow 10 tablet 0   telmisartan (MICARDIS) 80 MG tablet TAKE 1 TABLET BY MOUTH EVERY DAY IN THE EVENING 90 tablet 3   triamcinolone cream (KENALOG) 0.1 % APPLY TO AFFECTED AREA TWICE A DAY 80 g 3   No current facility-administered medications for this visit.    Allergies:   Codeine, Forteo [parathyroid  hormone (recomb)], Raloxifene, Morphine, Penicillins, Sulfites, and Sulfonamide derivatives    ROS:  Please see the history of present illness.   Otherwise, review of systems are positive for ***.  All other systems are reviewed and negative.    PHYSICAL EXAM: VS:  There were no vitals taken for this visit. , BMI There is no height or weight on file to calculate BMI.  GENERAL:  Well appearing NECK:  No jugular venous distention, waveform within normal limits, carotid upstroke brisk and symmetric, no bruits, no thyromegaly LUNGS:  Clear to auscultation bilaterally CHEST:  Unremarkable HEART:  PMI not displaced or sustained,S1 and S2 within normal limits, no S3, no S4, no clicks, no rubs, *** murmurs ABD:  Flat, positive bowel sounds normal in frequency in pitch, no bruits, no rebound, no guarding, no midline pulsatile mass, no hepatomegaly, no splenomegaly EXT:  2 plus pulses throughout, no edema, no cyanosis no clubbing    ***GENERAL:  Well appearing NECK:  No jugular venous distention, waveform within normal limits, carotid upstroke brisk and symmetric, no bruits, no thyromegaly LUNGS:  Clear to auscultation bilaterally CHEST:  Unremarkable HEART:  PMI not displaced or sustained,S1 and S2 within normal limits, no S3, no S4, no clicks, no rubs, 3 out of 6 apical systolic murmur radiating slightly at aortic outflow tract, no diastolic murmurs.   ABD:  Flat, positive bowel sounds normal in frequency in pitch, no bruits, no rebound, no guarding, no midline pulsatile mass, no hepatomegaly, no splenomegaly EXT:  2 plus pulses throughout, no edema, no cyanosis no clubbing   EKG:  EKG is *** ordered today Sinus rhythm, rate ***, axis within normal limits, intervals within normal limits, no acute ST-T wave changes.   Recent Labs: 11/21/2021: B Natriuretic Peptide 244.0 05/18/2022: ALT 22; BUN 13; Creatinine, Ser 0.63; Hemoglobin 11.7; Platelets 181; Potassium 4.1; Sodium 142; TSH 2.390     Lipid Panel    Component Value Date/Time   CHOL 125 05/18/2022 0812   CHOL 165 01/13/2013 0833   TRIG 159 (H) 05/18/2022 0812   TRIG 198 (H) 03/01/2017 1706   TRIG 240 (H) 01/13/2013 0833   HDL 41 05/18/2022 0812   HDL 38 (L) 03/01/2017 1706   HDL 39 (L) 01/13/2013 0833   CHOLHDL 3.0 05/18/2022 0812   LDLCALC 57 05/18/2022 0812   LDLCALC 83 03/18/2014 0819   LDLCALC 78 01/13/2013 0833      Wt Readings from Last 3 Encounters:  06/27/22 135 lb 6.4 oz (61.4 kg)  05/22/22 135 lb (61.2 kg)  03/03/22 133 lb (60.3 kg)      Other studies Reviewed: Additional studies/ records that were reviewed today include:  *** Review of the above records demonstrates:  ***   ASSESSMENT AND PLAN:  HTN:   Her blood pressures is *** elevated.  I am going to increase her hydralazine to 25 mg twice daily.   PALPITATIONS:   *** These are not particularly problematic so no change in therapy.  CAROTID STENOSIS:   This  mild previously.  ***   AS:   This was moderate on echo earlier this month. ***  Current medicines are reviewed at length with the patient today.  The patient does not have concerns regarding medicines.  The following changes have been made:   ***  Labs/ tests ordered today include:   ***  No orders of the defined types were placed in this encounter.    Disposition:   FU with me in  in *** months. Ronnell Gu[place], MD  08/19/2022 9:00 PM    [place]

## 2022-08-21 ENCOUNTER — Telehealth: Payer: Self-pay | Admitting: Cardiology

## 2022-08-21 NOTE — Telephone Encounter (Signed)
Minus Breeding, MD 08/19/2022  5:53 PM EST     Moderate aortic valve stenosis.  No change in therapy.  I will continue to follow this clinically.  Call Ms. Castilla with the results and send results to [name], [name], MD    Spoke with patient regarding the following results. Patient made aware and patient verbalized understanding.

## 2022-08-21 NOTE — Telephone Encounter (Signed)
Patient is returning RN's call for echo results. Please advise.

## 2022-08-23 ENCOUNTER — Ambulatory Visit (INDEPENDENT_AMBULATORY_CARE_PROVIDER_SITE_OTHER): Payer: Medicare Other | Admitting: Cardiology

## 2022-08-23 ENCOUNTER — Encounter: Payer: Self-pay | Admitting: Cardiology

## 2022-08-23 VITALS — BP 140/68 | HR 62 | Ht 60.0 in | Wt 136.0 lb

## 2022-08-23 DIAGNOSIS — R002 Palpitations: Secondary | ICD-10-CM

## 2022-08-23 DIAGNOSIS — I10 Essential (primary) hypertension: Secondary | ICD-10-CM

## 2022-08-23 DIAGNOSIS — I350 Nonrheumatic aortic (valve) stenosis: Secondary | ICD-10-CM

## 2022-08-23 MED ORDER — HYDRALAZINE HCL 25 MG PO TABS: 25.0000 mg | ORAL_TABLET | Freq: Three times a day (TID) | ORAL | 3 refills | Status: DC

## 2022-08-23 NOTE — Patient Instructions (Signed)
Medication Instructions:  Please increase Hydralazine to 25 mg three times a day. Continue all other medications as listed.  *If you need a refill on your cardiac medications before your next appointment, please call your pharmacy*  Testing/Procedures: Your physician has requested that you have an echocardiogram in 1 year. Echocardiography is a painless test that uses sound waves to create images of your heart. It provides your doctor with information about the size and shape of your heart and how well your heart's chambers and valves are working. This procedure takes approximately one hour. There are no restrictions for this procedure. Please do NOT wear cologne, perfume, aftershave, or lotions (deodorant is allowed). Please arrive 15 minutes prior to your appointment time. You will be contacted at a later date to be scheduled for this at [facility].  Follow-Up: At [facility], you and your health needs are our priority.  As part of our continuing mission to provide you with exceptional heart care, we have created designated Provider Care Teams.  These Care Teams include your primary Cardiologist (physician) and Advanced Practice Providers (APPs -  Physician Assistants and Nurse Practitioners) who all work together to provide you with the care you need, when you need it.  We recommend signing up for the patient portal called "MyChart".  Sign up information is provided on this After Visit Summary.  MyChart is used to connect with patients for Virtual Visits (Telemedicine).  Patients are able to view lab/test results, encounter notes, upcoming appointments, etc.  Non-urgent messages can be sent to your provider as well.   To learn more about what you can do with MyChart, go to [email or web address].    Your next appointment:   1 year(s)  The format for your next appointment:   In Person  Provider:   Minus Breeding, MD     Important Information About Sugar

## 2022-09-07 ENCOUNTER — Other Ambulatory Visit (HOSPITAL_COMMUNITY): Payer: Medicare Other

## 2022-09-19 ENCOUNTER — Telehealth: Payer: Self-pay | Admitting: Family Medicine

## 2022-09-19 ENCOUNTER — Other Ambulatory Visit: Payer: Self-pay | Admitting: Family Medicine

## 2022-09-19 DIAGNOSIS — R7303 Prediabetes: Secondary | ICD-10-CM

## 2022-09-19 DIAGNOSIS — E785 Hyperlipidemia, unspecified: Secondary | ICD-10-CM

## 2022-09-19 NOTE — Telephone Encounter (Signed)
ORDERS IN

## 2022-09-20 ENCOUNTER — Other Ambulatory Visit: Payer: Medicare Other

## 2022-09-20 DIAGNOSIS — R7303 Prediabetes: Secondary | ICD-10-CM

## 2022-09-20 DIAGNOSIS — E785 Hyperlipidemia, unspecified: Secondary | ICD-10-CM

## 2022-09-20 LAB — LIPID PANEL

## 2022-09-20 LAB — BAYER DCA HB A1C WAIVED: HB A1C (BAYER DCA - WAIVED): 6.3 % — ABNORMAL HIGH (ref 4.8–5.6)

## 2022-09-21 LAB — CMP14+EGFR
ALT: 41 IU/L — ABNORMAL HIGH (ref 0–32)
AST: 41 IU/L — ABNORMAL HIGH (ref 0–40)
Albumin/Globulin Ratio: 2.1 (ref 1.2–2.2)
Albumin: 4.8 g/dL — ABNORMAL HIGH (ref 3.7–4.7)
Alkaline Phosphatase: 114 IU/L (ref 44–121)
BUN/Creatinine Ratio: 20 (ref 12–28)
BUN: 12 mg/dL (ref 8–27)
Bilirubin Total: 0.4 mg/dL (ref 0.0–1.2)
CO2: 22 mmol/L (ref 20–29)
Calcium: 9.7 mg/dL (ref 8.7–10.3)
Chloride: 106 mmol/L (ref 96–106)
Creatinine, Ser: 0.61 mg/dL (ref 0.57–1.00)
Globulin, Total: 2.3 g/dL (ref 1.5–4.5)
Glucose: 109 mg/dL — ABNORMAL HIGH (ref 70–99)
Potassium: 4.2 mmol/L (ref 3.5–5.2)
Sodium: 144 mmol/L (ref 134–144)
Total Protein: 7.1 g/dL (ref 6.0–8.5)
eGFR: 87 mL/min/{1.73_m2} (ref >59)

## 2022-09-21 LAB — CBC WITH DIFFERENTIAL/PLATELET
Basophils Absolute: 0.1 10*3/uL (ref 0.0–0.2)
Basos: 1 %
EOS (ABSOLUTE): 0.2 10*3/uL (ref 0.0–0.4)
Eos: 5 %
Hematocrit: 39 % (ref 34.0–46.6)
Hemoglobin: 13.3 g/dL (ref 11.1–15.9)
Immature Grans (Abs): 0 10*3/uL (ref 0.0–0.1)
Immature Granulocytes: 0 %
Lymphocytes Absolute: 1.4 10*3/uL (ref 0.7–3.1)
Lymphs: 30 %
MCH: 31.5 pg (ref 26.6–33.0)
MCHC: 34.1 g/dL (ref 31.5–35.7)
MCV: 92 fL (ref 79–97)
Monocytes Absolute: 0.5 10*3/uL (ref 0.1–0.9)
Monocytes: 11 %
Neutrophils Absolute: 2.6 10*3/uL (ref 1.4–7.0)
Neutrophils: 53 %
Platelets: 198 10*3/uL (ref 150–450)
RBC: 4.22 x10E6/uL (ref 3.77–5.28)
RDW: 12.8 % (ref 11.7–15.4)
WBC: 4.8 10*3/uL (ref 3.4–10.8)

## 2022-09-21 LAB — LIPID PANEL
Chol/HDL Ratio: 2.9 ratio (ref 0.0–4.4)
Cholesterol, Total: 142 mg/dL (ref 100–199)
HDL: 49 mg/dL (ref >39)
LDL Chol Calc (NIH): 67 mg/dL (ref 0–99)
Triglycerides: 150 mg/dL — ABNORMAL HIGH (ref 0–149)
VLDL Cholesterol Cal: 26 mg/dL (ref 5–40)

## 2022-09-22 ENCOUNTER — Encounter: Payer: Self-pay | Admitting: Family Medicine

## 2022-09-22 ENCOUNTER — Ambulatory Visit (INDEPENDENT_AMBULATORY_CARE_PROVIDER_SITE_OTHER): Payer: Medicare Other | Admitting: Family Medicine

## 2022-09-22 VITALS — BP 132/48 | HR 64 | Ht 60.0 in | Wt 135.0 lb

## 2022-09-22 DIAGNOSIS — E785 Hyperlipidemia, unspecified: Secondary | ICD-10-CM

## 2022-09-22 DIAGNOSIS — R7303 Prediabetes: Secondary | ICD-10-CM

## 2022-09-22 DIAGNOSIS — I10 Essential (primary) hypertension: Secondary | ICD-10-CM

## 2022-09-22 MED ORDER — TELMISARTAN 80 MG PO TABS: 80.0000 mg | ORAL_TABLET | Freq: Every evening | ORAL | 3 refills | Status: DC

## 2022-09-22 MED ORDER — ACCU-CHEK SOFTCLIX LANCETS MISC: 3 refills | Status: AC

## 2022-09-22 MED ORDER — GLUCOSE BLOOD VI STRP: ORAL_STRIP | 12 refills | Status: DC

## 2022-09-22 NOTE — Progress Notes (Signed)
BP (!) 132/48   Pulse 64   Ht 5' (1.524 m)   Wt 135 lb (61.2 kg)   SpO2 98%   BMI 26.37 kg/m    Subjective:   Patient ID: Melissa Carpenter, female    DOB: 1934/05/02, 89 y.o.   MRN: [id number]  HPI: Melissa Carpenter is a 89 y.o. female presenting on 09/22/2022 for Medical Management of Chronic Issues, Hyperlipidemia, and Hypertension   HPI Hypertension Patient is currently on amlodipine and hydralazine and metoprolol and telmisartan, and their blood pressure today is 132/40, she says she runs up sometimes in the morning but then she runs better through the day.. Patient denies any lightheadedness or dizziness. Patient denies headaches, blurred vision, chest pains, shortness of breath, or weakness. Denies any side effects from medication and is content with current medication.   Hyperlipidemia Patient is coming in for recheck of his hyperlipidemia. The patient is currently taking atorvastatin. They deny any issues with myalgias or history of liver damage from it. They deny any focal numbness or weakness or chest pain.   Prediabetes Patient comes in today for recheck of his diabetes. Patient has been currently taking no medicine currently, diet control. Patient is currently on an ACE inhibitor/ARB. Patient has not seen an ophthalmologist this year. Patient denies any issues with their feet. The symptom started onset as an adult hypertension and hyperlipidemia and carotid artery disease ARE RELATED TO DM   Relevant past medical, surgical, family and social history reviewed and updated as indicated. Interim medical history since our last visit reviewed. Allergies and medications reviewed and updated.  Review of Systems  Constitutional:  Negative for chills and fever.  Eyes:  Negative for redness and visual disturbance.  Respiratory:  Negative for chest tightness and shortness of breath.   Cardiovascular:  Negative for chest pain and leg swelling.  Genitourinary:  Negative for difficulty urinating  and dysuria.  Musculoskeletal:  Negative for back pain and gait problem.  Skin:  Negative for rash.  Neurological:  Negative for light-headedness and headaches.  Psychiatric/Behavioral:  Negative for agitation and behavioral problems.   All other systems reviewed and are negative.   Per HPI unless specifically indicated above   Allergies as of 09/22/2022       Reactions   Codeine Hives   Forteo [parathyroid Hormone (recomb)] Other (See Comments)   Weakness and calcium increase   Raloxifene Other (See Comments)   Eye problems Other reaction(s): Other (See Comments) Eye problems Eye problems   Morphine Rash   Penicillins Rash   Sulfites Rash   Sulfonamide Derivatives Rash        Medication List        Accurate as of September 22, 2022 10:13 AM. If you have any questions, ask your nurse or doctor.          Accu-Chek Softclix Lancets lancets Use to check blood sugars daily   albuterol 108 (90 Base) MCG/ACT inhaler Commonly known as: VENTOLIN HFA Inhale 2 puffs into the lungs every 6 (six) hours as needed for wheezing or shortness of breath.   amLODipine-atorvastatin 10-40 MG tablet Commonly known as: CADUET Take 1 tablet by mouth daily.   aspirin 81 MG tablet Take 81 mg by mouth daily.   benzonatate 100 MG capsule Commonly known as: Tessalon Perles Take 1 capsule (100 mg total) by mouth 3 (three) times daily as needed.   Calcium Carbonate-Vitamin D 600-400 MG-UNIT tablet Commonly known as: Caltrate 600+D Take 1 tablet by mouth  daily.   cholecalciferol 10 MCG (400 UNIT) Tabs tablet Commonly known as: VITAMIN D3 Take 1,000 Units by mouth.   fish oil-omega-3 fatty acids 1000 MG capsule Take 1 g by mouth daily.   fluticasone 50 MCG/ACT nasal spray Commonly known as: FLONASE Place 2 sprays into both nostrils daily.   glucose blood test strip Check blood glucose QD and PRN DxE11.9   guaiFENesin 600 MG 12 hr tablet Commonly known as: Mucinex Take 1  tablet (600 mg total) by mouth 2 (two) times daily.   hydrALAZINE 25 MG tablet Commonly known as: APRESOLINE Take 1 tablet (25 mg total) by mouth 3 (three) times daily.   metoprolol succinate 100 MG 24 hr tablet Commonly known as: TOPROL-XL Take 1 tablet (100 mg total) by mouth daily. TAKE WITH OR IMMEDIATELY FOLLOWING A MEAL.   multivitamin tablet Take 1 tablet by mouth daily.   telmisartan 80 MG tablet Commonly known as: MICARDIS TAKE 1 TABLET BY MOUTH EVERY DAY IN THE EVENING   triamcinolone cream 0.1 % Commonly known as: KENALOG APPLY TO AFFECTED AREA TWICE A DAY         Objective:   BP (!) 132/48   Pulse 64   Ht 5' (1.524 m)   Wt 135 lb (61.2 kg)   SpO2 98%   BMI 26.37 kg/m   Wt Readings from Last 3 Encounters:  09/22/22 135 lb (61.2 kg)  08/23/22 136 lb (61.7 kg)  06/27/22 135 lb 6.4 oz (61.4 kg)    Physical Exam Vitals and nursing note reviewed.  Constitutional:      General: She is not in acute distress.    Appearance: She is well-developed. She is not diaphoretic.  Eyes:     Conjunctiva/sclera: Conjunctivae normal.  Cardiovascular:     Rate and Rhythm: Normal rate and regular rhythm.     Heart sounds: Normal heart sounds. No murmur heard. Pulmonary:     Effort: Pulmonary effort is normal. No respiratory distress.     Breath sounds: Normal breath sounds. No wheezing.  Musculoskeletal:        General: No swelling or tenderness. Normal range of motion.  Skin:    General: Skin is warm and dry.     Findings: No rash.  Neurological:     Mental Status: She is alert and oriented to person, place, and time.     Coordination: Coordination normal.  Psychiatric:        Behavior: Behavior normal.     Results for orders placed or performed in visit on 09/20/22  Lipid panel  Result Value Ref Range   Cholesterol, Total 142 100 - 199 mg/dL   Triglycerides 150 (H) 0 - 149 mg/dL   HDL 49 >39 mg/dL   VLDL Cholesterol Cal 26 5 - 40 mg/dL   LDL Chol Calc  (NIH) 67 0 - 99 mg/dL   Chol/HDL Ratio 2.9 0.0 - 4.4 ratio  CMP14+EGFR  Result Value Ref Range   Glucose 109 (H) 70 - 99 mg/dL   BUN 12 8 - 27 mg/dL   Creatinine, Ser 0.61 0.57 - 1.00 mg/dL   eGFR 87 >59 mL/min/1.73   BUN/Creatinine Ratio 20 12 - 28   Sodium 144 134 - 144 mmol/L   Potassium 4.2 3.5 - 5.2 mmol/L   Chloride 106 96 - 106 mmol/L   CO2 22 20 - 29 mmol/L   Calcium 9.7 8.7 - 10.3 mg/dL   Total Protein 7.1 6.0 - 8.5 g/dL   Albumin  4.8 (H) 3.7 - 4.7 g/dL   Globulin, Total 2.3 1.5 - 4.5 g/dL   Albumin/Globulin Ratio 2.1 1.2 - 2.2   Bilirubin Total 0.4 0.0 - 1.2 mg/dL   Alkaline Phosphatase 114 44 - 121 IU/L   AST 41 (H) 0 - 40 IU/L   ALT 41 (H) 0 - 32 IU/L  CBC with Differential/Platelet  Result Value Ref Range   WBC 4.8 3.4 - 10.8 x10E3/uL   RBC 4.22 3.77 - 5.28 x10E6/uL   Hemoglobin 13.3 11.1 - 15.9 g/dL   Hematocrit 39.0 34.0 - 46.6 %   MCV 92 79 - 97 fL   MCH 31.5 26.6 - 33.0 pg   MCHC 34.1 31.5 - 35.7 g/dL   RDW 12.8 11.7 - 15.4 %   Platelets 198 150 - 450 x10E3/uL   Neutrophils 53 Not Estab. %   Lymphs 30 Not Estab. %   Monocytes 11 Not Estab. %   Eos 5 Not Estab. %   Basos 1 Not Estab. %   Neutrophils Absolute 2.6 1.4 - 7.0 x10E3/uL   Lymphocytes Absolute 1.4 0.7 - 3.1 x10E3/uL   Monocytes Absolute 0.5 0.1 - 0.9 x10E3/uL   EOS (ABSOLUTE) 0.2 0.0 - 0.4 x10E3/uL   Basophils Absolute 0.1 0.0 - 0.2 x10E3/uL   Immature Granulocytes 0 Not Estab. %   Immature Grans (Abs) 0.0 0.0 - 0.1 x10E3/uL  Bayer DCA Hb A1c Waived  Result Value Ref Range   HB A1C (BAYER DCA - WAIVED) 6.3 (H) 4.8 - 5.6 %    Assessment & Plan:   Problem List Items Addressed This Visit       Cardiovascular and Mediastinum   Essential hypertension (Chronic)     Other   Dyslipidemia - Primary (Chronic)   Pre-diabetes   Relevant Medications   Accu-Chek Softclix Lancets lancets   glucose blood test strip  Allowing permissive hypertension for blood pressure, no changes  there  Continue current medicine    Follow up plan: Return in about 4 months (around 01/21/2023), or if symptoms worsen or fail to improve, for Prediabetes recheck.  Counseling provided for all of the vaccine components No orders of the defined types were placed in this encounter.   Caryl Pina, MD [facility] 09/22/2022, 10:13 AM

## 2022-10-18 ENCOUNTER — Other Ambulatory Visit: Payer: Self-pay | Admitting: Cardiology

## 2022-10-25 ENCOUNTER — Encounter: Payer: Self-pay | Admitting: Family Medicine

## 2022-10-25 ENCOUNTER — Ambulatory Visit (INDEPENDENT_AMBULATORY_CARE_PROVIDER_SITE_OTHER): Payer: Medicare Other | Admitting: Family Medicine

## 2022-10-25 VITALS — BP 130/54 | HR 67 | Temp 97.2°F | Ht 61.0 in | Wt 135.8 lb

## 2022-10-25 DIAGNOSIS — L282 Other prurigo: Secondary | ICD-10-CM | POA: Diagnosis not present

## 2022-10-25 MED ORDER — PREDNISONE 20 MG PO TABS: 40.0000 mg | ORAL_TABLET | Freq: Every day | ORAL | 0 refills | Status: AC

## 2022-10-25 NOTE — Progress Notes (Signed)
Subjective:  Patient ID: Melissa Carpenter, female    DOB: [birth year], 89 y.o.   MRN: [id number]  Patient Care Team: [name], [name], MD as PCP - General (Family Medicine) Paralee Cancel, MD as Consulting Physician (Orthopedic Surgery) Minus Breeding, MD as Consulting Physician (Cardiology) Angelia Mould, MD as Consulting Physician (Vascular Surgery)   Chief Complaint:  Rash (Bilateral hands and lower back- patient states it has been there a few days and itches )   HPI: Melissa Carpenter is a 89 y.o. female presenting on 10/25/2022 for Rash (Bilateral hands and lower back- patient states it has been there a few days and itches )   Rash This is a new problem. The current episode started in the past 7 days. The problem has been waxing and waning since onset. The affected locations include the left lower leg, right lower leg, back, right hand and left hand. The rash is characterized by redness, itchiness and dryness. Associated with: recently increased hydralazine to three times daily dosing. Associated symptoms include shortness of breath (with exertion). Pertinent negatives include no anorexia, congestion, cough, diarrhea, eye pain, facial edema, fatigue, fever, joint pain, nail changes, rhinorrhea, sore throat or vomiting. Past treatments include moisturizer. The treatment provided no relief.   Relevant past medical, surgical, family, and social history reviewed and updated as indicated.  Allergies and medications reviewed and updated. Data reviewed: Chart in Epic.   Past Medical History:  Diagnosis Date   Aortic insufficiency    Mild, echo, December, 2009   Arthritis    Carotid artery disease ([place])    Cataract    Chest pain    Catheterization, 2003, no significant CAD   Diastolic dysfunction    Mild diastolic dysfunction, echo, December, 2012   DJD (degenerative joint disease)    Dyslipidemia    GERD (gastroesophageal reflux disease)    Hyperlipidemia    Hypertension     Mitral valve prolapse    Osteoporosis    Prediabetes    Thyroid nodule     Past Surgical History:  Procedure Laterality Date   ABDOMINAL HYSTERECTOMY  1964   APPENDECTOMY  1946   BIOPSY THYROID Left    CARDIAC CATHETERIZATION  sept 2003   no significant cad   CATARACT EXTRACTION, BILATERAL     COLONOSCOPY     NASAL SINUS SURGERY     TONSILLECTOMY      Social History   Socioeconomic History   Marital status: Married    Spouse name: Thersa Salt Advertising account planner)   Number of children: 4   Years of education: Not on file   Highest education level: Not on file  Occupational History   Occupation: retired    Fish farm manager: [employer]  Tobacco Use   Smoking status: Never   Smokeless tobacco: Never  Vaping Use   Vaping Use: Never used  Substance and Sexual Activity   Alcohol use: No    Alcohol/week: 0.0 standard drinks of alcohol   Drug use: No   Sexual activity: Never  Other Topics Concern   Not on file  Social History Narrative   Lives in 2 story home with her husband   Children live nearby   Social Determinants of Health   Financial Resource Strain: [place]  (05/10/2021)   Overall Financial Resource Strain (CARDIA)    Difficulty of Paying Living Expenses: Not very hard  Food Insecurity: No Food Insecurity (05/10/2021)   Hunger Vital Sign    Worried About Running Out of  Food in the Last Year: Never true    [place] in the Last Year: Never true  Transportation Needs: No Transportation Needs (05/10/2021)   PRAPARE - Hydrologist (Medical): No    Lack of Transportation (Non-Medical): No  Physical Activity: Insufficiently Active (05/10/2021)   Exercise Vital Sign    Days of Exercise per Week: 7 days    Minutes of Exercise per Session: 10 min  Stress: No Stress Concern Present (05/10/2021)   [place]    Feeling of Stress : Not at all  Social Connections: Moderately Isolated  (05/10/2021)   Social Connection and Isolation Panel [NHANES]    Frequency of Communication with Friends and Family: More than three times a week    Frequency of Social Gatherings with Friends and Family: More than three times a week    Attends Religious Services: Never    Marine scientist or Organizations: No    Attends Archivist Meetings: Never    Marital Status: Married  Human resources officer Violence: Not At Risk (05/10/2021)   Humiliation, Afraid, Rape, and Kick questionnaire    Fear of Current or Ex-Partner: No    Emotionally Abused: No    Physically Abused: No    Sexually Abused: No    Outpatient Encounter Medications as of 10/25/2022  Medication Sig   Accu-Chek Softclix Lancets lancets Use to check blood sugars daily   albuterol (VENTOLIN HFA) 108 (90 Base) MCG/ACT inhaler Inhale 2 puffs into the lungs every 6 (six) hours as needed for wheezing or shortness of breath.   amLODipine-atorvastatin (CADUET) 10-40 MG tablet Take 1 tablet by mouth daily.   aspirin 81 MG tablet Take 81 mg by mouth daily.   Calcium Carbonate-Vitamin D (CALTRATE 600+D) 600-400 MG-UNIT per tablet Take 1 tablet by mouth daily.   cholecalciferol (VITAMIN D) 400 UNITS TABS Take 1,000 Units by mouth.   fish oil-omega-3 fatty acids 1000 MG capsule Take 1 g by mouth daily.   fluticasone (FLONASE) 50 MCG/ACT nasal spray Place 2 sprays into both nostrils daily.   glucose blood test strip Check blood glucose QD and PRN DxE11.9   guaiFENesin (MUCINEX) 600 MG 12 hr tablet Take 1 tablet (600 mg total) by mouth 2 (two) times daily.   hydrALAZINE (APRESOLINE) 25 MG tablet TAKE 1 TABLET (25 MG TOTAL) BY MOUTH IN THE MORNING AND AT BEDTIME.   metoprolol succinate (TOPROL-XL) 100 MG 24 hr tablet Take 1 tablet (100 mg total) by mouth daily. TAKE WITH OR IMMEDIATELY FOLLOWING A MEAL.   Multiple Vitamin (MULTIVITAMIN) tablet Take 1 tablet by mouth daily.   predniSONE (DELTASONE) 20 MG tablet Take 2 tablets (40 mg  total) by mouth daily with breakfast for 5 days. 2 po daily for 5 days   telmisartan (MICARDIS) 80 MG tablet Take 1 tablet (80 mg total) by mouth every evening.   triamcinolone cream (KENALOG) 0.1 % APPLY TO AFFECTED AREA TWICE A DAY   [DISCONTINUED] benzonatate (TESSALON PERLES) 100 MG capsule Take 1 capsule (100 mg total) by mouth 3 (three) times daily as needed. (Patient not taking: Reported on 10/25/2022)   No facility-administered encounter medications on file as of 10/25/2022.    Allergies  Allergen Reactions   Codeine Hives   Forteo [Parathyroid Hormone (Recomb)] Other (See Comments)    Weakness and calcium increase   Raloxifene Other (See Comments)    Eye problems Other reaction(s): Other (  See Comments) Eye problems Eye problems   Morphine Rash   Penicillins Rash   Sulfites Rash   Sulfonamide Derivatives Rash    Review of Systems  Constitutional:  Negative for activity change, appetite change, chills, diaphoresis, fatigue, fever and unexpected weight change.  HENT: Negative.  Negative for congestion, rhinorrhea, sore throat, trouble swallowing and voice change.   Eyes: Negative.  Negative for photophobia, pain and visual disturbance.  Respiratory:  Positive for shortness of breath (with exertion). Negative for apnea, cough, choking, chest tightness, wheezing and stridor.   Cardiovascular:  Negative for chest pain, palpitations and leg swelling.  Gastrointestinal:  Negative for abdominal distention, abdominal pain, anal bleeding, anorexia, blood in stool, constipation, diarrhea, nausea and vomiting.  Endocrine: Negative.   Genitourinary:  Negative for decreased urine volume, difficulty urinating, dysuria, frequency and urgency.  Musculoskeletal:  Negative for arthralgias, joint pain and myalgias.  Skin:  Positive for color change and rash. Negative for nail changes, pallor and wound.  Allergic/Immunologic: Negative.   Neurological:  Negative for dizziness and headaches.   Hematological: Negative.   Psychiatric/Behavioral:  Negative for confusion, decreased concentration, dysphoric mood, hallucinations, self-injury, sleep disturbance and suicidal ideas. The patient is nervous/anxious. The patient is not hyperactive.   All other systems reviewed and are negative.       Objective:  BP (!) 130/54   Pulse 67   Temp (!) 97.2 F (36.2 C) (Temporal)   Ht '5\' 1"'$  (1.549 m)   Wt 135 lb 12.8 oz (61.6 kg)   BMI 25.66 kg/m    Wt Readings from Last 3 Encounters:  10/25/22 135 lb 12.8 oz (61.6 kg)  09/22/22 135 lb (61.2 kg)  08/23/22 136 lb (61.7 kg)    Physical Exam Vitals and nursing note reviewed.  Constitutional:      General: She is not in acute distress.    Appearance: Normal appearance. She is obese. She is not ill-appearing, toxic-appearing or diaphoretic.  HENT:     Head: Normocephalic and atraumatic.     Nose: Nose normal.     Mouth/Throat:     Mouth: Mucous membranes are moist.     Pharynx: Oropharynx is clear.  Eyes:     Conjunctiva/sclera: Conjunctivae normal.     Pupils: Pupils are equal, round, and reactive to light.  Cardiovascular:     Rate and Rhythm: Normal rate and regular rhythm.     Heart sounds: Murmur (radiates to carotid, loudest at RUSB) heard.     Systolic murmur is present with a grade of 4/6.  Pulmonary:     Effort: Pulmonary effort is normal. No respiratory distress.     Breath sounds: Normal breath sounds. No stridor. No wheezing, rhonchi or rales.  Chest:     Chest wall: No tenderness.  Abdominal:     General: Bowel sounds are normal.     Palpations: Abdomen is soft.  Musculoskeletal:     Right lower leg: No edema.     Left lower leg: No edema.  Skin:    General: Skin is warm and dry.     Capillary Refill: Capillary refill takes less than 2 seconds.     Findings: Erythema and rash present. Rash is papular and urticarial.     Comments: Red, raised rash to bilateral hands, knees, and lower back. No drainage.    Neurological:     General: No focal deficit present.     Mental Status: She is alert and oriented to person, place, and time.  Psychiatric:        Mood and Affect: Mood normal.        Behavior: Behavior normal.        Thought Content: Thought content normal.        Judgment: Judgment normal.     Results for orders placed or performed in visit on 09/20/22  Lipid panel  Result Value Ref Range   Cholesterol, Total 142 100 - 199 mg/dL   Triglycerides 150 (H) 0 - 149 mg/dL   HDL 49 >39 mg/dL   VLDL Cholesterol Cal 26 5 - 40 mg/dL   LDL Chol Calc (NIH) 67 0 - 99 mg/dL   Chol/HDL Ratio 2.9 0.0 - 4.4 ratio  CMP14+EGFR  Result Value Ref Range   Glucose 109 (H) 70 - 99 mg/dL   BUN 12 8 - 27 mg/dL   Creatinine, Ser 0.61 0.57 - 1.00 mg/dL   eGFR 87 >59 mL/min/1.73   BUN/Creatinine Ratio 20 12 - 28   Sodium 144 134 - 144 mmol/L   Potassium 4.2 3.5 - 5.2 mmol/L   Chloride 106 96 - 106 mmol/L   CO2 22 20 - 29 mmol/L   Calcium 9.7 8.7 - 10.3 mg/dL   Total Protein 7.1 6.0 - 8.5 g/dL   Albumin 4.8 (H) 3.7 - 4.7 g/dL   Globulin, Total 2.3 1.5 - 4.5 g/dL   Albumin/Globulin Ratio 2.1 1.2 - 2.2   Bilirubin Total 0.4 0.0 - 1.2 mg/dL   Alkaline Phosphatase 114 44 - 121 IU/L   AST 41 (H) 0 - 40 IU/L   ALT 41 (H) 0 - 32 IU/L  CBC with Differential/Platelet  Result Value Ref Range   WBC 4.8 3.4 - 10.8 x10E3/uL   RBC 4.22 3.77 - 5.28 x10E6/uL   Hemoglobin 13.3 11.1 - 15.9 g/dL   Hematocrit 39.0 34.0 - 46.6 %   MCV 92 79 - 97 fL   MCH 31.5 26.6 - 33.0 pg   MCHC 34.1 31.5 - 35.7 g/dL   RDW 12.8 11.7 - 15.4 %   Platelets 198 150 - 450 x10E3/uL   Neutrophils 53 Not Estab. %   Lymphs 30 Not Estab. %   Monocytes 11 Not Estab. %   Eos 5 Not Estab. %   Basos 1 Not Estab. %   Neutrophils Absolute 2.6 1.4 - 7.0 x10E3/uL   Lymphocytes Absolute 1.4 0.7 - 3.1 x10E3/uL   Monocytes Absolute 0.5 0.1 - 0.9 x10E3/uL   EOS (ABSOLUTE) 0.2 0.0 - 0.4 x10E3/uL   Basophils Absolute 0.1 0.0 - 0.2 x10E3/uL    Immature Granulocytes 0 Not Estab. %   Immature Grans (Abs) 0.0 0.0 - 0.1 x10E3/uL  Bayer DCA Hb A1c Waived  Result Value Ref Range   HB A1C (BAYER DCA - WAIVED) 6.3 (H) 4.8 - 5.6 %       Pertinent labs & imaging results that were available during my care of the patient were reviewed by me and considered in my medical decision making.  Assessment & Plan:  Oshyn was seen today for rash.  Diagnoses and all orders for this visit:  Pruritic rash Unknown etiology but concerning for reaction to recent increase in hydralazine dosing to three times daily, could also be anxiety driven. Rash is raised and erythematous, will burst with steroids. Pt aware of red flags. Aware to report new, worsening, or persistent symptoms. CeraVe or Cetaphil use encouraged.  -     predniSONE (DELTASONE) 20 MG tablet; Take 2 tablets (40  mg total) by mouth daily with breakfast for 5 days. 2 po daily for 5 days     Continue all other maintenance medications.  Follow up plan: Return if symptoms worsen or fail to improve.   Continue healthy lifestyle choices, including diet (rich in fruits, vegetables, and lean proteins, and low in salt and simple carbohydrates) and exercise (at least 30 minutes of moderate physical activity daily).  Educational handout given for rash  The above assessment and management plan was discussed with the patient. The patient verbalized understanding of and has agreed to the management plan. Patient is aware to call the clinic if they develop any new symptoms or if symptoms persist or worsen. Patient is aware when to return to the clinic for a follow-up visit. Patient educated on when it is appropriate to go to the emergency department.   Monia Pouch, FNP-C [facility] [phone]

## 2022-10-25 NOTE — Patient Instructions (Addendum)
CeraVe or Cetaphil  

## 2022-11-07 ENCOUNTER — Ambulatory Visit: Payer: Medicare Other

## 2022-11-13 ENCOUNTER — Telehealth: Payer: Self-pay | Admitting: Cardiology

## 2022-11-13 NOTE — Telephone Encounter (Signed)
Patient concerned BP is high.  She states she has moments of dizzy or headache but does not think it is the same time when her BP is high  She takes amlodipine-Atorvastatin and Telmisartan in the evening.  She takes Metoprolol in the morning.  She takes the Hydralazine Morning, 2pm and evening . When asked why she is taking her BP in the middle of the night,. She states "my hear is flying and wakes me up. It's beating so hard".  Readings listed.  Also advised to take BP before and after meds in the morning and evening and add to log going forward.  April 1       4am  194/79  HR 84   9am 166/64  HR 65  2:45pm  127/51  HR 60(after meds at 9:30) March 31  9am  172/65 HR 71  before meds March 29  7am  166/64 HR 59   before meds March 28  3am  171/64 HR 63  9am 163/64  HR 67  11:15  145/57  HR 63(after meds) March 27  9am  167/67  HR 62  12:40 138/56  HR 63(after meds at 9:30)

## 2022-11-13 NOTE — Telephone Encounter (Signed)
Pt states her bp has been elevated. Did not have readings with her at the time. Pt asked for a c/b from RN

## 2022-11-14 MED ORDER — HYDRALAZINE HCL 25 MG PO TABS: ORAL_TABLET | ORAL | 3 refills | Status: DC

## 2022-11-14 NOTE — Telephone Encounter (Signed)
Spoke to patient Dr.Hochrein's advice given.She stated she has been feeling weak and sob for the past 1 month.Appointment scheduled with Diona Browner NP 4/11 at 8:50 am at [facility] office.Advised to monitor B/P daily and bring a list of readings to appointment.

## 2022-11-23 ENCOUNTER — Ambulatory Visit: Payer: Medicare Other | Admitting: Nurse Practitioner

## 2022-12-03 ENCOUNTER — Other Ambulatory Visit: Payer: Self-pay | Admitting: Family Medicine

## 2022-12-03 DIAGNOSIS — J069 Acute upper respiratory infection, unspecified: Secondary | ICD-10-CM

## 2023-01-22 ENCOUNTER — Ambulatory Visit: Payer: Medicare Other

## 2023-02-01 ENCOUNTER — Telehealth: Payer: Self-pay | Admitting: Family Medicine

## 2023-02-01 DIAGNOSIS — E785 Hyperlipidemia, unspecified: Secondary | ICD-10-CM

## 2023-02-01 DIAGNOSIS — R7303 Prediabetes: Secondary | ICD-10-CM

## 2023-02-01 DIAGNOSIS — I10 Essential (primary) hypertension: Secondary | ICD-10-CM

## 2023-02-01 NOTE — Telephone Encounter (Signed)
Patient needs orders added for lab work. Made appt for 7/8 and she has an office visit with [name] on 7/11

## 2023-02-12 NOTE — Telephone Encounter (Signed)
Lab orders placed.  

## 2023-02-16 ENCOUNTER — Telehealth: Payer: Self-pay | Admitting: Family Medicine

## 2023-02-16 ENCOUNTER — Ambulatory Visit (INDEPENDENT_AMBULATORY_CARE_PROVIDER_SITE_OTHER): Payer: Medicare Other | Admitting: Family Medicine

## 2023-02-16 ENCOUNTER — Encounter: Payer: Self-pay | Admitting: Family Medicine

## 2023-02-16 VITALS — BP 122/54 | HR 5 | Ht 61.0 in | Wt 133.0 lb

## 2023-02-16 DIAGNOSIS — I10 Essential (primary) hypertension: Secondary | ICD-10-CM | POA: Diagnosis not present

## 2023-02-16 DIAGNOSIS — E041 Nontoxic single thyroid nodule: Secondary | ICD-10-CM | POA: Diagnosis not present

## 2023-02-16 DIAGNOSIS — Z1231 Encounter for screening mammogram for malignant neoplasm of breast: Secondary | ICD-10-CM

## 2023-02-16 DIAGNOSIS — E785 Hyperlipidemia, unspecified: Secondary | ICD-10-CM

## 2023-02-16 DIAGNOSIS — R7303 Prediabetes: Secondary | ICD-10-CM

## 2023-02-16 LAB — BAYER DCA HB A1C WAIVED: HB A1C (BAYER DCA - WAIVED): 5.6 % (ref 4.8–5.6)

## 2023-02-16 NOTE — Telephone Encounter (Signed)
Future lab orders placed

## 2023-02-16 NOTE — Telephone Encounter (Signed)
Please add orders for labs for patient, made appt for 06/18/23 for labwork and 06/21/23 for appt with PCP

## 2023-02-16 NOTE — Progress Notes (Signed)
BP (!) 122/54   Pulse (!) 5   Ht 5\' 1"  (1.549 m)   Wt 133 lb (60.3 kg)   SpO2 95%   BMI 25.13 kg/m    Subjective:   Patient ID: Melissa Carpenter, female    DOB: [birth year], 89 y.o.   MRN: [id number]  HPI: Melissa Carpenter is a 89 y.o. female presenting on 02/16/2023 for Medical Management of Chronic Issues and Prediabetes   HPI Prediabetes Patient comes in today for recheck of his diabetes. Patient has been currently taking no medication. Patient is currently on an ACE inhibitor/ARB. Patient has not seen an ophthalmologist this year. Patient denies any new issues with their feet. The symptom started onset as an adult hypertension and hyperlipidemia ARE RELATED TO DM   Hypertension Patient is currently on amlodipine and metoprolol and telmisartan, and their blood pressure today is 122/54. Patient denies any lightheadedness or dizziness. Patient denies headaches, blurred vision, chest pains, shortness of breath, or weakness. Denies any side effects from medication and is content with current medication.   Hyperlipidemia Patient is coming in for recheck of his hyperlipidemia. The patient is currently taking atorvastatin. They deny any issues with myalgias or history of liver damage from it. They deny any focal numbness or weakness or chest pain.   Relevant past medical, surgical, family and social history reviewed and updated as indicated. Interim medical history since our last visit reviewed. Allergies and medications reviewed and updated.  Review of Systems  Constitutional:  Negative for chills and fever.  Eyes:  Negative for visual disturbance.  Respiratory:  Negative for chest tightness and shortness of breath.   Cardiovascular:  Negative for chest pain and leg swelling.  Musculoskeletal:  Negative for back pain and gait problem.  Skin:  Negative for rash.  Neurological:  Negative for dizziness, light-headedness and headaches.  Psychiatric/Behavioral:  Negative for agitation and behavioral  problems.   All other systems reviewed and are negative.   Per HPI unless specifically indicated above   Allergies as of 02/16/2023       Reactions   Codeine Hives   Forteo [parathyroid Hormone (recomb)] Other (See Comments)   Weakness and calcium increase   Raloxifene Other (See Comments)   Eye problems Other reaction(s): Other (See Comments) Eye problems Eye problems   Morphine Rash   Penicillins Rash   Sulfites Rash   Sulfonamide Derivatives Rash        Medication List        Accurate as of February 16, 2023  1:44 PM. If you have any questions, ask your nurse or doctor.          STOP taking these medications    guaiFENesin 600 MG 12 hr tablet Commonly known as: Mucinex Stopped by: [name] Charlise Giovanetti, MD       TAKE these medications    Accu-Chek Softclix Lancets lancets Use to check blood sugars daily   albuterol 108 (90 Base) MCG/ACT inhaler Commonly known as: VENTOLIN HFA Inhale 2 puffs into the lungs every 6 (six) hours as needed for wheezing or shortness of breath.   amLODipine-atorvastatin 10-40 MG tablet Commonly known as: CADUET Take 1 tablet by mouth daily.   aspirin 81 MG tablet Take 81 mg by mouth daily.   Calcium Carbonate-Vitamin D 600-400 MG-UNIT tablet Commonly known as: Caltrate 600+D Take 1 tablet by mouth daily.   cholecalciferol 10 MCG (400 UNIT) Tabs tablet Commonly known as: VITAMIN D3 Take 1,000 Units by mouth.   fish  oil-omega-3 fatty acids 1000 MG capsule Take 1 g by mouth daily.   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 TAKE WITH OR IMMEDIATELY  FOLLOWING A MEAL.   multivitamin tablet Take 1 tablet by mouth daily.   telmisartan 80 MG tablet Commonly known as: MICARDIS Take 1 tablet (80 mg total) by mouth every evening.   triamcinolone cream 0.1 % Commonly known as: KENALOG APPLY TO AFFECTED AREA TWICE A DAY         Objective:   BP (!) 122/54   Pulse (!) 5   Ht 5\' 1"  (1.549 m)   Wt 133 lb (60.3 kg)   SpO2 95%   BMI 25.13 kg/m   Wt Readings from Last 3 Encounters:  02/16/23 133 lb (60.3 kg)  10/25/22 135 lb 12.8 oz (61.6 kg)  09/22/22 135 lb (61.2 kg)    Physical Exam Vitals and nursing note reviewed.  Constitutional:      General: She is not in acute distress.    Appearance: She is well-developed. She is not diaphoretic.  Eyes:     Conjunctiva/sclera: Conjunctivae normal.  Cardiovascular:     Rate and Rhythm: Normal rate and regular rhythm.     Heart sounds: Normal heart sounds. No murmur heard. Pulmonary:     Effort: Pulmonary effort is normal. No respiratory distress.     Breath sounds: Normal breath sounds. No wheezing.  Musculoskeletal:        General: No swelling or tenderness. Normal range of motion.  Skin:    General: Skin is warm and dry.     Findings: No rash.  Neurological:     Mental Status: She is alert and oriented to person, place, and time.     Coordination: Coordination normal.  Psychiatric:        Behavior: Behavior normal.       Assessment & Plan:   Problem List Items Addressed This Visit       Cardiovascular and Mediastinum   Essential hypertension (Chronic)     Other   Dyslipidemia (Chronic)   Pre-diabetes   Other Visit Diagnoses     Encounter for screening mammogram for malignant neoplasm of breast    -  Primary   Relevant Orders   MM 3D SCREENING MAMMOGRAM BILATERAL BREAST   Thyroid nodule       Relevant Orders   US THYROID       A1c was 5.6, looks good, will schedule for repeat thyroid ultrasound and mammograms. Follow up plan: Return in about 4 months  (around 06/19/2023), or if symptoms worsen or fail to improve, for Prediabetes and hypertension and cholesterol recheck.  Counseling provided for all of the vaccine components Orders Placed This Encounter  Procedures   MM 3D SCREENING MAMMOGRAM BILATERAL BREAST   US THYROID    Arville Care, MD [facility] 02/16/2023, 1:44 PM

## 2023-02-17 LAB — LIPID PANEL
Chol/HDL Ratio: 3.5 ratio (ref 0.0–4.4)
Cholesterol, Total: 119 mg/dL (ref 100–199)
HDL: 34 mg/dL — ABNORMAL LOW (ref >39)
LDL Chol Calc (NIH): 37 mg/dL (ref 0–99)
Triglycerides: 325 mg/dL — ABNORMAL HIGH (ref 0–149)
VLDL Cholesterol Cal: 48 mg/dL — ABNORMAL HIGH (ref 5–40)

## 2023-02-17 LAB — CMP14+EGFR
ALT: 24 IU/L (ref 0–32)
AST: 28 IU/L (ref 0–40)
Albumin: 4.1 g/dL (ref 3.7–4.7)
Alkaline Phosphatase: 118 IU/L (ref 44–121)
BUN/Creatinine Ratio: 33 — ABNORMAL HIGH (ref 12–28)
BUN: 19 mg/dL (ref 8–27)
Bilirubin Total: 0.2 mg/dL (ref 0.0–1.2)
CO2: 19 mmol/L — ABNORMAL LOW (ref 20–29)
Calcium: 9.4 mg/dL (ref 8.7–10.3)
Chloride: 105 mmol/L (ref 96–106)
Creatinine, Ser: 0.57 mg/dL (ref 0.57–1.00)
Globulin, Total: 2.2 g/dL (ref 1.5–4.5)
Glucose: 140 mg/dL — ABNORMAL HIGH (ref 70–99)
Potassium: 4.1 mmol/L (ref 3.5–5.2)
Sodium: 140 mmol/L (ref 134–144)
Total Protein: 6.3 g/dL (ref 6.0–8.5)
eGFR: 88 mL/min/{1.73_m2} (ref >59)

## 2023-02-19 ENCOUNTER — Other Ambulatory Visit: Payer: Medicare Other

## 2023-02-22 ENCOUNTER — Ambulatory Visit: Payer: Medicare Other | Admitting: Family Medicine

## 2023-02-28 ENCOUNTER — Ambulatory Visit (HOSPITAL_COMMUNITY): Payer: Medicare Other

## 2023-03-06 ENCOUNTER — Other Ambulatory Visit: Payer: Self-pay

## 2023-03-06 ENCOUNTER — Emergency Department (HOSPITAL_COMMUNITY)
Admission: EM | Admit: 2023-03-06 | Discharge: 2023-03-06 | Disposition: A | Discharge status: Home / Self Care | Payer: Medicare Other | Attending: Emergency Medicine | Admitting: Emergency Medicine

## 2023-03-06 ENCOUNTER — Encounter (HOSPITAL_COMMUNITY): Payer: Self-pay | Admitting: Emergency Medicine

## 2023-03-06 ENCOUNTER — Emergency Department (HOSPITAL_COMMUNITY): Payer: Medicare Other

## 2023-03-06 DIAGNOSIS — S40812A Abrasion of left upper arm, initial encounter: Secondary | ICD-10-CM

## 2023-03-06 DIAGNOSIS — W19XXXA Unspecified fall, initial encounter: Secondary | ICD-10-CM

## 2023-03-06 DIAGNOSIS — S0003XA Contusion of scalp, initial encounter: Secondary | ICD-10-CM

## 2023-03-06 DIAGNOSIS — M85832 Other specified disorders of bone density and structure, left forearm: Secondary | ICD-10-CM | POA: Diagnosis not present

## 2023-03-06 DIAGNOSIS — I10 Essential (primary) hypertension: Secondary | ICD-10-CM | POA: Diagnosis not present

## 2023-03-06 DIAGNOSIS — Z043 Encounter for examination and observation following other accident: Secondary | ICD-10-CM | POA: Diagnosis not present

## 2023-03-06 DIAGNOSIS — W01198A Fall on same level from slipping, tripping and stumbling with subsequent striking against other object, initial encounter: Secondary | ICD-10-CM | POA: Insufficient documentation

## 2023-03-06 DIAGNOSIS — S40022A Contusion of left upper arm, initial encounter: Secondary | ICD-10-CM | POA: Diagnosis not present

## 2023-03-06 DIAGNOSIS — Z79899 Other long term (current) drug therapy: Secondary | ICD-10-CM | POA: Diagnosis not present

## 2023-03-06 DIAGNOSIS — S0990XA Unspecified injury of head, initial encounter: Secondary | ICD-10-CM | POA: Diagnosis present

## 2023-03-06 NOTE — ED Triage Notes (Signed)
Pt via POV after a fall yesterday afternoon. She and her husband were unloading groceries and he fell backwards onto her, knocking her down. Pt fell on her back and hit her head on the concrete garage floor, no LOC and no thinners. Abrasions and bruising noted to left forearm. Pt denies pain and is a/o x 4 in triage.

## 2023-03-06 NOTE — ED Notes (Signed)
ED Provider at bedside. 

## 2023-03-06 NOTE — ED Provider Notes (Signed)
[facility] AT [facility] Provider Note   CSN: [id number] Arrival date & time: 03/06/23  1125     History  Chief Complaint  Patient presents with   Melissa Carpenter is a 89 y.o. female.  Pt s/p fall yesterday. She and spouse were unloading groceries, he fell back into her knocking her to ground. No loc. Ambulatory since. Superficial abrasion left elbow. Indicates feels sore/stiff today, but no focal pain. No headache. No midline neck or back pain. No chest pain or sob. No abd pain or nv. No bony extremity pain or injury. No anticoagulant use. Tetanus is up to date.   The history is provided by the patient and medical records.  Fall Pertinent negatives include no chest pain, no abdominal pain, no headaches and no shortness of breath.       Home Medications Prior to Admission medications   Medication Sig Start Date End Date Taking? Authorizing Provider  Accu-Chek Softclix Lancets lancets Use to check blood sugars daily 09/22/22   [name], [name], MD  albuterol (VENTOLIN HFA) 108 (90 Base) MCG/ACT inhaler Inhale 2 puffs into the lungs every 6 (six) hours as needed for wheezing or shortness of breath. 06/27/22   Sonny Masters, FNP  amLODipine-atorvastatin (CADUET) 10-40 MG tablet Take 1 tablet by mouth daily. 05/22/22   [name], [name], MD  aspirin 81 MG tablet Take 81 mg by mouth daily.    [provider]  Calcium Carbonate-Vitamin D (CALTRATE 600+D) 600-400 MG-UNIT per tablet Take 1 tablet by mouth daily. 03/05/13   Eckard, Babette Relic, RPH-CPP  cholecalciferol (VITAMIN D) 400 UNITS TABS Take 1,000 Units by mouth.    [provider]  fish oil-omega-3 fatty acids 1000 MG capsule Take 1 g by mouth daily.    [provider]  fluticasone (FLONASE) 50 MCG/ACT nasal spray SPRAY 2 SPRAYS INTO EACH NOSTRIL EVERY DAY 12/04/22   [name], [name], MD  glucose blood test strip Check blood glucose QD and PRN DxE11.9 09/22/22    [name], [name], MD  hydrALAZINE (APRESOLINE) 25 MG tablet Take 25 mg morning, 25 mg at lunch, and 50 mg ( 2 tablets ) at bedtime 11/14/22   Rollene Rotunda, MD  metoprolol succinate (TOPROL-XL) 100 MG 24 hr tablet Take 1 tablet (100 mg total) by mouth daily. TAKE WITH OR IMMEDIATELY FOLLOWING A MEAL. 05/22/22   [name], [name], MD  Multiple Vitamin (MULTIVITAMIN) tablet Take 1 tablet by mouth daily.    [provider]  telmisartan (MICARDIS) 80 MG tablet Take 1 tablet (80 mg total) by mouth every evening. 09/22/22   [name], [name], MD  triamcinolone cream (KENALOG) 0.1 % APPLY TO AFFECTED AREA TWICE A DAY 04/26/22   [name], [name], MD      Allergies    Codeine, Forteo [parathyroid hormone (recomb)], Raloxifene, Morphine, Penicillins, Sulfites, and Sulfonamide derivatives    Review of Systems   Review of Systems  Constitutional:  Negative for fever.  HENT:  Negative for nosebleeds.   Respiratory:  Negative for shortness of breath.   Cardiovascular:  Negative for chest pain.  Gastrointestinal:  Negative for abdominal pain and vomiting.  Musculoskeletal:  Negative for back pain and neck pain.  Neurological:  Negative for weakness, numbness and headaches.    Physical Exam Updated Vital Signs BP (!) 157/70 (BP Location: Right Arm)   Pulse 62   Temp 97.9 F (36.6 C) (Oral)   Resp 20  Ht 1.549 m (5\' 1" )   Wt 60.3 kg   SpO2 96%   BMI 25.13 kg/m  Physical Exam Vitals and nursing note reviewed.  Constitutional:      Appearance: Normal appearance. She is well-developed.  HENT:     Head:     Comments: Mild tenderness posterior scalp. No bruising or lac noted, no sts noted.     Nose: Nose normal.     Mouth/Throat:     Mouth: Mucous membranes are moist.  Eyes:     General: No scleral icterus.    Conjunctiva/sclera: Conjunctivae normal.     Pupils: Pupils are equal, round, and reactive to light.  Neck:     Vascular: No carotid bruit.     Trachea: No tracheal  deviation.  Cardiovascular:     Rate and Rhythm: Normal rate and regular rhythm.     Pulses: Normal pulses.     Heart sounds: Normal heart sounds. No murmur heard.    No friction rub. No gallop.  Pulmonary:     Effort: Pulmonary effort is normal. No respiratory distress.     Breath sounds: Normal breath sounds.  Chest:     Chest wall: No tenderness.  Abdominal:     General: There is no distension.     Palpations: Abdomen is soft.     Tenderness: There is no abdominal tenderness.  Musculoskeletal:        General: No swelling or tenderness.     Cervical back: Normal range of motion and neck supple. No rigidity. No muscular tenderness.     Comments: CTLS spine, non tender, aligned, no step off. Good rom bil extremities without pain or focal bony tenderness. V small, superficial abrasion to left forearm/elbow area.   Skin:    General: Skin is warm and dry.     Findings: No rash.  Neurological:     Mental Status: She is alert.     Comments: Alert, speech normal. GCS 15. Motor/sens grossly intact bil. Steady gait.   Psychiatric:        Mood and Affect: Mood normal.     ED Results / Procedures / Treatments   Labs (all labs ordered are listed, but only abnormal results are displayed) Labs Reviewed - No data to display  EKG None  Radiology No results found.  Procedures Procedures    Medications Ordered in ED Medications - No data to display  ED Course/ Medical Decision Making/ A&P                             Medical Decision Making Problems Addressed: Abrasion of left arm, initial encounter: acute illness or injury Accidental fall, initial encounter: acute illness or injury with systemic symptoms that poses a threat to life or bodily functions Contusion of scalp, initial encounter: acute illness or injury Essential hypertension: chronic illness or injury with exacerbation, progression, or side effects of treatment  Amount and/or Complexity of Data  Reviewed Independent Historian: spouse    Details: hx External Data Reviewed: notes. Radiology: ordered and independent interpretation performed. Decision-making details documented in ED Course.   No focal pain or injury noted.   Pt took acetaminophen this AM.  Tetanus is up to date.   Xrays reviewed/interpreted by me - no fx.  Reviewed nursing notes and prior charts for additional history.   Pt currently appears stable for d/c.   Return precautions provided.  Final Clinical Impression(s) / ED Diagnoses Final diagnoses:  None    Rx / DC Orders ED Discharge Orders     None         Cathren Laine, MD 03/06/23 1230

## 2023-03-06 NOTE — Discharge Instructions (Addendum)
It was our pleasure to provide your ER care today - we hope that you feel better. Take acetaminophen as need.   Fall precautions.   Return to ER if worse, new symptoms, new/severe pain, severe headache, vomiting, or other concern.

## 2023-03-12 ENCOUNTER — Telehealth: Payer: Self-pay

## 2023-03-12 NOTE — Telephone Encounter (Signed)
Transition Care Management Unsuccessful Follow-up Telephone Call  Date of discharge and from where:  03/06/2023 [facility]  Attempts:  1st Attempt  Reason for unsuccessful TCM follow-up call:  Left voice message  Hetal Proano [facility]  [facility] Population Health Community Resource Care Guide   [email or web address]  ?? [phone]   Website: [email or web address]  [email or web address]

## 2023-03-13 ENCOUNTER — Telehealth: Payer: Self-pay

## 2023-03-13 NOTE — Telephone Encounter (Signed)
Transition Care Management Unsuccessful Follow-up Telephone Call  Date of discharge and from where:  03/06/2023 [facility]  Attempts:  2nd Attempt  Reason for unsuccessful TCM follow-up call:  Left voice message  Shaylie Eklund [facility]  [facility] Population Health Community Resource Care Guide   [email or web address]  ?? [phone]   Website: [email or web address]  [email or web address]

## 2023-03-19 ENCOUNTER — Ambulatory Visit (HOSPITAL_COMMUNITY)
Admission: RE | Admit: 2023-03-19 | Discharge: 2023-03-19 | Disposition: A | Discharge status: Home / Self Care | Payer: Medicare Other | Source: Ambulatory Visit | Attending: Family Medicine | Admitting: Family Medicine

## 2023-03-19 DIAGNOSIS — E041 Nontoxic single thyroid nodule: Secondary | ICD-10-CM | POA: Diagnosis not present

## 2023-03-19 DIAGNOSIS — E042 Nontoxic multinodular goiter: Secondary | ICD-10-CM | POA: Diagnosis not present

## 2023-03-29 ENCOUNTER — Encounter: Payer: Self-pay | Admitting: Family Medicine

## 2023-03-29 ENCOUNTER — Ambulatory Visit (INDEPENDENT_AMBULATORY_CARE_PROVIDER_SITE_OTHER): Payer: Medicare Other | Admitting: Family Medicine

## 2023-03-29 VITALS — BP 130/84 | HR 59 | Temp 97.6°F | Ht 61.0 in | Wt 130.8 lb

## 2023-03-29 DIAGNOSIS — W19XXXA Unspecified fall, initial encounter: Secondary | ICD-10-CM

## 2023-03-29 DIAGNOSIS — M5416 Radiculopathy, lumbar region: Secondary | ICD-10-CM

## 2023-03-29 MED ORDER — METHYLPREDNISOLONE ACETATE 40 MG/ML IJ SUSP
40.0000 mg | Freq: Once | INTRAMUSCULAR | Status: AC
Start: 2023-03-29 — End: 2023-03-29
  Administered 2023-03-29: 60 mg via INTRAMUSCULAR

## 2023-03-29 NOTE — Progress Notes (Signed)
Subjective:  Patient ID: Melissa Carpenter, female    DOB: [birth year], 89 y.o.   MRN: [id number]  Patient Care Team: [name], [name], MD as PCP - General (Family Medicine) Durene Romans, MD as Consulting Physician (Orthopedic Surgery) Rollene Rotunda, MD as Consulting Physician (Cardiology) Chuck Hint, MD as Consulting Physician (Vascular Surgery)   Chief Complaint:  ER follow up (03/06/2023 (2 hours)/[place] [facility]- fall ) and Back Pain   HPI: Melissa Carpenter is a 89 y.o. female presenting on 03/29/2023 for ER follow up (03/06/2023 (2 hours)/[place] [facility]- fall ) and Back Pain   Pt presents today for ED follow up. She had a fall on 03/06/2023. Imaging during ED visit was unremarkable. Pt states since the fall she has had some left lower back pain that radiates into her left upper leg.   Back Pain This is a new problem. The current episode started 1 to 4 weeks ago. The problem has been waxing and waning since onset. The pain is present in the lumbar spine. The pain radiates to the left thigh. The pain is mild. The symptoms are aggravated by position, twisting, standing and sitting. Associated symptoms include leg pain and tingling. Pertinent negatives include no abdominal pain, bladder incontinence, bowel incontinence, chest pain, dysuria, fever, headaches, numbness, paresis, paresthesias, pelvic pain, perianal numbness, weakness or weight loss. She has tried analgesics for the symptoms. The treatment provided moderate relief.       Relevant past medical, surgical, family, and social history reviewed and updated as indicated.  Allergies and medications reviewed and updated. Data reviewed: Chart in Epic.   Past Medical History:  Diagnosis Date   Aortic insufficiency    Mild, echo, December, 2009   Arthritis    Carotid artery disease (HCC)    Cataract    Chest pain    Catheterization, 2003, no  significant CAD   Diastolic dysfunction    Mild diastolic dysfunction, echo, December, 2012   DJD (degenerative joint disease)    Dyslipidemia    GERD (gastroesophageal reflux disease)    Hyperlipidemia    Hypertension    Mitral valve prolapse    Osteoporosis    Prediabetes    Thyroid nodule     Past Surgical History:  Procedure Laterality Date   ABDOMINAL HYSTERECTOMY  1964   APPENDECTOMY  1946   BIOPSY THYROID Left    CARDIAC CATHETERIZATION  sept 2003   no significant cad   CATARACT EXTRACTION, BILATERAL     COLONOSCOPY     NASAL SINUS SURGERY     TONSILLECTOMY      Social History   Socioeconomic History   Marital status: Married    Spouse name: Melissa Carpenter Child psychotherapist)   Number of children: 4   Years of education: Not on file   Highest education level: Not on file  Occupational History   Occupation: retired    Associate Professor: [employer]  Tobacco Use   Smoking status: Never   Smokeless tobacco: Never  Vaping Use   Vaping status: Never Used  Substance and Sexual Activity   Alcohol use: No    Alcohol/week: 0.0 standard drinks of alcohol   Drug use: No   Sexual activity: Never  Other Topics Concern   Not on file  Social History Narrative   Lives in 2 story home with her husband   Children live nearby   Social Determinants of Health   Financial  Resource Strain: Low Risk  (05/10/2021)   Overall Financial Resource Strain (CARDIA)    Difficulty of Paying Living Expenses: Not very hard  Food Insecurity: No Food Insecurity (05/10/2021)   Hunger Vital Sign    Worried About Running Out of Food in the Last Year: Never true    Ran Out of Food in the Last Year: Never true  Transportation Needs: No Transportation Needs (05/10/2021)   PRAPARE - Administrator, Civil Service (Medical): No    Lack of Transportation (Non-Medical): No  Physical Activity: Insufficiently Active (05/10/2021)   Exercise Vital Sign    Days of Exercise per Week: 7 days    Minutes of  Exercise per Session: 10 min  Stress: No Stress Concern Present (05/10/2021)   [employer] of Occupational Health - Occupational Stress Questionnaire    Feeling of Stress : Not at all  Social Connections: Unknown (12/23/2021)   Received from [facility]   Social Network    Social Network: Not on file  Intimate Partner Violence: Unknown (11/15/2021)   Received from [facility]   HITS    Physically Hurt: Not on file    Insult or Talk Down To: Not on file    Threaten Physical Harm: Not on file    Scream or Curse: Not on file    Outpatient Encounter Medications as of 03/29/2023  Medication Sig   Accu-Chek Softclix Lancets lancets Use to check blood sugars daily   albuterol (VENTOLIN HFA) 108 (90 Base) MCG/ACT inhaler Inhale 2 puffs into the lungs every 6 (six) hours as needed for wheezing or shortness of breath.   amLODipine-atorvastatin (CADUET) 10-40 MG tablet Take 1 tablet by mouth daily.   aspirin 81 MG tablet Take 81 mg by mouth daily.   Calcium Carbonate-Vitamin D (CALTRATE 600+D) 600-400 MG-UNIT per tablet Take 1 tablet by mouth daily.   cholecalciferol (VITAMIN D) 400 UNITS TABS Take 1,000 Units by mouth.   fish oil-omega-3 fatty acids 1000 MG capsule Take 1 g by mouth daily.   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 TAKE WITH OR IMMEDIATELY FOLLOWING A MEAL.   Multiple Vitamin (MULTIVITAMIN) tablet Take 1 tablet by mouth daily.   telmisartan (MICARDIS) 80 MG tablet Take 1 tablet (80 mg total) by mouth every evening.   triamcinolone cream (KENALOG) 0.1 % APPLY TO AFFECTED AREA TWICE A DAY   No facility-administered encounter medications on file as of  03/29/2023.    Allergies  Allergen Reactions   Codeine Hives   Forteo [Parathyroid Hormone (Recomb)] Other (See Comments)    Weakness and calcium increase   Raloxifene Other (See Comments)    Eye problems Other reaction(s): Other (See Comments) Eye problems Eye problems   Morphine Rash   Penicillins Rash   Sulfites Rash   Sulfonamide Derivatives Rash    Review of Systems  Constitutional:  Negative for activity change, appetite change, chills, diaphoresis, fatigue, fever, unexpected weight change and weight loss.  Eyes:  Negative for photophobia and visual disturbance.  Respiratory:  Negative for cough and shortness of breath.   Cardiovascular:  Negative for chest pain, palpitations  and leg swelling.  Gastrointestinal:  Negative for abdominal distention, abdominal pain, anal bleeding, blood in stool, bowel incontinence, constipation, diarrhea, nausea, rectal pain and vomiting.  Endocrine: Negative for polydipsia, polyphagia and polyuria.  Genitourinary:  Negative for bladder incontinence, difficulty urinating, dysuria, pelvic pain and urgency.  Musculoskeletal:  Positive for arthralgias, back pain and myalgias. Negative for gait problem, joint swelling, neck pain and neck stiffness.  Neurological:  Positive for tingling. Negative for dizziness, tremors, seizures, syncope, facial asymmetry, speech difficulty, weakness, light-headedness, numbness, headaches and paresthesias.  Psychiatric/Behavioral:  Negative for confusion.   All other systems reviewed and are negative.       Objective:  BP 130/84   Pulse (!) 59   Temp 97.6 F (36.4 C) (Temporal)   Ht 5\' 1"  (1.549 m)   Wt 130 lb 12.8 oz (59.3 kg)   SpO2 96%   BMI 24.71 kg/m    Wt Readings from Last 3 Encounters:  03/29/23 130 lb 12.8 oz (59.3 kg)  03/06/23 133 lb (60.3 kg)  02/16/23 133 lb (60.3 kg)    Physical Exam Vitals and nursing note reviewed.  Constitutional:      General: She is not in acute distress.     Appearance: Normal appearance. She is normal weight. She is not ill-appearing, toxic-appearing or diaphoretic.  HENT:     Head: Normocephalic and atraumatic.  Cardiovascular:     Rate and Rhythm: Normal rate and regular rhythm.     Heart sounds: Murmur heard.     Systolic murmur is present with a grade of 4/6.  Musculoskeletal:     Cervical back: Normal.     Thoracic back: Normal.     Lumbar back: No swelling, edema, deformity, signs of trauma, lacerations, spasms, tenderness or bony tenderness. Decreased range of motion. Positive left straight leg raise test. Negative right straight leg raise test. No scoliosis.       Back:     Right hip: Normal.     Left hip: Normal.     Right lower leg: No edema.     Left lower leg: No edema.  Neurological:     Mental Status: She is alert.     Results for orders placed or performed in visit on 02/16/23  Bayer DCA Hb A1c Waived  Result Value Ref Range   HB A1C (BAYER DCA - WAIVED) 5.6 4.8 - 5.6 %  Lipid panel  Result Value Ref Range   Cholesterol, Total 119 100 - 199 mg/dL   Triglycerides 295 (H) 0 - 149 mg/dL   HDL 34 (L) >62 mg/dL   VLDL Cholesterol Cal 48 (H) 5 - 40 mg/dL   LDL Chol Calc (NIH) 37 0 - 99 mg/dL   Chol/HDL Ratio 3.5 0.0 - 4.4 ratio  CMP14+EGFR  Result Value Ref Range   Glucose 140 (H) 70 - 99 mg/dL   BUN 19 8 - 27 mg/dL   Creatinine, Ser 1.30 0.57 - 1.00 mg/dL   eGFR 88 >86 VH/QIO/9.62   BUN/Creatinine Ratio 33 (H) 12 - 28   Sodium 140 134 - 144 mmol/L   Potassium 4.1 3.5 - 5.2 mmol/L   Chloride 105 96 - 106 mmol/L   CO2 19 (L) 20 - 29 mmol/L   Calcium 9.4 8.7 - 10.3 mg/dL   Total Protein 6.3 6.0 - 8.5 g/dL   Albumin 4.1 3.7 - 4.7 g/dL   Globulin, Total 2.2 1.5 - 4.5 g/dL   Bilirubin Total 0.2 0.0 - 1.2 mg/dL  Alkaline Phosphatase 118 44 - 121 IU/L   AST 28 0 - 40 IU/L   ALT 24 0 - 32 IU/L       Pertinent labs & imaging results that were available during my care of the patient were reviewed by me and  considered in my medical decision making.  Assessment & Plan:  Naleyah was seen today for er follow up and back pain.  Diagnoses and all orders for this visit:  Fall, initial encounter Lumbar back pain with radiculopathy affecting left lower extremity ED imaging unremarkable. Continued left lower back pain since fall. No new injuries. No red flags concerning for cauda equina syndrome. Will burst with steroids. Continue Tylenol and topicals at home. Report new, worsening, or persistent symptoms.    Continue all other maintenance medications.  Follow up plan: Return if symptoms worsen or fail to improve.   Continue healthy lifestyle choices, including diet (rich in fruits, vegetables, and lean proteins, and low in salt and simple carbohydrates) and exercise (at least 30 minutes of moderate physical activity daily).  Educational handout given for sciatica   The above assessment and management plan was discussed with the patient. The patient verbalized understanding of and has agreed to the management plan. Patient is aware to call the clinic if they develop any new symptoms or if symptoms persist or worsen. Patient is aware when to return to the clinic for a follow-up visit. Patient educated on when it is appropriate to go to the emergency department.   Kari Baars, FNP-C [facility] [phone]

## 2023-03-29 NOTE — Addendum Note (Signed)
Addended by: Lorelee Cover C on: 03/29/2023 09:30 AM   Modules accepted: Orders

## 2023-04-02 ENCOUNTER — Other Ambulatory Visit: Payer: Self-pay | Admitting: Family Medicine

## 2023-04-02 DIAGNOSIS — Z1231 Encounter for screening mammogram for malignant neoplasm of breast: Secondary | ICD-10-CM

## 2023-04-11 ENCOUNTER — Ambulatory Visit
Admission: RE | Admit: 2023-04-11 | Discharge: 2023-04-11 | Disposition: A | Discharge status: Home / Self Care | Payer: Medicare Other | Source: Ambulatory Visit | Attending: Family Medicine | Admitting: Family Medicine

## 2023-04-11 DIAGNOSIS — Z1231 Encounter for screening mammogram for malignant neoplasm of breast: Secondary | ICD-10-CM

## 2023-04-27 ENCOUNTER — Other Ambulatory Visit: Payer: Self-pay | Admitting: Family Medicine

## 2023-05-14 ENCOUNTER — Ambulatory Visit (INDEPENDENT_AMBULATORY_CARE_PROVIDER_SITE_OTHER): Payer: Medicare Other

## 2023-05-14 VITALS — Ht 60.0 in | Wt 130.0 lb

## 2023-05-14 DIAGNOSIS — Z0000 Encounter for general adult medical examination without abnormal findings: Secondary | ICD-10-CM | POA: Diagnosis not present

## 2023-05-14 NOTE — Progress Notes (Signed)
Subjective:   Melissa Carpenter is a 89 y.o. female who presents for Medicare Annual (Subsequent) preventive examination.  Visit Complete: Virtual  I connected with  Melissa Carpenter on 05/14/23 by a audio enabled telemedicine application and verified that I am speaking with the correct person using two identifiers.  Patient Location: Home  Provider Location: Home Office  I discussed the limitations of evaluation and management by telemedicine. The patient expressed understanding and agreed to proceed.  Patient Medicare AWV questionnaire was completed by the patient on 05/14/2023; I have confirmed that all information answered by patient is correct and no changes since this date.  Cardiac Risk Factors include: advanced age (>71men, >68 women);dyslipidemia;hypertensionBecause this visit was a virtual/telehealth visit, some criteria may be missing or patient reported. Any vitals not documented were not able to be obtained and vitals that have been documented are patient reported.       Objective:    Today's Vitals   05/14/23 0925  Weight: 130 lb (59 kg)  Height: 5' (1.524 m)   Body mass index is 25.39 kg/m.     05/14/2023    9:28 AM 03/06/2023   11:38 AM 05/11/2022    9:08 AM 11/19/2021    1:01 PM 05/10/2021    9:10 AM 06/11/2018   11:11 AM 03/08/2016    9:38 AM  Advanced Directives  Does Patient Have a Medical Advance Directive? Yes Yes No No;Yes No;Yes Yes Yes  Type of Estate agent of [place];Living will Living will  Healthcare Power of [place];Living will Healthcare Power of [place];Living will Healthcare Power of [place];Living will Healthcare Power of [place];Living will  Does patient want to make changes to medical advance directive? No - Patient declined     No - Patient declined No - Patient declined  Copy of Healthcare Power of Attorney in Chart? Yes - validated most recent copy scanned in chart (See row information)    Yes - validated most recent copy  scanned in chart (See row information) No - copy requested Yes  Would patient like information on creating a medical advance directive?   No - Patient declined        Current Medications (verified) Outpatient Encounter Medications as of 05/14/2023  Medication Sig   Accu-Chek Softclix Lancets lancets Use to check blood sugars daily   albuterol (VENTOLIN HFA) 108 (90 Base) MCG/ACT inhaler Inhale 2 puffs into the lungs every 6 (six) hours as needed for wheezing or shortness of breath.   amLODipine-atorvastatin (CADUET) 10-40 MG tablet Take 1 tablet by mouth daily.   aspirin 81 MG tablet Take 81 mg by mouth daily.   Calcium Carbonate-Vitamin D (CALTRATE 600+D) 600-400 MG-UNIT per tablet Take 1 tablet by mouth daily.   cholecalciferol (VITAMIN D) 400 UNITS TABS Take 1,000 Units by mouth.   fish oil-omega-3 fatty acids 1000 MG capsule Take 1 g by mouth daily.   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 TAKE WITH OR IMMEDIATELY FOLLOWING A MEAL.   Multiple Vitamin (MULTIVITAMIN) tablet Take 1 tablet by mouth daily.   telmisartan (MICARDIS) 80 MG tablet Take 1 tablet (80 mg total)  by mouth every evening.   triamcinolone cream (KENALOG) 0.1 % APPLY TO AFFECTED AREA TWICE A DAY   No facility-administered encounter medications on file as of 05/14/2023.    Allergies (verified) Codeine, Forteo [parathyroid hormone (recomb)], Raloxifene, Morphine, Penicillins, Sulfites, and Sulfonamide derivatives   History: Past Medical History:  Diagnosis Date   Aortic insufficiency    Mild, echo, December, 2009   Arthritis    Carotid artery disease (HCC)    Cataract    Chest pain     Catheterization, 2003, no significant CAD   Diastolic dysfunction    Mild diastolic dysfunction, echo, December, 2012   DJD (degenerative joint disease)    Dyslipidemia    GERD (gastroesophageal reflux disease)    Hyperlipidemia    Hypertension    Mitral valve prolapse    Osteoporosis    Prediabetes    Thyroid nodule    Past Surgical History:  Procedure Laterality Date   ABDOMINAL HYSTERECTOMY  1964   APPENDECTOMY  1946   BIOPSY THYROID Left    CARDIAC CATHETERIZATION  sept 2003   no significant cad   CATARACT EXTRACTION, BILATERAL     COLONOSCOPY     NASAL SINUS SURGERY     TONSILLECTOMY     Family History  Problem Relation Age of Onset   Heart attack Mother    Hypertension Mother    Throat cancer Mother        THROAT / VOCAL CORD   Heart disease Mother    Hypertension Father    Heart disease Father    Kidney disease Father        failure   Bone cancer Sister    Stroke Sister    Bone cancer Grandchild        in rib cage   Hypertension Sister    Metabolic syndrome Sister        pre diabetes   GER disease Sister    Heart disease Sister        CAD   Heart attack Brother    Cirrhosis Brother    Breast cancer Sister    Bone cancer Sister    Hyperlipidemia Sister    Heart attack Sister    Stroke Son    Colitis Neg Hx    Esophageal cancer Neg Hx    Stomach cancer Neg Hx    Rectal cancer Neg Hx    Social History   Socioeconomic History   Marital status: Married    Spouse name: Dayton Scrape Child psychotherapist)   Number of children: 4   Years of education: Not on file   Highest education level: Not on file  Occupational History   Occupation: retired    Associate Professor: [employer]  Tobacco Use   Smoking status: Never   Smokeless tobacco: Never  Vaping Use   Vaping status: Never Used  Substance and Sexual Activity   Alcohol use: No    Alcohol/week: 0.0 standard drinks of alcohol   Drug use: No   Sexual activity: Never  Other Topics Concern   Not on file  Social  History Narrative   Lives in 2 story home with her husband   Children live nearby   Social Determinants of Health   Financial Resource Strain: Low Risk  (05/14/2023)   Overall Financial Resource Strain (CARDIA)    Difficulty of Paying Living Expenses: Not hard at all  Food Insecurity: No Food Insecurity (05/14/2023)   Hunger Vital Sign    Worried About Running Out  of Food in the Last Year: Never true    Ran Out of Food in the Last Year: Never true  Transportation Needs: No Transportation Needs (05/14/2023)   PRAPARE - Administrator, Civil Service (Medical): No    Lack of Transportation (Non-Medical): No  Physical Activity: Inactive (05/14/2023)   Exercise Vital Sign    Days of Exercise per Week: 0 days    Minutes of Exercise per Session: 0 min  Stress: No Stress Concern Present (05/14/2023)   [employer] of Occupational Health - Occupational Stress Questionnaire    Feeling of Stress : Not at all  Social Connections: Moderately Integrated (05/14/2023)   Social Connection and Isolation Panel [NHANES]    Frequency of Communication with Friends and Family: More than three times a week    Frequency of Social Gatherings with Friends and Family: More than three times a week    Attends Religious Services: 1 to 4 times per year    Active Member of [employer] or Organizations: No    Attends Engineer, structural: Never    Marital Status: Married    Tobacco Counseling Counseling given: Not Answered   Clinical Intake:  Pre-visit preparation completed: Yes  Pain : No/denies pain     Nutritional Risks: None Diabetes: No  How often do you need to have someone help you when you read instructions, pamphlets, or other written materials from your doctor or pharmacy?: 1 - Never  Interpreter Needed?: No  Information entered by :: Renie Ora, LPN   Activities of Daily Living    05/14/2023    9:28 AM  In your present state of health, do you have any difficulty  performing the following activities:  Hearing? 0  Vision? 0  Difficulty concentrating or making decisions? 0  Walking or climbing stairs? 0  Dressing or bathing? 0  Doing errands, shopping? 0  Preparing Food and eating ? N  Using the Toilet? N  In the past six months, have you accidently leaked urine? N  Do you have problems with loss of bowel control? N  Managing your Medications? N  Managing your Finances? N  Housekeeping or managing your Housekeeping? N    Patient Care Team: [name], [name], MD as PCP - General (Family Medicine) Durene Romans, MD as Consulting Physician (Orthopedic Surgery) Rollene Rotunda, MD as Consulting Physician (Cardiology) Chuck Hint, MD as Consulting Physician (Vascular Surgery)  Indicate any recent Medical Services you may have received from other than [facility] providers in the past year (date may be approximate).     Assessment:   This is a routine wellness examination for [facility].  Hearing/Vision screen Vision Screening - Comments:: Wears rx glasses - up to date with routine eye exams with  Dr.Groat    Goals Addressed             This Visit's Progress    DIET - INCREASE WATER INTAKE         Depression Screen    05/14/2023    9:27 AM 03/29/2023    9:17 AM 02/16/2023    1:02 PM 09/22/2022    9:53 AM 05/22/2022   10:51 AM 05/11/2022    9:13 AM 03/03/2022    9:28 AM  PHQ 2/9 Scores  PHQ - 2 Score 0   0  0 0  PHQ- 9 Score       0  Exception Documentation  Patient refusal Patient refusal  Patient refusal  Fall Risk    05/14/2023    9:26 AM 09/22/2022    9:53 AM 05/11/2022    9:13 AM 03/03/2022    9:28 AM 11/30/2021    9:23 AM  Fall Risk   Falls in the past year? 1 0 0 0 0  Number falls in past yr: 1      Injury with Fall? 1      Risk for fall due to : History of fall(s);Impaired balance/gait;Orthopedic patient      Follow up Education provided;Falls prevention discussed;Falls evaluation completed  Falls evaluation completed       MEDICARE RISK AT HOME: Medicare Risk at Home Any stairs in or around the home?: Yes If so, are there any without handrails?: No Home free of loose throw rugs in walkways, pet beds, electrical cords, etc?: Yes Adequate lighting in your home to reduce risk of falls?: Yes Life alert?: No Use of a cane, walker or w/c?: No Grab bars in the bathroom?: Yes Shower chair or bench in shower?: Yes Elevated toilet seat or a handicapped toilet?: Yes  TIMED UP AND GO:  Was the test performed?  No    Cognitive Function:    06/11/2018   11:13 AM 07/13/2016    3:33 PM 03/08/2016   10:08 AM 03/08/2015    8:37 AM  MMSE - Mini Mental State Exam  Orientation to time 5 5 5 5   Orientation to Place 5 5 5 5   Registration 3 3 3 3   Attention/ Calculation 5 5 5 5   Recall 3 3 3 3   Language- name 2 objects 2 2 2 2   Language- repeat 1 1 1 1   Language- follow 3 step command 3 3 3 3   Language- read & follow direction 1 1 1 1   Write a sentence 1 1 1 1   Copy design 1 1 1 1   Total score 30 30 30 30         05/14/2023    9:29 AM 05/11/2022    9:09 AM  6CIT Screen  What Year? 0 points 0 points  What month? 0 points 0 points  What time? 0 points 0 points  Count back from 20 0 points 2 points  Months in reverse 0 points 0 points  Repeat phrase 0 points 0 points  Total Score 0 points 2 points    Immunizations Immunization History  Administered Date(s) Administered   Fluad Quad(high Dose 65+) 05/29/2019, 05/20/2020, 05/18/2021, 05/22/2022   Influenza, High Dose Seasonal PF 05/08/2016, 06/12/2017, 05/28/2018   Influenza,inj,Quad PF,6+ Mos 05/26/2013, 05/25/2014, 06/08/2015   Moderna Covid-19 Fall Seasonal Vaccine 68yrs & older 05/03/2023   Moderna Sars-Covid-2 Vaccination 10/08/2019, 11/05/2019, 06/29/2020, 04/12/2021   Pneumococcal Conjugate-13 07/30/2013   Pneumococcal Polysaccharide-23 09/14/2004   Tdap 05/15/2011, 11/21/2022   Zoster Recombinant(Shingrix) 02/09/2021, 06/17/2021    TDAP  status: Up to date  Flu Vaccine status: Up to date  Pneumococcal vaccine status: Up to date  Covid-19 vaccine status: Completed vaccines  Qualifies for Shingles Vaccine? Yes   Zostavax completed Yes   Shingrix Completed?: Yes  Screening Tests Health Maintenance  Topic Date Due   INFLUENZA VACCINE  03/15/2023   COVID-19 Vaccine (6 - 2023-24 season) 09/02/2023   DEXA SCAN  12/17/2023   MAMMOGRAM  04/10/2024   Medicare Annual Wellness (AWV)  05/13/2024   DTaP/Tdap/Td (3 - Td or Tdap) 11/20/2032   Pneumonia Vaccine 70+ Years old  Completed   Zoster Vaccines- Shingrix  Completed   HPV VACCINES  Aged  Out    Health Maintenance  Health Maintenance Due  Topic Date Due   INFLUENZA VACCINE  03/15/2023    Colorectal cancer screening: No longer required.   Mammogram status: No longer required due to age.  Bone Density status: Completed 12/16/2021. Results reflect: Bone density results: OSTEOPOROSIS. Repeat every 2 years.  Lung Cancer Screening: (Low Dose CT Chest recommended if Age 65-80 years, 20 pack-year currently smoking OR have quit w/in 15years.) does not qualify.   Lung Cancer Screening Referral: n/a  Additional Screening:  Hepatitis C Screening: does not qualify;  Vision Screening: Recommended annual ophthalmology exams for early detection of glaucoma and other disorders of the eye. Is the patient up to date with their annual eye exam?  Yes  Who is the provider or what is the name of the office in which the patient attends annual eye exams? Dr.groat  If pt is not established with a provider, would they like to be referred to a provider to establish care? No .   Dental Screening: Recommended annual dental exams for proper oral hygiene    Community Resource Referral / Chronic Care Management: CRR required this visit?  No   CCM required this visit?  No     Plan:     I have personally reviewed and noted the following in the patient's chart:   Medical and  social history Use of alcohol, tobacco or illicit drugs  Current medications and supplements including opioid prescriptions. Patient is not currently taking opioid prescriptions. Functional ability and status Nutritional status Physical activity Advanced directives List of other physicians Hospitalizations, surgeries, and ER visits in previous 12 months Vitals Screenings to include cognitive, depression, and falls Referrals and appointments  In addition, I have reviewed and discussed with patient certain preventive protocols, quality metrics, and best practice recommendations. A written personalized care plan for preventive services as well as general preventive health recommendations were provided to patient.     Lorrene Reid, LPN   1/61/0960   After Visit Summary: (MyChart) Due to this being a telephonic visit, the after visit summary with patients personalized plan was offered to patient via MyChart   Nurse Notes: none

## 2023-05-14 NOTE — Patient Instructions (Signed)
Ms. Mullen , Thank you for taking time to come for your Medicare Wellness Visit. I appreciate your ongoing commitment to your health goals. Please review the following plan we discussed and let me know if I can assist you in the future.   Referrals/Orders/Follow-Ups/Clinician Recommendations: Aim for 30 minutes of exercise or brisk walking, 6-8 glasses of water, and 5 servings of fruits and vegetables each day.   This is a list of the screening recommended for you and due dates:  Health Maintenance  Topic Date Due   Flu Shot  03/15/2023   COVID-19 Vaccine (6 - 2023-24 season) 09/02/2023   DEXA scan (bone density measurement)  12/17/2023   Mammogram  04/10/2024   Medicare Annual Wellness Visit  05/13/2024   DTaP/Tdap/Td vaccine (3 - Td or Tdap) 11/20/2032   Pneumonia Vaccine  Completed   Zoster (Shingles) Vaccine  Completed   HPV Vaccine  Aged Out    Advanced directives: (In Chart) A copy of your advanced directives are scanned into your chart should your provider ever need it.  Next Medicare Annual Wellness Visit scheduled for next year: Yes  Insert Preventive Care attachment Insert FALL PREVENTION attachment if needed

## 2023-06-18 ENCOUNTER — Other Ambulatory Visit: Payer: Medicare Other

## 2023-06-18 DIAGNOSIS — E785 Hyperlipidemia, unspecified: Secondary | ICD-10-CM

## 2023-06-18 DIAGNOSIS — I10 Essential (primary) hypertension: Secondary | ICD-10-CM

## 2023-06-18 DIAGNOSIS — R7303 Prediabetes: Secondary | ICD-10-CM

## 2023-06-18 DIAGNOSIS — E041 Nontoxic single thyroid nodule: Secondary | ICD-10-CM | POA: Diagnosis not present

## 2023-06-18 LAB — BAYER DCA HB A1C WAIVED: HB A1C (BAYER DCA - WAIVED): 5.6 % (ref 4.8–5.6)

## 2023-06-19 LAB — CMP14+EGFR
ALT: 27 [IU]/L (ref 0–32)
AST: 33 [IU]/L (ref 0–40)
Albumin: 4.5 g/dL (ref 3.7–4.7)
Alkaline Phosphatase: 116 [IU]/L (ref 44–121)
BUN/Creatinine Ratio: 30 — ABNORMAL HIGH (ref 12–28)
BUN: 16 mg/dL (ref 8–27)
Bilirubin Total: 0.3 mg/dL (ref 0.0–1.2)
CO2: 23 mmol/L (ref 20–29)
Calcium: 9.6 mg/dL (ref 8.7–10.3)
Chloride: 106 mmol/L (ref 96–106)
Creatinine, Ser: 0.54 mg/dL — ABNORMAL LOW (ref 0.57–1.00)
Globulin, Total: 2.6 g/dL (ref 1.5–4.5)
Glucose: 97 mg/dL (ref 70–99)
Potassium: 4.1 mmol/L (ref 3.5–5.2)
Sodium: 143 mmol/L (ref 134–144)
Total Protein: 7.1 g/dL (ref 6.0–8.5)
eGFR: 89 mL/min/{1.73_m2} (ref >59)

## 2023-06-19 LAB — CBC WITH DIFFERENTIAL/PLATELET
Basophils Absolute: 0 10*3/uL (ref 0.0–0.2)
Basos: 1 %
EOS (ABSOLUTE): 0.2 10*3/uL (ref 0.0–0.4)
Eos: 4 %
Hematocrit: 37 % (ref 34.0–46.6)
Hemoglobin: 11.9 g/dL (ref 11.1–15.9)
Immature Grans (Abs): 0 10*3/uL (ref 0.0–0.1)
Immature Granulocytes: 0 %
Lymphocytes Absolute: 1.2 10*3/uL (ref 0.7–3.1)
Lymphs: 26 %
MCH: 30.7 pg (ref 26.6–33.0)
MCHC: 32.2 g/dL (ref 31.5–35.7)
MCV: 96 fL (ref 79–97)
Monocytes Absolute: 0.6 10*3/uL (ref 0.1–0.9)
Monocytes: 13 %
Neutrophils Absolute: 2.6 10*3/uL (ref 1.4–7.0)
Neutrophils: 56 %
Platelets: 207 10*3/uL (ref 150–450)
RBC: 3.87 x10E6/uL (ref 3.77–5.28)
RDW: 12.4 % (ref 11.7–15.4)
WBC: 4.7 10*3/uL (ref 3.4–10.8)

## 2023-06-19 LAB — LIPID PANEL
Chol/HDL Ratio: 2.7 ratio (ref 0.0–4.4)
Cholesterol, Total: 131 mg/dL (ref 100–199)
HDL: 49 mg/dL (ref >39)
LDL Chol Calc (NIH): 65 mg/dL (ref 0–99)
Triglycerides: 86 mg/dL (ref 0–149)
VLDL Cholesterol Cal: 17 mg/dL (ref 5–40)

## 2023-06-19 LAB — TSH: TSH: 1.38 u[IU]/mL (ref 0.450–4.500)

## 2023-06-21 ENCOUNTER — Ambulatory Visit: Payer: Medicare Other | Admitting: Family Medicine

## 2023-06-21 ENCOUNTER — Encounter: Payer: Self-pay | Admitting: Family Medicine

## 2023-06-21 VITALS — BP 126/50 | HR 58 | Ht 60.0 in | Wt 131.0 lb

## 2023-06-21 DIAGNOSIS — I10 Essential (primary) hypertension: Secondary | ICD-10-CM | POA: Diagnosis not present

## 2023-06-21 DIAGNOSIS — R7303 Prediabetes: Secondary | ICD-10-CM

## 2023-06-21 DIAGNOSIS — Z23 Encounter for immunization: Secondary | ICD-10-CM | POA: Diagnosis not present

## 2023-06-21 DIAGNOSIS — E785 Hyperlipidemia, unspecified: Secondary | ICD-10-CM | POA: Diagnosis not present

## 2023-06-21 MED ORDER — AMLODIPINE-ATORVASTATIN 10-40 MG PO TABS
1.0000 | ORAL_TABLET | Freq: Every day | ORAL | 3 refills | Status: DC
Start: 2023-06-21 — End: 2024-06-25

## 2023-06-21 MED ORDER — METOPROLOL SUCCINATE ER 100 MG PO TB24
100.0000 mg | ORAL_TABLET | Freq: Every day | ORAL | 3 refills | Status: DC
Start: 2023-06-21 — End: 2024-06-25

## 2023-06-21 NOTE — Progress Notes (Signed)
BP (!) 126/50   Pulse (!) 58   Ht 5' (1.524 m)   Wt 131 lb (59.4 kg)   SpO2 97%   BMI 25.58 kg/m    Subjective:   Patient ID: Melissa Carpenter, female    DOB: 01-03-1934, 89 y.o.   MRN: [id number]  HPI: Melissa Carpenter is a 89 y.o. female presenting on 06/21/2023 for Medical Management of Chronic Issues, Hyperlipidemia, Hypertension, and Prediabetes   HPI Prediabetes Patient comes in today for recheck of his diabetes. Patient has been currently taking no medicine, has been diet controlled. Patient is currently on an ACE inhibitor/ARB. Patient has seen an ophthalmologist this year. Patient denies any new issues with their feet. The symptom started onset as an adult hypertension and hyperlipidemia ARE RELATED TO DM   Hypertension Patient is currently on amlodipine and metoprolol and telmisartan and hydralazine, and their blood pressure today is 126/50. Patient denies any lightheadedness or dizziness. Patient denies headaches, blurred vision, chest pains, shortness of breath, or weakness. Denies any side effects from medication and is content with current medication.   Hyperlipidemia Patient is coming in for recheck of his hyperlipidemia. The patient is currently taking atorvastatin and fish oils. They deny any issues with myalgias or history of liver damage from it. They deny any focal numbness or weakness or chest pain.   Relevant past medical, surgical, family and social history reviewed and updated as indicated. Interim medical history since our last visit reviewed. Allergies and medications reviewed and updated.  Review of Systems  Constitutional:  Negative for chills and fever.  HENT:  Negative for congestion, ear discharge and ear pain.   Eyes:  Negative for redness and visual disturbance.  Respiratory:  Negative for chest tightness and shortness of breath.   Cardiovascular:  Negative for chest pain and leg swelling.  Genitourinary:  Negative for difficulty urinating and dysuria.   Musculoskeletal:  Negative for back pain and gait problem.  Skin:  Negative for rash.  Neurological:  Negative for light-headedness and headaches.  Psychiatric/Behavioral:  Negative for agitation and behavioral problems.   All other systems reviewed and are negative.   Per HPI unless specifically indicated above   Allergies as of 06/21/2023       Reactions   Codeine Hives   Forteo [parathyroid Hormone (recomb)] Other (See Comments)   Weakness and calcium increase   Raloxifene Other (See Comments)   Eye problems Other reaction(s): Other (See Comments) Eye problems Eye problems   Morphine Rash   Penicillins Rash   Sulfites Rash   Sulfonamide Derivatives Rash        Medication List        Accurate as of June 21, 2023 11:21 AM. If you have any questions, ask your nurse or doctor.          Accu-Chek Softclix Lancets lancets Use to check blood sugars daily   albuterol 108 (90 Base) MCG/ACT inhaler Commonly known as: VENTOLIN HFA Inhale 2 puffs into the lungs every 6 (six) hours as needed for wheezing or shortness of breath.   amLODipine-atorvastatin 10-40 MG tablet Commonly known as: CADUET Take 1 tablet by mouth daily.   aspirin 81 MG tablet Take 81 mg by mouth daily.   Calcium Carbonate-Vitamin D 600-400 MG-UNIT tablet Commonly known as: Caltrate 600+D Take 1 tablet by mouth daily.   cholecalciferol 10 MCG (400 UNIT) Tabs tablet Commonly known as: VITAMIN D3 Take 1,000 Units by mouth.   fish oil-omega-3 fatty acids 1000 MG  capsule Take 1 g by mouth daily.   fluticasone 50 MCG/ACT nasal spray Commonly known as: FLONASE SPRAY 2 SPRAYS INTO EACH NOSTRIL EVERY DAY   glucose blood test strip Check blood glucose QD and PRN DxE11.9   hydrALAZINE 25 MG tablet Commonly known as: APRESOLINE Take 25 mg morning, 25 mg at lunch, and 50 mg ( 2 tablets ) at bedtime   metoprolol succinate 100 MG 24 hr tablet Commonly known as: TOPROL-XL Take 1 tablet (100  mg total) by mouth daily. TAKE WITH OR IMMEDIATELY FOLLOWING A MEAL.   multivitamin tablet Take 1 tablet by mouth daily.   telmisartan 80 MG tablet Commonly known as: MICARDIS Take 1 tablet (80 mg total) by mouth every evening.   triamcinolone cream 0.1 % Commonly known as: KENALOG APPLY TO AFFECTED AREA TWICE A DAY         Objective:   BP (!) 126/50   Pulse (!) 58   Ht 5' (1.524 m)   Wt 131 lb (59.4 kg)   SpO2 97%   BMI 25.58 kg/m   Wt Readings from Last 3 Encounters:  06/21/23 131 lb (59.4 kg)  05/14/23 130 lb (59 kg)  03/29/23 130 lb 12.8 oz (59.3 kg)    Physical Exam Vitals and nursing note reviewed.  Constitutional:      General: She is not in acute distress.    Appearance: She is well-developed. She is not diaphoretic.  Eyes:     Conjunctiva/sclera: Conjunctivae normal.  Cardiovascular:     Rate and Rhythm: Normal rate and regular rhythm.     Heart sounds: Murmur (4/6 Holosystolic murmur best heard at right second intercostal space) heard.  Pulmonary:     Effort: Pulmonary effort is normal. No respiratory distress.     Breath sounds: Normal breath sounds. No wheezing.  Musculoskeletal:        General: No tenderness. Normal range of motion.  Skin:    General: Skin is warm and dry.     Findings: No rash.  Neurological:     Mental Status: She is alert and oriented to person, place, and time.     Coordination: Coordination normal.  Psychiatric:        Behavior: Behavior normal.     Results for orders placed or performed in visit on 06/18/23  Bayer DCA Hb A1c Waived  Result Value Ref Range   HB A1C (BAYER DCA - WAIVED) 5.6 4.8 - 5.6 %  TSH  Result Value Ref Range   TSH 1.380 0.450 - 4.500 uIU/mL  CBC with Differential/Platelet  Result Value Ref Range   WBC 4.7 3.4 - 10.8 x10E3/uL   RBC 3.87 3.77 - 5.28 x10E6/uL   Hemoglobin 11.9 11.1 - 15.9 g/dL   Hematocrit 76.2 83.1 - 46.6 %   MCV 96 79 - 97 fL   MCH 30.7 26.6 - 33.0 pg   MCHC 32.2 31.5 -  35.7 g/dL   RDW 51.7 61.6 - 07.3 %   Platelets 207 150 - 450 x10E3/uL   Neutrophils 56 Not Estab. %   Lymphs 26 Not Estab. %   Monocytes 13 Not Estab. %   Eos 4 Not Estab. %   Basos 1 Not Estab. %   Neutrophils Absolute 2.6 1.4 - 7.0 x10E3/uL   Lymphocytes Absolute 1.2 0.7 - 3.1 x10E3/uL   Monocytes Absolute 0.6 0.1 - 0.9 x10E3/uL   EOS (ABSOLUTE) 0.2 0.0 - 0.4 x10E3/uL   Basophils Absolute 0.0 0.0 - 0.2 x10E3/uL  Immature Granulocytes 0 Not Estab. %   Immature Grans (Abs) 0.0 0.0 - 0.1 x10E3/uL  CMP14+EGFR  Result Value Ref Range   Glucose 97 70 - 99 mg/dL   BUN 16 8 - 27 mg/dL   Creatinine, Ser 8.11 (L) 0.57 - 1.00 mg/dL   eGFR 89 >91 YN/WGN/5.62   BUN/Creatinine Ratio 30 (H) 12 - 28   Sodium 143 134 - 144 mmol/L   Potassium 4.1 3.5 - 5.2 mmol/L   Chloride 106 96 - 106 mmol/L   CO2 23 20 - 29 mmol/L   Calcium 9.6 8.7 - 10.3 mg/dL   Total Protein 7.1 6.0 - 8.5 g/dL   Albumin 4.5 3.7 - 4.7 g/dL   Globulin, Total 2.6 1.5 - 4.5 g/dL   Bilirubin Total 0.3 0.0 - 1.2 mg/dL   Alkaline Phosphatase 116 44 - 121 IU/L   AST 33 0 - 40 IU/L   ALT 27 0 - 32 IU/L  Lipid panel  Result Value Ref Range   Cholesterol, Total 131 100 - 199 mg/dL   Triglycerides 86 0 - 149 mg/dL   HDL 49 >13 mg/dL   VLDL Cholesterol Cal 17 5 - 40 mg/dL   LDL Chol Calc (NIH) 65 0 - 99 mg/dL   Chol/HDL Ratio 2.7 0.0 - 4.4 ratio    Assessment & Plan:   Problem List Items Addressed This Visit       Cardiovascular and Mediastinum   Essential hypertension (Chronic)   Relevant Medications   amLODipine-atorvastatin (CADUET) 10-40 MG tablet   metoprolol succinate (TOPROL-XL) 100 MG 24 hr tablet     Other   Dyslipidemia (Chronic)   Relevant Medications   amLODipine-atorvastatin (CADUET) 10-40 MG tablet   Pre-diabetes   Other Visit Diagnoses     Encounter for immunization    -  Primary   Relevant Orders   Flu Vaccine Trivalent High Dose (Fluad) (Completed)       Blood work looks good and  everything looks good.  Cholesterol even looked a lot better and her blood pressure is good.  No changes, see back in 4 months Follow up plan: Return in about 4 months (around 10/19/2023), or if symptoms worsen or fail to improve, for Prediabetes hypertension hyperlipidemia.  Counseling provided for all of the vaccine components Orders Placed This Encounter  Procedures   Flu Vaccine Trivalent High Dose (Fluad)    Arville Care, MD [facility] 06/21/2023, 11:21 AM

## 2023-07-31 ENCOUNTER — Ambulatory Visit (HOSPITAL_COMMUNITY)
Admission: RE | Admit: 2023-07-31 | Discharge: 2023-07-31 | Disposition: A | Discharge status: Home / Self Care | Payer: Medicare Other | Source: Ambulatory Visit | Attending: Cardiology | Admitting: Cardiology

## 2023-07-31 DIAGNOSIS — R002 Palpitations: Secondary | ICD-10-CM | POA: Insufficient documentation

## 2023-07-31 DIAGNOSIS — I10 Essential (primary) hypertension: Secondary | ICD-10-CM | POA: Insufficient documentation

## 2023-07-31 DIAGNOSIS — I350 Nonrheumatic aortic (valve) stenosis: Secondary | ICD-10-CM | POA: Diagnosis not present

## 2023-07-31 LAB — ECHOCARDIOGRAM COMPLETE
AR max vel: 0.89 cm2
AV Area VTI: 0.94 cm2
AV Area mean vel: 0.88 cm2
AV Mean grad: 54 mm[Hg]
AV Peak grad: 92.4 mm[Hg]
Ao pk vel: 4.81 m/s
Area-P 1/2: 2.95 cm2
S' Lateral: 2.1 cm

## 2023-07-31 NOTE — Progress Notes (Signed)
*  PRELIMINARY RESULTS* Echocardiogram 2D Echocardiogram has been performed.  Stacey Drain 07/31/2023, 11:37 AM

## 2023-08-10 ENCOUNTER — Telehealth: Payer: Self-pay

## 2023-08-10 NOTE — Telephone Encounter (Signed)
-----   Message from Rollene Rotunda sent at 08/09/2023  5:45 PM EST ----- Normal EF.  Severe AS.  The valve is getting stiffer and we need to talk about the potential therapies.  She has follow up with me in early January.  Please make sure that she keeps this appt.  Call Melissa Carpenter with the results and send results to [name], [name], MD

## 2023-08-10 NOTE — Telephone Encounter (Signed)
Called and spoke with patient regarding test results and also reminded her to keep upcoming appointment with Dr Antoine Poche in [place] office on 08/22/23 at 1:20pm. Reminded her that Dr Antoine Poche wants to discuss treatment options at that time.

## 2023-08-19 NOTE — Progress Notes (Signed)
 Cardiology Office Note:   Date:  08/22/2023  ID:  Melissa Carpenter, DOB 03-24-1934, MRN [id number] PCP: [name], Fonda LABOR, MD  [facility] Providers Cardiologist:  None {  History of Present Illness:   Melissa Carpenter is a 89 y.o. female who presents for follow up of palpitations and HTN.  Since I last saw her I sent her for a follow-up echo.  It appears that her gradient has gotten worse.  The mean gradient which was previously 36.5 looks to be 54 with a valve area going from 1.27-0.94.  She is reporting episodes of shortness of breath.  She says she is getting dyspneic doing mild chores around her house like the laundry.  She does grocery store and she is not having dyspnea necessarily with that when she tries to carry the groceries.  She is not having PND or orthopnea.  She is not having any new chest pressure, neck or arm discomfort.  She is always heard her heartbeat in her head and this seems to still be going on.  She has occasional palpitations.  She is not having any chest pressure, neck or arm discomfort.  She has had no weight gain or edema.  ROS: As stated in the HPI and negative for all other systems.  Studies Reviewed:    EKG:   EKG Interpretation Date/Time:  Wednesday August 22 2023 13:41:44 EST Ventricular Rate:  59 PR Interval:  150 QRS Duration:  86 QT Interval:  422 QTC Calculation: 417 R Axis:   68  Text Interpretation: Sinus bradycardia Nonspecific ST abnormality When compared with ECG of 19-Nov-2021 13:00, No significant change since last tracing Confirmed by Lavona Agent ([id number]) on 08/22/2023 2:17:06 PM    Risk Assessment/Calculations:              Physical Exam:   VS:  BP 128/60   Pulse (!) 59   Ht 5' (1.524 m)   Wt 130 lb (59 kg)   BMI 25.39 kg/m    Wt Readings from Last 3 Encounters:  08/22/23 130 lb (59 kg)  06/21/23 131 lb (59.4 kg)  05/14/23 130 lb (59 kg)     GEN: Well nourished, well developed in no acute distress NECK: No JVD; No carotid  bruits CARDIAC: RRR, 3 out of 6 apical systolic murmur mid-to-late peaking radiating at the aortic outflow tract, no diastolic murmurs, rubs, gallops RESPIRATORY:  Clear to auscultation without rales, wheezing or rhonchi  ABDOMEN: Soft, non-tender, non-distended EXTREMITIES:  No edema; No deformity   ASSESSMENT AND PLAN:   HTN:   His blood pressure is at target.  No change in therapy.  She is doing well since starting hydralazine  3 times daily.  PALPITATIONS: She said these are not more problematic than before and might be even slightly less.  No change in therapy.   CAROTID STENOSIS:    This has been mild previously.  Carotid Dopplers indicated at this time.   AS:   This was moderate on echo.  Now this has progressed to be severe and she is symptomatic.  She is going to need TAVR if she is a candidate.  I talked to her about this and she would proceed although she was taken aback by my suggestion that she see the physicians fairly soon.  She would agree to this and I have spoken with the Structural Heart team.  They will call her to arrange an office visit.  So she can consider the next steps.  Follow up with me in two months.   Signed, Lynwood Schilling, MD

## 2023-08-22 ENCOUNTER — Ambulatory Visit (INDEPENDENT_AMBULATORY_CARE_PROVIDER_SITE_OTHER): Payer: Medicare Other | Admitting: Cardiology

## 2023-08-22 ENCOUNTER — Encounter: Payer: Self-pay | Admitting: Cardiology

## 2023-08-22 VITALS — BP 128/60 | HR 59 | Ht 60.0 in | Wt 130.0 lb

## 2023-08-22 DIAGNOSIS — I6529 Occlusion and stenosis of unspecified carotid artery: Secondary | ICD-10-CM | POA: Diagnosis not present

## 2023-08-22 DIAGNOSIS — I10 Essential (primary) hypertension: Secondary | ICD-10-CM

## 2023-08-22 DIAGNOSIS — R002 Palpitations: Secondary | ICD-10-CM | POA: Diagnosis not present

## 2023-08-22 NOTE — Patient Instructions (Addendum)
 Medication Instructions:  The current medical regimen is effective;  continue present plan and medications.  *If you need a refill on your cardiac medications before your next appointment, please call your pharmacy*  [facility] will call you for an appointment with one of the Structural Heart doctors.   Follow-Up: At [facility], you and your health needs are our priority.  As part of our continuing mission to provide you with exceptional heart care, we have created designated Provider Care Teams.  These Care Teams include your primary Cardiologist (physician) and Advanced Practice Providers (APPs -  Physician Assistants and Nurse Practitioners) who all work together to provide you with the care you need, when you need it.  We recommend signing up for the patient portal called MyChart.  Sign up information is provided on this After Visit Summary.  MyChart is used to connect with patients for Virtual Visits (Telemedicine).  Patients are able to view lab/test results, encounter notes, upcoming appointments, etc.  Non-urgent messages can be sent to your provider as well.   To learn more about what you can do with MyChart, go to [email or web address].    Your next appointment:   2 month(s)  Provider:   Lynwood Schilling, MD       Aortic Valve Stenosis  Aortic valve stenosis is a narrowing of the aortic valve in the heart. The aortic valve opens and closes to regulate blood flow between the left side of the heart (left ventricle) and the artery that leads away from the heart (aorta). When the aortic valve becomes narrow, it is difficult for the heart to pump blood out to the body, which causes the heart to work harder. The extra work can weaken the heart muscle over time. Aortic valve stenosis can range from mild to severe. If it is not treated, it can become more severe over time and lead to heart failure. What are the causes? This condition may be caused by: Buildup of calcium   around and on the aortic valve. This can occur with aging. This is the most common cause of aortic valve stenosis. A heart problem that developed in the womb (birth defect). Rheumatic fever. Radiation to the chest. What increases the risk? You are more likely to develop this condition if: You are 65 years and older. You were born with an abnormal bicuspid valve. What are the signs or symptoms? You may not have any symptoms until your condition becomes severe. It may take 10-20 years for mild or moderate aortic valve stenosis to become severe. Symptoms may include: Shortness of breath. This may get worse during physical activity. Feeling unusually weak and tired (fatigue). Extreme discomfort in the chest, neck, or arm during physical activity (angina). A heartbeat that is irregular or faster than normal (palpitations). Dizziness or fainting. This may happen when you get tired or after you take certain heart medicines, such as nitroglycerin . How is this diagnosed? This condition may be diagnosed with: A physical exam. Echocardiogram. This is a type of imaging test that uses sound waves (ultrasound) to make images of your heart. There are two kinds of this test that may be used. Transthoracic echocardiogram (TTE). For this type, a wand-like tool (transducer) is moved over your chest to create ultrasound images that are recorded by a computer. Transesophageal echocardiogram (TEE). For this type, a flexible tube (probe) is inserted down the part of the body that moves food from your mouth to your stomach (esophagus). The heart and the esophagus are  close to each other. Your health care provider will use the probe to take clear, detailed pictures of the heart. Cardiac catheterization. For this procedure, a small, thin tube (catheter) is passed through a large vein in your neck, groin, or arm. The catheter is used to get information about arteries, structures, blood pressure, and oxygen levels in your  heart. Exercise stress tests. These are tests that check the blood supply to your heart and your heart's response to exercise. These tests help determine the severity of your condition. Cardiac magnetic resonance imaging (MRI). This test uses magnetic fields and radio waves to create detailed images of your heart. It is used to look at the size of your aorta. Computed tomography, or CT scan. This is a test that uses a series of X-rays and a computer to produce a 3D image of the heart. This test may be used to measure the size of your aorta and look at your aortic valve more closely. You may work with a health care provider who specializes in the heart (cardiologist) for diagnosis and treatment. How is this treated? Treatment depends on how severe your condition is and what your symptoms are. You will need to have your heart checked regularly to make sure that your condition is not getting worse or causing serious problems. Treatment may include: Surgery to replace your aortic valve. This is the most common treatment for aortic valve stenosis, and it is the only treatment to cure the condition. Several types of surgeries are available. The surgery may be done: Through a large incision over your heart (open-heart surgery). Through small incisions, using a flexible tube called a catheter (transcatheter aortic valve replacement, TAVR). Medicines that help to keep your heart rate regular. Medicines that thin your blood (anticoagulants) to prevent blood clots. Antibiotic medicines to help prevent infection. If your condition is mild, you may only need regular follow-up visits for monitoring. Follow these instructions at home: Lifestyle If you drink alcohol: Limit how much you have to: 0-1 drink a day for women. 0-2 drinks a day for men. Know how much alcohol is in your drink. In the [place] one drink equals one 12 oz bottle of beer (355 mL), one 5 oz glass of wine (148 mL), or one 1 oz glass of hard  liquor (44 mL). Do not use any products that contain nicotine or tobacco. These products include cigarettes, chewing tobacco, and vaping devices, such as e-cigarettes. If you need help quitting, ask your health care provider. Work with your health care provider to manage your blood pressure and cholesterol. Maintain a healthy weight. Eating and drinking Follow instructions from your health care provider about eating or drinking restrictions. You may be told to: Eat a heart-healthy diet that includes plenty of fresh fruits and vegetables, whole grains, lean protein, and low-fat or nonfat dairy. Limit how much caffeine you drink. Caffeine can affect your heart's rate and rhythm. Avoid certain foods, including: Foods that are high in salt (sodium), saturated fat, or sugar. Canned or highly processed food. Fried foods.  Activity Exercise regularly. If your aortic valve stenosis is mild, you may only need to avoid very intense physical activity, such as heavy weight lifting. The more severe your aortic valve stenosis is, the more activities you may need to avoid. Return to your normal activities as told by your health care provider. Ask your health care provider what amount and type of physical activity is safe for you. If you are taking blood thinners: Talk  with your health care provider before you take any medicines that contain aspirin  or NSAIDs, such as ibuprofen. These medicines increase your risk for dangerous bleeding. Take your medicine exactly as told, at the same time every day. Avoid activities that could cause injury or bruising. Follow instructions about how to prevent falls. Wear a medical alert bracelet or carry a card that lists what medicines you take. General instructions Take over-the-counter and prescription medicines only as told by your health care provider. If you were prescribed an antibiotic, take it as told by your health care provider. Do not stop taking the antibiotic  even if you start to feel better. If you are a woman and you plan to become pregnant, talk with your health care provider before you become pregnant. Before you have any type of medical or dental procedure or surgery, tell all health care providers that you have aortic valve stenosis. This may affect treatment that you receive. Keep all follow-up visits. This is important. Contact a health care provider if: You have a fever. Get help right away if: You develop any of the following symptoms: Chest pain. Chest tightness. Shortness of breath. You feel light-headed. You feel like you might faint. Your heartbeat is irregular or faster than normal. These symptoms may be an emergency. Get help right away. Call 911. Do not wait to see if the symptoms will go away. Do not drive yourself to the hospital. Summary Aortic valve stenosis is a narrowing of the aortic valve in the heart. The aortic valve regulates blood flow between the left ventricle and the aorta. If it is not treated, aortic valve stenosis can lead to heart failure. Treatment depends on how severe your condition is and what your symptoms are. You will need to have your heart checked regularly to make sure that your condition is not getting worse or causing serious problems. Exercise regularly. Ask your health care provider what amount and type of physical activity is safe for you. This information is not intended to replace advice given to you by your health care provider. Make sure you discuss any questions you have with your health care provider. Document Revised: 05/01/2021 Document Reviewed: 05/01/2021 Elsevier Patient Education  2024 [employer].

## 2023-08-24 ENCOUNTER — Other Ambulatory Visit (HOSPITAL_COMMUNITY): Payer: Medicare Other

## 2023-08-28 ENCOUNTER — Encounter: Payer: Self-pay | Admitting: Cardiovascular Disease

## 2023-08-28 ENCOUNTER — Ambulatory Visit: Payer: Medicare Other | Attending: Cardiovascular Disease | Admitting: Cardiovascular Disease

## 2023-08-28 VITALS — BP 128/64 | HR 58 | Ht 60.0 in | Wt 130.0 lb

## 2023-08-28 DIAGNOSIS — I350 Nonrheumatic aortic (valve) stenosis: Secondary | ICD-10-CM | POA: Insufficient documentation

## 2023-08-28 NOTE — H&P (View-Only) (Signed)
 [facility] Consult Note  Chief Complaint  Patient presents with   New Patient (Initial Visit)   History of Present Illness: 89 yo female with history of HTN, carotid artery disease, borderline DM, hyperlipidemia, GERD and severe aortic stenosis who is here today as a new consult, referred by Dr. Antoine Poche, for further discussion regarding her aortic stenosis and possible TAVR. She has been followed by Dr. Antoine Poche for moderate aortic stenosis. Echo 07/31/23 with LVEF=60-65%. Normal RV function. Trivial MR. Severe aortic stenosis with mean gradient 54 mmHg, AVA 0.88 cm2.   She tells me today that she has been having progressive dyspnea on exertion with mild chores around the house. She has no chest pain, palpitations, near syncope or syncope. No LE edema, orthopnea or PND.  She lives in [place], [place] with her husband. She is retired from the city of [place]. She has no active dental issues. She sees a Education officer, community regularly.   Primary Care Physician: [name], [name], MD Primary Cardiologist: Hochrein Referring Cardiologist: Antoine Poche  Past Medical History:  Diagnosis Date   Aortic insufficiency    Mild, echo, December, 2009   Arthritis    Carotid artery disease (HCC)    Cataract    Chest pain    Catheterization, 2003, no significant CAD   Diastolic dysfunction    Mild diastolic dysfunction, echo, December, 2012   DJD (degenerative joint disease)    Dyslipidemia    GERD (gastroesophageal reflux disease)    Hyperlipidemia    Hypertension    Mitral valve prolapse    Osteoporosis    Prediabetes    Thyroid nodule     Past Surgical History:  Procedure Laterality Date   ABDOMINAL HYSTERECTOMY  1964   APPENDECTOMY  1946   BIOPSY THYROID Left    CARDIAC CATHETERIZATION  sept 2003   no significant cad   CATARACT EXTRACTION, BILATERAL     COLONOSCOPY     NASAL SINUS SURGERY     TONSILLECTOMY      Current Outpatient Medications  Medication Sig Dispense Refill    Accu-Chek Softclix Lancets lancets Use to check blood sugars daily 100 each 3   albuterol (VENTOLIN HFA) 108 (90 Base) MCG/ACT inhaler Inhale 2 puffs into the lungs every 6 (six) hours as needed for wheezing or shortness of breath. 8 g 2   amLODipine-atorvastatin (CADUET) 10-40 MG tablet Take 1 tablet by mouth daily. 90 tablet 3   aspirin 81 MG tablet Take 81 mg by mouth daily.     Calcium Carbonate-Vitamin D (CALTRATE 600+D) 600-400 MG-UNIT per tablet Take 1 tablet by mouth daily.     cholecalciferol (VITAMIN D) 400 UNITS TABS Take 1,000 Units by mouth.     fish oil-omega-3 fatty acids 1000 MG capsule Take 1 g by mouth daily.     fluticasone (FLONASE) 50 MCG/ACT nasal spray SPRAY 2 SPRAYS INTO EACH NOSTRIL EVERY DAY 48 mL 1   glucose blood test strip Check blood glucose QD and PRN DxE11.9 100 each 12   hydrALAZINE (APRESOLINE) 25 MG tablet Take 25 mg morning, 25 mg at lunch, and 50 mg ( 2 tablets ) at bedtime 336 tablet 3   metoprolol succinate (TOPROL-XL) 100 MG 24 hr tablet Take 1 tablet (100 mg total) by mouth daily. TAKE WITH OR IMMEDIATELY FOLLOWING A MEAL. 90 tablet 3   Multiple Vitamin (MULTIVITAMIN) tablet Take 1 tablet by mouth daily.     telmisartan (MICARDIS) 80 MG tablet Take 1 tablet (80 mg total) by  mouth every evening. 90 tablet 3   triamcinolone  cream (KENALOG ) 0.1 % APPLY TO AFFECTED AREA TWICE A DAY 90 g 1   No current facility-administered medications for this visit.    Allergies  Allergen Reactions   Codeine Hives   Forteo [Parathyroid Hormone (Recomb)] Other (See Comments)    Weakness and calcium  increase   Raloxifene Other (See Comments)    Eye problems Other reaction(s): Other (See Comments) Eye problems Eye problems   Morphine Rash   Penicillins Rash   Sulfites Rash   Sulfonamide Derivatives Rash    Social History   Socioeconomic History   Marital status: Married    Spouse name: Ferdie Child Psychotherapist)   Number of children: 4   Years of education: Not on file    Highest education level: Not on file  Occupational History   Occupation: retired    Associate Professor: [employer]  Tobacco Use   Smoking status: Never   Smokeless tobacco: Never  Vaping Use   Vaping status: Never Used  Substance and Sexual Activity   Alcohol use: No    Alcohol/week: 0.0 standard drinks of alcohol   Drug use: No   Sexual activity: Never  Other Topics Concern   Not on file  Social History Narrative   Lives in 2 story home with her husband   Children live nearby   Social Drivers of Health   Financial Resource Strain: Low Risk  (05/14/2023)   Overall Financial Resource Strain (CARDIA)    Difficulty of Paying Living Expenses: Not hard at all  Food Insecurity: No Food Insecurity (05/14/2023)   Hunger Vital Sign    Worried About Running Out of Food in the Last Year: Never true    Ran Out of Food in the Last Year: Never true  Transportation Needs: No Transportation Needs (05/14/2023)   PRAPARE - Administrator, Civil Service (Medical): No    Lack of Transportation (Non-Medical): No  Physical Activity: Inactive (05/14/2023)   Exercise Vital Sign    Days of Exercise per Week: 0 days    Minutes of Exercise per Session: 0 min  Stress: No Stress Concern Present (05/14/2023)   [employer] of Occupational Health - Occupational Stress Questionnaire    Feeling of Stress : Not at all  Social Connections: Moderately Integrated (05/14/2023)   Social Connection and Isolation Panel [NHANES]    Frequency of Communication with Friends and Family: More than three times a week    Frequency of Social Gatherings with Friends and Family: More than three times a week    Attends Religious Services: 1 to 4 times per year    Active Member of [employer] or Organizations: No    Attends Banker Meetings: Never    Marital Status: Married  Catering Manager Violence: Not At Risk (05/14/2023)   Humiliation, Afraid, Rape, and Kick questionnaire    Fear of Current or  Ex-Partner: No    Emotionally Abused: No    Physically Abused: No    Sexually Abused: No    Family History  Problem Relation Age of Onset   Heart attack Mother    Hypertension Mother    Throat cancer Mother        THROAT / VOCAL CORD   Heart disease Mother    Hypertension Father    Heart disease Father    Kidney disease Father        failure   Bone cancer Sister    Stroke Sister  Bone cancer Grandchild        in rib cage   Hypertension Sister    Metabolic syndrome Sister        pre diabetes   GER disease Sister    Heart disease Sister        CAD   Heart attack Brother    Cirrhosis Brother    Breast cancer Sister    Bone cancer Sister    Hyperlipidemia Sister    Heart attack Sister    Stroke Son    Colitis Neg Hx    Esophageal cancer Neg Hx    Stomach cancer Neg Hx    Rectal cancer Neg Hx     Review of Systems:  As stated in the HPI and otherwise negative.   BP 128/64   Pulse (!) 58   Ht 5' (1.524 m)   Wt 59 kg   SpO2 97%   BMI 25.39 kg/m   Physical Examination: General: Well developed, well nourished, NAD  HEENT: OP clear, mucus membranes moist  SKIN: warm, dry. No rashes. Neuro: No focal deficits  Musculoskeletal: Muscle strength 5/5 all ext  Psychiatric: Mood and affect normal  Neck: No JVD, no carotid bruits, no thyromegaly, no lymphadenopathy.  Lungs:Clear bilaterally, no wheezes, rhonci, crackles Cardiovascular: Regular rate and rhythm. Loud, harsh, late peaking systolic murmur.  Abdomen:Soft. Bowel sounds present. Non-tender.  Extremities: No lower extremity edema. Pulses are 2 + in the bilateral DP/PT.  EKG:  EKG is not ordered today. The ekg ordered today demonstrates   Echo 07/31/23:  1. Left ventricular ejection fraction, by estimation, is 60 to 65%. The  left ventricle has normal function. The left ventricle has no regional  wall motion abnormalities. There is moderate asymmetric left ventricular  hypertrophy of the basal-septal   segment. Left ventricular diastolic parameters are consistent with Grade  II diastolic dysfunction (pseudonormalization). Elevated left atrial  pressure.   2. Right ventricular systolic function is normal. The right ventricular  size is normal. Tricuspid regurgitation signal is inadequate for assessing  PA pressure.   3. Left atrial size was moderately dilated.   4. Right atrial size was moderately dilated.   5. The mitral valve is normal in structure. Trivial mitral valve  regurgitation. No evidence of mitral stenosis.   6. The tricuspid valve is abnormal.   7. Severe aortic stenosis. Highest mean gradient is measured with Pedhoff  probe at 62 mmHg. Mean gradient by 2D probe likely underestimated based on  angle of acquisition. . The aortic valve is tricuspid. There is moderate  calcification of the aortic  valve. There is moderate thickening of the aortic valve. Aortic valve  regurgitation is not visualized. Severe aortic valve stenosis. Aortic  valve mean gradient measures 54.0 mmHg. Aortic valve peak gradient  measures 92.4 mmHg. Aortic valve area, by VTI  measures 0.94 cm.   8. The inferior vena cava is normal in size with greater than 50%  respiratory variability, suggesting right atrial pressure of 3 mmHg.   FINDINGS   Left Ventricle: Left ventricular ejection fraction, by estimation, is 60  to 65%. The left ventricle has normal function. The left ventricle has no  regional wall motion abnormalities. The left ventricular internal cavity  size was normal in size. There is   moderate asymmetric left ventricular hypertrophy of the basal-septal  segment. Left ventricular diastolic parameters are consistent with Grade  II diastolic dysfunction (pseudonormalization). Elevated left atrial  pressure.   Right Ventricle: The right ventricular  size is normal. Right vetricular  wall thickness was not well visualized. Right ventricular systolic  function is normal. Tricuspid  regurgitation signal is inadequate for  assessing PA pressure.   Left Atrium: Left atrial size was moderately dilated.   Right Atrium: Right atrial size was moderately dilated.   Pericardium: There is no evidence of pericardial effusion.   Mitral Valve: The mitral valve is normal in structure. There is mild  thickening of the mitral valve leaflet(s). There is mild calcification of  the mitral valve leaflet(s). Mild mitral annular calcification. Trivial  mitral valve regurgitation. No evidence   of mitral valve stenosis.   Tricuspid Valve: The tricuspid valve is abnormal. Tricuspid valve  regurgitation is mild . No evidence of tricuspid stenosis.   Aortic Valve: Severe aortic stenosis. Highest mean gradient is measured  with Pedhoff probe at 62 mmHg. Mean gradient by 2D probe likely  underestimated based on angle of acquisition. The aortic valve is  tricuspid. There is moderate calcification of the  aortic valve. There is moderate thickening of the aortic valve. There is  moderate aortic valve annular calcification. Aortic valve regurgitation is  not visualized. Severe aortic stenosis is present. Aortic valve mean  gradient measures 54.0 mmHg. Aortic  valve peak gradient measures 92.4 mmHg. Aortic valve area, by VTI measures  0.94 cm.   Pulmonic Valve: The pulmonic valve was not well visualized. Pulmonic valve  regurgitation is mild. No evidence of pulmonic stenosis.   Aorta: The aortic root is normal in size and structure.   Venous: The inferior vena cava is normal in size with greater than 50%  respiratory variability, suggesting right atrial pressure of 3 mmHg.   IAS/Shunts: No atrial level shunt detected by color flow Doppler.     LEFT VENTRICLE  PLAX 2D  LVIDd:         3.80 cm   Diastology  LVIDs:         2.10 cm   LV e' medial:    5.33 cm/s  LV PW:         1.00 cm   LV E/e' medial:  18.8  LV IVS:        1.30 cm   LV e' lateral:   8.92 cm/s  LVOT diam:     1.80 cm    LV E/e' lateral: 11.2  LV SV:         113  LV SV Index:   73  LVOT Area:     2.54 cm     RIGHT VENTRICLE  RV S prime:     11.70 cm/s  TAPSE (M-mode): 2.2 cm   LEFT ATRIUM             Index        RIGHT ATRIUM           Index  LA diam:        3.70 cm 2.37 cm/m   RA Area:     21.40 cm  LA Vol (A2C):   71.7 ml 45.98 ml/m  RA Volume:   63.30 ml  40.59 ml/m  LA Vol (A4C):   65.8 ml 42.20 ml/m  LA Biplane Vol: 71.7 ml 45.98 ml/m   AORTIC VALVE  AV Area (Vmax):    0.89 cm  AV Area (Vmean):   0.88 cm  AV Area (VTI):     0.94 cm  AV Vmax:           480.50 cm/s  AV Vmean:  339.000 cm/s  AV VTI:            1.200 m  AV Peak Grad:      92.4 mmHg  AV Mean Grad:      54.0 mmHg  LVOT Vmax:         168.00 cm/s  LVOT Vmean:        117.000 cm/s  LVOT VTI:          0.445 m  LVOT/AV VTI ratio: 0.37    AORTA  Ao Root diam: 3.20 cm   MITRAL VALVE  MV Area (PHT): 2.95 cm     SHUNTS  MV Decel Time: 257 msec     Systemic VTI:  0.44 m  MV E velocity: 100.00 cm/s  Systemic Diam: 1.80 cm  MV A velocity: 75.00 cm/s  MV E/A ratio:  1.33   Recent Labs: 06/18/2023: ALT 27; BUN 16; Creatinine, Ser 0.54; Hemoglobin 11.9; Platelets 207; Potassium 4.1; Sodium 143; TSH 1.380    Wt Readings from Last 3 Encounters:  08/28/23 59 kg  08/22/23 59 kg  06/21/23 59.4 kg    Assessment and Plan:   1. Severe Aortic Valve Stenosis: She has severe aortic valve stenosis. She has NYHA class 2 symptoms. I have personally reviewed the echo images. The aortic valve is thickened and calcified with limited leaflet mobility. I think she would benefit from AVR. Given advanced age, she is not a good candidate for conventional AVR by surgical approach. I think she may be a good candidate for TAVR.   I have reviewed the natural history of aortic stenosis with the patient and their family members  who are present today. We have discussed the limitations of medical therapy and the poor prognosis associated with  symptomatic aortic stenosis. We have reviewed potential treatment options, including palliative medical therapy, conventional surgical aortic valve replacement, and transcatheter aortic valve replacement. We discussed treatment options in the context of the patient's specific comorbid medical conditions.   She would like to proceed with planning for TAVR. I will arrange a right and left heart catheterization at [facility] on 09/05/23 at 10am. Risks and benefits of the cath procedure and the valve procedure are reviewed with the patient. After the cath, she will have a cardiac CT, CTA of the chest/abdomen and pelvis and will then be referred to see one of the CT surgeons on our TAVR team.   BMET and CBC today    Labs/ tests ordered today include:   Orders Placed This Encounter  Procedures   Basic metabolic panel   CBC   Disposition:   F/U will be arranged with the structural team  Signed, Verne Carrow, MD, [facility] 08/28/2023 2:50 PM    [facility] [address] [place], [place], [place]  [address] Phone: [phone]; Fax: [phone]

## 2023-08-28 NOTE — Progress Notes (Signed)
 [facility] Consult Note  Chief Complaint  Patient presents with   New Patient (Initial Visit)   History of Present Illness: 89 yo female with history of HTN, carotid artery disease, borderline DM, hyperlipidemia, GERD and severe aortic stenosis who is here today as a new consult, referred by Dr. Lavona, for further discussion regarding her aortic stenosis and possible TAVR. She has been followed by Dr. Lavona for moderate aortic stenosis. Echo 07/31/23 with LVEF=60-65%. Normal RV function. Trivial MR. Severe aortic stenosis with mean gradient 54 mmHg, AVA 0.88 cm2.   She tells me today that she has been having progressive dyspnea on exertion with mild chores around the house. She has no chest pain, palpitations, near syncope or syncope. No LE edema, orthopnea or PND.  She lives in [place], [place] with her husband. She is retired from the city of [place]. She has no active dental issues. She sees a education officer, community regularly.   Primary Care Physician: [name], Fonda LABOR, MD Primary Cardiologist: Hochrein Referring Cardiologist: Lavona  Past Medical History:  Diagnosis Date   Aortic insufficiency    Mild, echo, December, 2009   Arthritis    Carotid artery disease (HCC)    Cataract    Chest pain    Catheterization, 2003, no significant CAD   Diastolic dysfunction    Mild diastolic dysfunction, echo, December, 2012   DJD (degenerative joint disease)    Dyslipidemia    GERD (gastroesophageal reflux disease)    Hyperlipidemia    Hypertension    Mitral valve prolapse    Osteoporosis    Prediabetes    Thyroid  nodule     Past Surgical History:  Procedure Laterality Date   ABDOMINAL HYSTERECTOMY  1964   APPENDECTOMY  1946   BIOPSY THYROID  Left    CARDIAC CATHETERIZATION  sept 2003   no significant cad   CATARACT EXTRACTION, BILATERAL     COLONOSCOPY     NASAL SINUS SURGERY     TONSILLECTOMY      Current Outpatient Medications  Medication Sig Dispense Refill    Accu-Chek Softclix Lancets lancets Use to check blood sugars daily 100 each 3   albuterol  (VENTOLIN  HFA) 108 (90 Base) MCG/ACT inhaler Inhale 2 puffs into the lungs every 6 (six) hours as needed for wheezing or shortness of breath. 8 g 2   amLODipine -atorvastatin  (CADUET ) 10-40 MG tablet Take 1 tablet by mouth daily. 90 tablet 3   aspirin  81 MG tablet Take 81 mg by mouth daily.     Calcium  Carbonate-Vitamin D  (CALTRATE 600+D) 600-400 MG-UNIT per tablet Take 1 tablet by mouth daily.     cholecalciferol (VITAMIN D ) 400 UNITS TABS Take 1,000 Units by mouth.     fish oil-omega-3 fatty acids 1000 MG capsule Take 1 g by mouth daily.     fluticasone  (FLONASE ) 50 MCG/ACT nasal spray SPRAY 2 SPRAYS INTO EACH NOSTRIL EVERY DAY 48 mL 1   glucose blood test strip Check blood glucose QD and PRN DxE11.9 100 each 12   hydrALAZINE  (APRESOLINE ) 25 MG tablet Take 25 mg morning, 25 mg at lunch, and 50 mg ( 2 tablets ) at bedtime 336 tablet 3   metoprolol  succinate (TOPROL -XL) 100 MG 24 hr tablet Take 1 tablet (100 mg total) by mouth daily. TAKE WITH OR IMMEDIATELY FOLLOWING A MEAL. 90 tablet 3   Multiple Vitamin (MULTIVITAMIN) tablet Take 1 tablet by mouth daily.     telmisartan  (MICARDIS ) 80 MG tablet Take 1 tablet (80 mg total) by  mouth every evening. 90 tablet 3   triamcinolone  cream (KENALOG ) 0.1 % APPLY TO AFFECTED AREA TWICE A DAY 90 g 1   No current facility-administered medications for this visit.    Allergies  Allergen Reactions   Codeine Hives   Forteo [Parathyroid Hormone (Recomb)] Other (See Comments)    Weakness and calcium  increase   Raloxifene Other (See Comments)    Eye problems Other reaction(s): Other (See Comments) Eye problems Eye problems   Morphine Rash   Penicillins Rash   Sulfites Rash   Sulfonamide Derivatives Rash    Social History   Socioeconomic History   Marital status: Married    Spouse name: Ferdie Child Psychotherapist)   Number of children: 4   Years of education: Not on file    Highest education level: Not on file  Occupational History   Occupation: retired    Associate Professor: [employer]  Tobacco Use   Smoking status: Never   Smokeless tobacco: Never  Vaping Use   Vaping status: Never Used  Substance and Sexual Activity   Alcohol use: No    Alcohol/week: 0.0 standard drinks of alcohol   Drug use: No   Sexual activity: Never  Other Topics Concern   Not on file  Social History Narrative   Lives in 2 story home with her husband   Children live nearby   Social Drivers of Health   Financial Resource Strain: Low Risk  (05/14/2023)   Overall Financial Resource Strain (CARDIA)    Difficulty of Paying Living Expenses: Not hard at all  Food Insecurity: No Food Insecurity (05/14/2023)   Hunger Vital Sign    Worried About Running Out of Food in the Last Year: Never true    Ran Out of Food in the Last Year: Never true  Transportation Needs: No Transportation Needs (05/14/2023)   PRAPARE - Administrator, Civil Service (Medical): No    Lack of Transportation (Non-Medical): No  Physical Activity: Inactive (05/14/2023)   Exercise Vital Sign    Days of Exercise per Week: 0 days    Minutes of Exercise per Session: 0 min  Stress: No Stress Concern Present (05/14/2023)   [employer] of Occupational Health - Occupational Stress Questionnaire    Feeling of Stress : Not at all  Social Connections: Moderately Integrated (05/14/2023)   Social Connection and Isolation Panel [NHANES]    Frequency of Communication with Friends and Family: More than three times a week    Frequency of Social Gatherings with Friends and Family: More than three times a week    Attends Religious Services: 1 to 4 times per year    Active Member of [employer] or Organizations: No    Attends Banker Meetings: Never    Marital Status: Married  Catering Manager Violence: Not At Risk (05/14/2023)   Humiliation, Afraid, Rape, and Kick questionnaire    Fear of Current or  Ex-Partner: No    Emotionally Abused: No    Physically Abused: No    Sexually Abused: No    Family History  Problem Relation Age of Onset   Heart attack Mother    Hypertension Mother    Throat cancer Mother        THROAT / VOCAL CORD   Heart disease Mother    Hypertension Father    Heart disease Father    Kidney disease Father        failure   Bone cancer Sister    Stroke Sister  Bone cancer Grandchild        in rib cage   Hypertension Sister    Metabolic syndrome Sister        pre diabetes   GER disease Sister    Heart disease Sister        CAD   Heart attack Brother    Cirrhosis Brother    Breast cancer Sister    Bone cancer Sister    Hyperlipidemia Sister    Heart attack Sister    Stroke Son    Colitis Neg Hx    Esophageal cancer Neg Hx    Stomach cancer Neg Hx    Rectal cancer Neg Hx     Review of Systems:  As stated in the HPI and otherwise negative.   BP 128/64   Pulse (!) 58   Ht 5' (1.524 m)   Wt 59 kg   SpO2 97%   BMI 25.39 kg/m   Physical Examination: General: Well developed, well nourished, NAD  HEENT: OP clear, mucus membranes moist  SKIN: warm, dry. No rashes. Neuro: No focal deficits  Musculoskeletal: Muscle strength 5/5 all ext  Psychiatric: Mood and affect normal  Neck: No JVD, no carotid bruits, no thyromegaly, no lymphadenopathy.  Lungs:Clear bilaterally, no wheezes, rhonci, crackles Cardiovascular: Regular rate and rhythm. Loud, harsh, late peaking systolic murmur.  Abdomen:Soft. Bowel sounds present. Non-tender.  Extremities: No lower extremity edema. Pulses are 2 + in the bilateral DP/PT.  EKG:  EKG is not ordered today. The ekg ordered today demonstrates   Echo 07/31/23:  1. Left ventricular ejection fraction, by estimation, is 60 to 65%. The  left ventricle has normal function. The left ventricle has no regional  wall motion abnormalities. There is moderate asymmetric left ventricular  hypertrophy of the basal-septal   segment. Left ventricular diastolic parameters are consistent with Grade  II diastolic dysfunction (pseudonormalization). Elevated left atrial  pressure.   2. Right ventricular systolic function is normal. The right ventricular  size is normal. Tricuspid regurgitation signal is inadequate for assessing  PA pressure.   3. Left atrial size was moderately dilated.   4. Right atrial size was moderately dilated.   5. The mitral valve is normal in structure. Trivial mitral valve  regurgitation. No evidence of mitral stenosis.   6. The tricuspid valve is abnormal.   7. Severe aortic stenosis. Highest mean gradient is measured with Pedhoff  probe at 62 mmHg. Mean gradient by 2D probe likely underestimated based on  angle of acquisition. . The aortic valve is tricuspid. There is moderate  calcification of the aortic  valve. There is moderate thickening of the aortic valve. Aortic valve  regurgitation is not visualized. Severe aortic valve stenosis. Aortic  valve mean gradient measures 54.0 mmHg. Aortic valve peak gradient  measures 92.4 mmHg. Aortic valve area, by VTI  measures 0.94 cm.   8. The inferior vena cava is normal in size with greater than 50%  respiratory variability, suggesting right atrial pressure of 3 mmHg.   FINDINGS   Left Ventricle: Left ventricular ejection fraction, by estimation, is 60  to 65%. The left ventricle has normal function. The left ventricle has no  regional wall motion abnormalities. The left ventricular internal cavity  size was normal in size. There is   moderate asymmetric left ventricular hypertrophy of the basal-septal  segment. Left ventricular diastolic parameters are consistent with Grade  II diastolic dysfunction (pseudonormalization). Elevated left atrial  pressure.   Right Ventricle: The right ventricular  size is normal. Right vetricular  wall thickness was not well visualized. Right ventricular systolic  function is normal. Tricuspid  regurgitation signal is inadequate for  assessing PA pressure.   Left Atrium: Left atrial size was moderately dilated.   Right Atrium: Right atrial size was moderately dilated.   Pericardium: There is no evidence of pericardial effusion.   Mitral Valve: The mitral valve is normal in structure. There is mild  thickening of the mitral valve leaflet(s). There is mild calcification of  the mitral valve leaflet(s). Mild mitral annular calcification. Trivial  mitral valve regurgitation. No evidence   of mitral valve stenosis.   Tricuspid Valve: The tricuspid valve is abnormal. Tricuspid valve  regurgitation is mild . No evidence of tricuspid stenosis.   Aortic Valve: Severe aortic stenosis. Highest mean gradient is measured  with Pedhoff probe at 62 mmHg. Mean gradient by 2D probe likely  underestimated based on angle of acquisition. The aortic valve is  tricuspid. There is moderate calcification of the  aortic valve. There is moderate thickening of the aortic valve. There is  moderate aortic valve annular calcification. Aortic valve regurgitation is  not visualized. Severe aortic stenosis is present. Aortic valve mean  gradient measures 54.0 mmHg. Aortic  valve peak gradient measures 92.4 mmHg. Aortic valve area, by VTI measures  0.94 cm.   Pulmonic Valve: The pulmonic valve was not well visualized. Pulmonic valve  regurgitation is mild. No evidence of pulmonic stenosis.   Aorta: The aortic root is normal in size and structure.   Venous: The inferior vena cava is normal in size with greater than 50%  respiratory variability, suggesting right atrial pressure of 3 mmHg.   IAS/Shunts: No atrial level shunt detected by color flow Doppler.     LEFT VENTRICLE  PLAX 2D  LVIDd:         3.80 cm   Diastology  LVIDs:         2.10 cm   LV e' medial:    5.33 cm/s  LV PW:         1.00 cm   LV E/e' medial:  18.8  LV IVS:        1.30 cm   LV e' lateral:   8.92 cm/s  LVOT diam:     1.80 cm    LV E/e' lateral: 11.2  LV SV:         113  LV SV Index:   73  LVOT Area:     2.54 cm     RIGHT VENTRICLE  RV S prime:     11.70 cm/s  TAPSE (M-mode): 2.2 cm   LEFT ATRIUM             Index        RIGHT ATRIUM           Index  LA diam:        3.70 cm 2.37 cm/m   RA Area:     21.40 cm  LA Vol (A2C):   71.7 ml 45.98 ml/m  RA Volume:   63.30 ml  40.59 ml/m  LA Vol (A4C):   65.8 ml 42.20 ml/m  LA Biplane Vol: 71.7 ml 45.98 ml/m   AORTIC VALVE  AV Area (Vmax):    0.89 cm  AV Area (Vmean):   0.88 cm  AV Area (VTI):     0.94 cm  AV Vmax:           480.50 cm/s  AV Vmean:  339.000 cm/s  AV VTI:            1.200 m  AV Peak Grad:      92.4 mmHg  AV Mean Grad:      54.0 mmHg  LVOT Vmax:         168.00 cm/s  LVOT Vmean:        117.000 cm/s  LVOT VTI:          0.445 m  LVOT/AV VTI ratio: 0.37    AORTA  Ao Root diam: 3.20 cm   MITRAL VALVE  MV Area (PHT): 2.95 cm     SHUNTS  MV Decel Time: 257 msec     Systemic VTI:  0.44 m  MV E velocity: 100.00 cm/s  Systemic Diam: 1.80 cm  MV A velocity: 75.00 cm/s  MV E/A ratio:  1.33   Recent Labs: 06/18/2023: ALT 27; BUN 16; Creatinine, Ser 0.54; Hemoglobin 11.9; Platelets 207; Potassium 4.1; Sodium 143; TSH 1.380    Wt Readings from Last 3 Encounters:  08/28/23 59 kg  08/22/23 59 kg  06/21/23 59.4 kg    Assessment and Plan:   1. Severe Aortic Valve Stenosis: She has severe aortic valve stenosis. She has NYHA class 2 symptoms. I have personally reviewed the echo images. The aortic valve is thickened and calcified with limited leaflet mobility. I think she would benefit from AVR. Given advanced age, she is not a good candidate for conventional AVR by surgical approach. I think she may be a good candidate for TAVR.   I have reviewed the natural history of aortic stenosis with the patient and their family members  who are present today. We have discussed the limitations of medical therapy and the poor prognosis associated with  symptomatic aortic stenosis. We have reviewed potential treatment options, including palliative medical therapy, conventional surgical aortic valve replacement, and transcatheter aortic valve replacement. We discussed treatment options in the context of the patient's specific comorbid medical conditions.   She would like to proceed with planning for TAVR. I will arrange a right and left heart catheterization at [facility] on 09/05/23 at 10am. Risks and benefits of the cath procedure and the valve procedure are reviewed with the patient. After the cath, she will have a cardiac CT, CTA of the chest/abdomen and pelvis and will then be referred to see one of the CT surgeons on our TAVR team.   BMET and CBC today    Labs/ tests ordered today include:   Orders Placed This Encounter  Procedures   Basic metabolic panel   CBC   Disposition:   F/U will be arranged with the structural team  Signed, Lonni Cash, MD, [facility] 08/28/2023 2:50 PM    [facility] [address] [place], [place], [place]  [address] Phone: [phone]; Fax: [phone]

## 2023-08-28 NOTE — Progress Notes (Addendum)
 Pre Surgical Assessment: 5 M Walk Test  51M=16.81ft  5 Meter Walk Test- trial 1: 11.20 seconds 5 Meter Walk Test- trial 2: 9.30 seconds 5 Meter Walk Test- trial 3: 9.44 seconds 5 Meter Walk Test Average: 9.98 seconds  ___________________________  Procedure Type: Isolated AVR Perioperative Outcome Estimate % Operative Mortality 7.1% Morbidity & Mortality 11% Stroke 2.44% Renal Failure 1.83% Reoperation 3.76% Prolonged Ventilation 5.92% Deep Sternal Wound Infection 0.037% Long Hospital Stay (>14 days) 6.83% Short Hospital Stay (<6 days)* 30.7%

## 2023-08-28 NOTE — Patient Instructions (Signed)
 Medication Instructions:  No changes *If you need a refill on your cardiac medications before your next appointment, please call your pharmacy*  Lab Work: Today: cbc, bmet  Testing/Procedures: Your physician has requested that you have a cardiac catheterization. Cardiac catheterization is used to diagnose and/or treat various heart conditions. Doctors may recommend this procedure for a number of different reasons. The most common reason is to evaluate chest pain. Chest pain can be a symptom of coronary artery disease (CAD), and cardiac catheterization can show whether plaque is narrowing or blocking your heart's arteries. This procedure is also used to evaluate the valves, as well as measure the blood flow and oxygen levels in different parts of your heart. For further information please visit [email or web address] Please follow instruction sheet, as given.  Follow-Up: Per Structural Heart Team  Other Instructions  [facility] A DEPT OF [facility] AT [facility] [address] [place], [address] [place] [address] Dept: [phone] Loc: [phone]  Melissa Carpenter  08/28/2023  You are scheduled for a Cardiac Catheterization on Wednesday, January 22 with Dr. Lonni Cash.  1. Please arrive at the [facility] (Main Entrance A) at [facility]: [address] [place], [place] [address] at 8:00 AM (This time is 2 hour(s) before your procedure to ensure your preparation).   Free valet parking service is available. You will check in at ADMITTING. The support person will be asked to wait in the waiting room.  It is OK to have someone drop you off and come back when you are ready to be discharged.    Special note: Every effort is made to have your procedure done on time. Please understand that emergencies sometimes delay scheduled procedures.  2. Diet: Do not eat solid foods after midnight.  The patient may have clear liquids  until 5am upon the day of the procedure.  3. Labs: You will need to have blood drawn on Tuesday, January 14 at [facility]. 4. Medication instructions in preparation for your procedure:   Contrast Allergy: No    Current Outpatient Medications (Cardiovascular):    amLODipine -atorvastatin  (CADUET ) 10-40 MG tablet, Take 1 tablet by mouth daily.   hydrALAZINE  (APRESOLINE ) 25 MG tablet, Take 25 mg morning, 25 mg at lunch, and 50 mg ( 2 tablets ) at bedtime   metoprolol  succinate (TOPROL -XL) 100 MG 24 hr tablet, Take 1 tablet (100 mg total) by mouth daily. TAKE WITH OR IMMEDIATELY FOLLOWING A MEAL.   telmisartan  (MICARDIS ) 80 MG tablet, Take 1 tablet (80 mg total) by mouth every evening.  Current Outpatient Medications (Respiratory):    albuterol  (VENTOLIN  HFA) 108 (90 Base) MCG/ACT inhaler, Inhale 2 puffs into the lungs every 6 (six) hours as needed for wheezing or shortness of breath.   fluticasone  (FLONASE ) 50 MCG/ACT nasal spray, SPRAY 2 SPRAYS INTO EACH NOSTRIL EVERY DAY  Current Outpatient Medications (Analgesics):    aspirin  81 MG tablet, Take 81 mg by mouth daily.   Current Outpatient Medications (Other):    Accu-Chek Softclix Lancets lancets, Use to check blood sugars daily   Calcium  Carbonate-Vitamin D  (CALTRATE 600+D) 600-400 MG-UNIT per tablet, Take 1 tablet by mouth daily.   cholecalciferol (VITAMIN D ) 400 UNITS TABS, Take 1,000 Units by mouth.   fish oil-omega-3 fatty acids 1000 MG capsule, Take 1 g by mouth daily.   glucose blood test strip, Check blood glucose QD and PRN DxE11.9   Multiple Vitamin (MULTIVITAMIN) tablet, Take 1 tablet by  mouth daily.   triamcinolone  cream (KENALOG ) 0.1 %, APPLY TO AFFECTED AREA TWICE A DAY *For reference purposes while preparing patient instructions.   Delete this med list prior to printing instructions for patient.*  On the morning of your procedure, take your Aspirin  81 mg and any morning medicines NOT listed above.  You may use sips of  water.  5. Plan to go home the same day, you will only stay overnight if medically necessary. 6. Bring a current list of your medications and current insurance cards. 7. You MUST have a responsible person to drive you home. 8. Someone MUST be with you the first 24 hours after you arrive home or your discharge will be delayed. 9. Please wear clothes that are easy to get on and off and wear slip-on shoes.  Thank you for allowing us  to care for you!   -- [facility]

## 2023-08-29 LAB — CBC
Hematocrit: 37.4 % (ref 34.0–46.6)
Hemoglobin: 12.3 g/dL (ref 11.1–15.9)
MCH: 31.2 pg (ref 26.6–33.0)
MCHC: 32.9 g/dL (ref 31.5–35.7)
MCV: 95 fL (ref 79–97)
Platelets: 186 10*3/uL (ref 150–450)
RBC: 3.94 x10E6/uL (ref 3.77–5.28)
RDW: 12.4 % (ref 11.7–15.4)
WBC: 5.4 10*3/uL (ref 3.4–10.8)

## 2023-08-29 LAB — BASIC METABOLIC PANEL
BUN/Creatinine Ratio: 28 (ref 12–28)
BUN: 14 mg/dL (ref 8–27)
CO2: 24 mmol/L (ref 20–29)
Calcium: 9.8 mg/dL (ref 8.7–10.3)
Chloride: 106 mmol/L (ref 96–106)
Creatinine, Ser: 0.5 mg/dL — ABNORMAL LOW (ref 0.57–1.00)
Glucose: 93 mg/dL (ref 70–99)
Potassium: 4.5 mmol/L (ref 3.5–5.2)
Sodium: 144 mmol/L (ref 134–144)
eGFR: 91 mL/min/{1.73_m2} (ref >59)

## 2023-09-03 ENCOUNTER — Ambulatory Visit: Payer: Medicare Other | Admitting: Cardiovascular Disease

## 2023-09-04 ENCOUNTER — Telehealth: Payer: Self-pay | Admitting: *Deleted

## 2023-09-04 NOTE — Telephone Encounter (Addendum)
Cardiac Catheterization scheduled at [facility] for: Wednesday September 05, 2023 9 AM (time change from 10 AM) Arrival time [facility] Main Entrance A at: 7 AM  Nothing to eat after midnight prior to procedure, clear liquids until 5 AM day of procedure.  Medication instructions: -Usual morning medications can be taken with sips of water including aspirin 81 mg.  Plan to go home the same day, you will only stay overnight if medically necessary.  You must have responsible adult to drive you home.  Someone must be with you the first 24 hours after you arrive home.  Reviewed procedure instructions with patient.

## 2023-09-05 ENCOUNTER — Ambulatory Visit (HOSPITAL_COMMUNITY)
Admission: RE | Admit: 2023-09-05 | Discharge: 2023-09-05 | Disposition: A | Discharge status: Home / Self Care | Payer: Medicare Other | Attending: Cardiovascular Disease | Admitting: Cardiovascular Disease

## 2023-09-05 ENCOUNTER — Encounter (HOSPITAL_COMMUNITY)
Admission: RE | Disposition: A | Discharge status: Home / Self Care | Payer: Self-pay | Source: Home / Self Care | Attending: Cardiovascular Disease

## 2023-09-05 ENCOUNTER — Other Ambulatory Visit: Payer: Self-pay

## 2023-09-05 DIAGNOSIS — I10 Essential (primary) hypertension: Secondary | ICD-10-CM | POA: Insufficient documentation

## 2023-09-05 DIAGNOSIS — I352 Nonrheumatic aortic (valve) stenosis with insufficiency: Secondary | ICD-10-CM | POA: Insufficient documentation

## 2023-09-05 DIAGNOSIS — E119 Type 2 diabetes mellitus without complications: Secondary | ICD-10-CM | POA: Insufficient documentation

## 2023-09-05 DIAGNOSIS — R0609 Other forms of dyspnea: Secondary | ICD-10-CM | POA: Diagnosis not present

## 2023-09-05 DIAGNOSIS — I350 Nonrheumatic aortic (valve) stenosis: Secondary | ICD-10-CM | POA: Diagnosis not present

## 2023-09-05 DIAGNOSIS — E785 Hyperlipidemia, unspecified: Secondary | ICD-10-CM | POA: Diagnosis not present

## 2023-09-05 DIAGNOSIS — K219 Gastro-esophageal reflux disease without esophagitis: Secondary | ICD-10-CM | POA: Diagnosis not present

## 2023-09-05 DIAGNOSIS — I2510 Atherosclerotic heart disease of native coronary artery without angina pectoris: Secondary | ICD-10-CM | POA: Insufficient documentation

## 2023-09-05 HISTORY — PX: RIGHT HEART CATH AND CORONARY ANGIOGRAPHY: CATH118264

## 2023-09-05 LAB — GLUCOSE, CAPILLARY
Glucose-Capillary: 100 mg/dL — ABNORMAL HIGH (ref 70–99)
Glucose-Capillary: 93 mg/dL (ref 70–99)

## 2023-09-05 SURGERY — RIGHT HEART CATH AND CORONARY ANGIOGRAPHY
Anesthesia: LOCAL

## 2023-09-05 MED ORDER — FENTANYL CITRATE (PF) 100 MCG/2ML IJ SOLN
INTRAMUSCULAR | Status: DC | PRN
  Administered 2023-09-05: 25 ug via INTRAVENOUS

## 2023-09-05 MED ORDER — MIDAZOLAM HCL 2 MG/2ML IJ SOLN
INTRAMUSCULAR | Status: DC | PRN
  Administered 2023-09-05: 1 mg via INTRAVENOUS

## 2023-09-05 MED ORDER — ACETAMINOPHEN 325 MG PO TABS: 650.0000 mg | ORAL_TABLET | ORAL | Status: DC | PRN

## 2023-09-05 MED ORDER — LABETALOL HCL 5 MG/ML IV SOLN: 10.0000 mg | INTRAVENOUS | Status: DC | PRN

## 2023-09-05 MED ORDER — VERAPAMIL HCL 2.5 MG/ML IV SOLN
INTRAVENOUS | Status: AC
  Filled 2023-09-05: qty 2

## 2023-09-05 MED ORDER — FENTANYL CITRATE (PF) 100 MCG/2ML IJ SOLN
INTRAMUSCULAR | Status: AC
  Filled 2023-09-05: qty 2

## 2023-09-05 MED ORDER — LIDOCAINE HCL (PF) 1 % IJ SOLN
INTRAMUSCULAR | Status: AC
  Filled 2023-09-05: qty 30

## 2023-09-05 MED ORDER — SODIUM CHLORIDE 0.9 % WEIGHT BASED INFUSION: 3.0000 mL/kg/h | INTRAVENOUS | Status: AC

## 2023-09-05 MED ORDER — IOHEXOL 350 MG/ML SOLN
INTRAVENOUS | Status: DC | PRN
  Administered 2023-09-05: 32 mL

## 2023-09-05 MED ORDER — SODIUM CHLORIDE 0.9 % IV SOLN: INTRAVENOUS | Status: DC

## 2023-09-05 MED ORDER — HEPARIN SODIUM (PORCINE) 1000 UNIT/ML IJ SOLN
INTRAMUSCULAR | Status: AC
Start: 2023-09-05 — End: ?
  Filled 2023-09-05: qty 10

## 2023-09-05 MED ORDER — LIDOCAINE HCL (PF) 1 % IJ SOLN
INTRAMUSCULAR | Status: DC | PRN
  Administered 2023-09-05 (×2): 3 mL

## 2023-09-05 MED ORDER — SODIUM CHLORIDE 0.9% FLUSH: 3.0000 mL | INTRAVENOUS | Status: DC | PRN

## 2023-09-05 MED ORDER — SODIUM CHLORIDE 0.9% FLUSH: 3.0000 mL | Freq: Two times a day (BID) | INTRAVENOUS | Status: DC

## 2023-09-05 MED ORDER — HEPARIN SODIUM (PORCINE) 1000 UNIT/ML IJ SOLN
INTRAMUSCULAR | Status: DC | PRN
  Administered 2023-09-05: 3000 [IU] via INTRAVENOUS

## 2023-09-05 MED ORDER — SODIUM CHLORIDE 0.9 % IV SOLN: 250.0000 mL | INTRAVENOUS | Status: DC | PRN

## 2023-09-05 MED ORDER — ASPIRIN 81 MG PO CHEW: 81.0000 mg | CHEWABLE_TABLET | ORAL | Status: DC

## 2023-09-05 MED ORDER — MIDAZOLAM HCL 2 MG/2ML IJ SOLN
INTRAMUSCULAR | Status: AC
  Filled 2023-09-05: qty 2

## 2023-09-05 MED ORDER — HEPARIN (PORCINE) IN NACL 1000-0.9 UT/500ML-% IV SOLN
INTRAVENOUS | Status: DC | PRN
  Administered 2023-09-05 (×2): 500 mL

## 2023-09-05 MED ORDER — HYDRALAZINE HCL 20 MG/ML IJ SOLN: 10.0000 mg | INTRAMUSCULAR | Status: DC | PRN

## 2023-09-05 MED ORDER — ONDANSETRON HCL 4 MG/2ML IJ SOLN
4.0000 mg | Freq: Four times a day (QID) | INTRAMUSCULAR | Status: DC | PRN
Start: 2023-09-05 — End: 2023-09-05

## 2023-09-05 MED ORDER — SODIUM CHLORIDE 0.9 % WEIGHT BASED INFUSION: 1.0000 mL/kg/h | INTRAVENOUS | Status: DC

## 2023-09-05 MED ORDER — VERAPAMIL HCL 2.5 MG/ML IV SOLN: INTRAVENOUS | Status: DC | PRN

## 2023-09-05 SURGICAL SUPPLY — 10 items
CATH 5FR JL3.5 JR4 ANG PIG MP (CATHETERS) IMPLANT
CATH BALLN WEDGE 5F 110CM (CATHETERS) IMPLANT
DEVICE RAD COMP TR BAND LRG (VASCULAR PRODUCTS) IMPLANT
GLIDESHEATH SLEND SS 6F .021 (SHEATH) IMPLANT
GUIDEWIRE .025 260CM (WIRE) IMPLANT
GUIDEWIRE INQWIRE 1.5J.035X260 (WIRE) IMPLANT
INQWIRE 1.5J .035X260CM (WIRE) ×1 IMPLANT
PACK CARDIAC CATHETERIZATION (CUSTOM PROCEDURE TRAY) ×2 IMPLANT
SET ATX-X65L (MISCELLANEOUS) IMPLANT
SHEATH GLIDE SLENDER 4/5FR (SHEATH) IMPLANT

## 2023-09-05 NOTE — Interval H&P Note (Signed)
History and Physical Interval Note:  09/05/2023 7:32 AM  [name]  has presented today for surgery, with the diagnosis of aortic stenosis.  The various methods of treatment have been discussed with the patient and family. After consideration of risks, benefits and other options for treatment, the patient has consented to  Procedure(s): RIGHT/LEFT HEART CATH AND CORONARY ANGIOGRAPHY (N/A) as a surgical intervention.  The patient's history has been reviewed, patient examined, no change in status, stable for surgery.  I have reviewed the patient's chart and labs.  Questions were answered to the patient's satisfaction.    Cath Lab Visit (complete for each Cath Lab visit)  Clinical Evaluation Leading to the Procedure:   ACS: No.  Non-ACS:    Anginal Classification: CCS II  Anti-ischemic medical therapy: Maximal Therapy (2 or more classes of medications)  Non-Invasive Test Results: No non-invasive testing performed  Prior CABG: No previous CABG        Verne Carrow

## 2023-09-05 NOTE — Discharge Instructions (Signed)
 Radial Site Care The following information offers guidance on how to care for yourself after your procedure. Your health care provider may also give you more specific instructions. If you have problems or questions, contact your health care provider. What can I expect after the procedure? After the procedure, it is common to have bruising and tenderness in the incision area. Follow these instructions at home: Incision site care  Follow instructions from your health care provider about how to take care of your incision site. Make sure you: Wash your hands with soap and water for at least 20 seconds before and after you change your bandage (dressing). If soap and water are not available, use hand sanitizer. Remove your dressing in 24 hours. Leave stitches (sutures), skin glue, or adhesive strips in place. These skin closures may need to stay in place for 2 weeks or longer. If adhesive strip edges start to loosen and curl up, you may trim the loose edges. Do not remove adhesive strips completely unless your health care provider tells you to do that. Do not take baths, swim, or use a hot tub for at least 1 week. You may shower 24 hours after the procedure or as told by your health care provider. Remove the dressing and gently wash the incision area with plain soap and water. Pat the area dry with a clean towel. Do not rub the site. That could cause bleeding. Do not apply powder or lotion to the site. Check your incision site every day for signs of infection. Check for: Redness, swelling, or pain. Fluid or blood. Warmth. Pus or a bad smell. Activity For 24 hours after the procedure, or as directed by your health care provider: Do not flex or bend the affected arm. Do not push or pull heavy objects with the affected arm. Do not operate machinery or power tools. Do not drive. You should not drive yourself home from the hospital or clinic if you go home during that time period. You may drive 24  hours after the procedure unless your health care provider tells you not to. Do not lift anything that is heavier than 10 lb (4.5 kg), or the limit that you are told, until your health care provider says that it is safe. Return to your normal activities as told by your health care provider. Ask your health care provider what activities are safe for you and when you can return to work. If you were given a sedative during the procedure, it can affect you for several hours. Do not drive or operate machinery until your health care provider says that it is safe. General instructions Take over-the-counter and prescription medicines only as told by your health care provider. If you will be going home right after the procedure, plan to have a responsible adult care for you for the time you are told. This is important. Keep all follow-up visits. This is important. Contact a health care provider if: You have a fever or chills. You have any of these signs of infection at your incision site: Redness, swelling, or pain. Fluid or blood. Warmth. Pus or a bad smell. Get help right away if: The incision area swells very fast. The incision area is bleeding, and the bleeding does not stop when you hold steady pressure on the area. Your arm or hand becomes pale, cool, tingly, or numb. These symptoms may represent a serious problem that is an emergency. Do not wait to see if the symptoms will go away. Get medical  help right away. Call your local emergency services (911 in the [place] Do not drive yourself to the hospital. Summary After the procedure, it is common to have bruising and tenderness at the incision site. Follow instructions from your health care provider about how to take care of your radial site incision. Check the incision every day for signs of infection. Do not lift anything that is heavier than 10 lb (4.5 kg), or the limit that you are told, until your health care provider says that it is  safe. Get help right away if the incision area swells very fast, you have bleeding at the incision site that will not stop, or your arm or hand becomes pale, cool, or numb. This information is not intended to replace advice given to you by your health care provider. Make sure you discuss any questions you have with your health care provider. Document Revised: 09/19/2020 Document Reviewed: 09/19/2020 Elsevier Patient Education  2024 [employer].

## 2023-09-06 ENCOUNTER — Encounter (HOSPITAL_COMMUNITY): Payer: Self-pay | Admitting: Cardiovascular Disease

## 2023-09-06 LAB — POCT I-STAT 7, (LYTES, BLD GAS, ICA,H+H)
Acid-base deficit: 5 mmol/L — ABNORMAL HIGH (ref 0.0–2.0)
Bicarbonate: 21 mmol/L (ref 20.0–28.0)
Calcium, Ion: 1.08 mmol/L — ABNORMAL LOW (ref 1.15–1.40)
HCT: 30 % — ABNORMAL LOW (ref 36.0–46.0)
Hemoglobin: 10.2 g/dL — ABNORMAL LOW (ref 12.0–15.0)
O2 Saturation: 87 %
Potassium: 3.2 mmol/L — ABNORMAL LOW (ref 3.5–5.1)
Sodium: 147 mmol/L — ABNORMAL HIGH (ref 135–145)
TCO2: 22 mmol/L (ref 22–32)
pCO2 arterial: 39.5 mm[Hg] (ref 32–48)
pH, Arterial: 7.333 — ABNORMAL LOW (ref 7.35–7.45)
pO2, Arterial: 57 mm[Hg] — ABNORMAL LOW (ref 83–108)

## 2023-09-06 LAB — POCT I-STAT EG7
Acid-base deficit: 10 mmol/L — ABNORMAL HIGH (ref 0.0–2.0)
Bicarbonate: 17.8 mmol/L — ABNORMAL LOW (ref 20.0–28.0)
Calcium, Ion: 1.01 mmol/L — ABNORMAL LOW (ref 1.15–1.40)
HCT: 35 % — ABNORMAL LOW (ref 36.0–46.0)
Hemoglobin: 11.9 g/dL — ABNORMAL LOW (ref 12.0–15.0)
O2 Saturation: 55 %
Potassium: 2.8 mmol/L — ABNORMAL LOW (ref 3.5–5.1)
Sodium: 123 mmol/L — ABNORMAL LOW (ref 135–145)
TCO2: 19 mmol/L — ABNORMAL LOW (ref 22–32)
pCO2, Ven: 46.7 mm[Hg] (ref 44–60)
pH, Ven: 7.19 — CL (ref 7.25–7.43)
pO2, Ven: 36 mm[Hg] (ref 32–45)

## 2023-09-10 ENCOUNTER — Ambulatory Visit: Payer: Medicare Other | Admitting: Family Medicine

## 2023-09-10 ENCOUNTER — Encounter: Payer: Self-pay | Admitting: Student

## 2023-09-10 DIAGNOSIS — J40 Bronchitis, not specified as acute or chronic: Secondary | ICD-10-CM | POA: Diagnosis not present

## 2023-09-10 MED ORDER — PREDNISONE 20 MG PO TABS: ORAL_TABLET | ORAL | 0 refills | Status: DC

## 2023-09-10 MED ORDER — ALBUTEROL SULFATE HFA 108 (90 BASE) MCG/ACT IN AERS: 2.0000 | INHALATION_SPRAY | Freq: Four times a day (QID) | RESPIRATORY_TRACT | 2 refills | Status: AC | PRN

## 2023-09-10 MED ORDER — CEFDINIR 300 MG PO CAPS: 300.0000 mg | ORAL_CAPSULE | Freq: Two times a day (BID) | ORAL | 0 refills | Status: DC

## 2023-09-10 NOTE — Progress Notes (Signed)
BP (!) 158/55   Pulse 65   Ht 5' (1.524 m)   Wt 59 kg   SpO2 98%   BMI 25.39 kg/m    Subjective:   Patient ID: Melissa Carpenter, female    DOB: 29-Sep-1933, 89 y.o.   MRN: [id number]  HPI: Melissa Carpenter is a 89 y.o. female presenting on 09/10/2023 for URI   URI  Associated symptoms include congestion, coughing, rhinorrhea and wheezing. Pertinent negatives include no chest pain, diarrhea, ear pain, headaches or sore throat.   Comes in with 2 days of nasal congestion. Symptoms have became worse and feels like it is getting in her chest. She has had some blood when she blows her nose and a productive cough. She notes sputum is tan and thick. She has tried Mucinex and Flonase with no improvement. But has found some relief using Tylenol. She has noted a little wheezing but is short of breath at baseline. She is out of her Albuterol inhaler. She has not checked her fever. She was around her granddaughter who has bronchitis 1 day before her symptoms started. She has had pneumonia in the past and is afraid it will turn into that. Denies chills, body aches, sore throat, or chest pain.  Relevant past medical, surgical, family and social history reviewed and updated as indicated. Interim medical history since our last visit reviewed. Allergies and medications reviewed and updated.  Review of Systems  Constitutional:  Positive for fatigue. Negative for chills and fever.  HENT:  Positive for congestion, postnasal drip and rhinorrhea. Negative for ear pain, sinus pressure and sore throat.   Respiratory:  Positive for cough and wheezing. Negative for chest tightness and shortness of breath.   Cardiovascular:  Negative for chest pain and leg swelling.  Gastrointestinal:  Negative for diarrhea.  Neurological:  Negative for weakness and headaches.  Psychiatric/Behavioral:  Negative for agitation.   All other systems reviewed and are negative.   Per HPI unless specifically indicated above   Allergies as of  09/10/2023       Reactions   Codeine Hives   Forteo [parathyroid Hormone (recomb)] Other (See Comments)   Weakness and calcium increase   Raloxifene Other (See Comments)   Eye problems   Morphine Rash   Penicillins Rash   Sulfites Rash   Sulfonamide Derivatives Rash        Medication List        Accurate as of September 10, 2023  5:01 PM. If you have any questions, ask your nurse or doctor.          Accu-Chek Softclix Lancets lancets Use to check blood sugars daily   acetaminophen 500 MG tablet Commonly known as: TYLENOL Take 500 mg by mouth every 6 (six) hours as needed for moderate pain (pain score 4-6).   albuterol 108 (90 Base) MCG/ACT inhaler Commonly known as: VENTOLIN HFA Inhale 2 puffs into the lungs every 6 (six) hours as needed for wheezing or shortness of breath.   amLODipine-atorvastatin 10-40 MG tablet Commonly known as: CADUET Take 1 tablet by mouth daily.   aspirin 81 MG tablet Take 81 mg by mouth daily.   Calcium Carbonate-Vitamin D 600-400 MG-UNIT tablet Commonly known as: Caltrate 600+D Take 1 tablet by mouth daily.   cefdinir 300 MG capsule Commonly known as: OMNICEF Take 1 capsule (300 mg total) by mouth 2 (two) times daily. 1 po BID Started by: [name] Bernard Donahoo   cholecalciferol 25 MCG (1000 UNIT) tablet Commonly known as:  VITAMIN D3 Take 1,000 Units by mouth daily.   fish oil-omega-3 fatty acids 1000 MG capsule Take 1 g by mouth daily.   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 TAKE WITH OR IMMEDIATELY FOLLOWING A MEAL.   MUCINEX PO Take 1,200 mg by  mouth daily as needed (cough in chest).   multivitamin tablet Take 1 tablet by mouth daily.   polyethylene glycol powder 17 GM/SCOOP powder Commonly known as: GLYCOLAX/MIRALAX Take 17 g by mouth daily.   predniSONE 20 MG tablet Commonly known as: DELTASONE 2 po at same time daily for 5 days Started by: [name] Kailin Principato   telmisartan 80 MG tablet Commonly known as: MICARDIS Take 1 tablet (80 mg total) by mouth every evening.   triamcinolone cream 0.1 % Commonly known as: KENALOG APPLY TO AFFECTED AREA TWICE A DAY What changed: See the new instructions.         Objective:   BP (!) 158/55   Pulse 65   Ht 5' (1.524 m)   Wt 59 kg   SpO2 98%   BMI 25.39 kg/m   Wt Readings from Last 3 Encounters:  09/10/23 59 kg  09/05/23 59 kg  08/28/23 59 kg    Physical Exam Vitals reviewed.  Constitutional:      General: She is not in acute distress.    Appearance: Normal appearance. She is not diaphoretic.  HENT:     Right Ear: Tympanic membrane normal.     Left Ear: Tympanic membrane normal.     Nose: Congestion present.     Mouth/Throat:     Mouth: Mucous membranes are moist.     Pharynx: No oropharyngeal exudate or posterior oropharyngeal erythema.  Eyes:     Conjunctiva/sclera: Conjunctivae normal.  Cardiovascular:     Rate and Rhythm: Normal rate and regular rhythm.     Heart sounds: Murmur (4/6 Holosystolic murmur best heard at right second intercostal space) heard.  Pulmonary:     Effort: Pulmonary effort is normal. No accessory muscle usage or respiratory distress.     Breath sounds: No wheezing, rhonchi or rales.  Skin:    General: Skin is warm and dry.  Neurological:     Mental Status: She is alert and oriented to person, place, and time.  Psychiatric:        Behavior: Behavior normal.       Assessment & Plan:   Problem List Items Addressed This Visit   None Visit Diagnoses       Bronchitis       Relevant Medications   albuterol (VENTOLIN HFA)  108 (90 Base) MCG/ACT inhaler   predniSONE (DELTASONE) 20 MG tablet   cefdinir (OMNICEF) 300 MG capsule       Likely Bronchitis, she is experiencing wheezing and a productive cough. She has been around her granddaughter who has bronchitis prior to her symptoms appearing. Will treat her with Prednisone and Albuterol inhaler to help with cough and congestion. Along with any shortness of breath. Will give her Omnicef to take care of any infection.  Follow up  plan: Return if symptoms worsen or fail to improve.  Counseling provided for all of the vaccine components No orders of the defined types were placed in this encounter.   Maximino Sarin PA-S [facility] 09/10/2023, 5:01 PM  I was personally present for all components of the history, physical exam and/or medical decision making.  I agree with the documentation performed by the student and agree with assessment and plan above.  Will treat like bronchitis Arville Care, MD [facility] 09/14/2023, 9:44 AM

## 2023-09-19 ENCOUNTER — Ambulatory Visit (HOSPITAL_COMMUNITY)
Admission: RE | Admit: 2023-09-19 | Discharge: 2023-09-19 | Disposition: A | Discharge status: Home / Self Care | Payer: Medicare Other | Source: Ambulatory Visit | Attending: Family Medicine | Admitting: Family Medicine

## 2023-09-19 DIAGNOSIS — R590 Localized enlarged lymph nodes: Secondary | ICD-10-CM | POA: Diagnosis not present

## 2023-09-19 DIAGNOSIS — R918 Other nonspecific abnormal finding of lung field: Secondary | ICD-10-CM | POA: Diagnosis not present

## 2023-09-19 DIAGNOSIS — I350 Nonrheumatic aortic (valve) stenosis: Secondary | ICD-10-CM | POA: Insufficient documentation

## 2023-09-19 DIAGNOSIS — Z01818 Encounter for other preprocedural examination: Secondary | ICD-10-CM | POA: Diagnosis not present

## 2023-09-19 DIAGNOSIS — R932 Abnormal findings on diagnostic imaging of liver and biliary tract: Secondary | ICD-10-CM | POA: Diagnosis not present

## 2023-09-19 MED ORDER — IOHEXOL 350 MG/ML SOLN
95.0000 mL | Freq: Once | INTRAVENOUS | Status: AC | PRN
  Administered 2023-09-19: 95 mL via INTRAVENOUS

## 2023-09-27 ENCOUNTER — Institutional Professional Consult (permissible substitution) (INDEPENDENT_AMBULATORY_CARE_PROVIDER_SITE_OTHER): Payer: Medicare Other | Admitting: Thoracic Surgery (Cardiothoracic Vascular Surgery)

## 2023-09-27 ENCOUNTER — Encounter: Payer: Self-pay | Admitting: Thoracic Surgery (Cardiothoracic Vascular Surgery)

## 2023-09-27 VITALS — BP 164/64 | HR 66 | Resp 20 | Wt 131.2 lb

## 2023-09-27 DIAGNOSIS — I350 Nonrheumatic aortic (valve) stenosis: Secondary | ICD-10-CM

## 2023-09-27 NOTE — Progress Notes (Signed)
[address]       [place] [address]             [id number]           Sidni Fusco [facility] Medical Record #[id number] Date of Birth: [birth year]  [name]* [name], [name], MD  Chief Complaint: DOE    History of Present Illness:     Pt is an 89 yo female who has been followed for moderate AS and has recently been having more DOE with housework. She has no CP nor syncope. She had recent echo where now her AS has progressed to a mean gradient of and with a valve area of 0.94. She has an EF of 60% and cath without CAD. She underwent CTA which has acceptable anatomy for TAVR (low LM but large sinus)       Past Medical History:  Diagnosis Date   Aortic insufficiency    Mild, echo, December, 2009   Arthritis    Carotid artery disease (HCC)    Cataract    Chest pain    Catheterization, 2003, no significant CAD   Diastolic dysfunction    Mild diastolic dysfunction, echo, December, 2012   DJD (degenerative joint disease)    Dyslipidemia    GERD (gastroesophageal reflux disease)    Hyperlipidemia    Hypertension    Mitral valve prolapse    Osteoporosis    Prediabetes    Thyroid nodule     Past Surgical History:  Procedure Laterality Date   ABDOMINAL HYSTERECTOMY  1964   APPENDECTOMY  1946   BIOPSY THYROID Left    CARDIAC CATHETERIZATION  04/2002   no significant cad   CATARACT EXTRACTION, BILATERAL     COLONOSCOPY     NASAL SINUS SURGERY     RIGHT HEART CATH AND CORONARY ANGIOGRAPHY N/A 09/05/2023   Procedure: RIGHT HEART CATH AND CORONARY ANGIOGRAPHY;  Surgeon: [name], MD;  Location: [facility];  Service: Cardiovascular;  Laterality: N/A;   TONSILLECTOMY      Social History   Tobacco Use  Smoking Status Never  Smokeless Tobacco Never    Social History   Substance and Sexual Activity  Alcohol Use No   Alcohol/week: 0.0 standard drinks of alcohol    Social History   Socioeconomic  History   Marital status: Married    Spouse name: Dayton Scrape Child psychotherapist)   Number of children: 4   Years of education: Not on file   Highest education level: Not on file  Occupational History   Occupation: retired    Associate Professor: [employer]  Tobacco Use   Smoking status: Never   Smokeless tobacco: Never  Vaping Use   Vaping status: Never Used  Substance and Sexual Activity   Alcohol use: No    Alcohol/week: 0.0 standard drinks of alcohol   Drug use: No   Sexual activity: Never  Other Topics Concern   Not on file  Social History Narrative   Lives in 2 story home with her husband   Children live nearby   Social Drivers of Health   Financial Resource Strain: Low Risk  (05/14/2023)   Overall Financial Resource Strain (CARDIA)    Difficulty of Paying Living Expenses: Not hard at all  Food Insecurity: No Food Insecurity (05/14/2023)   Hunger Vital Sign    Worried About Running Out of Food in the Last Year: Never true    Ran Out of Food  in the Last Year: Never true  Transportation Needs: No Transportation Needs (05/14/2023)   PRAPARE - Administrator, Civil Service (Medical): No    Lack of Transportation (Non-Medical): No  Physical Activity: Inactive (05/14/2023)   Exercise Vital Sign    Days of Exercise per Week: 0 days    Minutes of Exercise per Session: 0 min  Stress: No Stress Concern Present (05/14/2023)   [employer] of Occupational Health - Occupational Stress Questionnaire    Feeling of Stress : Not at all  Social Connections: Moderately Integrated (05/14/2023)   Social Connection and Isolation Panel [NHANES]    Frequency of Communication with Friends and Family: More than three times a week    Frequency of Social Gatherings with Friends and Family: More than three times a week    Attends Religious Services: 1 to 4 times per year    Active Member of [employer] or Organizations: No    Attends Banker Meetings: Never    Marital Status: Married   Catering manager Violence: Not At Risk (05/14/2023)   Humiliation, Afraid, Rape, and Kick questionnaire    Fear of Current or Ex-Partner: No    Emotionally Abused: No    Physically Abused: No    Sexually Abused: No    Allergies  Allergen Reactions   Codeine Hives   Forteo [Parathyroid Hormone (Recomb)] Other (See Comments)    Weakness and calcium increase   Raloxifene Other (See Comments)    Eye problems   Morphine Rash   Penicillins Rash   Sulfites Rash   Sulfonamide Derivatives Rash    Current Outpatient Medications  Medication Sig Dispense Refill   Accu-Chek Softclix Lancets lancets Use to check blood sugars daily 100 each 3   acetaminophen (TYLENOL) 500 MG tablet Take 500 mg by mouth every 6 (six) hours as needed for moderate pain (pain score 4-6).     albuterol (VENTOLIN HFA) 108 (90 Base) MCG/ACT inhaler Inhale 2 puffs into the lungs every 6 (six) hours as needed for wheezing or shortness of breath. 8 g 2   amLODipine-atorvastatin (CADUET) 10-40 MG tablet Take 1 tablet by mouth daily. 90 tablet 3   aspirin 81 MG tablet Take 81 mg by mouth daily.     Calcium Carbonate-Vitamin D (CALTRATE 600+D) 600-400 MG-UNIT per tablet Take 1 tablet by mouth daily.     cefdinir (OMNICEF) 300 MG capsule Take 1 capsule (300 mg total) by mouth 2 (two) times daily. 1 po BID 20 capsule 0   cholecalciferol (VITAMIN D3) 25 MCG (1000 UNIT) tablet Take 1,000 Units by mouth daily.     fish oil-omega-3 fatty acids 1000 MG capsule Take 1 g by mouth daily.     fluticasone (FLONASE) 50 MCG/ACT nasal spray SPRAY 2 SPRAYS INTO EACH NOSTRIL EVERY DAY 48 mL 1   glucose blood test strip Check blood glucose QD and PRN DxE11.9 100 each 12   guaiFENesin (MUCINEX PO) Take 1,200 mg by mouth daily as needed (cough in chest).     hydrALAZINE (APRESOLINE) 25 MG tablet Take 25 mg morning, 25 mg at lunch, and 50 mg ( 2 tablets ) at bedtime 336 tablet 3   metoprolol succinate (TOPROL-XL) 100 MG 24 hr tablet Take 1  tablet (100 mg total) by mouth daily. TAKE WITH OR IMMEDIATELY FOLLOWING A MEAL. 90 tablet 3   Multiple Vitamin (MULTIVITAMIN) tablet Take 1 tablet by mouth daily.     polyethylene glycol powder (GLYCOLAX/MIRALAX) 17 GM/SCOOP powder  Take 17 g by mouth daily.     predniSONE (DELTASONE) 20 MG tablet 2 po at same time daily for 5 days 10 tablet 0   telmisartan (MICARDIS) 80 MG tablet Take 1 tablet (80 mg total) by mouth every evening. 90 tablet 3   triamcinolone cream (KENALOG) 0.1 % APPLY TO AFFECTED AREA TWICE A DAY (Patient taking differently: Apply 1 Application topically 2 (two) times daily as needed (rash).) 90 g 1   No current facility-administered medications for this visit.     Family History  Problem Relation Age of Onset   Heart attack Mother    Hypertension Mother    Throat cancer Mother        THROAT / VOCAL CORD   Heart disease Mother    Hypertension Father    Heart disease Father    Kidney disease Father        failure   Bone cancer Sister    Stroke Sister    Bone cancer Grandchild        in rib cage   Hypertension Sister    Metabolic syndrome Sister        pre diabetes   GER disease Sister    Heart disease Sister        CAD   Heart attack Brother    Cirrhosis Brother    Breast cancer Sister    Bone cancer Sister    Hyperlipidemia Sister    Heart attack Sister    Stroke Son    Colitis Neg Hx    Esophageal cancer Neg Hx    Stomach cancer Neg Hx    Rectal cancer Neg Hx        Physical Exam: Neuro: intact Lungs: clear Card: RR with loud high pitched murmur Ext: no edema     Diagnostic Studies & Laboratory data: I have personally reviewed the following studies and agree with the findings   TTE (07/2023) IMPRESSIONS     1. Left ventricular ejection fraction, by estimation, is 60 to 65%. The  left ventricle has normal function. The left ventricle has no regional  wall motion abnormalities. There is moderate asymmetric left ventricular   hypertrophy of the basal-septal  segment. Left ventricular diastolic parameters are consistent with Grade  II diastolic dysfunction (pseudonormalization). Elevated left atrial  pressure.   2. Right ventricular systolic function is normal. The right ventricular  size is normal. Tricuspid regurgitation signal is inadequate for assessing  PA pressure.   3. Left atrial size was moderately dilated.   4. Right atrial size was moderately dilated.   5. The mitral valve is normal in structure. Trivial mitral valve  regurgitation. No evidence of mitral stenosis.   6. The tricuspid valve is abnormal.   7. Severe aortic stenosis. Highest mean gradient is measured with Pedhoff  probe at 62 mmHg. Mean gradient by 2D probe likely underestimated based on  angle of acquisition. . The aortic valve is tricuspid. There is moderate  calcification of the aortic  valve. There is moderate thickening of the aortic valve. Aortic valve  regurgitation is not visualized. Severe aortic valve stenosis. Aortic  valve mean gradient measures 54.0 mmHg. Aortic valve peak gradient  measures 92.4 mmHg. Aortic valve area, by VTI  measures 0.94 cm.   8. The inferior vena cava is normal in size with greater than 50%  respiratory variability, suggesting right atrial pressure of 3 mmHg.   FINDINGS   Left Ventricle: Left ventricular ejection fraction, by estimation, is  60  to 65%. The left ventricle has normal function. The left ventricle has no  regional wall motion abnormalities. The left ventricular internal cavity  size was normal in size. There is   moderate asymmetric left ventricular hypertrophy of the basal-septal  segment. Left ventricular diastolic parameters are consistent with Grade  II diastolic dysfunction (pseudonormalization). Elevated left atrial  pressure.   Right Ventricle: The right ventricular size is normal. Right vetricular  wall thickness was not well visualized. Right ventricular systolic   function is normal. Tricuspid regurgitation signal is inadequate for  assessing PA pressure.   Left Atrium: Left atrial size was moderately dilated.   Right Atrium: Right atrial size was moderately dilated.   Pericardium: There is no evidence of pericardial effusion.   Mitral Valve: The mitral valve is normal in structure. There is mild  thickening of the mitral valve leaflet(s). There is mild calcification of  the mitral valve leaflet(s). Mild mitral annular calcification. Trivial  mitral valve regurgitation. No evidence   of mitral valve stenosis.   Tricuspid Valve: The tricuspid valve is abnormal. Tricuspid valve  regurgitation is mild . No evidence of tricuspid stenosis.   Aortic Valve: Severe aortic stenosis. Highest mean gradient is measured  with Pedhoff probe at 62 mmHg. Mean gradient by 2D probe likely  underestimated based on angle of acquisition. The aortic valve is  tricuspid. There is moderate calcification of the  aortic valve. There is moderate thickening of the aortic valve. There is  moderate aortic valve annular calcification. Aortic valve regurgitation is  not visualized. Severe aortic stenosis is present. Aortic valve mean  gradient measures 54.0 mmHg. Aortic  valve peak gradient measures 92.4 mmHg. Aortic valve area, by VTI measures  0.94 cm.   Pulmonic Valve: The pulmonic valve was not well visualized. Pulmonic valve  regurgitation is mild. No evidence of pulmonic stenosis.   Aorta: The aortic root is normal in size and structure.   Venous: The inferior vena cava is normal in size with greater than 50%  respiratory variability, suggesting right atrial pressure of 3 mmHg.   IAS/Shunts: No atrial level shunt detected by color flow Doppler.     LEFT VENTRICLE  PLAX 2D  LVIDd:         3.80 cm   Diastology  LVIDs:         2.10 cm   LV e' medial:    5.33 cm/s  LV PW:         1.00 cm   LV E/e' medial:  18.8  LV IVS:        1.30 cm   LV e' lateral:   8.92  cm/s  LVOT diam:     1.80 cm   LV E/e' lateral: 11.2  LV SV:         113  LV SV Index:   73  LVOT Area:     2.54 cm     RIGHT VENTRICLE  RV S prime:     11.70 cm/s  TAPSE (M-mode): 2.2 cm   LEFT ATRIUM             Index        RIGHT ATRIUM           Index  LA diam:        3.70 cm 2.37 cm/m   RA Area:     21.40 cm  LA Vol (A2C):   71.7 ml 45.98 ml/m  RA Volume:   63.30 ml  40.59 ml/m  LA Vol (A4C):   65.8 ml 42.20 ml/m  LA Biplane Vol: 71.7 ml 45.98 ml/m   AORTIC VALVE  AV Area (Vmax):    0.89 cm  AV Area (Vmean):   0.88 cm  AV Area (VTI):     0.94 cm  AV Vmax:           480.50 cm/s  AV Vmean:          339.000 cm/s  AV VTI:            1.200 m  AV Peak Grad:      92.4 mmHg  AV Mean Grad:      54.0 mmHg  LVOT Vmax:         168.00 cm/s  LVOT Vmean:        117.000 cm/s  LVOT VTI:          0.445 m  LVOT/AV VTI ratio: 0.37    AORTA  Ao Root diam: 3.20 cm   MITRAL VALVE  MV Area (PHT): 2.95 cm     SHUNTS  MV Decel Time: 257 msec     Systemic VTI:  0.44 m  MV E velocity: 100.00 cm/s  Systemic Diam: 1.80 cm  MV A velocity: 75.00 cm/s  MV E/A ratio:  1.33   CATH (08/2023) Conclusion      Mid LAD lesion is 30% stenosed.   LPAV lesion is 30% stenosed.   Mild non-obstructive CAD Normal right heart pressures (RA 5, RV 35/1/6, PA 35/7 mean 17, PCWP 15)      Recent Radiology Findings:   CTA (09/2023) FINDINGS: Image quality: Excellent.   Noise artifact is: Limited.   Valve Morphology: Tricuspid aortic valve with severe calcifications and severely restricted leaflet motion in systole. Bulky calcification of the NCC.   Aortic Valve Calcium score: 1393   Aortic annular dimension:   Phase assessed: 25%   Annular area: 375 mm2   Annular perimeter: 69.5 mm   Max diameter: 24.1 mm   Min diameter: 20.4 mm   Annular and subannular calcification: None.   Membranous septum length: 5.4 mm   Optimal coplanar projection: RAO 5 CAU 4   Coronary Artery Height  above Annulus:   Left Main: 9.1 mm   Right Coronary: 13.4 mm   Sinus of Valsalva Measurements:   Non-coronary: 26 mm   Right-coronary: 26 mm   Left-coronary: 27 mm   Sinus of Valsalva Height:   Non-coronary: 18.1 mm   Right-coronary: 18.5 mm   Left-coronary: 16.8 mm   Sinotubular Junction: 21.9 mm   Ascending Thoracic Aorta: 24.7 mm   Coronary Arteries: Normal coronary origin. Left dominance. The study was performed without use of NTG and is insufficient for plaque evaluation. Please refer to recent cardiac catheterization for coronary assessment. Coronary calcifications in the LAD and LCX.   Cardiac Morphology:   Right Atrium: Right atrial size is dilated.   Right Ventricle: The right ventricular cavity is within normal limits.   Left Atrium: Left atrial size is dilated. Mixing artifact noted in the LAA.   Left Ventricle: The ventricular cavity size is within normal limits. Moderate asymmetric hypertrophy of the basal septum.   Pulmonary arteries: Normal in size without proximal filling defect.   Pulmonary veins: Normal pulmonary venous drainage.   Pericardium: Normal thickness with no significant effusion or calcium present.   Mitral Valve: The mitral valve is normal structure without significant calcification.   Extra-cardiac findings: See attached radiology report  for non-cardiac structures.   IMPRESSION: 1. Annular measurements support a 23 mm S3. Sinuses are borderline for a 26 mm Evolut Pro.   2. No significant annular or subannular calcifications.   3. Shallow left main height (9.1 mm), but sinuses appear to accommodate a 23 mm virtual valve. Structural heart team discussion recommended.   4. Optimal Fluoroscopic Angle for Delivery: RAO 5 CAU 4   5. Mixing artifact noted in the LAA.      Recent Lab Findings: Lab Results  Component Value Date   WBC 5.4 08/28/2023   HGB 11.9 (L) 09/05/2023   HGB 10.2 (L) 09/05/2023   HCT 35.0 (L)  09/05/2023   HCT 30.0 (L) 09/05/2023   PLT 186 08/28/2023   GLUCOSE 93 08/28/2023   CHOL 131 06/18/2023   TRIG 86 06/18/2023   HDL 49 06/18/2023   LDLCALC 65 06/18/2023   ALT 27 06/18/2023   AST 33 06/18/2023   NA 123 (L) 09/05/2023   NA 147 (H) 09/05/2023   K 2.8 (L) 09/05/2023   K 3.2 (L) 09/05/2023   CL 106 08/28/2023   CREATININE 0.50 (L) 08/28/2023   BUN 14 08/28/2023   CO2 24 08/28/2023   TSH 1.380 06/18/2023   HGBA1C 5.6 06/18/2023      Assessment / Plan:     89 yo female with NYHA class 2 symptoms of severe AS with normal LV function and no CAD. Pt does have low LM but appears to not be in jeopardy due to large sinus and small leaflet. Her sizing with her age and calcified root to be 20mm Sapien with possibly added volume. We discussed her candidacy for bailout and was left with it being limited if after assessment of injury.   I have spent 60 min in review of the records, viewing studies and in face to face with patient and in coordination of future care    Eugenio Hoes 09/27/2023 8:57 AM

## 2023-09-27 NOTE — Patient Instructions (Signed)
TAVR

## 2023-10-15 ENCOUNTER — Telehealth: Payer: Self-pay | Admitting: Family Medicine

## 2023-10-15 DIAGNOSIS — R7303 Prediabetes: Secondary | ICD-10-CM

## 2023-10-15 DIAGNOSIS — E041 Nontoxic single thyroid nodule: Secondary | ICD-10-CM

## 2023-10-15 DIAGNOSIS — I10 Essential (primary) hypertension: Secondary | ICD-10-CM

## 2023-10-15 NOTE — Telephone Encounter (Signed)
 Future lab orders placed

## 2023-10-17 ENCOUNTER — Other Ambulatory Visit: Payer: Medicare Other

## 2023-10-17 DIAGNOSIS — I10 Essential (primary) hypertension: Secondary | ICD-10-CM | POA: Diagnosis not present

## 2023-10-17 DIAGNOSIS — E041 Nontoxic single thyroid nodule: Secondary | ICD-10-CM

## 2023-10-17 DIAGNOSIS — R7303 Prediabetes: Secondary | ICD-10-CM | POA: Diagnosis not present

## 2023-10-17 LAB — BAYER DCA HB A1C WAIVED: HB A1C (BAYER DCA - WAIVED): 5.5 % (ref 4.8–5.6)

## 2023-10-17 LAB — LIPID PANEL

## 2023-10-18 ENCOUNTER — Other Ambulatory Visit: Payer: Self-pay | Admitting: Cardiology

## 2023-10-18 LAB — CBC WITH DIFFERENTIAL/PLATELET
Basophils Absolute: 0 10*3/uL (ref 0.0–0.2)
Basos: 1 %
EOS (ABSOLUTE): 0.1 10*3/uL (ref 0.0–0.4)
Eos: 2 %
Hematocrit: 37.5 % (ref 34.0–46.6)
Hemoglobin: 12.3 g/dL (ref 11.1–15.9)
Immature Grans (Abs): 0 10*3/uL (ref 0.0–0.1)
Immature Granulocytes: 0 %
Lymphocytes Absolute: 0.9 10*3/uL (ref 0.7–3.1)
Lymphs: 20 %
MCH: 31 pg (ref 26.6–33.0)
MCHC: 32.8 g/dL (ref 31.5–35.7)
MCV: 95 fL (ref 79–97)
Monocytes Absolute: 0.5 10*3/uL (ref 0.1–0.9)
Monocytes: 12 %
Neutrophils Absolute: 2.9 10*3/uL (ref 1.4–7.0)
Neutrophils: 65 %
Platelets: 179 10*3/uL (ref 150–450)
RBC: 3.97 x10E6/uL (ref 3.77–5.28)
RDW: 13.2 % (ref 11.7–15.4)
WBC: 4.4 10*3/uL (ref 3.4–10.8)

## 2023-10-18 LAB — CMP14+EGFR
ALT: 22 IU/L (ref 0–32)
AST: 29 IU/L (ref 0–40)
Albumin: 4.6 g/dL (ref 3.7–4.7)
Alkaline Phosphatase: 133 IU/L — ABNORMAL HIGH (ref 44–121)
BUN/Creatinine Ratio: 26 (ref 12–28)
BUN: 15 mg/dL (ref 8–27)
Bilirubin Total: 0.3 mg/dL (ref 0.0–1.2)
CO2: 21 mmol/L (ref 20–29)
Calcium: 9.4 mg/dL (ref 8.7–10.3)
Chloride: 107 mmol/L — ABNORMAL HIGH (ref 96–106)
Creatinine, Ser: 0.58 mg/dL (ref 0.57–1.00)
Globulin, Total: 2.3 g/dL (ref 1.5–4.5)
Glucose: 96 mg/dL (ref 70–99)
Potassium: 4.2 mmol/L (ref 3.5–5.2)
Sodium: 144 mmol/L (ref 134–144)
Total Protein: 6.9 g/dL (ref 6.0–8.5)
eGFR: 88 mL/min/{1.73_m2} (ref >59)

## 2023-10-18 LAB — LIPID PANEL
Cholesterol, Total: 115 mg/dL (ref 100–199)
HDL: 50 mg/dL (ref >39)
LDL CALC COMMENT:: 2.3 ratio (ref 0.0–4.4)
LDL Chol Calc (NIH): 50 mg/dL (ref 0–99)
Triglycerides: 75 mg/dL (ref 0–149)
VLDL Cholesterol Cal: 15 mg/dL (ref 5–40)

## 2023-10-18 LAB — TSH: TSH: 1.27 u[IU]/mL (ref 0.450–4.500)

## 2023-10-19 ENCOUNTER — Other Ambulatory Visit: Payer: Self-pay

## 2023-10-19 DIAGNOSIS — I350 Nonrheumatic aortic (valve) stenosis: Secondary | ICD-10-CM

## 2023-10-22 ENCOUNTER — Telehealth: Payer: Self-pay | Admitting: Family Medicine

## 2023-10-22 ENCOUNTER — Encounter: Payer: Self-pay | Admitting: Family Medicine

## 2023-10-22 ENCOUNTER — Ambulatory Visit (INDEPENDENT_AMBULATORY_CARE_PROVIDER_SITE_OTHER): Payer: Medicare Other | Admitting: Family Medicine

## 2023-10-22 VITALS — BP 121/48 | HR 67 | Ht 60.0 in | Wt 130.0 lb

## 2023-10-22 DIAGNOSIS — R7303 Prediabetes: Secondary | ICD-10-CM

## 2023-10-22 DIAGNOSIS — E785 Hyperlipidemia, unspecified: Secondary | ICD-10-CM

## 2023-10-22 DIAGNOSIS — I6523 Occlusion and stenosis of bilateral carotid arteries: Secondary | ICD-10-CM

## 2023-10-22 DIAGNOSIS — I10 Essential (primary) hypertension: Secondary | ICD-10-CM | POA: Diagnosis not present

## 2023-10-22 MED ORDER — TELMISARTAN 80 MG PO TABS: 80.0000 mg | ORAL_TABLET | Freq: Every evening | ORAL | 3 refills | Status: AC

## 2023-10-22 NOTE — Telephone Encounter (Signed)
Future orders have been placed.

## 2023-10-22 NOTE — Telephone Encounter (Signed)
 PT has apt 7/11 with [name]. She has lab apt 7/9. Please add future orders.

## 2023-10-22 NOTE — Progress Notes (Signed)
 BP (!) 121/48   Pulse 67   Ht 5' (1.524 m)   Wt 130 lb (59 kg)   SpO2 100%   BMI 25.39 kg/m    Subjective:   Patient ID: Melissa Carpenter, female    DOB: April 24, 1934, 89 y.o.   MRN: [id number]  HPI: Melissa Carpenter is a 89 y.o. female presenting on 10/22/2023 for Medical Management of Chronic Issues, Hypertension, and Hyperlipidemia   HPI Hyperlipidemia and CAD recheck, also sees cardiology Patient is coming in for recheck of his hyperlipidemia. The patient is currently taking atorvastatin. They deny any issues with myalgias or history of liver damage from it. They deny any focal numbness or weakness or chest pain.   Hypertension Patient is currently on amlodipine and hydralazine and metoprolol and telmisartan, and their blood pressure today is 121/48. Patient denies any lightheadedness or dizziness. Patient denies headaches, blurred vision, chest pains, shortness of breath, or weakness. Denies any side effects from medication and is content with current medication.   Prediabetes Patient comes in today for recheck of his diabetes. Patient has been currently taking no medication, diet control. Patient is currently on an ACE inhibitor/ARB. Patient has not seen an ophthalmologist this year. Patient denies any new issues with their feet. The symptom started onset as an adult hyperlipidemia and CAD and hypertension ARE RELATED TO DM   Patient has aortic stenosis and going in for valve repair next week  Relevant past medical, surgical, family and social history reviewed and updated as indicated. Interim medical history since our last visit reviewed. Allergies and medications reviewed and updated.  Review of Systems  Constitutional:  Negative for chills and fever.  HENT:  Negative for congestion, ear discharge and ear pain.   Eyes:  Negative for redness and visual disturbance.  Respiratory:  Negative for chest tightness and shortness of breath.   Cardiovascular:  Negative for chest pain and leg  swelling.  Genitourinary:  Negative for difficulty urinating and dysuria.  Musculoskeletal:  Negative for back pain and gait problem.  Skin:  Negative for rash.  Neurological:  Negative for dizziness, light-headedness and headaches.  Psychiatric/Behavioral:  Negative for agitation and behavioral problems.   All other systems reviewed and are negative.   Per HPI unless specifically indicated above   Allergies as of 10/22/2023       Reactions   Codeine Hives   Forteo [parathyroid Hormone (recomb)] Other (See Comments)   Weakness and calcium increase   Raloxifene Other (See Comments)   Eye problems   Morphine Rash   Penicillins Rash   Sulfites Rash   Sulfonamide Derivatives Rash        Medication List        Accurate as of October 22, 2023  9:49 AM. If you have any questions, ask your nurse or doctor.          STOP taking these medications    cefdinir 300 MG capsule Commonly known as: OMNICEF Stopped by: [name] Eriyah Fernando   glucose blood test strip Stopped by: [name] Kimora Stankovic   MUCINEX PO Stopped by: [name] Mignon Bechler   predniSONE 20 MG tablet Commonly known as: DELTASONE Stopped by: [name] Alinah Sheard       TAKE these medications    Accu-Chek Softclix Lancets lancets Use to check blood sugars daily   acetaminophen 500 MG tablet Commonly known as: TYLENOL Take 500 mg by mouth every 6 (six) hours as needed for moderate pain (pain score 4-6).   albuterol  108 (90 Base) MCG/ACT inhaler Commonly known as: VENTOLIN HFA Inhale 2 puffs into the lungs every 6 (six) hours as needed for wheezing or shortness of breath.   amLODipine-atorvastatin 10-40 MG tablet Commonly known as: CADUET Take 1 tablet by mouth daily.   aspirin 81 MG tablet Take 81 mg by mouth daily.   Calcium Carbonate-Vitamin D 600-400 MG-UNIT tablet Commonly known as: Caltrate 600+D Take 1 tablet by mouth daily.   cholecalciferol 25 MCG (1000 UNIT) tablet Commonly known as:  VITAMIN D3 Take 1,000 Units by mouth daily.   fish oil-omega-3 fatty acids 1000 MG capsule Take 1 g by mouth daily.   fluticasone 50 MCG/ACT nasal spray Commonly known as: FLONASE SPRAY 2 SPRAYS INTO EACH NOSTRIL EVERY DAY   hydrALAZINE 25 MG tablet Commonly known as: APRESOLINE TAKE 25 MG MORNING, 25 MG AT LUNCH, AND 50 MG ( 2 TABLETS ) AT BEDTIME   metoprolol succinate 100 MG 24 hr tablet Commonly known as: TOPROL-XL Take 1 tablet (100 mg total) by mouth daily. TAKE WITH OR IMMEDIATELY FOLLOWING A MEAL.   multivitamin tablet Take 1 tablet by mouth daily.   polyethylene glycol powder 17 GM/SCOOP powder Commonly known as: GLYCOLAX/MIRALAX Take 17 g by mouth daily.   telmisartan 80 MG tablet Commonly known as: MICARDIS Take 1 tablet (80 mg total) by mouth every evening.   triamcinolone cream 0.1 % Commonly known as: KENALOG APPLY TO AFFECTED AREA TWICE A DAY What changed: See the new instructions.         Objective:   BP (!) 121/48   Pulse 67   Ht 5' (1.524 m)   Wt 130 lb (59 kg)   SpO2 100%   BMI 25.39 kg/m   Wt Readings from Last 3 Encounters:  10/22/23 130 lb (59 kg)  09/27/23 131 lb 3.2 oz (59.5 kg)  09/10/23 130 lb (59 kg)    Physical Exam Vitals and nursing note reviewed.  Constitutional:      General: She is not in acute distress.    Appearance: She is well-developed. She is not diaphoretic.  Eyes:     Conjunctiva/sclera: Conjunctivae normal.  Cardiovascular:     Rate and Rhythm: Normal rate and regular rhythm.     Heart sounds: Murmur (Grade 4 out of 6 holosystolic murmur best heard at right second intercostal) heard.  Pulmonary:     Effort: Pulmonary effort is normal. No respiratory distress.     Breath sounds: Normal breath sounds. No wheezing, rhonchi or rales.  Musculoskeletal:        General: No swelling or tenderness. Normal range of motion.  Skin:    General: Skin is warm and dry.     Findings: No rash.  Neurological:     Mental  Status: She is alert and oriented to person, place, and time.     Coordination: Coordination normal.  Psychiatric:        Behavior: Behavior normal.     Results for orders placed or performed in visit on 10/17/23  Bayer DCA Hb A1c Waived   Collection Time: 10/17/23  8:30 AM  Result Value Ref Range   HB A1C (BAYER DCA - WAIVED) 5.5 4.8 - 5.6 %  TSH   Collection Time: 10/17/23  8:32 AM  Result Value Ref Range   TSH 1.270 0.450 - 4.500 uIU/mL  CBC with Differential/Platelet   Collection Time: 10/17/23  8:32 AM  Result Value Ref Range   WBC 4.4 3.4 - 10.8 x10E3/uL  RBC 3.97 3.77 - 5.28 x10E6/uL   Hemoglobin 12.3 11.1 - 15.9 g/dL   Hematocrit 16.1 09.6 - 46.6 %   MCV 95 79 - 97 fL   MCH 31.0 26.6 - 33.0 pg   MCHC 32.8 31.5 - 35.7 g/dL   RDW 04.5 40.9 - 81.1 %   Platelets 179 150 - 450 x10E3/uL   Neutrophils 65 Not Estab. %   Lymphs 20 Not Estab. %   Monocytes 12 Not Estab. %   Eos 2 Not Estab. %   Basos 1 Not Estab. %   Neutrophils Absolute 2.9 1.4 - 7.0 x10E3/uL   Lymphocytes Absolute 0.9 0.7 - 3.1 x10E3/uL   Monocytes Absolute 0.5 0.1 - 0.9 x10E3/uL   EOS (ABSOLUTE) 0.1 0.0 - 0.4 x10E3/uL   Basophils Absolute 0.0 0.0 - 0.2 x10E3/uL   Immature Granulocytes 0 Not Estab. %   Immature Grans (Abs) 0.0 0.0 - 0.1 x10E3/uL  CMP14+EGFR   Collection Time: 10/17/23  8:32 AM  Result Value Ref Range   Glucose 96 70 - 99 mg/dL   BUN 15 8 - 27 mg/dL   Creatinine, Ser 9.14 0.57 - 1.00 mg/dL   eGFR 88 >78 GN/FAO/1.30   BUN/Creatinine Ratio 26 12 - 28   Sodium 144 134 - 144 mmol/L   Potassium 4.2 3.5 - 5.2 mmol/L   Chloride 107 (H) 96 - 106 mmol/L   CO2 21 20 - 29 mmol/L   Calcium 9.4 8.7 - 10.3 mg/dL   Total Protein 6.9 6.0 - 8.5 g/dL   Albumin 4.6 3.7 - 4.7 g/dL   Globulin, Total 2.3 1.5 - 4.5 g/dL   Bilirubin Total 0.3 0.0 - 1.2 mg/dL   Alkaline Phosphatase 133 (H) 44 - 121 IU/L   AST 29 0 - 40 IU/L   ALT 22 0 - 32 IU/L  Lipid panel   Collection Time: 10/17/23  8:32 AM   Result Value Ref Range   Cholesterol, Total 115 100 - 199 mg/dL   Triglycerides 75 0 - 149 mg/dL   HDL 50 >86 mg/dL   VLDL Cholesterol Cal 15 5 - 40 mg/dL   LDL Chol Calc (NIH) 50 0 - 99 mg/dL   Chol/HDL Ratio 2.3 0.0 - 4.4 ratio    Assessment & Plan:   Problem List Items Addressed This Visit       Cardiovascular and Mediastinum   Essential hypertension (Chronic)   Relevant Medications   telmisartan (MICARDIS) 80 MG tablet   Other Relevant Orders   CMP14+EGFR   Carotid artery disease (HCC)   Relevant Medications   telmisartan (MICARDIS) 80 MG tablet     Other   Dyslipidemia - Primary (Chronic)   Relevant Orders   CMP14+EGFR   Lipid panel   Pre-diabetes   Relevant Orders   Bayer DCA Hb A1c Waived    Blood work all look pretty good.  No changes, will do blood work in 4 months and follow-up in 4 months  Going for valvular replacement next week Follow up plan: Return in about 4 months (around 02/21/2024), or if symptoms worsen or fail to improve, for Hypertension and CAD and hyperlipidemia and prediabetes.  Counseling provided for all of the vaccine components Orders Placed This Encounter  Procedures   Bayer DCA Hb A1c Waived   CMP14+EGFR   Lipid panel    Arville Care, MD [facility] 10/22/2023, 9:49 AM

## 2023-10-26 ENCOUNTER — Encounter (HOSPITAL_COMMUNITY)
Admission: RE | Admit: 2023-10-26 | Discharge: 2023-10-26 | Disposition: A | Discharge status: Home / Self Care | Source: Ambulatory Visit | Attending: Cardiovascular Disease | Admitting: Cardiovascular Disease

## 2023-10-26 ENCOUNTER — Other Ambulatory Visit: Payer: Self-pay

## 2023-10-26 ENCOUNTER — Ambulatory Visit (HOSPITAL_COMMUNITY)
Admission: RE | Admit: 2023-10-26 | Discharge: 2023-10-26 | Disposition: A | Discharge status: Home / Self Care | Source: Ambulatory Visit | Attending: Cardiovascular Disease | Admitting: Cardiovascular Disease

## 2023-10-26 DIAGNOSIS — Z01818 Encounter for other preprocedural examination: Secondary | ICD-10-CM | POA: Insufficient documentation

## 2023-10-26 DIAGNOSIS — I350 Nonrheumatic aortic (valve) stenosis: Secondary | ICD-10-CM | POA: Diagnosis not present

## 2023-10-26 DIAGNOSIS — R001 Bradycardia, unspecified: Secondary | ICD-10-CM | POA: Diagnosis not present

## 2023-10-26 DIAGNOSIS — M40204 Unspecified kyphosis, thoracic region: Secondary | ICD-10-CM | POA: Diagnosis not present

## 2023-10-26 DIAGNOSIS — I517 Cardiomegaly: Secondary | ICD-10-CM | POA: Diagnosis not present

## 2023-10-26 LAB — COMPREHENSIVE METABOLIC PANEL
ALT: 32 U/L (ref 0–44)
AST: 36 U/L (ref 15–41)
Albumin: 4 g/dL (ref 3.5–5.0)
Alkaline Phosphatase: 100 U/L (ref 38–126)
Anion gap: 3 — ABNORMAL LOW (ref 5–15)
BUN: 18 mg/dL (ref 8–23)
CO2: 30 mmol/L (ref 22–32)
Calcium: 10.1 mg/dL (ref 8.9–10.3)
Chloride: 106 mmol/L (ref 98–111)
Creatinine, Ser: 0.56 mg/dL (ref 0.44–1.00)
GFR, Estimated: >60 mL/min (ref >60)
Glucose, Bld: 97 mg/dL (ref 70–99)
Potassium: 3.8 mmol/L (ref 3.5–5.1)
Sodium: 139 mmol/L (ref 135–145)
Total Bilirubin: 0.5 mg/dL (ref 0.0–1.2)
Total Protein: 7.2 g/dL (ref 6.5–8.1)

## 2023-10-26 LAB — URINALYSIS, ROUTINE W REFLEX MICROSCOPIC
Bilirubin Urine: NEGATIVE
Glucose, UA: NEGATIVE mg/dL
Hgb urine dipstick: NEGATIVE
Ketones, ur: NEGATIVE mg/dL
Leukocytes,Ua: NEGATIVE
Nitrite: NEGATIVE
Protein, ur: NEGATIVE mg/dL
Specific Gravity, Urine: <1.005 — ABNORMAL LOW (ref 1.005–1.030)
pH: 7 (ref 5.0–8.0)

## 2023-10-26 LAB — TYPE AND SCREEN
ABO/RH(D): O POS
Antibody Screen: NEGATIVE

## 2023-10-26 LAB — CBC
HCT: 37.6 % (ref 36.0–46.0)
Hemoglobin: 12.5 g/dL (ref 12.0–15.0)
MCH: 31.3 pg (ref 26.0–34.0)
MCHC: 33.2 g/dL (ref 30.0–36.0)
MCV: 94 fL (ref 80.0–100.0)
Platelets: 172 10*3/uL (ref 150–400)
RBC: 4 MIL/uL (ref 3.87–5.11)
RDW: 13.4 % (ref 11.5–15.5)
WBC: 5.3 10*3/uL (ref 4.0–10.5)
nRBC: 0 % (ref 0.0–0.2)

## 2023-10-26 LAB — PROTIME-INR
INR: 1.1 (ref 0.8–1.2)
Prothrombin Time: 13.9 s (ref 11.4–15.2)

## 2023-10-26 LAB — SURGICAL PCR SCREEN
MRSA, PCR: NEGATIVE
Staphylococcus aureus: NEGATIVE

## 2023-10-26 NOTE — Progress Notes (Signed)
 Patient signed all consents at PAT lab appointment. CHG soap and instructions were given to patient. CHG surgical prep reviewed with patient and all questions answered.  Patients chart send to anesthesia for review. Pt endorses a runny nose with some tan drainage and a non-productive cough. Julieta Gutting, RN made aware who will follow-up on WBC and CXR results. Lauren also made aware of large bruise on left upper arm with a dime sized knot.

## 2023-10-29 ENCOUNTER — Encounter (HOSPITAL_COMMUNITY): Payer: Self-pay | Admitting: Cardiovascular Disease

## 2023-10-29 MED ORDER — MAGNESIUM SULFATE 50 % IJ SOLN
40.0000 meq | INTRAMUSCULAR | Status: DC
  Filled 2023-10-29 (×2): qty 9.85

## 2023-10-29 MED ORDER — NOREPINEPHRINE 4 MG/250ML-% IV SOLN
0.0000 ug/min | INTRAVENOUS | Status: AC
  Administered 2023-10-30: 2 ug/min via INTRAVENOUS
  Filled 2023-10-29: qty 250

## 2023-10-29 MED ORDER — HEPARIN 30,000 UNITS/1000 ML (OHS) CELLSAVER SOLUTION
Status: DC
  Filled 2023-10-29 (×2): qty 1000

## 2023-10-29 MED ORDER — POTASSIUM CHLORIDE 2 MEQ/ML IV SOLN
80.0000 meq | INTRAVENOUS | Status: DC
  Filled 2023-10-29 (×2): qty 40

## 2023-10-29 MED ORDER — DEXMEDETOMIDINE HCL IN NACL 400 MCG/100ML IV SOLN
0.1000 ug/kg/h | INTRAVENOUS | Status: AC
  Administered 2023-10-30: 1 ug/kg/h via INTRAVENOUS
  Administered 2023-10-30: 29.52 ug via INTRAVENOUS
  Filled 2023-10-29: qty 100

## 2023-10-29 MED ORDER — CEFAZOLIN SODIUM-DEXTROSE 2-4 GM/100ML-% IV SOLN
2.0000 g | INTRAVENOUS | Status: AC
  Administered 2023-10-30: 2 g via INTRAVENOUS
  Filled 2023-10-29: qty 100

## 2023-10-29 NOTE — H&P (Signed)
 [address]       [place] [address]             [phone]                                   Melissa Carpenter [facility] Medical Record #[id number] Date of Birth: [birth year]   Melissa Carpenter* [name], [name], MD   Chief Complaint: DOE     History of Present Illness:     Pt is an 89 yo female who has been followed for moderate AS and has recently been having more DOE with housework. She has no CP nor syncope. She had recent echo where now her AS has progressed to a mean gradient of and with a valve area of 0.94. She has an EF of 60% and cath without CAD. She underwent CTA which has acceptable anatomy for TAVR (low LM but large sinus)              Past Medical History:  Diagnosis Date   Aortic insufficiency      Mild, echo, December, 2009   Arthritis     Carotid artery disease (HCC)     Cataract     Chest pain      Catheterization, 2003, no significant CAD   Diastolic dysfunction      Mild diastolic dysfunction, echo, December, 2012   DJD (degenerative joint disease)     Dyslipidemia     GERD (gastroesophageal reflux disease)     Hyperlipidemia     Hypertension     Mitral valve prolapse     Osteoporosis     Prediabetes     Thyroid nodule                 Past Surgical History:  Procedure Laterality Date   ABDOMINAL HYSTERECTOMY   1964   APPENDECTOMY   1946   BIOPSY THYROID Left     CARDIAC CATHETERIZATION   04/2002    no significant cad   CATARACT EXTRACTION, BILATERAL       COLONOSCOPY       NASAL SINUS SURGERY       RIGHT HEART CATH AND CORONARY ANGIOGRAPHY N/A 09/05/2023    Procedure: RIGHT HEART CATH AND CORONARY ANGIOGRAPHY;  Surgeon: Melissa Hazel, MD;  Location: [facility];  Service: Cardiovascular;  Laterality: N/A;   TONSILLECTOMY              Tobacco Use History  Social History       Tobacco Use  Smoking Status Never  Smokeless Tobacco Never      Social History        Substance  and Sexual Activity  Alcohol Use No   Alcohol/week: 0.0 standard drinks of alcohol      Social History         Socioeconomic History   Marital status: Married      Spouse name: Dayton Scrape Child psychotherapist)   Number of children: 4   Years of education: Not on file   Highest education level: Not on file  Occupational History   Occupation: retired      Associate Professor: [employer]  Tobacco Use   Smoking status: Never   Smokeless tobacco: Never  Vaping Use   Vaping status: Never Used  Substance and Sexual Activity   Alcohol use: No  Alcohol/week: 0.0 standard drinks of alcohol   Drug use: No   Sexual activity: Never  Other Topics Concern   Not on file  Social History Narrative    Lives in 2 story home with her husband    Children live nearby    Social Drivers of Health        Financial Resource Strain: Low Risk  (05/14/2023)    Overall Financial Resource Strain (CARDIA)     Difficulty of Paying Living Expenses: Not hard at all  Food Insecurity: No Food Insecurity (05/14/2023)    Hunger Vital Sign     Worried About Running Out of Food in the Last Year: Never true     Ran Out of Food in the Last Year: Never true  Transportation Needs: No Transportation Needs (05/14/2023)    PRAPARE - Therapist, art (Medical): No     Lack of Transportation (Non-Medical): No  Physical Activity: Inactive (05/14/2023)    Exercise Vital Sign     Days of Exercise per Week: 0 days     Minutes of Exercise per Session: 0 min  Stress: No Stress Concern Present (05/14/2023)    [employer] of Occupational Health - Occupational Stress Questionnaire     Feeling of Stress : Not at all  Social Connections: Moderately Integrated (05/14/2023)    Social Connection and Isolation Panel [NHANES]     Frequency of Communication with Friends and Family: More than three times a week     Frequency of Social Gatherings with Friends and Family: More than three times a week     Attends Religious  Services: 1 to 4 times per year     Active Member of [employer] or Organizations: No     Attends Banker Meetings: Never     Marital Status: Married  Catering manager Violence: Not At Risk (05/14/2023)    Humiliation, Afraid, Rape, and Kick questionnaire     Fear of Current or Ex-Partner: No     Emotionally Abused: No     Physically Abused: No     Sexually Abused: No      Allergies       Allergies  Allergen Reactions   Codeine Hives   Forteo [Parathyroid Hormone (Recomb)] Other (See Comments)      Weakness and calcium increase   Raloxifene Other (See Comments)      Eye problems   Morphine Rash   Penicillins Rash   Sulfites Rash   Sulfonamide Derivatives Rash              Current Outpatient Medications  Medication Sig Dispense Refill   Accu-Chek Softclix Lancets lancets Use to check blood sugars daily 100 each 3   acetaminophen (TYLENOL) 500 MG tablet Take 500 mg by mouth every 6 (six) hours as needed for moderate pain (pain score 4-6).       albuterol (VENTOLIN HFA) 108 (90 Base) MCG/ACT inhaler Inhale 2 puffs into the lungs every 6 (six) hours as needed for wheezing or shortness of breath. 8 g 2   amLODipine-atorvastatin (CADUET) 10-40 MG tablet Take 1 tablet by mouth daily. 90 tablet 3   aspirin 81 MG tablet Take 81 mg by mouth daily.       Calcium Carbonate-Vitamin D (CALTRATE 600+D) 600-400 MG-UNIT per tablet Take 1 tablet by mouth daily.       cefdinir (OMNICEF) 300 MG capsule Take 1 capsule (300 mg total) by mouth 2 (two) times daily.  1 po BID 20 capsule 0   cholecalciferol (VITAMIN D3) 25 MCG (1000 UNIT) tablet Take 1,000 Units by mouth daily.       fish oil-omega-3 fatty acids 1000 MG capsule Take 1 g by mouth daily.       fluticasone (FLONASE) 50 MCG/ACT nasal spray SPRAY 2 SPRAYS INTO EACH NOSTRIL EVERY DAY 48 mL 1   glucose blood test strip Check blood glucose QD and PRN DxE11.9 100 each 12   guaiFENesin (MUCINEX PO) Take 1,200 mg by mouth daily as needed  (cough in chest).       hydrALAZINE (APRESOLINE) 25 MG tablet Take 25 mg morning, 25 mg at lunch, and 50 mg ( 2 tablets ) at bedtime 336 tablet 3   metoprolol succinate (TOPROL-XL) 100 MG 24 hr tablet Take 1 tablet (100 mg total) by mouth daily. TAKE WITH OR IMMEDIATELY FOLLOWING A MEAL. 90 tablet 3   Multiple Vitamin (MULTIVITAMIN) tablet Take 1 tablet by mouth daily.       polyethylene glycol powder (GLYCOLAX/MIRALAX) 17 GM/SCOOP powder Take 17 g by mouth daily.       predniSONE (DELTASONE) 20 MG tablet 2 po at same time daily for 5 days 10 tablet 0   telmisartan (MICARDIS) 80 MG tablet Take 1 tablet (80 mg total) by mouth every evening. 90 tablet 3   triamcinolone cream (KENALOG) 0.1 % APPLY TO AFFECTED AREA TWICE A DAY (Patient taking differently: Apply 1 Application topically 2 (two) times daily as needed (rash).) 90 g 1      No current facility-administered medications for this visit.               Family History  Problem Relation Age of Onset   Heart attack Mother     Hypertension Mother     Throat cancer Mother          THROAT / VOCAL CORD   Heart disease Mother     Hypertension Father     Heart disease Father     Kidney disease Father          failure   Bone cancer Sister     Stroke Sister     Bone cancer Grandchild          in rib cage   Hypertension Sister     Metabolic syndrome Sister          pre diabetes   GER disease Sister     Heart disease Sister          CAD   Heart attack Brother     Cirrhosis Brother     Breast cancer Sister     Bone cancer Sister     Hyperlipidemia Sister     Heart attack Sister     Stroke Son     Colitis Neg Hx     Esophageal cancer Neg Hx     Stomach cancer Neg Hx     Rectal cancer Neg Hx                  Physical Exam: Neuro: intact Lungs: clear Card: RR with loud high pitched murmur Ext: no edema         Diagnostic Studies & Laboratory data: I have personally reviewed the following studies and agree with the  findings   TTE (07/2023) IMPRESSIONS     1. Left ventricular ejection fraction, by estimation, is 60 to 65%. The  left ventricle has normal function. The left ventricle  has no regional  wall motion abnormalities. There is moderate asymmetric left ventricular  hypertrophy of the basal-septal  segment. Left ventricular diastolic parameters are consistent with Grade  II diastolic dysfunction (pseudonormalization). Elevated left atrial  pressure.   2. Right ventricular systolic function is normal. The right ventricular  size is normal. Tricuspid regurgitation signal is inadequate for assessing  PA pressure.   3. Left atrial size was moderately dilated.   4. Right atrial size was moderately dilated.   5. The mitral valve is normal in structure. Trivial mitral valve  regurgitation. No evidence of mitral stenosis.   6. The tricuspid valve is abnormal.   7. Severe aortic stenosis. Highest mean gradient is measured with Pedhoff  probe at 62 mmHg. Mean gradient by 2D probe likely underestimated based on  angle of acquisition. . The aortic valve is tricuspid. There is moderate  calcification of the aortic  valve. There is moderate thickening of the aortic valve. Aortic valve  regurgitation is not visualized. Severe aortic valve stenosis. Aortic  valve mean gradient measures 54.0 mmHg. Aortic valve peak gradient  measures 92.4 mmHg. Aortic valve area, by VTI  measures 0.94 cm.   8. The inferior vena cava is normal in size with greater than 50%  respiratory variability, suggesting right atrial pressure of 3 mmHg.   FINDINGS   Left Ventricle: Left ventricular ejection fraction, by estimation, is 60  to 65%. The left ventricle has normal function. The left ventricle has no  regional wall motion abnormalities. The left ventricular internal cavity  size was normal in size. There is   moderate asymmetric left ventricular hypertrophy of the basal-septal  segment. Left ventricular diastolic  parameters are consistent with Grade  II diastolic dysfunction (pseudonormalization). Elevated left atrial  pressure.   Right Ventricle: The right ventricular size is normal. Right vetricular  wall thickness was not well visualized. Right ventricular systolic  function is normal. Tricuspid regurgitation signal is inadequate for  assessing PA pressure.   Left Atrium: Left atrial size was moderately dilated.   Right Atrium: Right atrial size was moderately dilated.   Pericardium: There is no evidence of pericardial effusion.   Mitral Valve: The mitral valve is normal in structure. There is mild  thickening of the mitral valve leaflet(s). There is mild calcification of  the mitral valve leaflet(s). Mild mitral annular calcification. Trivial  mitral valve regurgitation. No evidence   of mitral valve stenosis.   Tricuspid Valve: The tricuspid valve is abnormal. Tricuspid valve  regurgitation is mild . No evidence of tricuspid stenosis.   Aortic Valve: Severe aortic stenosis. Highest mean gradient is measured  with Pedhoff probe at 62 mmHg. Mean gradient by 2D probe likely  underestimated based on angle of acquisition. The aortic valve is  tricuspid. There is moderate calcification of the  aortic valve. There is moderate thickening of the aortic valve. There is  moderate aortic valve annular calcification. Aortic valve regurgitation is  not visualized. Severe aortic stenosis is present. Aortic valve mean  gradient measures 54.0 mmHg. Aortic  valve peak gradient measures 92.4 mmHg. Aortic valve area, by VTI measures  0.94 cm.   Pulmonic Valve: The pulmonic valve was not well visualized. Pulmonic valve  regurgitation is mild. No evidence of pulmonic stenosis.   Aorta: The aortic root is normal in size and structure.   Venous: The inferior vena cava is normal in size with greater than 50%  respiratory variability, suggesting right atrial pressure of 3 mmHg.  IAS/Shunts: No atrial  level shunt detected by color flow Doppler.     LEFT VENTRICLE  PLAX 2D  LVIDd:         3.80 cm   Diastology  LVIDs:         2.10 cm   LV e' medial:    5.33 cm/s  LV PW:         1.00 cm   LV E/e' medial:  18.8  LV IVS:        1.30 cm   LV e' lateral:   8.92 cm/s  LVOT diam:     1.80 cm   LV E/e' lateral: 11.2  LV SV:         113  LV SV Index:   73  LVOT Area:     2.54 cm     RIGHT VENTRICLE  RV S prime:     11.70 cm/s  TAPSE (M-mode): 2.2 cm   LEFT ATRIUM             Index        RIGHT ATRIUM           Index  LA diam:        3.70 cm 2.37 cm/m   RA Area:     21.40 cm  LA Vol (A2C):   71.7 ml 45.98 ml/m  RA Volume:   63.30 ml  40.59 ml/m  LA Vol (A4C):   65.8 ml 42.20 ml/m  LA Biplane Vol: 71.7 ml 45.98 ml/m   AORTIC VALVE  AV Area (Vmax):    0.89 cm  AV Area (Vmean):   0.88 cm  AV Area (VTI):     0.94 cm  AV Vmax:           480.50 cm/s  AV Vmean:          339.000 cm/s  AV VTI:            1.200 m  AV Peak Grad:      92.4 mmHg  AV Mean Grad:      54.0 mmHg  LVOT Vmax:         168.00 cm/s  LVOT Vmean:        117.000 cm/s  LVOT VTI:          0.445 m  LVOT/AV VTI ratio: 0.37    AORTA  Ao Root diam: 3.20 cm   MITRAL VALVE  MV Area (PHT): 2.95 cm     SHUNTS  MV Decel Time: 257 msec     Systemic VTI:  0.44 m  MV E velocity: 100.00 cm/s  Systemic Diam: 1.80 cm  MV A velocity: 75.00 cm/s  MV E/A ratio:  1.33    CATH (08/2023) Conclusion       Mid LAD lesion is 30% stenosed.   LPAV lesion is 30% stenosed.   Mild non-obstructive CAD Normal right heart pressures (RA 5, RV 35/1/6, PA 35/7 mean 17, PCWP 15)      Recent Radiology Findings:   CTA (09/2023) FINDINGS: Image quality: Excellent.   Noise artifact is: Limited.   Valve Morphology: Tricuspid aortic valve with severe calcifications and severely restricted leaflet motion in systole. Bulky calcification of the NCC.   Aortic Valve Calcium score: 1393   Aortic annular dimension:   Phase assessed:  25%   Annular area: 375 mm2   Annular perimeter: 69.5 mm   Max diameter: 24.1 mm   Min diameter: 20.4 mm   Annular and  subannular calcification: None.   Membranous septum length: 5.4 mm   Optimal coplanar projection: RAO 5 CAU 4   Coronary Artery Height above Annulus:   Left Main: 9.1 mm   Right Coronary: 13.4 mm   Sinus of Valsalva Measurements:   Non-coronary: 26 mm   Right-coronary: 26 mm   Left-coronary: 27 mm   Sinus of Valsalva Height:   Non-coronary: 18.1 mm   Right-coronary: 18.5 mm   Left-coronary: 16.8 mm   Sinotubular Junction: 21.9 mm   Ascending Thoracic Aorta: 24.7 mm   Coronary Arteries: Normal coronary origin. Left dominance. The study was performed without use of NTG and is insufficient for plaque evaluation. Please refer to recent cardiac catheterization for coronary assessment. Coronary calcifications in the LAD and LCX.   Cardiac Morphology:   Right Atrium: Right atrial size is dilated.   Right Ventricle: The right ventricular cavity is within normal limits.   Left Atrium: Left atrial size is dilated. Mixing artifact noted in the LAA.   Left Ventricle: The ventricular cavity size is within normal limits. Moderate asymmetric hypertrophy of the basal septum.   Pulmonary arteries: Normal in size without proximal filling defect.   Pulmonary veins: Normal pulmonary venous drainage.   Pericardium: Normal thickness with no significant effusion or calcium present.   Mitral Valve: The mitral valve is normal structure without significant calcification.   Extra-cardiac findings: See attached radiology report for non-cardiac structures.   IMPRESSION: 1. Annular measurements support a 23 mm S3. Sinuses are borderline for a 26 mm Evolut Pro.   2. No significant annular or subannular calcifications.   3. Shallow left main height (9.1 mm), but sinuses appear to accommodate a 23 mm virtual valve. Structural heart team  discussion recommended.   4. Optimal Fluoroscopic Angle for Delivery: RAO 5 CAU 4   5. Mixing artifact noted in the LAA.       Recent Lab Findings: Recent Labs       Lab Results  Component Value Date    WBC 5.4 08/28/2023    HGB 11.9 (L) 09/05/2023    HGB 10.2 (L) 09/05/2023    HCT 35.0 (L) 09/05/2023    HCT 30.0 (L) 09/05/2023    PLT 186 08/28/2023    GLUCOSE 93 08/28/2023    CHOL 131 06/18/2023    TRIG 86 06/18/2023    HDL 49 06/18/2023    LDLCALC 65 06/18/2023    ALT 27 06/18/2023    AST 33 06/18/2023    NA 123 (L) 09/05/2023    NA 147 (H) 09/05/2023    K 2.8 (L) 09/05/2023    K 3.2 (L) 09/05/2023    CL 106 08/28/2023    CREATININE 0.50 (L) 08/28/2023    BUN 14 08/28/2023    CO2 24 08/28/2023    TSH 1.380 06/18/2023    HGBA1C 5.6 06/18/2023            Assessment / Plan:     89 yo female with NYHA class 2 symptoms of severe AS with normal LV function and no CAD. Pt does have low LM but appears to not be in jeopardy due to large sinus and small leaflet. Her sizing with her age and calcified root to be 20mm Sapien with possibly added volume. We discussed her candidacy for bailout and was left with it being limited if after assessment of injury.

## 2023-10-30 ENCOUNTER — Encounter (HOSPITAL_COMMUNITY)
Admission: RE | Disposition: A | Discharge status: Home / Self Care | Payer: Self-pay | Source: Home / Self Care | Attending: Cardiovascular Disease

## 2023-10-30 ENCOUNTER — Inpatient Hospital Stay (HOSPITAL_COMMUNITY)

## 2023-10-30 ENCOUNTER — Other Ambulatory Visit: Payer: Self-pay

## 2023-10-30 ENCOUNTER — Inpatient Hospital Stay (HOSPITAL_COMMUNITY)
Admission: RE | Admit: 2023-10-30 | Discharge: 2023-10-31 | DRG: 266 | Disposition: A | Discharge status: Home / Self Care | Attending: Cardiovascular Disease | Admitting: Cardiovascular Disease

## 2023-10-30 ENCOUNTER — Inpatient Hospital Stay (HOSPITAL_COMMUNITY): Payer: Self-pay | Admitting: Certified Registered Nurse Anesthetist

## 2023-10-30 ENCOUNTER — Encounter (HOSPITAL_COMMUNITY): Payer: Self-pay | Admitting: Cardiovascular Disease

## 2023-10-30 ENCOUNTER — Other Ambulatory Visit: Payer: Self-pay | Admitting: Physician Assistant

## 2023-10-30 DIAGNOSIS — I2510 Atherosclerotic heart disease of native coronary artery without angina pectoris: Secondary | ICD-10-CM | POA: Diagnosis not present

## 2023-10-30 DIAGNOSIS — K219 Gastro-esophageal reflux disease without esophagitis: Secondary | ICD-10-CM | POA: Diagnosis present

## 2023-10-30 DIAGNOSIS — M810 Age-related osteoporosis without current pathological fracture: Secondary | ICD-10-CM | POA: Diagnosis present

## 2023-10-30 DIAGNOSIS — E785 Hyperlipidemia, unspecified: Secondary | ICD-10-CM

## 2023-10-30 DIAGNOSIS — N3090 Cystitis, unspecified without hematuria: Secondary | ICD-10-CM | POA: Diagnosis not present

## 2023-10-30 DIAGNOSIS — I350 Nonrheumatic aortic (valve) stenosis: Secondary | ICD-10-CM

## 2023-10-30 DIAGNOSIS — Z79899 Other long term (current) drug therapy: Secondary | ICD-10-CM | POA: Diagnosis not present

## 2023-10-30 DIAGNOSIS — Z885 Allergy status to narcotic agent status: Secondary | ICD-10-CM

## 2023-10-30 DIAGNOSIS — I110 Hypertensive heart disease with heart failure: Secondary | ICD-10-CM | POA: Diagnosis present

## 2023-10-30 DIAGNOSIS — Z7982 Long term (current) use of aspirin: Secondary | ICD-10-CM | POA: Diagnosis not present

## 2023-10-30 DIAGNOSIS — Z006 Encounter for examination for normal comparison and control in clinical research program: Secondary | ICD-10-CM

## 2023-10-30 DIAGNOSIS — I5031 Acute diastolic (congestive) heart failure: Secondary | ICD-10-CM | POA: Diagnosis not present

## 2023-10-30 DIAGNOSIS — Z90710 Acquired absence of both cervix and uterus: Secondary | ICD-10-CM

## 2023-10-30 DIAGNOSIS — Z808 Family history of malignant neoplasm of other organs or systems: Secondary | ICD-10-CM | POA: Diagnosis not present

## 2023-10-30 DIAGNOSIS — Z841 Family history of disorders of kidney and ureter: Secondary | ICD-10-CM

## 2023-10-30 DIAGNOSIS — Z880 Allergy status to penicillin: Secondary | ICD-10-CM

## 2023-10-30 DIAGNOSIS — M1990 Unspecified osteoarthritis, unspecified site: Secondary | ICD-10-CM | POA: Diagnosis present

## 2023-10-30 DIAGNOSIS — I779 Disorder of arteries and arterioles, unspecified: Secondary | ICD-10-CM | POA: Diagnosis present

## 2023-10-30 DIAGNOSIS — Z9889 Other specified postprocedural states: Secondary | ICD-10-CM

## 2023-10-30 DIAGNOSIS — Z888 Allergy status to other drugs, medicaments and biological substances status: Secondary | ICD-10-CM

## 2023-10-30 DIAGNOSIS — I10 Essential (primary) hypertension: Secondary | ICD-10-CM | POA: Diagnosis present

## 2023-10-30 DIAGNOSIS — R011 Cardiac murmur, unspecified: Secondary | ICD-10-CM | POA: Diagnosis present

## 2023-10-30 DIAGNOSIS — E8881 Metabolic syndrome: Secondary | ICD-10-CM | POA: Diagnosis present

## 2023-10-30 DIAGNOSIS — K449 Diaphragmatic hernia without obstruction or gangrene: Secondary | ICD-10-CM | POA: Diagnosis present

## 2023-10-30 DIAGNOSIS — Z9089 Acquired absence of other organs: Secondary | ICD-10-CM

## 2023-10-30 DIAGNOSIS — K769 Liver disease, unspecified: Secondary | ICD-10-CM | POA: Diagnosis not present

## 2023-10-30 DIAGNOSIS — Z83438 Family history of other disorder of lipoprotein metabolism and other lipidemia: Secondary | ICD-10-CM | POA: Diagnosis not present

## 2023-10-30 DIAGNOSIS — Z823 Family history of stroke: Secondary | ICD-10-CM

## 2023-10-30 DIAGNOSIS — Z803 Family history of malignant neoplasm of breast: Secondary | ICD-10-CM | POA: Diagnosis not present

## 2023-10-30 DIAGNOSIS — Z9842 Cataract extraction status, left eye: Secondary | ICD-10-CM

## 2023-10-30 DIAGNOSIS — R7303 Prediabetes: Secondary | ICD-10-CM | POA: Diagnosis present

## 2023-10-30 DIAGNOSIS — I872 Venous insufficiency (chronic) (peripheral): Secondary | ICD-10-CM | POA: Diagnosis present

## 2023-10-30 DIAGNOSIS — Z8379 Family history of other diseases of the digestive system: Secondary | ICD-10-CM

## 2023-10-30 DIAGNOSIS — Z9049 Acquired absence of other specified parts of digestive tract: Secondary | ICD-10-CM

## 2023-10-30 DIAGNOSIS — Z9841 Cataract extraction status, right eye: Secondary | ICD-10-CM

## 2023-10-30 DIAGNOSIS — Z952 Presence of prosthetic heart valve: Secondary | ICD-10-CM | POA: Diagnosis not present

## 2023-10-30 DIAGNOSIS — E876 Hypokalemia: Secondary | ICD-10-CM | POA: Diagnosis present

## 2023-10-30 DIAGNOSIS — Z8249 Family history of ischemic heart disease and other diseases of the circulatory system: Secondary | ICD-10-CM

## 2023-10-30 DIAGNOSIS — Z882 Allergy status to sulfonamides status: Secondary | ICD-10-CM

## 2023-10-30 HISTORY — PX: INTRAOPERATIVE TRANSTHORACIC ECHOCARDIOGRAM: SHX6523

## 2023-10-30 HISTORY — DX: Presence of prosthetic heart valve: Z95.2

## 2023-10-30 HISTORY — DX: Nonrheumatic aortic (valve) stenosis: I35.0

## 2023-10-30 LAB — GLUCOSE, CAPILLARY: Glucose-Capillary: 87 mg/dL (ref 70–99)

## 2023-10-30 LAB — ECHOCARDIOGRAM LIMITED
AR max vel: 2.26 cm2
AV Area VTI: 2.29 cm2
AV Area mean vel: 2.56 cm2
AV Mean grad: 9 mmHg
AV Peak grad: 16.2 mmHg
Ao pk vel: 2.01 m/s
Area-P 1/2: 3.99 cm2
S' Lateral: 2.7 cm

## 2023-10-30 LAB — POCT I-STAT, CHEM 8
BUN: 16 mg/dL (ref 8–23)
BUN: 16 mg/dL (ref 8–23)
Calcium, Ion: 1.24 mmol/L (ref 1.15–1.40)
Calcium, Ion: 1.25 mmol/L (ref 1.15–1.40)
Chloride: 106 mmol/L (ref 98–111)
Chloride: 107 mmol/L (ref 98–111)
Creatinine, Ser: 0.4 mg/dL — ABNORMAL LOW (ref 0.44–1.00)
Creatinine, Ser: 0.5 mg/dL (ref 0.44–1.00)
Glucose, Bld: 134 mg/dL — ABNORMAL HIGH (ref 70–99)
Glucose, Bld: 134 mg/dL — ABNORMAL HIGH (ref 70–99)
HCT: 27 % — ABNORMAL LOW (ref 36.0–46.0)
HCT: 29 % — ABNORMAL LOW (ref 36.0–46.0)
Hemoglobin: 9.2 g/dL — ABNORMAL LOW (ref 12.0–15.0)
Hemoglobin: 9.9 g/dL — ABNORMAL LOW (ref 12.0–15.0)
Potassium: 3.5 mmol/L (ref 3.5–5.1)
Potassium: 3.6 mmol/L (ref 3.5–5.1)
Sodium: 142 mmol/L (ref 135–145)
Sodium: 143 mmol/L (ref 135–145)
TCO2: 24 mmol/L (ref 22–32)
TCO2: 24 mmol/L (ref 22–32)

## 2023-10-30 LAB — ABO/RH: ABO/RH(D): O POS

## 2023-10-30 SURGERY — TRANSCATHETER AORTIC VALVE REPLACEMENT, TRANSFEMORAL (CATHLAB)
Anesthesia: Monitor Anesthesia Care

## 2023-10-30 MED ORDER — ACETAMINOPHEN 650 MG RE SUPP
650.0000 mg | Freq: Four times a day (QID) | RECTAL | Status: DC | PRN
Start: 2023-10-30 — End: 2023-10-31

## 2023-10-30 MED ORDER — LIDOCAINE HCL (PF) 1 % IJ SOLN
INTRAMUSCULAR | Status: DC | PRN
  Administered 2023-10-30 (×2): 15 mL

## 2023-10-30 MED ORDER — CHLORHEXIDINE GLUCONATE 4 % EX SOLN: 60.0000 mL | Freq: Once | CUTANEOUS | Status: DC

## 2023-10-30 MED ORDER — LIDOCAINE HCL (PF) 1 % IJ SOLN
INTRAMUSCULAR | Status: AC
  Filled 2023-10-30: qty 30

## 2023-10-30 MED ORDER — HEPARIN SODIUM (PORCINE) 1000 UNIT/ML IJ SOLN
INTRAMUSCULAR | Status: DC | PRN
  Administered 2023-10-30: 9000 [IU] via INTRAVENOUS

## 2023-10-30 MED ORDER — ONDANSETRON HCL 4 MG/2ML IJ SOLN
INTRAMUSCULAR | Status: DC | PRN
  Administered 2023-10-30: 4 mg via INTRAVENOUS

## 2023-10-30 MED ORDER — HEPARIN (PORCINE) IN NACL 1000-0.9 UT/500ML-% IV SOLN
INTRAVENOUS | Status: DC | PRN
  Administered 2023-10-30 (×2): 500 mL

## 2023-10-30 MED ORDER — LACTATED RINGERS IV SOLN: INTRAVENOUS | Status: DC | PRN

## 2023-10-30 MED ORDER — IODIXANOL 320 MG/ML IV SOLN
INTRAVENOUS | Status: DC | PRN
  Administered 2023-10-30: 30 mL

## 2023-10-30 MED ORDER — ACETAMINOPHEN 325 MG PO TABS
650.0000 mg | ORAL_TABLET | Freq: Four times a day (QID) | ORAL | Status: DC | PRN
  Administered 2023-10-30: 650 mg via ORAL
  Filled 2023-10-30: qty 2

## 2023-10-30 MED ORDER — SODIUM CHLORIDE 0.9 % IV SOLN: INTRAVENOUS | Status: AC

## 2023-10-30 MED ORDER — POTASSIUM CHLORIDE CRYS ER 10 MEQ PO TBCR
10.0000 meq | EXTENDED_RELEASE_TABLET | Freq: Once | ORAL | Status: AC
  Administered 2023-10-30: 10 meq via ORAL
  Filled 2023-10-30: qty 1

## 2023-10-30 MED ORDER — FENTANYL CITRATE (PF) 100 MCG/2ML IJ SOLN
INTRAMUSCULAR | Status: AC
Start: 2023-10-30 — End: ?
  Filled 2023-10-30: qty 2

## 2023-10-30 MED ORDER — SODIUM CHLORIDE 0.9% FLUSH: 3.0000 mL | INTRAVENOUS | Status: DC | PRN

## 2023-10-30 MED ORDER — CHLORHEXIDINE GLUCONATE 0.12 % MT SOLN
15.0000 mL | Freq: Once | OROMUCOSAL | Status: AC
  Administered 2023-10-30: 15 mL via OROMUCOSAL
  Filled 2023-10-30: qty 15

## 2023-10-30 MED ORDER — ASPIRIN 81 MG PO TBEC
81.0000 mg | DELAYED_RELEASE_TABLET | Freq: Every day | ORAL | Status: DC
  Administered 2023-10-31: 81 mg via ORAL
  Filled 2023-10-30: qty 1

## 2023-10-30 MED ORDER — CHLORHEXIDINE GLUCONATE 4 % EX SOLN: 30.0000 mL | CUTANEOUS | Status: DC

## 2023-10-30 MED ORDER — OXYCODONE HCL 5 MG PO TABS: 5.0000 mg | ORAL_TABLET | ORAL | Status: DC | PRN

## 2023-10-30 MED ORDER — SODIUM CHLORIDE 0.9 % IV SOLN: 250.0000 mL | INTRAVENOUS | Status: DC | PRN

## 2023-10-30 MED ORDER — FLUTICASONE PROPIONATE 50 MCG/ACT NA SUSP
1.0000 | Freq: Every day | NASAL | Status: DC
  Administered 2023-10-30: 1 via NASAL
  Filled 2023-10-30: qty 16

## 2023-10-30 MED ORDER — CEFAZOLIN SODIUM-DEXTROSE 2-4 GM/100ML-% IV SOLN
2.0000 g | Freq: Three times a day (TID) | INTRAVENOUS | Status: AC
  Administered 2023-10-30 – 2023-10-31 (×2): 2 g via INTRAVENOUS
  Filled 2023-10-30 (×2): qty 100

## 2023-10-30 MED ORDER — ONDANSETRON HCL 4 MG/2ML IJ SOLN: 4.0000 mg | Freq: Four times a day (QID) | INTRAMUSCULAR | Status: DC | PRN

## 2023-10-30 MED ORDER — PROPOFOL 10 MG/ML IV BOLUS
INTRAVENOUS | Status: DC | PRN
  Administered 2023-10-30 (×2): 20 mg via INTRAVENOUS

## 2023-10-30 MED ORDER — SODIUM CHLORIDE 0.9% FLUSH
3.0000 mL | Freq: Two times a day (BID) | INTRAVENOUS | Status: DC
  Administered 2023-10-30 – 2023-10-31 (×2): 3 mL via INTRAVENOUS

## 2023-10-30 MED ORDER — PROPOFOL 500 MG/50ML IV EMUL
INTRAVENOUS | Status: DC | PRN
  Administered 2023-10-30: 20 ug/kg/min via INTRAVENOUS

## 2023-10-30 MED ORDER — NITROGLYCERIN IN D5W 200-5 MCG/ML-% IV SOLN: 0.0000 ug/min | INTRAVENOUS | Status: DC

## 2023-10-30 MED ORDER — SODIUM CHLORIDE 0.9 % IV SOLN: INTRAVENOUS | Status: DC

## 2023-10-30 MED ORDER — FUROSEMIDE 10 MG/ML IJ SOLN
20.0000 mg | Freq: Once | INTRAMUSCULAR | Status: AC
  Administered 2023-10-30: 20 mg via INTRAVENOUS
  Filled 2023-10-30: qty 2

## 2023-10-30 MED ORDER — PROTAMINE SULFATE 10 MG/ML IV SOLN
INTRAVENOUS | Status: DC | PRN
  Administered 2023-10-30: 90 mg via INTRAVENOUS

## 2023-10-30 SURGICAL SUPPLY — 31 items
BAG SNAP BAND KOVER 36X36 (MISCELLANEOUS) ×4 IMPLANT
BALLN TRUE 18X4.5 (BALLOONS) ×1 IMPLANT
BALLOON TRUE 18X4.5 (BALLOONS) IMPLANT
CABLE ADAPT PACING TEMP 12FT (ADAPTER) IMPLANT
CATH 20 ULTRA DELIVERY (CATHETERS) IMPLANT
CATH DIAG 6FR PIGTAIL ANGLED (CATHETERS) IMPLANT
CATH INFINITI 5FR ANG PIGTAIL (CATHETERS) IMPLANT
CATH INFINITI 6F AL1 (CATHETERS) IMPLANT
CATH S G BIP PACING (CATHETERS) IMPLANT
CLOSURE MYNX CONTROL 6F/7F (Vascular Products) IMPLANT
CLOSURE PERCLOSE PROSTYLE (VASCULAR PRODUCTS) IMPLANT
CRIMPER (MISCELLANEOUS) IMPLANT
DEVICE INFLATION ATRION QL2530 (MISCELLANEOUS) IMPLANT
KIT MICROPUNCTURE NIT STIFF (SHEATH) IMPLANT
KIT SAPIAN 3 ULTRA RESILIA 20 (Valve) IMPLANT
KIT SINGLE USE MANIFOLD (KITS) IMPLANT
KIT SYRINGE INJ CVI SPIKEX1 (MISCELLANEOUS) IMPLANT
PACK CARDIAC CATHETERIZATION (CUSTOM PROCEDURE TRAY) ×2 IMPLANT
SET ATX-X65L (MISCELLANEOUS) IMPLANT
SHEATH CATAPULT 7FR 45 (SHEATH) IMPLANT
SHEATH INTRODUCER SET 20-26 (SHEATH) IMPLANT
SHEATH PINNACLE 6F 10CM (SHEATH) IMPLANT
SHEATH PINNACLE 8F 10CM (SHEATH) IMPLANT
SHIELD CATH-GARD CONTAMINATION (MISCELLANEOUS) IMPLANT
STOPCOCK MORSE 400PSI 3WAY (MISCELLANEOUS) ×4 IMPLANT
WIRE AMPLATZ SS-J .035X180CM (WIRE) IMPLANT
WIRE EMERALD 3MM-J .035X150CM (WIRE) IMPLANT
WIRE EMERALD 3MM-J .035X260CM (WIRE) IMPLANT
WIRE EMERALD ST .035X260CM (WIRE) IMPLANT
WIRE MICROINTRODUCER 60CM (WIRE) IMPLANT
WIRE SAFARI SM CURVE 275 (WIRE) IMPLANT

## 2023-10-30 NOTE — Interval H&P Note (Signed)
 History and Physical Interval Note:  10/30/2023 6:27 AM  Melissa Carpenter  has presented today for surgery, with the diagnosis of Severe Aortic Stenosis.  The various methods of treatment have been discussed with the patient and family. After consideration of risks, benefits and other options for treatment, the patient has consented to  Procedure(s): Transcatheter Aortic Valve Replacement, Transfemoral (N/A) ECHOCARDIOGRAM, TRANSTHORACIC (N/A) as a surgical intervention.  The patient's history has been reviewed, patient examined, no change in status, stable for surgery.  I have reviewed the patient's chart and labs.  Questions were answered to the patient's satisfaction.     Eugenio Hoes

## 2023-10-30 NOTE — Progress Notes (Signed)
Pt [facility].., A/ox .4.Marland Kitchenpt denies any pain, MD aware,CCMD called. CHG bath given,no further needs at this time

## 2023-10-30 NOTE — Progress Notes (Signed)
  Echocardiogram 2D Echocardiogram has been performed.  Melissa Carpenter 10/30/2023, 9:09 AM

## 2023-10-30 NOTE — Discharge Summary (Incomplete)
 HEART AND VASCULAR CENTER   MULTIDISCIPLINARY HEART VALVE TEAM  Discharge Summary    Patient ID: Melissa Carpenter MRN: [id number]; DOB: [birth year]  Admit date: 10/30/2023 Discharge date: 10/31/2023  Primary Care Provider: [name], [name], MD  Primary Cardiologist: Rollene Rotunda, MD / Dr. Pearletha Alfred & Dr. Leafy Ro (TAVR)  Discharge Diagnoses    Principal Problem:   S/P TAVR (transcatheter aortic valve replacement) Active Problems:   Carotid artery disease (HCC)   Dyslipidemia   Essential hypertension   Gastroesophageal reflux disease with hiatal hernia   Metabolic syndrome   Chronic venous insufficiency   Pre-diabetes   Severe aortic stenosis   Allergies Allergies  Allergen Reactions   Codeine Hives   Forteo [Parathyroid Hormone (Recomb)] Other (See Comments)    Weakness and calcium increase   Raloxifene Other (See Comments)    Eye problems   Morphine Rash   Penicillins Rash   Sulfonamide Derivatives Rash    Diagnostic Studies/Procedures    TAVR OPERATIVE NOTE     Date of Procedure:                10/30/2023   Preoperative Diagnosis:      Severe Aortic Stenosis    Postoperative Diagnosis:    Same    Procedure:        Transcatheter Aortic Valve Replacement - Transfemoral Approach             Edwards Sapien 3 THV (size 20 mm, model # [id number], serial # [id number] )              Co-Surgeons:                        Verne Carrow, MD and Eugenio Hoes , MD    Anesthesiologist:                  Maple Hudson   Echocardiographer:              Croitoru   Pre-operative Echo Findings: Severe aortic stenosis Normal left ventricular systolic function   Post-operative Echo Findings: Trivial paravalvular leak Normal left ventricular systolic function  _____________    Echo 10/31/23: completed but pending formal read at the time of discharge   History of Present Illness     Melissa Carpenter is a 89 y.o. female with a history of HTN, carotid artery disease, borderline DM,  HLD, GERD and severe aortic stenosis who presented to [facility] on 10/30/23 for planned TAVR.   She has been followed by Dr. Antoine Poche for moderate aortic stenosis. She reported progressive dyspnea on exertion with mild chores around the house. Echo 07/31/23 showed EF 60% and severe AS with a mean grad 54 mmHg, AVA 0.94 cm2. [facility] 09/05/23 showed mild non-obstructive CAD and normal right heart pressures (RA 5, RV 35/1/6, PA 35/7 mean 17, PCWP 15).  The patient was evaluated by the multidisciplinary valve team and felt to have severe, symptomatic aortic stenosis and to be a suitable candidate for TAVR, which was set up for 10/30/23.   Hospital Course     Consultants: none   Severe AS:  -- S/p successful TAVR with a 20 mm Edwards Sapien 3 Ultra Resilia THV via the TF approach on 10/30/23.  -- Post operative echo completed but pending formal read. -- Groin sites are stable.  -- ECG/tele with sinus and no high grade heart block. -- Resumed on home Aspirin 81mg  daily. -- Met with cardiac rehab to discuss CRP phase  II.  -- Plan for discharge home today with close follow up in the outpatient setting.   HTN: -- BP elevated in the setting of holding home antihypertensives.  -- Resume home amlodipine 10mg  daily (in [place]), hydralazine 25mg  TID (50mg  at bedtime), Toprol XL 100mg  daily, telmisartan 80mg  daily.   Acute HFpEF: -- LVEDP 20 mm hg at the time of TAVR.  -- Treated with one dose of IV lasix 20mg  and Kdur . -- She appears euvolemic.   Hypokalemia:  -- K 3.1 today.  -- Supplemented with Kdur .   HLD:  -- Resume home atorvastatin 40mg  daily (in [place])  Lesion of liver:  -- Pre TAVR CTs showed a "7 mm enhancing area in the dome of the liver, nonspecific, possibly flash fill hemangioma. This can be further evaluated with MRI."  -- This will be discussed in the outpatient setting.   Bladder wall thickening: -- Diffuse bladder wall thickening more prominent anteriorly noted on pre  TAVT CTs. Correlate clinically for cystitis. -- UA on 10/26/23 was normal.  -- No further work up required.    _____________  Discharge Vitals Blood pressure (!) 155/50, pulse 80, temperature 99.4 F (37.4 C), temperature source Oral, resp. rate 19, height 5' (1.524 m), weight 58.4 kg, SpO2 92%.  Filed Weights   10/30/23 0549 10/31/23 0540  Weight: 61.2 kg 58.4 kg     GEN: Well nourished, well developed, in no acute distress HEENT: normal Neck: no JVD or masses Cardiac: RRR; 3/6 SEM loudest at RUSB but heard throughout precordium. No rubs, or gallops,no edema  Respiratory:  clear to auscultation bilaterally, normal work of breathing GI: soft, nontender, nondistended, + BS MS: no deformity or atrophy Skin: warm and dry, no rash.  Groin sites clear without hematoma. Mild ecchymosis on right groin and left bandage with small spot of red blood. Neuro:  Alert and Oriented x 3, Strength and sensation are intact Psych: euthymic mood, full affect    Disposition   Pt is being discharged home today in good condition.  Follow-up Plans & Appointments     Follow-up Information     [name], MD. Go on 11/08/2023.   Specialty: Cardiology Why: @ 11:20am, please arrive at least 15 minutes early Contact information: [address] [place] [address] [phone]                Discharge Instructions     Amb Referral to Cardiac Rehabilitation   Complete by: As directed    Diagnosis: Valve Replacement   Valve: Aortic   After initial evaluation and assessments completed: Virtual Based Care may be provided alone or in conjunction with Phase 2 Cardiac Rehab based on patient barriers.: Yes   Intensive Cardiac Rehabilitation (ICR) [facility] location only OR Traditional Cardiac Rehabilitation (TCR) *If criteria for ICR are not met will enroll in TCR [facility] only): Yes       Discharge Medications   Allergies as of 10/31/2023       Reactions   Codeine  Hives   Forteo [parathyroid Hormone (recomb)] Other (See Comments)   Weakness and calcium increase   Raloxifene Other (See Comments)   Eye problems   Morphine Rash   Penicillins Rash   Sulfonamide Derivatives Rash        Medication List     TAKE these medications    Accu-Chek Softclix Lancets lancets Use to check blood sugars daily   acetaminophen 500 MG tablet Commonly known as: TYLENOL  Take 500 mg by mouth every 6 (six) hours as needed for moderate pain (pain score 4-6).   albuterol 108 (90 Base) MCG/ACT inhaler Commonly known as: VENTOLIN HFA Inhale 2 puffs into the lungs every 6 (six) hours as needed for wheezing or shortness of breath.   amLODipine-atorvastatin 10-40 MG tablet Commonly known as: CADUET Take 1 tablet by mouth daily.   Aspercreme Lidocaine 4 % Crea Generic drug: Lidocaine HCl Apply 1 Application topically at bedtime.   aspirin 81 MG tablet Take 81 mg by mouth daily.   Calcium Carbonate-Vitamin D 600-400 MG-UNIT tablet Commonly known as: Caltrate 600+D Take 1 tablet by mouth daily.   cholecalciferol 25 MCG (1000 UNIT) tablet Commonly known as: VITAMIN D3 Take 1,000 Units by mouth daily.   fish oil-omega-3 fatty acids 1000 MG capsule Take 1 g by mouth daily.   fluticasone 50 MCG/ACT nasal spray Commonly known as: FLONASE SPRAY 2 SPRAYS INTO EACH NOSTRIL EVERY DAY   hydrALAZINE 25 MG tablet Commonly known as: APRESOLINE TAKE 25 MG MORNING, 25 MG AT LUNCH, AND 50 MG ( 2 TABLETS ) AT BEDTIME   metoprolol succinate 100 MG 24 hr tablet Commonly known as: TOPROL-XL Take 1 tablet (100 mg total) by mouth daily. TAKE WITH OR IMMEDIATELY FOLLOWING A MEAL.   multivitamin tablet Take 1 tablet by mouth daily.   polyethylene glycol powder 17 GM/SCOOP powder Commonly known as: GLYCOLAX/MIRALAX Take 17 g by mouth daily.   telmisartan 80 MG tablet Commonly known as: MICARDIS Take 1 tablet (80 mg total) by mouth every evening.   triamcinolone  cream 0.1 % Commonly known as: KENALOG APPLY TO AFFECTED AREA TWICE A DAY What changed: See the new instructions.         Outstanding Labs/Studies   none ______________________  Duration of Discharge Encounter: APP Time: 20 minutes    Signed, Cline Crock, PA-C 10/31/2023, 12:20 PM [phone]  I have personally seen and examined this patient. I agree with the assessment and plan as outlined above.  Pt with severe aortic stenosis.  She is doing well one day post TAVR.  Labs reviewed by me EKG reviewed by me Echo images reviewed by me. Trivial to mild PVL. Mean gradient 20 mmHg (secondary to 20 mm Sapien valve) My exam: NAD  CV: RRR with systolic murmur  Lungs: clear bilaterally  Ext: no LE edema. Groins stable without hematoma.  Plan: Will discharge home today on ASA. One week follow up in the office.   I have spent 25 minutes reviewing the chart, labs, EKG, echo images and notes, examination and plan formulation prior to discharge.   Verne Carrow, MD, [facility] 10/31/2023 1:05 PM'

## 2023-10-30 NOTE — Op Note (Signed)
 HEART AND VASCULAR CENTER   MULTIDISCIPLINARY HEART VALVE TEAM   TAVR OPERATIVE NOTE   Date of Procedure:  10/30/2023  Preoperative Diagnosis: Severe Aortic Stenosis   Postoperative Diagnosis: Same   Procedure:   Transcatheter Aortic Valve Replacement - Percutaneous left Transfemoral Approach  Edwards Sapien 3 Ultra THV (size 20 mm, model # [id number],)   Co-Surgeons:  Eugenio Hoes MD and  Verne Carrow    Anesthesiologist:  Corky Sox MD  Echocardiographer:  Dr Royann Shivers  Pre-operative Echo Findings: Severe aortic stenosis normal left ventricular systolic function  Post-operative Echo Findings: trivial paravalvular leak normal left ventricular systolic function   BRIEF CLINICAL NOTE AND INDICATIONS FOR SURGERY  89 yo female with NYHA class 2 symptoms of severe AS with normal LV function and no CAD. Pt does have low LM but appears to not be in jeopardy due to large sinus and small leaflet. Her sizing with her age and calcified root to be 20mm Sapien with possibly added volume     DETAILS OF THE OPERATIVE PROCEDURE  PREPARATION:    The patient was brought to the operating room on the above mentioned date and appropriate monitoring was established by the anesthesia team. The patient was placed in the supine position on the operating table.  Intravenous antibiotics were administered. The patient was monitored closely throughout the procedure under conscious sedation.  Baseline transthoracic echocardiogram was performed. The patient's abdomen and both groins were prepped and draped in a sterile manner. A time out procedure was performed.   PERIPHERAL ACCESS:    Using the modified Seldinger technique, femoral arterial and venous access was obtained with placement of 6 Fr sheaths on the right side.  A pigtail diagnostic catheter was passed through the right arterial sheath under fluoroscopic guidance into the aortic root.  A temporary transvenous pacemaker catheter was  passed through the right femoral venous sheath under fluoroscopic guidance into the right ventricle.  The pacemaker was tested to ensure stable lead placement and pacemaker capture. Aortic root angiography was performed in order to determine the optimal angiographic angle for valve deployment.   TRANSFEMORAL ACCESS:   Percutaneous transfemoral access and sheath placement was performed using ultrasound guidance.  The left common femoral artery was cannulated using a micropuncture needle and appropriate location was verified using hand injection angiogram.  A pair of Abbott Perclose percutaneous closure devices were placed and a 6 French sheath replaced into the femoral artery.  The patient was heparinized systemically and ACT verified > 250 seconds.    A 14 Fr transfemoral E-sheath was introduced into the left common femoral artery after progressively dilating over an Amplatz superstiff wire. An AL2 catheter was used to direct a straight-tip exchange length wire across the native aortic valve into the left ventricle. This was exchanged out for a pigtail catheter and position was confirmed in the LV apex. Simultaneous LV and Ao pressures were recorded.LVEDP was  The pigtail catheter was exchanged for a Safari wire in the LV apex.   BALLOON AORTIC VALVULOPLASTY:   Balloon aortic valvuloplasty was performed using a 18 mm valvuloplasty balloon.  Once optimal position was achieved, BAV was done under rapid ventricular pacing. The patient recovered well hemodynamically.    TRANSCATHETER HEART VALVE DEPLOYMENT:   An Edwards Sapien 3 Ultra transcatheter heart valve (size 20 mm plus 1cc) was prepared and crimped per manufacturer's guidelines, and the proper orientation of the valve is confirmed on the [employer] delivery system. The valve was advanced through the  introducer sheath using normal technique until in an appropriate position in the abdominal aorta beyond the sheath tip. The balloon was  then retracted and using the fine-tuning wheel was centered on the valve. The valve was then advanced across the aortic arch using appropriate flexion of the catheter. The valve was carefully positioned across the aortic valve annulus. The Commander catheter was retracted using normal technique. Once final position of the valve has been confirmed by angiographic assessment, the valve is deployed during rapid ventricular pacing to maintain systolic blood pressure < 50 mmHg and pulse pressure < 10 mmHg. The balloon inflation is held for >3 seconds after reaching full deployment volume. Once the balloon has fully deflated the balloon is retracted into the ascending aorta and valve function is assessed using echocardiography. There is felt to be trivial paravalvular leak and no central aortic insufficiency.  The patient's hemodynamic recovery following valve deployment is good.  The deployment balloon and guidewire are both removed.    PROCEDURE COMPLETION:   The sheath was removed and femoral artery closure performed.  Protamine was administered once femoral arterial repair was complete. The temporary pacemaker, pigtail catheter and femoral sheaths were removed with manual pressure used for venous hemostasis.  A Mynx femoral closure device was utilized following removal of the diagnostic sheath in the right femoral artery.  The patient tolerated the procedure well and is transported to the cath lab recovery area in stable condition. There were no immediate intraoperative complications. All sponge instrument and needle counts are verified correct at completion of the operation.   No blood products were administered during the operation.  The patient received a total of 30 mL of intravenous contrast during the procedure.   Eugenio Hoes, MD 10/30/2023 9:18 AM

## 2023-10-30 NOTE — Anesthesia Preprocedure Evaluation (Signed)
 Anesthesia Evaluation  Patient identified by MRN, date of birth, ID band Patient awake    Reviewed: Allergy & Precautions, NPO status , Patient's Chart, lab work & pertinent test results  History of Anesthesia Complications Negative for: history of anesthetic complications  Airway Mallampati: IV  TM Distance: >3 FB Neck ROM: Full  Mouth opening: Limited Mouth Opening  Dental  (+) Teeth Intact, Dental Advisory Given   Pulmonary shortness of breath, neg sleep apnea, neg COPD, neg recent URI   breath sounds clear to auscultation       Cardiovascular hypertension, Pt. on medications and Pt. on home beta blockers (-) angina (-) Past MI + Valvular Problems/Murmurs AS  Rhythm:Regular + Systolic murmurs  1. Left ventricular ejection fraction, by estimation, is 60 to 65%. The  left ventricle has normal function. The left ventricle has no regional  wall motion abnormalities. There is moderate asymmetric left ventricular  hypertrophy of the basal-septal  segment. Left ventricular diastolic parameters are consistent with Grade  II diastolic dysfunction (pseudonormalization). Elevated left atrial  pressure.   2. Right ventricular systolic function is normal. The right ventricular  size is normal. Tricuspid regurgitation signal is inadequate for assessing  PA pressure.   3. Left atrial size was moderately dilated.   4. Right atrial size was moderately dilated.   5. The mitral valve is normal in structure. Trivial mitral valve  regurgitation. No evidence of mitral stenosis.   6. The tricuspid valve is abnormal.   7. Severe aortic stenosis. Highest mean gradient is measured with Pedhoff  probe at 62 mmHg. Mean gradient by 2D probe likely underestimated based on  angle of acquisition. . The aortic valve is tricuspid. There is moderate  calcification of the aortic  valve. There is moderate thickening of the aortic valve. Aortic valve   regurgitation is not visualized. Severe aortic valve stenosis. Aortic  valve mean gradient measures 54.0 mmHg. Aortic valve peak gradient  measures 92.4 mmHg. Aortic valve area, by VTI  measures 0.94 cm.   8. The inferior vena cava is normal in size with greater than 50%  respiratory variability, suggesting right atrial pressure of 3 mmHg.     Neuro/Psych neg Seizures  negative psych ROS   GI/Hepatic Neg liver ROS, hiatal hernia,GERD  ,,  Endo/Other  negative endocrine ROS    Renal/GU negative Renal ROSLab Results      Component                Value               Date                      NA                       139                 10/26/2023                K                        3.8                 10/26/2023                CO2                      30  10/26/2023                GLUCOSE                  97                  10/26/2023                BUN                      18                  10/26/2023                CREATININE               0.56                10/26/2023                CALCIUM                  10.1                10/26/2023                GFR                      103.47              08/03/2011                EGFR                     88                  10/17/2023                GFRNONAA                 >60                 10/26/2023                Musculoskeletal  (+) Arthritis ,    Abdominal   Peds  Hematology negative hematology ROS (+) Lab Results      Component                Value               Date                      WBC                      5.3                 10/26/2023                HGB                      12.5                10/26/2023                HCT                      37.6  10/26/2023                MCV                      94.0                10/26/2023                PLT                      172                 10/26/2023              Anesthesia Other Findings   Reproductive/Obstetrics                              Anesthesia Physical Anesthesia Plan  ASA: 4  Anesthesia Plan: MAC   Post-op Pain Management: Minimal or no pain anticipated   Induction: Intravenous  PONV Risk Score and Plan: 2 and Ondansetron  Airway Management Planned: Nasal Cannula, Natural Airway and Simple Face Mask  Additional Equipment: Arterial line  Intra-op Plan:   Post-operative Plan:   Informed Consent: I have reviewed the patients History and Physical, chart, labs and discussed the procedure including the risks, benefits and alternatives for the proposed anesthesia with the patient or authorized representative who has indicated his/her understanding and acceptance.     Dental advisory given  Plan Discussed with: CRNA  Anesthesia Plan Comments:         Anesthesia Quick Evaluation

## 2023-10-30 NOTE — Progress Notes (Signed)
  [facility]ASCULAR CENTER   MULTIDISCIPLINARY HEART VALVE TEAM  Patient doing well s/p TAVR. She is hemodynamically stable. Groin sites stable. ECG with sinus and no high grade block. Transferred from cath lab holding to 4E. Will give one dose of IV Lasix 20mg  and Kdur given elevated LVEDP ~20. Early ambulation after bedrest completed and hopeful discharge over the next 24-48 hours.   Cline Crock PA-C  [facility]  Pager [phone]

## 2023-10-30 NOTE — Discharge Instructions (Signed)

## 2023-10-30 NOTE — CV Procedure (Signed)
 HEART AND VASCULAR CENTER  TAVR OPERATIVE NOTE   Date of Procedure:  10/30/2023  Preoperative Diagnosis: Severe Aortic Stenosis   Postoperative Diagnosis: Same   Procedure:   Transcatheter Aortic Valve Replacement - Transfemoral Approach  Edwards Sapien 3 THV (size 20 mm, model # [id number], serial # [id number] )   Co-Surgeons:  Verne Carrow, MD and Eugenio Hoes , MD   Anesthesiologist:  Maple Hudson  Echocardiographer:  Croitoru  Pre-operative Echo Findings: Severe aortic stenosis Normal left ventricular systolic function  Post-operative Echo Findings: Trivial paravalvular leak Normal left ventricular systolic function  BRIEF CLINICAL NOTE AND INDICATIONS FOR SURGERY  89 yo female with history of HTN, carotid artery disease, borderline DM, hyperlipidemia, GERD and severe aortic stenosis. Echo 07/31/23 with LVEF=60-65%. Normal RV function. Trivial MR. Severe aortic stenosis with mean gradient 54 mmHg, AVA 0.88 cm2. Cardiac cath with mild CAD. She has progressive dyspnea with exertion.   During the course of the patient's preoperative work up they have been evaluated comprehensively by a multidisciplinary team of specialists coordinated through the Multidisciplinary Heart Valve Clinic in the [facility].  They have been demonstrated to suffer from symptomatic severe aortic stenosis as noted above. The patient has been counseled extensively as to the relative risks and benefits of all options for the treatment of severe aortic stenosis including long term medical therapy, conventional surgery for aortic valve replacement, and transcatheter aortic valve replacement.  The patient has been independently evaluated by Dr. Leafy Ro with CT surgery and they are felt to be at high risk for conventional surgical aortic valve replacement. The surgeon indicated the patient would be a poor candidate for conventional surgery. Based upon review of all of the patient's  preoperative diagnostic tests they are felt to be candidate for transcatheter aortic valve replacement using the transfemoral approach as an alternative to high risk conventional surgery.    Following the decision to proceed with transcatheter aortic valve replacement, a discussion has been held regarding what types of management strategies would be attempted intraoperatively in the event of life-threatening complications, including whether or not the patient would be considered a candidate for the use of cardiopulmonary bypass and/or conversion to open sternotomy for attempted surgical intervention.  The patient has been advised of a variety of complications that might develop peculiar to this approach including but not limited to risks of death, stroke, paravalvular leak, aortic dissection or other major vascular complications, aortic annulus rupture, device embolization, cardiac rupture or perforation, acute myocardial infarction, arrhythmia, heart block or bradycardia requiring permanent pacemaker placement, congestive heart failure, respiratory failure, renal failure, pneumonia, infection, other late complications related to structural valve deterioration or migration, or other complications that might ultimately cause a temporary or permanent loss of functional independence or other long term morbidity.  The patient provides full informed consent for the procedure as described and all questions were answered preoperatively.    DETAILS OF THE OPERATIVE PROCEDURE  PREPARATION:   The patient is brought to the operating room on the above mentioned date and central monitoring was established by the anesthesia team including placement of a radial arterial line. The patient is placed in the supine position on the operating table.  Intravenous antibiotics are administered. Conscious sedation is used.   Baseline transthoracic echocardiogram was performed. The patient's chest, abdomen, both groins, and both  lower extremities are prepared and draped in a sterile manner. A time out procedure is performed.   PERIPHERAL ACCESS:   Using the modified  Seldinger technique, femoral arterial and venous access were obtained with placement of a 6 Fr sheath in the artery and a 7 Fr sheath in the vein on the right side using u/s guidance.  A pigtail diagnostic catheter was passed through the femoral arterial sheath under fluoroscopic guidance into the aortic root.  A temporary transvenous pacemaker catheter was passed through the femoral venous sheath under fluoroscopic guidance into the right ventricle.  The pacemaker was tested to ensure stable lead placement and pacemaker capture. Aortic root angiography was performed in order to determine the optimal angiographic angle for valve deployment.  TRANSFEMORAL ACCESS:  A micropuncture kit was used to gain access to the left femoral artery using u/s guidance. Position confirmed with angiography. Pre-closure with double ProGlide closure devices. The patient was heparinized systemically and ACT verified > 250 seconds.    A 14 Fr transfemoral E-sheath was introduced into the left femoral artery after progressively dilating over an Amplatz superstiff wire. An AL-1 catheter was used to direct a straight-tip exchange length wire across the native aortic valve into the left ventricle. This was exchanged out for a pigtail catheter and position was confirmed in the LV apex. Simultaneous LV and Ao pressures were recorded.  The pigtail catheter was then exchanged for a Safari wire in the LV apex.   TRANSCATHETER HEART VALVE DEPLOYMENT:  An Edwards Sapien 3 THV (size 20 mm) was prepared and crimped per manufacturer's guidelines, and the proper orientation of the valve is confirmed on the [employer] delivery system. The valve was advanced through the introducer sheath using normal technique until in an appropriate position in the abdominal aorta beyond the sheath tip. The balloon  was then retracted and using the fine-tuning wheel was centered on the valve. The valve was then advanced across the aortic arch using appropriate flexion of the catheter. The valve was carefully positioned across the aortic valve annulus. The Commander catheter was retracted using normal technique. Once final position of the valve has been confirmed by angiographic assessment, the valve is deployed while temporarily holding ventilation and during rapid ventricular pacing to maintain systolic blood pressure < 50 mmHg and pulse pressure < 10 mmHg. The balloon inflation is held for >3 seconds after reaching full deployment volume. Once the balloon has fully deflated the balloon is retracted into the ascending aorta and valve function is assessed using TTE. There is felt to be trivial paravalvular leak and no central aortic insufficiency.  The patient's hemodynamic recovery following valve deployment is good.  The deployment balloon and guidewire are both removed. Echo demostrated acceptable post-procedural gradients, stable mitral valve function, and trivial AI.   PROCEDURE COMPLETION:  The sheath was then removed and closure devices were completed. Protamine was administered once femoral arterial repair was complete. The temporary pacemaker, pigtail catheters and femoral sheaths were removed with  a Mynx closure device placed in the artery and manual pressure used for venous hemostasis.    The patient tolerated the procedure well and is transported to the surgical intensive care in stable condition. There were no immediate intraoperative complications. All sponge instrument and needle counts are verified correct at completion of the operation.   No blood products were administered during the operation.  The patient received a total of 30 mL of intravenous contrast during the procedure.  LVEDP: 20 mmHg  Verne Carrow MD, [facility] 10/30/2023 9:21 AM

## 2023-10-30 NOTE — Transfer of Care (Signed)
 Immediate Anesthesia Transfer of Care Note  Patient: Melissa Carpenter  Procedure(s) Performed: Transcatheter Aortic Valve Replacement, Transfemoral ECHOCARDIOGRAM, TRANSTHORACIC  Patient Location: PACU and Cath Lab  Anesthesia Type:MAC  Level of Consciousness: awake and drowsy  Airway & Oxygen Therapy: Patient Spontanous Breathing and Patient connected to face mask oxygen  Post-op Assessment: Report given to RN and Post -op Vital signs reviewed and stable  Post vital signs: Reviewed and stable  Last Vitals:  Vitals Value Taken Time  BP 102/40 10/30/23 0921  Temp    Pulse 49 10/30/23 0924  Resp 12 10/30/23 0924  SpO2 97 % 10/30/23 0924  Vitals shown include unfiled device data.  Last Pain:  Vitals:   10/30/23 0603  TempSrc:   PainSc: 0-No pain         Complications: There were no known notable events for this encounter.

## 2023-10-31 ENCOUNTER — Inpatient Hospital Stay (HOSPITAL_COMMUNITY)

## 2023-10-31 DIAGNOSIS — Z952 Presence of prosthetic heart valve: Secondary | ICD-10-CM

## 2023-10-31 DIAGNOSIS — I350 Nonrheumatic aortic (valve) stenosis: Secondary | ICD-10-CM

## 2023-10-31 LAB — BASIC METABOLIC PANEL
Anion gap: 6 (ref 5–15)
BUN: 16 mg/dL (ref 8–23)
CO2: 24 mmol/L (ref 22–32)
Calcium: 8.7 mg/dL — ABNORMAL LOW (ref 8.9–10.3)
Chloride: 107 mmol/L (ref 98–111)
Creatinine, Ser: 0.49 mg/dL (ref 0.44–1.00)
GFR, Estimated: >60 mL/min (ref >60)
Glucose, Bld: 124 mg/dL — ABNORMAL HIGH (ref 70–99)
Potassium: 3.1 mmol/L — ABNORMAL LOW (ref 3.5–5.1)
Sodium: 137 mmol/L (ref 135–145)

## 2023-10-31 LAB — CBC
HCT: 31.6 % — ABNORMAL LOW (ref 36.0–46.0)
Hemoglobin: 10.6 g/dL — ABNORMAL LOW (ref 12.0–15.0)
MCH: 30.8 pg (ref 26.0–34.0)
MCHC: 33.5 g/dL (ref 30.0–36.0)
MCV: 91.9 fL (ref 80.0–100.0)
Platelets: 125 10*3/uL — ABNORMAL LOW (ref 150–400)
RBC: 3.44 MIL/uL — ABNORMAL LOW (ref 3.87–5.11)
RDW: 13.4 % (ref 11.5–15.5)
WBC: 7.7 10*3/uL (ref 4.0–10.5)
nRBC: 0 % (ref 0.0–0.2)

## 2023-10-31 LAB — MAGNESIUM: Magnesium: 1.8 mg/dL (ref 1.7–2.4)

## 2023-10-31 MED ORDER — AMLODIPINE-ATORVASTATIN 10-40 MG PO TABS: 1.0000 | ORAL_TABLET | Freq: Every day | ORAL | Status: DC

## 2023-10-31 MED ORDER — POTASSIUM CHLORIDE CRYS ER 20 MEQ PO TBCR
40.0000 meq | EXTENDED_RELEASE_TABLET | Freq: Once | ORAL | Status: AC
  Administered 2023-10-31: 40 meq via ORAL
  Filled 2023-10-31: qty 2

## 2023-10-31 MED ORDER — AMLODIPINE BESYLATE 10 MG PO TABS
10.0000 mg | ORAL_TABLET | Freq: Every day | ORAL | Status: DC
  Administered 2023-10-31: 10 mg via ORAL
  Filled 2023-10-31: qty 1

## 2023-10-31 MED ORDER — IRBESARTAN 150 MG PO TABS
75.0000 mg | ORAL_TABLET | Freq: Every day | ORAL | Status: DC
  Administered 2023-10-31: 75 mg via ORAL
  Filled 2023-10-31: qty 1

## 2023-10-31 MED ORDER — HYDRALAZINE HCL 25 MG PO TABS
25.0000 mg | ORAL_TABLET | Freq: Three times a day (TID) | ORAL | Status: DC
  Administered 2023-10-31: 25 mg via ORAL
  Filled 2023-10-31: qty 1

## 2023-10-31 MED ORDER — ATORVASTATIN CALCIUM 40 MG PO TABS
40.0000 mg | ORAL_TABLET | Freq: Every day | ORAL | Status: DC
  Administered 2023-10-31: 40 mg via ORAL

## 2023-10-31 NOTE — Progress Notes (Signed)
 Mobility Specialist Progress Note:    10/31/23 1150  Mobility  Activity Ambulated with assistance in hallway;Ambulated with assistance in room  Level of Assistance Contact guard assist, steadying assist  Assistive Device None  Distance Ambulated (ft) 240 ft  Activity Response Tolerated well  Mobility Referral Yes  Mobility visit 1 Mobility  Mobility Specialist Start Time (ACUTE ONLY) 1140  Mobility Specialist Stop Time (ACUTE ONLY) 1150  Mobility Specialist Time Calculation (min) (ACUTE ONLY) 10 min   Pt received in bed, agreeable to mobility session. Family in room. Ambulated in hallway, no AD, CGA for safety. Pt was slightly limping d/t soreness in R toe nail. Max HR 97 bpm. Slow gait throughout. Returned pt to room, all needs met. Eager for d/c.    Feliciana Rossetti Mobility Specialist Please contact via SecureChat or  Rehab office at [phone]

## 2023-10-31 NOTE — Progress Notes (Signed)
   10/31/23 1341  TOC Brief Assessment  Insurance and Status Reviewed  Patient has primary care physician Yes  Home environment has been reviewed home w/ spouse  Prior level of function: self  Prior/Current Home Services No current home services  Social Drivers of Health Review SDOH reviewed no interventions necessary  Readmission risk has been reviewed Yes  Transition of care needs no transition of care needs at this time    Pt s/p TAVR, stable for transition home today, no HH or DME needs noted. Family to transport home

## 2023-10-31 NOTE — Progress Notes (Signed)
 Pt's son and husband just returned to unit, sent them to go get car and pt will be brought out in a wheelchair.   Aneliese Beaudry,RN SWOT

## 2023-10-31 NOTE — Progress Notes (Signed)
  Echocardiogram 2D Echocardiogram has been performed.  Melissa Carpenter 10/31/2023, 11:07 AM

## 2023-10-31 NOTE — Plan of Care (Signed)
  Problem: Activity: Goal: Risk for activity intolerance will decrease Outcome: Progressing   Problem: Nutrition: Goal: Adequate nutrition will be maintained Outcome: Progressing   Problem: Coping: Goal: Level of anxiety will decrease Outcome: Progressing   Problem: Elimination: Goal: Will not experience complications related to bowel motility Outcome: Progressing   Problem: Elimination: Goal: Will not experience complications related to urinary retention Outcome: Progressing   Problem: Pain Managment: Goal: General experience of comfort will improve and/or be controlled Outcome: Progressing   Problem: Safety: Goal: Ability to remain free from injury will improve Outcome: Progressing

## 2023-10-31 NOTE — Progress Notes (Signed)
 CARDIAC REHAB PHASE I     Post TAVR education including site care, restrictions, risk factors, heart healthy diet, exercise guidelines and CRP2 reviewed. All questions and concerns addressed. Will refer to AP for CRP2. Plan for discharge later today.   [id number] Woodroe Chen, RN BSN 10/31/2023 9:37 AM

## 2023-10-31 NOTE — Progress Notes (Signed)
 Discharge instructions reviewed with pt.  Copy of instructions given to pt. No new scripts, no changes in medications.  Assisted pt with getting dressed and ambulated with assist to the bathroom, pt's husband and son had gone downstairs to get something to eat prior to discharge nurse entering room. Family should be returning shortly.  Pt will be d/c'd via wheelchair with belongings, with her family and will be           escorted by staff/hospital volunteer.   Teeghan Hammer,RN SWOT

## 2023-11-01 ENCOUNTER — Telehealth: Payer: Self-pay | Admitting: Physician Assistant

## 2023-11-01 LAB — ECHOCARDIOGRAM COMPLETE
AR max vel: 2.2 cm2
AV Area VTI: 2.34 cm2
AV Area mean vel: 2.22 cm2
AV Mean grad: 22.5 mmHg
AV Peak grad: 37 mmHg
Ao pk vel: 3.04 m/s
Area-P 1/2: 3.12 cm2
Calc EF: 70.6 %
Height: 60 in
MV VTI: 3.39 cm2
S' Lateral: 2.2 cm
Single Plane A2C EF: 70.2 %
Single Plane A4C EF: 75.5 %
Weight: 2059.2 [oz_av]

## 2023-11-01 NOTE — Telephone Encounter (Signed)
  HEART AND VASCULAR CENTER   MULTIDISCIPLINARY HEART VALVE TEAM   Patient contacted regarding discharge from [facility] on 10/31/23.  Patient understands to follow up with a Dr. Clifton James 3/27 at [address]  Patient understands discharge instructions? yes Patient understands medications and regimen? yes Patient understands to bring all medications to this visit? yes  Cline Crock PA-C  [facility]

## 2023-11-01 NOTE — Anesthesia Postprocedure Evaluation (Signed)
 Anesthesia Post Note  Patient: Melissa Carpenter  Procedure(s) Performed: Transcatheter Aortic Valve Replacement, Transfemoral ECHOCARDIOGRAM, TRANSTHORACIC     Patient location during evaluation: Cath Lab Anesthesia Type: MAC Level of consciousness: awake and alert Pain management: pain level controlled Vital Signs Assessment: post-procedure vital signs reviewed and stable Respiratory status: spontaneous breathing, nonlabored ventilation and respiratory function stable Cardiovascular status: stable and blood pressure returned to baseline Postop Assessment: no apparent nausea or vomiting Anesthetic complications: no   There were no known notable events for this encounter.                  Fenix Rorke

## 2023-11-08 ENCOUNTER — Ambulatory Visit (INDEPENDENT_AMBULATORY_CARE_PROVIDER_SITE_OTHER)

## 2023-11-08 ENCOUNTER — Encounter: Payer: Self-pay | Admitting: Cardiovascular Disease

## 2023-11-08 ENCOUNTER — Ambulatory Visit: Attending: Cardiovascular Disease | Admitting: Cardiovascular Disease

## 2023-11-08 VITALS — BP 168/62 | HR 64 | Ht 60.0 in | Wt 129.2 lb

## 2023-11-08 DIAGNOSIS — R002 Palpitations: Secondary | ICD-10-CM

## 2023-11-08 DIAGNOSIS — Z952 Presence of prosthetic heart valve: Secondary | ICD-10-CM

## 2023-11-08 DIAGNOSIS — I5032 Chronic diastolic (congestive) heart failure: Secondary | ICD-10-CM | POA: Diagnosis not present

## 2023-11-08 DIAGNOSIS — I350 Nonrheumatic aortic (valve) stenosis: Secondary | ICD-10-CM

## 2023-11-08 MED ORDER — FUROSEMIDE 20 MG PO TABS: 20.0000 mg | ORAL_TABLET | Freq: Every day | ORAL | 3 refills | Status: DC | PRN

## 2023-11-08 NOTE — Patient Instructions (Signed)
 Medication Instructions:  Your physician has recommended you make the following change in your medication:  1.) furosemide 20 mg - take one tablet daily for 3 days and then take one tablet daily as needed  *If you need a refill on your cardiac medications before your next appointment, please call your pharmacy*   Lab Work: none If you have labs (blood work) drawn today and your tests are completely normal, you will receive your results only by: MyChart Message (if you have MyChart) OR A paper copy in the mail If you have any lab test that is abnormal or we need to change your treatment, we will call you to review the results.   Testing/Procedures: 7 Day Zio Heart Monitor - see instructions below   Follow-Up: As planned

## 2023-11-08 NOTE — Progress Notes (Unsigned)
 ZIO XT serial # [id number] from office inventory applied to patient.

## 2023-11-08 NOTE — Progress Notes (Signed)
 Structural Heart Clinic Note  Chief Complaint  Patient presents with   Follow-up    S/p TAVR   History of Present Illness: 89 yo female with history of HTN, carotid artery disease, borderline DM, hyperlipidemia, GERD and severe aortic stenosis now s/p TAVR who is here today for one week post TAVR follow up. She is followed in our office by Dr. Antoine Poche. Echo 07/31/23 with LVEF=60-65%. Normal RV function. Trivial MR. Severe aortic stenosis with mean gradient 54 mmHg, AVA 0.88 cm2. Cardiac cath 09/05/23 with mild CAD. She underwent TAVR on 10/30/23 with placement of a 20 mm Edwards Sapien 3 Ultra Resilia Ultra THV from the transfemoral approach. She did well with her TAVR. She was found to have elevated LV pressures during TAVR and was given one dose of IV Lasix. Echo 10/31/23 with normal LV function and normally functioning AVR with trivial PVL. Moderate mitral regurgitation. She was discharged on ASA 81 mg daily.   She is here today for follow up. The patient denies any chest pain. She is having some dizziness with associated palpitations. She also has some LE edema that resolves at night as well as dyspnea on exertion. No orthopnea, PND.   Primary Care Physician: [name], [name], MD Primary Cardiologist: Hochrein  Past Medical History:  Diagnosis Date   Carotid artery disease [facility])    Cataract    Chest pain    Catheterization, 2003, no significant CAD   DJD (degenerative joint disease)    Dyslipidemia    GERD (gastroesophageal reflux disease)    Hyperlipidemia    Hypertension    Mitral valve prolapse    Osteoporosis    Prediabetes    S/P TAVR (transcatheter aortic valve replacement) 10/30/2023   s/p TAVR with a 20 mm Edwards S3UR via the TF approach by Dr. Clifton James & Bartle   Severe aortic stenosis    Thyroid nodule     Past Surgical History:  Procedure Laterality Date   ABDOMINAL HYSTERECTOMY  1964   APPENDECTOMY  1946   BIOPSY THYROID Left    CARDIAC CATHETERIZATION   04/2002   no significant cad   CATARACT EXTRACTION, BILATERAL     COLONOSCOPY     INTRAOPERATIVE TRANSTHORACIC ECHOCARDIOGRAM N/A 10/30/2023   Procedure: ECHOCARDIOGRAM, TRANSTHORACIC;  Surgeon: [name], MD;  Location: [facility] INVASIVE CV LAB;  Service: Cardiovascular;  Laterality: N/A;   NASAL SINUS SURGERY     RIGHT HEART CATH AND CORONARY ANGIOGRAPHY N/A 09/05/2023   Procedure: RIGHT HEART CATH AND CORONARY ANGIOGRAPHY;  Surgeon: [name], MD;  Location: [facility] INVASIVE CV LAB;  Service: Cardiovascular;  Laterality: N/A;   TONSILLECTOMY      Current Outpatient Medications  Medication Sig Dispense Refill   Accu-Chek Softclix Lancets lancets Use to check blood sugars daily 100 each 3   acetaminophen (TYLENOL) 500 MG tablet Take 500 mg by mouth every 6 (six) hours as needed for moderate pain (pain score 4-6).     albuterol (VENTOLIN HFA) 108 (90 Base) MCG/ACT inhaler Inhale 2 puffs into the lungs every 6 (six) hours as needed for wheezing or shortness of breath. 8 g 2   amLODipine-atorvastatin (CADUET) 10-40 MG tablet Take 1 tablet by mouth daily. 90 tablet 3   aspirin 81 MG tablet Take 81 mg by mouth daily.     Calcium Carbonate-Vitamin D (CALTRATE 600+D) 600-400 MG-UNIT per tablet Take 1 tablet by mouth daily.     cholecalciferol (VITAMIN D3) 25 MCG (1000 UNIT) tablet Take 1,000  Units by mouth daily.     fish oil-omega-3 fatty acids 1000 MG capsule Take 1 g by mouth daily.     fluticasone (FLONASE) 50 MCG/ACT nasal spray SPRAY 2 SPRAYS INTO EACH NOSTRIL EVERY DAY 48 mL 1   furosemide (LASIX) 20 MG tablet Take 1 tablet (20 mg total) by mouth daily as needed. 30 tablet 3   hydrALAZINE (APRESOLINE) 25 MG tablet TAKE 25 MG MORNING, 25 MG AT LUNCH, AND 50 MG ( 2 TABLETS ) AT BEDTIME 360 tablet 3   Lidocaine HCl (ASPERCREME LIDOCAINE) 4 % CREA Apply 1 Application topically at bedtime.     metoprolol succinate (TOPROL-XL) 100 MG 24 hr tablet Take 1 tablet (100 mg total) by  mouth daily. TAKE WITH OR IMMEDIATELY FOLLOWING A MEAL. 90 tablet 3   Multiple Vitamin (MULTIVITAMIN) tablet Take 1 tablet by mouth daily.     polyethylene glycol powder (GLYCOLAX/MIRALAX) 17 GM/SCOOP powder Take 17 g by mouth daily.     telmisartan (MICARDIS) 80 MG tablet Take 1 tablet (80 mg total) by mouth every evening. 90 tablet 3   triamcinolone cream (KENALOG) 0.1 % APPLY TO AFFECTED AREA TWICE A DAY (Patient taking differently: Apply 1 Application topically 2 (two) times daily as needed (rash).) 90 g 1   No current facility-administered medications for this visit.    Allergies  Allergen Reactions   Codeine Hives   Forteo [Parathyroid Hormone (Recomb)] Other (See Comments)    Weakness and calcium increase   Raloxifene Other (See Comments)    Eye problems   Morphine Rash   Penicillins Rash   Sulfonamide Derivatives Rash    Social History   Socioeconomic History   Marital status: Married    Spouse name: Dayton Scrape Child psychotherapist)   Number of children: 4   Years of education: Not on file   Highest education level: Not on file  Occupational History   Occupation: retired    Associate Professor: [employer]  Tobacco Use   Smoking status: Never   Smokeless tobacco: Never  Vaping Use   Vaping status: Never Used  Substance and Sexual Activity   Alcohol use: No    Alcohol/week: 0.0 standard drinks of alcohol   Drug use: No   Sexual activity: Never  Other Topics Concern   Not on file  Social History Narrative   Lives in 2 story home with her husband   Children live nearby   Social Drivers of Health   Financial Resource Strain: Low Risk  (05/14/2023)   Overall Financial Resource Strain (CARDIA)    Difficulty of Paying Living Expenses: Not hard at all  Food Insecurity: No Food Insecurity (10/30/2023)   Hunger Vital Sign    Worried About Running Out of Food in the Last Year: Never true    Ran Out of Food in the Last Year: Never true  Transportation Needs: No Transportation Needs  (10/30/2023)   PRAPARE - Administrator, Civil Service (Medical): No    Lack of Transportation (Non-Medical): No  Physical Activity: Inactive (05/14/2023)   Exercise Vital Sign    Days of Exercise per Week: 0 days    Minutes of Exercise per Session: 0 min  Stress: No Stress Concern Present (05/14/2023)   [employer] of Occupational Health - Occupational Stress Questionnaire    Feeling of Stress : Not at all  Social Connections: Moderately Integrated (10/31/2023)   Social Connection and Isolation Panel [NHANES]    Frequency of Communication with Friends and Family:  More than three times a week    Frequency of Social Gatherings with Friends and Family: Three times a week    Attends Religious Services: More than 4 times per year    Active Member of Clubs or Organizations: No    Attends Banker Meetings: Never    Marital Status: Married  Catering manager Violence: Not At Risk (10/30/2023)   Humiliation, Afraid, Rape, and Kick questionnaire    Fear of Current or Ex-Partner: No    Emotionally Abused: No    Physically Abused: No    Sexually Abused: No    Family History  Problem Relation Age of Onset   Heart attack Mother    Hypertension Mother    Throat cancer Mother        THROAT / VOCAL CORD   Heart disease Mother    Hypertension Father    Heart disease Father    Kidney disease Father        failure   Bone cancer Sister    Stroke Sister    Bone cancer Grandchild        in rib cage   Hypertension Sister    Metabolic syndrome Sister        pre diabetes   GER disease Sister    Heart disease Sister        CAD   Heart attack Brother    Cirrhosis Brother    Breast cancer Sister    Bone cancer Sister    Hyperlipidemia Sister    Heart attack Sister    Stroke Son    Colitis Neg Hx    Esophageal cancer Neg Hx    Stomach cancer Neg Hx    Rectal cancer Neg Hx     Review of Systems:  As stated in the HPI and otherwise negative.   BP (!) 168/62    Pulse 64   Ht 5' (1.524 m)   Wt 58.6 kg   SpO2 96%   BMI 25.23 kg/m   Physical Examination: General: Well developed, well nourished, NAD  HEENT: OP clear, mucus membranes moist  SKIN: warm, dry. No rashes. Neuro: No focal deficits  Musculoskeletal: Muscle strength 5/5 all ext  Psychiatric: Mood and affect normal  Neck: No JVD, no carotid bruits, no thyromegaly, no lymphadenopathy.  Lungs:Clear bilaterally, no wheezes, rhonci, crackles Cardiovascular: Regular rate and rhythm. Systolic murmur. Abdomen:Soft. Bowel sounds present. Non-tender.  Extremities: No lower extremity edema.   EKG:  EKG is not ordered today. The ekg ordered today demonstrates   Recent Labs: 10/17/2023: TSH 1.270 10/26/2023: ALT 32 10/31/2023: BUN 16; Creatinine, Ser 0.49; Hemoglobin 10.6; Magnesium 1.8; Platelets 125; Potassium 3.1; Sodium 137    Wt Readings from Last 3 Encounters:  11/08/23 58.6 kg  10/31/23 58.4 kg  10/22/23 59 kg    Assessment and Plan:   1. Severe Aortic Valve Stenosis s/p TAVR: She is here today for her one week post TAVR follow up. She is doing well. NYHA class 2. Groins stable. She has mild dyspnea on exertion with LE edema that resolves at night. Also with "skipped beats" and occasional dizziness.  -Will arrange 7 day Zio cardiac monitor -Will start Lasix 20 mg as needed (She will take daily for the next three days).  Will plan to continue ASA 81 mg daily. Will repeat echo one month post TAVR.   2. Chronic diastolic CHF: See above  3. Palpitations: Will arrange cardiac monitor    Labs/ tests ordered today include:  Orders Placed This Encounter  Procedures   LONG TERM MONITOR (3-14 DAYS)   Disposition:   F/U will be arranged with the structural team 3-4 week from now with echo that day  Signed, Verne Carrow, MD, [facility] 11/08/2023 12:20 PM    [facility] [address] [place], [place], [place]  [address] Phone: [phone]; Fax: [phone]

## 2023-11-22 DIAGNOSIS — R002 Palpitations: Secondary | ICD-10-CM | POA: Diagnosis not present

## 2023-11-23 DIAGNOSIS — R002 Palpitations: Secondary | ICD-10-CM

## 2023-11-26 ENCOUNTER — Encounter (HOSPITAL_COMMUNITY): Payer: Self-pay | Admitting: Cardiovascular Disease

## 2023-12-03 ENCOUNTER — Ambulatory Visit (INDEPENDENT_AMBULATORY_CARE_PROVIDER_SITE_OTHER): Admitting: Physician Assistant

## 2023-12-03 ENCOUNTER — Ambulatory Visit: Attending: Cardiovascular Disease

## 2023-12-03 VITALS — BP 138/58 | HR 67 | Resp 16 | Ht 60.0 in | Wt 129.6 lb

## 2023-12-03 DIAGNOSIS — E785 Hyperlipidemia, unspecified: Secondary | ICD-10-CM | POA: Diagnosis not present

## 2023-12-03 DIAGNOSIS — Z952 Presence of prosthetic heart valve: Secondary | ICD-10-CM

## 2023-12-03 DIAGNOSIS — K769 Liver disease, unspecified: Secondary | ICD-10-CM | POA: Diagnosis not present

## 2023-12-03 DIAGNOSIS — N3289 Other specified disorders of bladder: Secondary | ICD-10-CM | POA: Insufficient documentation

## 2023-12-03 DIAGNOSIS — I10 Essential (primary) hypertension: Secondary | ICD-10-CM | POA: Diagnosis not present

## 2023-12-03 DIAGNOSIS — I5032 Chronic diastolic (congestive) heart failure: Secondary | ICD-10-CM | POA: Insufficient documentation

## 2023-12-03 LAB — ECHOCARDIOGRAM COMPLETE
AR max vel: 1.2 cm2
AV Area VTI: 1.12 cm2
AV Area mean vel: 1.16 cm2
AV Mean grad: 22 mmHg
AV Peak grad: 39.9 mmHg
Ao pk vel: 3.16 m/s
Calc EF: 66.7 %
S' Lateral: 2.26 cm
Single Plane A2C EF: 65.7 %
Single Plane A4C EF: 69.1 %

## 2023-12-03 MED ORDER — AZITHROMYCIN 500 MG PO TABS: 500.0000 mg | ORAL_TABLET | ORAL | 12 refills | Status: DC

## 2023-12-03 NOTE — Progress Notes (Signed)
 HEART AND VASCULAR CENTER   MULTIDISCIPLINARY HEART VALVE CLINIC                                     Cardiology Office Note:    Date:  12/03/2023   ID:  Melissa Carpenter, DOB [birth year], MRN [id number]  PCP:  [name], Lucio Sabin, MD  [facility] Cardiologist:  Eilleen Grates, MD  [facility] Structural heart: Antoinette Batman, MD [facility] Electrophysiologist:  None   Referring MD: [name], Lucio Sabin, MD   CC: 1 month s/p TAVR  History of Present Illness:    Melissa Carpenter is a 89 y.o. female with a hx of HTN, carotid artery disease, borderline DM, HLD, GERD and severe aortic stenosis s/p TAVR (10/30/23) who presents to clinic for follow up.   She has been followed by Dr. Lavonne Prairie for moderate aortic stenosis. She reported progressive dyspnea on exertion with mild chores around the house. Echo 07/31/23 showed EF 60% and severe AS with a mean grad 54 mmHg, AVA 0.94 cm2. [facility] 09/05/23 showed mild non-obstructive CAD and normal right heart pressures (RA 5, RV 35/1/6, PA 35/7 mean 17, PCWP 15). She underwent TAVR on 10/30/23 with placement of a 20 mm Edwards Sapien 3 Ultra Resilia Ultra THV from the transfemoral approach. She did well with her TAVR. She was found to have elevated LV pressures during TAVR and was given one dose of IV Lasix . Echo 10/31/23 with normal LV function and normally functioning AVR with trivial PVL and moderate mitral regurgitation. She was discharged on ASA 81 mg daily.   She was seen by Dr. Abel Hoe for post hospital follow up. She was having some dizziness and palpitations as well as worsening DOE and LE edema. She was started on Lasix  20mg  daily x 3 days followed by PRN and a cardiac monitor was placed. This showed no significant arrhythmias.   Today the patient presents to clinic for follow up. Mostly limited by hip and back. Most activity she gets is her house work. She gets it done slowly but surely. Not having to take Lasix  frequently. No CP or SOB. LE edema  has resolved. No orthopnea or PND. No dizziness or syncope. No blood in stool or urine. Occasional palpations but much improved from previous. Husband has dementia and she wants to stay alive long enough to take care of him.    Past Medical History:  Diagnosis Date   Carotid artery disease (HCC)    Cataract    Chest pain    Catheterization, 2003, no significant CAD   DJD (degenerative joint disease)    Dyslipidemia    GERD (gastroesophageal reflux disease)    Hyperlipidemia    Hypertension    Mitral valve prolapse    Osteoporosis    Prediabetes    S/P TAVR (transcatheter aortic valve replacement) 10/30/2023   s/p TAVR with a 20 mm Edwards S3UR via the TF approach by Dr. Abel Hoe & Bartle   Severe aortic stenosis    Thyroid  nodule      Current Medications: Current Meds  Medication Sig   Accu-Chek Softclix Lancets lancets Use to check blood sugars daily   acetaminophen  (TYLENOL ) 500 MG tablet Take 500 mg by mouth every 6 (six) hours as needed for moderate pain (pain score 4-6).   albuterol  (VENTOLIN  HFA) 108 (90 Base) MCG/ACT inhaler Inhale 2 puffs into the lungs every 6 (six) hours as needed for  wheezing or shortness of breath.   amLODipine -atorvastatin  (CADUET ) 10-40 MG tablet Take 1 tablet by mouth daily.   aspirin  81 MG tablet Take 81 mg by mouth daily.   azithromycin  (ZITHROMAX ) 500 MG tablet Take 1 tablet (500 mg total) by mouth as directed. Take one tablet 1 hour before any dental work including cleanings.   Calcium  Carbonate-Vitamin D  (CALTRATE 600+D) 600-400 MG-UNIT per tablet Take 1 tablet by mouth daily.   cholecalciferol (VITAMIN D3) 25 MCG (1000 UNIT) tablet Take 1,000 Units by mouth daily.   fish oil-omega-3 fatty acids 1000 MG capsule Take 1 g by mouth daily.   fluticasone  (FLONASE ) 50 MCG/ACT nasal spray SPRAY 2 SPRAYS INTO EACH NOSTRIL EVERY DAY   furosemide  (LASIX ) 20 MG tablet Take 1 tablet (20 mg total) by mouth daily as needed.   hydrALAZINE  (APRESOLINE ) 25 MG  tablet TAKE 25 MG MORNING, 25 MG AT LUNCH, AND 50 MG ( 2 TABLETS ) AT BEDTIME   Lidocaine  HCl (ASPERCREME LIDOCAINE ) 4 % CREA Apply 1 Application topically at bedtime.   metoprolol  succinate (TOPROL -XL) 100 MG 24 hr tablet Take 1 tablet (100 mg total) by mouth daily. TAKE WITH OR IMMEDIATELY FOLLOWING A MEAL.   Multiple Vitamin (MULTIVITAMIN) tablet Take 1 tablet by mouth daily.   polyethylene glycol powder (GLYCOLAX/MIRALAX) 17 GM/SCOOP powder Take 17 g by mouth daily.   telmisartan  (MICARDIS ) 80 MG tablet Take 1 tablet (80 mg total) by mouth every evening.   triamcinolone  cream (KENALOG ) 0.1 % APPLY TO AFFECTED AREA TWICE A DAY (Patient taking differently: Apply 1 Application topically 2 (two) times daily as needed (rash).)      ROS:   Please see the history of present illness.    All other systems reviewed and are negative.  EKGs       Risk Assessment/Calculations:           Physical Exam:    VS:  BP (!) 138/58 (BP Location: Left Arm, Patient Position: Sitting, Cuff Size: Normal)   Pulse 67   Resp 16   Ht 5' (1.524 m)   Wt 129 lb 9.6 oz (58.8 kg)   SpO2 97%   BMI 25.31 kg/m     Wt Readings from Last 3 Encounters:  12/03/23 129 lb 9.6 oz (58.8 kg)  11/08/23 129 lb 3.2 oz (58.6 kg)  10/31/23 128 lb 11.2 oz (58.4 kg)     GEN: Well nourished, well developed in no acute distress NECK: No JVD CARDIAC: RRR, 3/6 holosystolic murmur loudest at RUSB. No rubs, gallops RESPIRATORY:  Clear to auscultation without rales, wheezing or rhonchi  ABDOMEN: Soft, non-tender, non-distended EXTREMITIES:  No edema; No deformity.    ASSESSMENT:    1. S/P TAVR (transcatheter aortic valve replacement)   2. Essential hypertension   3. Chronic heart failure with preserved ejection fraction (HCC)   4. Dyslipidemia   5. Liver lesion   6. Bladder wall thickening     PLAN:    In order of problems listed above:  Severe AS s/p TAVR:  -- Echo today shows EF 65%, normally functioning  TAVR with a mean gradient of 22 mm hg (related to small valve size) and trivial PVL as well as mild MR.  -- NYHA class I symptoms.  -- Continue Aspirin  81mg  daily.  -- SBE prophylaxis discussed; I have RX'd azithromycin  due to a PCN allergy.  -- I will see back for 1 year office visit with echo.  HTN: -- BP well controlled today.  --  Continue amlodipine  10mg  daily (in [place] ), hydralazine  25mg  TID (50mg  at bedtime), Toprol  XL 100mg  daily, and telmisartan  80mg  daily.    Chronic HFpEF: -- Appears euvoleimc.  -- Continue Lasix  20mg  PRN.   HLD:  -- Continue atorvastatin  40mg  daily (in [place] )   Lesion of liver:  -- Pre TAVR CTs showed a "7 mm enhancing area in the dome of the liver, nonspecific, possibly flash fill hemangioma. This can be further evaluated with MRI."  -- Discussed with pt during office visit today. We do not feel strongly about getting follow up imaging, but I will CC Dr. Steen Eden.    Bladder wall thickening: -- Diffuse bladder wall thickening more prominent anteriorly noted on pre TAVT CTs. Correlate clinically for cystitis. -- UA on 10/26/23 was normal.  -- No further work up required.   Medication Adjustments/Labs and Tests Ordered: Current medicines are reviewed at length with the patient today.  Concerns regarding medicines are outlined above.  No orders of the defined types were placed in this encounter.  Meds ordered this encounter  Medications   azithromycin  (ZITHROMAX ) 500 MG tablet    Sig: Take 1 tablet (500 mg total) by mouth as directed. Take one tablet 1 hour before any dental work including cleanings.    Dispense:  6 tablet    Refill:  12    Supervising Provider:   Arnoldo Lapping [[id number]]    Patient Instructions  Medication Instructions:  No medication changes were made during today's visit.  *If you need a refill on your cardiac medications before your next appointment, please call your pharmacy*   Lab Work: No labs were ordered during today's  visit.  If you have labs (blood work) drawn today and your tests are completely normal, you will receive your results only by: MyChart Message (if you have MyChart) OR A paper copy in the mail If you have any lab test that is abnormal or we need to change your treatment, we will call you to review the results.   Testing/Procedures: No procedures were ordered during today's visit.    Follow-Up: At [facility], you and your health needs are our priority.  As part of our continuing mission to provide you with exceptional heart care, we have created designated Provider Care Teams.  These Care Teams include your primary Cardiologist (physician) and Advanced Practice Providers (APPs -  Physician Assistants and Nurse Practitioners) who all work together to provide you with the care you need, when you need it.  We recommend signing up for the patient portal called "MyChart".  Sign up information is provided on this After Visit Summary.  MyChart is used to connect with patients for Virtual Visits (Telemedicine).  Patients are able to view lab/test results, encounter notes, upcoming appointments, etc.  Non-urgent messages can be sent to your provider as well.   To learn more about what you can do with MyChart, go to [email or web address].    Keep scheduled appointment with Dr. Lavonne Prairie. You will see Jeronimo Moors in 1 year    Other Instructions HEART & VASCULAR CENTER  [address] [place], [place]  [address] OPENING APRIL 28,2025       1st Floor: - Lobby - Registration  - Pharmacy  - Lab - Cafe   2nd Floor: - PV Lab - Diagnostic Testing (echo, CT, nuclear med)   3rd Floor: - Vacant   4th Floor: - TCTS (cardiothoracic surgery) - AFib Clinic - Structural Heart Clinic - Vascular Surgery  - Vascular Ultrasound  5th Floor: - HeartCare Cardiology (general and EP) - Clinical Pharmacy for coumadin, hypertension, lipid, weight-loss medications, and med  management appointments      Valet parking services will be available as well.           Signed, Abagail Hoar, PA-C  12/03/2023 2:58 PM    [facility]

## 2023-12-03 NOTE — Patient Instructions (Addendum)
 Medication Instructions:  No medication changes were made during today's visit.  *If you need a refill on your cardiac medications before your next appointment, please call your pharmacy*   Lab Work: No labs were ordered during today's visit.  If you have labs (blood work) drawn today and your tests are completely normal, you will receive your results only by: MyChart Message (if you have MyChart) OR A paper copy in the mail If you have any lab test that is abnormal or we need to change your treatment, we will call you to review the results.   Testing/Procedures: No procedures were ordered during today's visit.    Follow-Up: At [facility], you and your health needs are our priority.  As part of our continuing mission to provide you with exceptional heart care, we have created designated Provider Care Teams.  These Care Teams include your primary Cardiologist (physician) and Advanced Practice Providers (APPs -  Physician Assistants and Nurse Practitioners) who all work together to provide you with the care you need, when you need it.  We recommend signing up for the patient portal called "MyChart".  Sign up information is provided on this After Visit Summary.  MyChart is used to connect with patients for Virtual Visits (Telemedicine).  Patients are able to view lab/test results, encounter notes, upcoming appointments, etc.  Non-urgent messages can be sent to your provider as well.   To learn more about what you can do with MyChart, go to [email or web address].    Keep scheduled appointment with Dr. Lavonne Prairie. You will see Jeronimo Moors in 1 year    Other Instructions HEART & VASCULAR CENTER  [address] [place], [place]  [address] OPENING APRIL 28,2025       1st Floor: - Lobby - Registration  - Pharmacy  - Lab - Cafe   2nd Floor: - PV Lab - Diagnostic Testing (echo, CT, nuclear med)   3rd Floor: - Vacant   4th Floor: - TCTS (cardiothoracic  surgery) - AFib Clinic - Structural Heart Clinic - Vascular Surgery  - Vascular Ultrasound   5th Floor: - HeartCare Cardiology (general and EP) - Clinical Pharmacy for coumadin, hypertension, lipid, weight-loss medications, and med management appointments      Valet parking services will be available as well.

## 2024-01-02 ENCOUNTER — Ambulatory Visit: Payer: Medicare Other | Admitting: Cardiology

## 2024-02-03 ENCOUNTER — Other Ambulatory Visit: Payer: Self-pay | Admitting: Cardiovascular Disease

## 2024-02-20 ENCOUNTER — Other Ambulatory Visit

## 2024-02-20 DIAGNOSIS — I10 Essential (primary) hypertension: Secondary | ICD-10-CM

## 2024-02-20 DIAGNOSIS — E785 Hyperlipidemia, unspecified: Secondary | ICD-10-CM | POA: Diagnosis not present

## 2024-02-20 DIAGNOSIS — R7303 Prediabetes: Secondary | ICD-10-CM

## 2024-02-20 LAB — BAYER DCA HB A1C WAIVED: HB A1C (BAYER DCA - WAIVED): 6 % — ABNORMAL HIGH (ref 4.8–5.6)

## 2024-02-20 LAB — LIPID PANEL

## 2024-02-21 LAB — CBC WITH DIFFERENTIAL/PLATELET
Basophils Absolute: 0 x10E3/uL (ref 0.0–0.2)
Basos: 1 %
EOS (ABSOLUTE): 0.1 x10E3/uL (ref 0.0–0.4)
Eos: 3 %
Hematocrit: 38.1 % (ref 34.0–46.6)
Hemoglobin: 12.7 g/dL (ref 11.1–15.9)
Immature Grans (Abs): 0 x10E3/uL (ref 0.0–0.1)
Immature Granulocytes: 0 %
Lymphocytes Absolute: 1.3 x10E3/uL (ref 0.7–3.1)
Lymphs: 28 %
MCH: 31.4 pg (ref 26.6–33.0)
MCHC: 33.3 g/dL (ref 31.5–35.7)
MCV: 94 fL (ref 79–97)
Monocytes Absolute: 0.5 x10E3/uL (ref 0.1–0.9)
Monocytes: 12 %
Neutrophils Absolute: 2.5 x10E3/uL (ref 1.4–7.0)
Neutrophils: 56 %
Platelets: 145 x10E3/uL — ABNORMAL LOW (ref 150–450)
RBC: 4.04 x10E6/uL (ref 3.77–5.28)
RDW: 13.2 % (ref 11.7–15.4)
WBC: 4.5 x10E3/uL (ref 3.4–10.8)

## 2024-02-21 LAB — CMP14+EGFR
ALT: 26 IU/L (ref 0–32)
AST: 34 IU/L (ref 0–40)
Albumin: 4.4 g/dL (ref 3.7–4.7)
Alkaline Phosphatase: 117 IU/L (ref 44–121)
BUN/Creatinine Ratio: 33 — AB (ref 12–28)
BUN: 17 mg/dL (ref 8–27)
Bilirubin Total: 0.3 mg/dL (ref 0.0–1.2)
CO2: 18 mmol/L — AB (ref 20–29)
Calcium: 9.3 mg/dL (ref 8.7–10.3)
Chloride: 107 mmol/L — ABNORMAL HIGH (ref 96–106)
Creatinine, Ser: 0.51 mg/dL — AB (ref 0.57–1.00)
Globulin, Total: 2.6 g/dL (ref 1.5–4.5)
Glucose: 93 mg/dL (ref 70–99)
Potassium: 4.2 mmol/L (ref 3.5–5.2)
Sodium: 142 mmol/L (ref 134–144)
Total Protein: 7 g/dL (ref 6.0–8.5)
eGFR: 90 mL/min/1.73 (ref >59)

## 2024-02-21 LAB — LIPID PANEL
Cholesterol, Total: 140 mg/dL (ref 100–199)
HDL: 42 mg/dL (ref >39)
LDL CALC COMMENT:: 3.3 ratio (ref 0.0–4.4)
LDL Chol Calc (NIH): 72 mg/dL (ref 0–99)
Triglycerides: 150 mg/dL — AB (ref 0–149)
VLDL Cholesterol Cal: 26 mg/dL (ref 5–40)

## 2024-02-22 ENCOUNTER — Encounter: Payer: Self-pay | Admitting: Family Medicine

## 2024-02-22 ENCOUNTER — Telehealth: Payer: Self-pay | Admitting: Family Medicine

## 2024-02-22 ENCOUNTER — Ambulatory Visit (INDEPENDENT_AMBULATORY_CARE_PROVIDER_SITE_OTHER): Admitting: Family Medicine

## 2024-02-22 VITALS — BP 172/63 | HR 63 | Temp 97.4°F | Ht 60.0 in | Wt 128.0 lb

## 2024-02-22 DIAGNOSIS — R7303 Prediabetes: Secondary | ICD-10-CM

## 2024-02-22 DIAGNOSIS — E785 Hyperlipidemia, unspecified: Secondary | ICD-10-CM

## 2024-02-22 DIAGNOSIS — I10 Essential (primary) hypertension: Secondary | ICD-10-CM

## 2024-02-22 NOTE — Telephone Encounter (Signed)
 Please order future lab orders for 06/23/2024 lab apt. Pt has apt with Dr [name] on 06/25/2024.

## 2024-02-22 NOTE — Progress Notes (Signed)
 BP (!) 172/63   Pulse 63   Temp (!) 97.4 F (36.3 C)   Ht 5' (1.524 m)   Wt 128 lb (58.1 kg)   SpO2 97%   BMI 25.00 kg/m    Subjective:   Patient ID: Melissa Carpenter, female    DOB: [birth year], 89 y.o.   MRN: [id number]  HPI: Melissa Carpenter is a 89 y.o. female presenting on 02/22/2024 for Medical Management of Chronic Issues, Hyperlipidemia, Hypertension, and Prediabetes   HPI Prediabetes  patient comes in today for recheck of his diabetes. Patient has been currently taking no medication, diet control. Patient is currently on an ACE inhibitor/ARB. Patient has not seen an ophthalmologist this year. Patient denies any new issues with their feet. The symptom started onset as an adult hypertension and hyperlipidemia ARE RELATED TO DM   Hypertension Patient is currently on amlodipine  and hydralazine  and furosemide  and metoprolol  and telmisartan , and their blood pressure today is 150/58. Patient denies any lightheadedness or dizziness. Patient denies headaches, blurred vision, chest pains, shortness of breath, or weakness. Denies any side effects from medication and is content with current medication.   Hyperlipidemia Patient is coming in for recheck of his hyperlipidemia. The patient is currently taking fish oil and atorvastatin . They deny any issues with myalgias or history of liver damage from it. They deny any focal numbness or weakness or chest pain.   Relevant past medical, surgical, family and social history reviewed and updated as indicated. Interim medical history since our last visit reviewed. Allergies and medications reviewed and updated.  Review of Systems  Constitutional:  Negative for chills and fever.  Eyes:  Negative for visual disturbance.  Respiratory:  Negative for chest tightness and shortness of breath.   Cardiovascular:  Negative for chest pain and leg swelling.  Genitourinary:  Negative for difficulty urinating and dysuria.  Musculoskeletal:  Negative for back pain and  gait problem.  Skin:  Negative for rash.  Neurological:  Negative for dizziness, light-headedness and headaches.  Psychiatric/Behavioral:  Negative for agitation and behavioral problems.   All other systems reviewed and are negative.   Per HPI unless specifically indicated above   Allergies as of 02/22/2024       Reactions   Codeine Hives   Forteo [parathyroid Hormone (recomb)] Other (See Comments)   Weakness and calcium  increase   Raloxifene Other (See Comments)   Eye problems   Morphine Rash   Penicillins Rash   Sulfonamide Derivatives Rash        Medication List        Accurate as of February 22, 2024 10:07 AM. If you have any questions, ask your nurse or doctor.          STOP taking these medications    azithromycin  500 MG tablet Commonly known as: Zithromax  Stopped by: Fonda LABOR Deardra Hinkley       TAKE these medications    Accu-Chek Softclix Lancets lancets Use to check blood sugars daily   acetaminophen  500 MG tablet Commonly known as: TYLENOL  Take 500 mg by mouth every 6 (six) hours as needed for moderate pain (pain score 4-6).   albuterol  108 (90 Base) MCG/ACT inhaler Commonly known as: VENTOLIN  HFA Inhale 2 puffs into the lungs every 6 (six) hours as needed for wheezing or shortness of breath.   amLODipine -atorvastatin  10-40 MG tablet Commonly known as: CADUET  Take 1 tablet by mouth daily.   Aspercreme Lidocaine  4 % Crea Generic drug: Lidocaine  HCl Apply 1 Application topically at  bedtime.   aspirin  81 MG tablet Take 81 mg by mouth daily.   Calcium  Carbonate-Vitamin D  600-400 MG-UNIT tablet Commonly known as: Caltrate 600+D Take 1 tablet by mouth daily.   cholecalciferol 25 MCG (1000 UNIT) tablet Commonly known as: VITAMIN D3 Take 1,000 Units by mouth daily.   fish oil-omega-3 fatty acids 1000 MG capsule Take 1 g by mouth daily.   fluticasone  50 MCG/ACT nasal spray Commonly known as: FLONASE  SPRAY 2 SPRAYS INTO EACH NOSTRIL EVERY DAY    furosemide  20 MG tablet Commonly known as: LASIX  TAKE 1 TABLET BY MOUTH EVERY DAY AS NEEDED   hydrALAZINE  25 MG tablet Commonly known as: APRESOLINE  TAKE 25 MG MORNING, 25 MG AT LUNCH, AND 50 MG ( 2 TABLETS ) AT BEDTIME   metoprolol  succinate 100 MG 24 hr tablet Commonly known as: TOPROL -XL Take 1 tablet (100 mg total) by mouth daily. TAKE WITH OR IMMEDIATELY FOLLOWING A MEAL.   multivitamin tablet Take 1 tablet by mouth daily.   polyethylene glycol powder 17 GM/SCOOP powder Commonly known as: GLYCOLAX/MIRALAX Take 17 g by mouth daily.   telmisartan  80 MG tablet Commonly known as: MICARDIS  Take 1 tablet (80 mg total) by mouth every evening.   triamcinolone  cream 0.1 % Commonly known as: KENALOG  APPLY TO AFFECTED AREA TWICE A DAY What changed: See the new instructions.         Objective:   BP (!) 172/63   Pulse 63   Temp (!) 97.4 F (36.3 C)   Ht 5' (1.524 m)   Wt 128 lb (58.1 kg)   SpO2 97%   BMI 25.00 kg/m   Wt Readings from Last 3 Encounters:  02/22/24 128 lb (58.1 kg)  12/03/23 129 lb 9.6 oz (58.8 kg)  11/08/23 129 lb 3.2 oz (58.6 kg)    Physical Exam Vitals and nursing note reviewed.  Constitutional:      General: She is not in acute distress.    Appearance: She is well-developed. She is not diaphoretic.  Eyes:     Conjunctiva/sclera: Conjunctivae normal.  Cardiovascular:     Rate and Rhythm: Normal rate and regular rhythm.     Heart sounds: Normal heart sounds. No murmur heard. Pulmonary:     Effort: Pulmonary effort is normal. No respiratory distress.     Breath sounds: Normal breath sounds. No wheezing.  Skin:    General: Skin is warm and dry.     Findings: No rash.  Neurological:     Mental Status: She is alert and oriented to person, place, and time.     Coordination: Coordination normal.  Psychiatric:        Behavior: Behavior normal.     Results for orders placed or performed in visit on 02/20/24  Bayer DCA Hb A1c Waived    Collection Time: 02/20/24  8:34 AM  Result Value Ref Range   HB A1C (BAYER DCA - WAIVED) 6.0 (H) 4.8 - 5.6 %  CBC with Differential/Platelet   Collection Time: 02/20/24  8:36 AM  Result Value Ref Range   WBC 4.5 3.4 - 10.8 x10E3/uL   RBC 4.04 3.77 - 5.28 x10E6/uL   Hemoglobin 12.7 11.1 - 15.9 g/dL   Hematocrit 61.8 65.9 - 46.6 %   MCV 94 79 - 97 fL   MCH 31.4 26.6 - 33.0 pg   MCHC 33.3 31.5 - 35.7 g/dL   RDW 86.7 88.2 - 84.5 %   Platelets 145 (L) 150 - 450 x10E3/uL   Neutrophils  56 Not Estab. %   Lymphs 28 Not Estab. %   Monocytes 12 Not Estab. %   Eos 3 Not Estab. %   Basos 1 Not Estab. %   Neutrophils Absolute 2.5 1.4 - 7.0 x10E3/uL   Lymphocytes Absolute 1.3 0.7 - 3.1 x10E3/uL   Monocytes Absolute 0.5 0.1 - 0.9 x10E3/uL   EOS (ABSOLUTE) 0.1 0.0 - 0.4 x10E3/uL   Basophils Absolute 0.0 0.0 - 0.2 x10E3/uL   Immature Granulocytes 0 Not Estab. %   Immature Grans (Abs) 0.0 0.0 - 0.1 x10E3/uL  Lipid panel   Collection Time: 02/20/24  8:36 AM  Result Value Ref Range   Cholesterol, Total 140 100 - 199 mg/dL   Triglycerides 849 (H) 0 - 149 mg/dL   HDL 42 >60 mg/dL   VLDL Cholesterol Cal 26 5 - 40 mg/dL   LDL Chol Calc (NIH) 72 0 - 99 mg/dL   Chol/HDL Ratio 3.3 0.0 - 4.4 ratio  CMP14+EGFR   Collection Time: 02/20/24  8:36 AM  Result Value Ref Range   Glucose 93 70 - 99 mg/dL   BUN 17 8 - 27 mg/dL   Creatinine, Ser 9.48 (L) 0.57 - 1.00 mg/dL   eGFR 90 >40 fO/fpw/8.26   BUN/Creatinine Ratio 33 (H) 12 - 28   Sodium 142 134 - 144 mmol/L   Potassium 4.2 3.5 - 5.2 mmol/L   Chloride 107 (H) 96 - 106 mmol/L   CO2 18 (L) 20 - 29 mmol/L   Calcium  9.3 8.7 - 10.3 mg/dL   Total Protein 7.0 6.0 - 8.5 g/dL   Albumin 4.4 3.7 - 4.7 g/dL   Globulin, Total 2.6 1.5 - 4.5 g/dL   Bilirubin Total 0.3 0.0 - 1.2 mg/dL   Alkaline Phosphatase 117 44 - 121 IU/L   AST 34 0 - 40 IU/L   ALT 26 0 - 32 IU/L    Assessment & Plan:   Problem List Items Addressed This Visit       Cardiovascular and  Mediastinum   Essential hypertension - Primary (Chronic)     Other   Dyslipidemia (Chronic)   Pre-diabetes    Blood work and everything looks good.  Blood pressure shows mild elevation at 150/58 but with a wide pulse pressure I am not going to adjust her medications especially with her age. Follow up plan: Return in about 4 months (around 06/24/2024), or if symptoms worsen or fail to improve, for Prediabetes and hyperlipidemia.  Counseling provided for all of the vaccine components No orders of the defined types were placed in this encounter.   Fonda Levins, MD [facility] 02/22/2024, 10:07 AM

## 2024-02-22 NOTE — Telephone Encounter (Signed)
 DXA appt made for 02/26/24

## 2024-02-26 ENCOUNTER — Ambulatory Visit (INDEPENDENT_AMBULATORY_CARE_PROVIDER_SITE_OTHER)

## 2024-02-26 ENCOUNTER — Other Ambulatory Visit: Payer: Self-pay | Admitting: Family Medicine

## 2024-02-26 DIAGNOSIS — M8589 Other specified disorders of bone density and structure, multiple sites: Secondary | ICD-10-CM | POA: Diagnosis not present

## 2024-02-26 DIAGNOSIS — Z780 Asymptomatic menopausal state: Secondary | ICD-10-CM | POA: Diagnosis not present

## 2024-03-04 ENCOUNTER — Ambulatory Visit: Payer: Self-pay

## 2024-03-04 ENCOUNTER — Encounter: Payer: Self-pay | Admitting: Family

## 2024-03-04 ENCOUNTER — Ambulatory Visit (INDEPENDENT_AMBULATORY_CARE_PROVIDER_SITE_OTHER): Admitting: Family

## 2024-03-04 VITALS — BP 152/62 | HR 62 | Temp 98.4°F | Ht 60.0 in | Wt 129.4 lb

## 2024-03-04 DIAGNOSIS — J208 Acute bronchitis due to other specified organisms: Secondary | ICD-10-CM

## 2024-03-04 DIAGNOSIS — B9689 Other specified bacterial agents as the cause of diseases classified elsewhere: Secondary | ICD-10-CM

## 2024-03-04 MED ORDER — AZITHROMYCIN 250 MG PO TABS
ORAL_TABLET | ORAL | 0 refills | Status: DC
Start: 2024-03-04 — End: 2024-06-25

## 2024-03-04 MED ORDER — BENZONATATE 200 MG PO CAPS
200.0000 mg | ORAL_CAPSULE | Freq: Three times a day (TID) | ORAL | 1 refills | Status: AC | PRN
Start: 2024-03-04 — End: ?

## 2024-03-04 NOTE — Telephone Encounter (Deleted)
 Melissa Carpenter

## 2024-03-04 NOTE — Telephone Encounter (Signed)
 Patient scheduled to see Bari learn today 03/04/2024 at 3:25pm

## 2024-03-04 NOTE — Telephone Encounter (Signed)
 FYI Only or Action Required?: FYI only for provider.  Patient was last seen in primary care on 02/22/2024 by [name], Fonda LABOR, MD.  Called Nurse Triage reporting Cough, wheezing, sinus pressure  Symptoms began several days ago.  Interventions attempted: Nothing.  Symptoms are: gradually worsening.  Triage Disposition: See HCP Within 4 Hours (Or PCP Triage)  Patient/caregiver understands and will follow disposition?: Yes   Copied from CRM [id number]. Topic: Clinical - Pink Word Triage >> Mar 04, 2024  8:30 AM Diannia H wrote: Reason for Triage: Patient is trying to be seen today, she has a cough, she doesn't have a headache, sinus or anything like that but her throat is very sore, could you assist? Patients callback number is [phone]. >> Mar 04, 2024  8:31 AM Diannia H wrote: Patient is trying to be seen today, she has a cough, she doesn't have a headache, sinus or anything like that but her throat is very sore, could you assist? Patients callback number is [phone]. Reason for Disposition  Wheezing is present  Answer Assessment - Initial Assessment Questions 1. ONSET: When did the cough begin?      Two days ago, worsened yesterday 2. SEVERITY: How bad is the cough today?      Patient states her cough is bad 3. SPUTUM: Describe the color of your sputum (e.g., none, dry cough; clear, white, yellow, green)     Tan 4. HEMOPTYSIS: Are you coughing up any blood? If Yes, ask: How much? (e.g., flecks, streaks, tablespoons, etc.)     No 5. DIFFICULTY BREATHING: Are you having difficulty breathing? If Yes, ask: How bad is it? (e.g., mild, moderate, severe)      Patient states she is experiencing a difference in her shortness of breath 6. FEVER: Do you have a fever? If Yes, ask: What is your temperature, how was it measured, and when did it start?     No 7. CARDIAC HISTORY: Do you have any history of heart disease? (e.g., heart attack, congestive heart  failure)      Heart valve replacement 3 months ago, no other cardiac history 8. LUNG HISTORY: Do you have any history of lung disease?  (e.g., pulmonary embolus, asthma, emphysema)     Patient denies 9. PE RISK FACTORS: Do you have a history of blood clots? (or: recent major surgery, recent prolonged travel, bedridden)     No 10. OTHER SYMPTOMS: Do you have any other symptoms? (e.g., runny nose, wheezing, chest pain)       Patient states right ear is stopped up, wheezing, mild chest pain    Patient denies radiation of chest pain to back, arms, jaw, or neck  Protocols used: Cough - Acute Productive-A-AH

## 2024-03-04 NOTE — Telephone Encounter (Signed)
 FYI Only or Action Required?: FYI only for provider.  Patient was last seen in primary care on 02/22/2024 by [name], Fonda LABOR, MD.  Called Nurse Triage reporting No chief complaint on file..  Symptoms began several days ago.  Interventions attempted: OTC medications: mucinex  and tylenol .  Symptoms are: gradually worsening.  Triage Disposition: See Today in Office (overriding See HCP Within 4 Hours (Or PCP Triage))  Patient/caregiver understands and will follow disposition?: Yes  Reason for Disposition  Wheezing is present  Answer Assessment - Initial Assessment Questions Patient denied higher acuity questions. ED/911 precautions reviewed by this RN including chest pain, shortness of breath or wheezing that does not improve with coughing and/or occurs at rest. Patient verbalized understanding. Already scheduled for soonest same day visit.  1. ONSET: When did the cough begin?      2-3 days  2. SEVERITY: How bad is the cough today?      Moderate, worse at night  3. SPUTUM: Describe the color of your sputum (e.g., none, dry cough; clear, white, yellow, green)     Tan  4. HEMOPTYSIS: Are you coughing up any blood? If Yes, ask: How much? (e.g., flecks, streaks, tablespoons, etc.)     No  5. DIFFICULTY BREATHING: Are you having difficulty breathing? If Yes, ask: How bad is it? (e.g., mild, moderate, severe)      States only when coughing  7. CARDIAC HISTORY: Do you have any history of heart disease? (e.g., heart attack, congestive heart failure)      Yes  8. LUNG HISTORY: Do you have any history of lung disease?  (e.g., pulmonary embolus, asthma, emphysema)     Denies  10. OTHER SYMPTOMS: Do you have any other symptoms? (e.g., runny nose, wheezing, chest pain)       Wheezing, right before coughing. States cough resolves wheezing.  Protocols used: Cough - Acute Productive-A-AH  Message from [place] H sent at 03/04/2024 10:16 AM EDT  Reason for Triage: pt  states she came in May, given pills for coughing and she is still coughing.  They did not work. She continues to cough. She wants sooner appt

## 2024-03-04 NOTE — Patient Instructions (Signed)

## 2024-03-04 NOTE — Telephone Encounter (Signed)
  1st attempt at calling patient---Busy signal                Patient is trying to be seen today, she has a cough, she doesn't have a headache, sinus or anything like that but her throat is very sore, could you assist? Patients callback number is [phone].

## 2024-03-04 NOTE — Telephone Encounter (Signed)
 Noted. appt made.

## 2024-03-04 NOTE — Progress Notes (Signed)
 Subjective:    Patient ID: Melissa Carpenter, female    DOB: Nov 09, 1933, 89 y.o.   MRN: [id number]  Chief Complaint  Patient presents with   Medical Management of Chronic Issues   PT presents to the office today with a cough that started 3 days ago. Reports she thought she was getting better, but today has worsen.  Cough This is a new problem. The current episode started in the past 7 days. The problem has been waxing and waning. The problem occurs every few minutes. The cough is Productive of purulent sputum. Associated symptoms include ear congestion and wheezing. Pertinent negatives include no ear pain, fever, headaches, nasal congestion, postnasal drip, rhinorrhea, sore throat, shortness of breath or weight loss. Associated symptoms comments: Sinus pressure . The symptoms are aggravated by other. She has tried rest and OTC cough suppressant for the symptoms. The treatment provided mild relief.      Review of Systems  Constitutional:  Negative for fever and weight loss.  HENT:  Negative for ear pain, postnasal drip, rhinorrhea and sore throat.   Respiratory:  Positive for cough and wheezing. Negative for shortness of breath.   Neurological:  Negative for headaches.  All other systems reviewed and are negative.   Social History   Socioeconomic History   Marital status: Married    Spouse name: Ferdie Child psychotherapist)   Number of children: 4   Years of education: Not on file   Highest education level: Not on file  Occupational History   Occupation: retired    Associate Professor: [employer]  Tobacco Use   Smoking status: Never   Smokeless tobacco: Never  Vaping Use   Vaping status: Never Used  Substance and Sexual Activity   Alcohol use: No    Alcohol/week: 0.0 standard drinks of alcohol   Drug use: No   Sexual activity: Never  Other Topics Concern   Not on file  Social History Narrative   Lives in 2 story home with her husband   Children live nearby   Social Drivers of Health    Financial Resource Strain: Low Risk  (05/14/2023)   Overall Financial Resource Strain (CARDIA)    Difficulty of Paying Living Expenses: Not hard at all  Food Insecurity: No Food Insecurity (10/30/2023)   Hunger Vital Sign    Worried About Running Out of Food in the Last Year: Never true    Ran Out of Food in the Last Year: Never true  Transportation Needs: No Transportation Needs (10/30/2023)   PRAPARE - Administrator, Civil Service (Medical): No    Lack of Transportation (Non-Medical): No  Physical Activity: Inactive (05/14/2023)   Exercise Vital Sign    Days of Exercise per Week: 0 days    Minutes of Exercise per Session: 0 min  Stress: No Stress Concern Present (05/14/2023)   [employer] of Occupational Health - Occupational Stress Questionnaire    Feeling of Stress : Not at all  Social Connections: Moderately Integrated (10/31/2023)   Social Connection and Isolation Panel    Frequency of Communication with Friends and Family: More than three times a week    Frequency of Social Gatherings with Friends and Family: Three times a week    Attends Religious Services: More than 4 times per year    Active Member of Clubs or Organizations: No    Attends Banker Meetings: Never    Marital Status: Married   Family History  Problem Relation Age of Onset  Heart attack Mother    Hypertension Mother    Throat cancer Mother        THROAT / VOCAL CORD   Heart disease Mother    Hypertension Father    Heart disease Father    Kidney disease Father        failure   Bone cancer Sister    Stroke Sister    Bone cancer Grandchild        in rib cage   Hypertension Sister    Metabolic syndrome Sister        pre diabetes   GER disease Sister    Heart disease Sister        CAD   Heart attack Brother    Cirrhosis Brother    Breast cancer Sister    Bone cancer Sister    Hyperlipidemia Sister    Heart attack Sister    Stroke Son    Colitis Neg Hx     Esophageal cancer Neg Hx    Stomach cancer Neg Hx    Rectal cancer Neg Hx         Objective:   Physical Exam Vitals reviewed.  Constitutional:      General: She is not in acute distress.    Appearance: She is well-developed.  HENT:     Head: Normocephalic and atraumatic.  Eyes:     Pupils: Pupils are equal, round, and reactive to light.  Neck:     Thyroid : No thyromegaly.  Cardiovascular:     Rate and Rhythm: Normal rate and regular rhythm.     Heart sounds: Normal heart sounds. No murmur heard. Pulmonary:     Effort: Pulmonary effort is normal. No respiratory distress.     Breath sounds: Rhonchi present. No wheezing.  Abdominal:     General: Bowel sounds are normal. There is no distension.     Palpations: Abdomen is soft.     Tenderness: There is no abdominal tenderness.  Musculoskeletal:        General: No tenderness. Normal range of motion.     Cervical back: Normal range of motion and neck supple.  Skin:    General: Skin is warm and dry.  Neurological:     Mental Status: She is alert and oriented to person, place, and time.     Cranial Nerves: No cranial nerve deficit.     Deep Tendon Reflexes: Reflexes are normal and symmetric.  Psychiatric:        Behavior: Behavior normal.        Thought Content: Thought content normal.        Judgment: Judgment normal.       BP (!) 152/62   Pulse 62   Temp 98.4 F (36.9 C) (Temporal)   Ht 5' (1.524 m)   Wt 129 lb 6.4 oz (58.7 kg)   SpO2 94%   BMI 25.27 kg/m      Assessment & Plan:  Renezmae Canlas comes in today with chief complaint of Medical Management of Chronic Issues   Diagnosis and orders addressed:  1. Acute bacterial bronchitis (Primary) - Take meds as prescribed - Use a cool mist humidifier  -Use saline nose sprays frequently -Force fluids -For any cough or congestion  Use plain Mucinex - regular strength or max strength is fine -For fever or aces or pains- take tylenol  or ibuprofen. -Throat lozenges  if help -Follow up if symptoms worsen or do not improve  - azithromycin  (ZITHROMAX ) 250 MG tablet; Take 500 mg  once, then 250 mg for four days  Dispense: 6 tablet; Refill: 0 - benzonatate  (TESSALON ) 200 MG capsule; Take 1 capsule (200 mg total) by mouth 3 (three) times daily as needed.  Dispense: 30 capsule; Refill: 1   Bari Learn, FNP

## 2024-03-16 NOTE — Progress Notes (Unsigned)
  Cardiology Office Note:   Date:  03/19/2024  ID:  Melissa Carpenter, DOB 1934/02/07, MRN [id number] PCP: [name], Fonda LABOR, MD  [facility] Providers Cardiologist:  Lynwood Schilling, MD {  History of Present Illness:   Melissa Carpenter is a 89 y.o. female with a hx of severe aortic stenosis s/p TAVR (10/30/23) who presents to clinic for follow up.  She was seen by Dr. Verlin for post hospital follow up. She was having some dizziness and palpitations as well as worsening DOE and LE edema. She was started on Lasix  20mg  daily x 3 days followed by PRN and a cardiac monitor was placed. This showed no significant arrhythmias.    She presents for follow up.  Since I last saw her she has done well.  She denies any new cardiovascular symptoms.  She has no new shortness of breath, PND or orthopnea.  She is not feeling the pulsing in her ears that she was having.  She is not really having any dizziness.  She does her shopping and takes care of her house.  Her husband has dementia which is a little bit challenging.  She is limited by some hip and back pain.   ROS: As stated in the HPI and negative for all other systems.  Studies Reviewed:    EKG:   EKG Interpretation Date/Time:  Wednesday March 19 2024 10:28:21 EDT Ventricular Rate:  59 PR Interval:  140 QRS Duration:  126 QT Interval:  444 QTC Calculation: 439 R Axis:   41  Text Interpretation: Sinus bradycardia with sinus arrhythmia Right bundle branch block When compared with ECG of 31-Oct-2023 05:34, Right bundle branch block is now Present Confirmed by Schilling Lynwood ([id number]) on 03/19/2024 10:44:34 AM    Risk Assessment/Calculations:              Physical Exam:   VS:  BP (!) 130/50   Pulse (!) 59   Ht 5' (1.524 m)   Wt 128 lb (58.1 kg)   BMI 25.00 kg/m    Wt Readings from Last 3 Encounters:  03/19/24 128 lb (58.1 kg)  03/18/24 128 lb (58.1 kg)  03/04/24 129 lb 6.4 oz (58.7 kg)     GEN: Well nourished, well developed in no acute  distress NECK: No JVD; No carotid bruits CARDIAC: RRR, 2 out of 6 apical systolic murmur radiating up the aortic outflow tract and early peaking, no diastolic murmurs, rubs, gallops RESPIRATORY:  Clear to auscultation without rales, wheezing or rhonchi  ABDOMEN: Soft, non-tender, non-distended EXTREMITIES:  No edema; No deformity   ASSESSMENT AND PLAN:   Severe AS s/p TAVR: She had normal valve function on last echo and is due to have follow-up again in March.  She understands endocarditis prophylaxis.  No change in therapy.  HTN: The blood pressure is at target.  No change in therapy.   Chronic HFpEF: She seems to be euvolemic.  No change in therapy.  HLD: LDL is 72.  She has minimal coronary plaque.  This is target.  No change in therapy.   Lesion of liver: She has discussed this with her primary provider and there is no further imaging planned.  RBBB: This is new since previous but she is not having any symptoms related to this.  No change in therapy.      Follow up with me in one year.,    Signed, Lynwood Schilling, MD

## 2024-03-18 ENCOUNTER — Encounter: Payer: Self-pay | Admitting: Family Medicine

## 2024-03-18 ENCOUNTER — Ambulatory Visit (INDEPENDENT_AMBULATORY_CARE_PROVIDER_SITE_OTHER): Admitting: Family Medicine

## 2024-03-18 VITALS — BP 121/54 | HR 64 | Temp 97.4°F | Ht 60.0 in | Wt 128.0 lb

## 2024-03-18 DIAGNOSIS — J329 Chronic sinusitis, unspecified: Secondary | ICD-10-CM

## 2024-03-18 DIAGNOSIS — J40 Bronchitis, not specified as acute or chronic: Secondary | ICD-10-CM | POA: Diagnosis not present

## 2024-03-18 MED ORDER — HYDROCODONE BIT-HOMATROP MBR 5-1.5 MG/5ML PO SOLN: 5.0000 mL | Freq: Four times a day (QID) | ORAL | 0 refills | Status: AC | PRN

## 2024-03-18 MED ORDER — MOXIFLOXACIN HCL 400 MG PO TABS: 400.0000 mg | ORAL_TABLET | Freq: Every day | ORAL | 0 refills | Status: DC

## 2024-03-18 NOTE — Progress Notes (Signed)
 Subjective:  Patient ID: Melissa Carpenter, female    DOB: 06/11/1934  Age: 89 y.o. MRN: [id number]  CC: Bronchitis  Having to sit  HPI Melissa Carpenter presents for having to sit up to decrease cough. Right ear stopped up. Cough producing pale, tan sputum. Not dyspneic. No fever. No PND. Thick clear rhinorrhea. Copious. Seen on 7/22. Took a zpack and tessalon . Some improvement noted.     03/18/2024    8:40 AM 02/22/2024    9:46 AM 02/22/2024    9:45 AM  Depression screen PHQ 2/9  Decreased Interest 0  0  Down, Depressed, Hopeless 0  0  PHQ - 2 Score 0  0  Altered sleeping  0   Tired, decreased energy  0   Change in appetite  0   Feeling bad or failure about yourself   0   Trouble concentrating  0   Moving slowly or fidgety/restless  0   Suicidal thoughts  0   Difficult doing work/chores  Not difficult at all     History Melissa Carpenter has a past medical history of Carotid artery disease (HCC), Cataract, Chest pain, DJD (degenerative joint disease), Dyslipidemia, GERD (gastroesophageal reflux disease), Hyperlipidemia, Hypertension, Mitral valve prolapse, Osteoporosis, Prediabetes, S/P TAVR (transcatheter aortic valve replacement) (10/30/2023), Severe aortic stenosis, and Thyroid  nodule.   She has a past surgical history that includes Cardiac catheterization (04/2002); Appendectomy (1946); Tonsillectomy; Biopsy thyroid  (Left); Abdominal hysterectomy (1964); Nasal sinus surgery; Cataract extraction, bilateral; Colonoscopy; RIGHT HEART CATH AND CORONARY ANGIOGRAPHY (N/A, 09/05/2023); and Intraoperative transthoracic echocardiogram (N/A, 10/30/2023).   Her family history includes Bone cancer in her grandchild, sister, and sister; Breast cancer in her sister; Cirrhosis in her brother; GER disease in her sister; Heart attack in her brother, mother, and sister; Heart disease in her father, mother, and sister; Hyperlipidemia in her sister; Hypertension in her father, mother, and sister; Kidney disease in her father;  Metabolic syndrome in her sister; Stroke in her sister and son; Throat cancer in her mother.She reports that she has never smoked. She has never used smokeless tobacco. She reports that she does not drink alcohol and does not use drugs.    ROS Review of Systems  Objective:  BP (!) 121/54   Pulse 64   Temp (!) 97.4 F (36.3 C)   Ht 5' (1.524 m)   Wt 128 lb (58.1 kg)   SpO2 96%   BMI 25.00 kg/m   BP Readings from Last 3 Encounters:  03/18/24 (!) 121/54  03/04/24 (!) 152/62  02/22/24 (!) 172/63    Wt Readings from Last 3 Encounters:  03/18/24 128 lb (58.1 kg)  03/04/24 129 lb 6.4 oz (58.7 kg)  02/22/24 128 lb (58.1 kg)     Physical Exam Constitutional:      Appearance: She is well-developed.  HENT:     Head: Normocephalic and atraumatic.     Right Ear: Tympanic membrane and external ear normal. No decreased hearing noted.     Left Ear: Tympanic membrane and external ear normal. No decreased hearing noted.     Nose: Mucosal edema present.     Right Sinus: No frontal sinus tenderness.     Left Sinus: No frontal sinus tenderness.     Mouth/Throat:     Pharynx: No oropharyngeal exudate or posterior oropharyngeal erythema.  Neck:     Meningeal: Brudzinski's sign absent.  Pulmonary:     Effort: No respiratory distress.     Breath sounds: Rhonchi (scattered) present. No rales.  Lymphadenopathy:     Head:     Right side of head: No preauricular adenopathy.     Left side of head: No preauricular adenopathy.     Cervical:     Right cervical: No superficial cervical adenopathy.    Left cervical: No superficial cervical adenopathy.      Assessment & Plan:  Sinobronchitis  Other orders -     Moxifloxacin  HCl; Take 1 tablet (400 mg total) by mouth daily.  Dispense: 10 tablet; Refill: 0 -     HYDROcodone  Bit-Homatrop MBr; Take 5 mLs by mouth every 6 (six) hours as needed for up to 5 days for cough.  Dispense: 100 mL; Refill: 0     Follow-up: No follow-ups on  file.  Butler Der, M.D.

## 2024-03-19 ENCOUNTER — Ambulatory Visit (INDEPENDENT_AMBULATORY_CARE_PROVIDER_SITE_OTHER): Admitting: Cardiology

## 2024-03-19 ENCOUNTER — Encounter: Payer: Self-pay | Admitting: Cardiology

## 2024-03-19 VITALS — BP 130/50 | HR 59 | Ht 60.0 in | Wt 128.0 lb

## 2024-03-19 DIAGNOSIS — I5032 Chronic diastolic (congestive) heart failure: Secondary | ICD-10-CM

## 2024-03-19 DIAGNOSIS — I10 Essential (primary) hypertension: Secondary | ICD-10-CM

## 2024-03-19 DIAGNOSIS — E785 Hyperlipidemia, unspecified: Secondary | ICD-10-CM | POA: Diagnosis not present

## 2024-03-19 DIAGNOSIS — Z952 Presence of prosthetic heart valve: Secondary | ICD-10-CM | POA: Diagnosis not present

## 2024-03-19 NOTE — Patient Instructions (Signed)
 Medication Instructions:  Your physician recommends that you continue on your current medications as directed. Please refer to the Current Medication list given to you today.  Labwork: none  Testing/Procedures: none  Follow-Up: Your physician recommends that you schedule a follow-up appointment in: 1 year. You will receive a reminder call in about 8 months reminding you to schedule your appointment. If you don't receive this call, please contact our office.  Any Other Special Instructions Will Be Listed Below (If Applicable).  If you need a refill on your cardiac medications before your next appointment, please call your pharmacy.

## 2024-04-30 ENCOUNTER — Encounter: Payer: Self-pay | Admitting: Family Medicine

## 2024-04-30 ENCOUNTER — Ambulatory Visit

## 2024-06-03 ENCOUNTER — Other Ambulatory Visit: Payer: Self-pay

## 2024-06-03 DIAGNOSIS — Z952 Presence of prosthetic heart valve: Secondary | ICD-10-CM

## 2024-06-20 ENCOUNTER — Telehealth: Payer: Self-pay | Admitting: Family Medicine

## 2024-06-20 ENCOUNTER — Other Ambulatory Visit: Payer: Self-pay

## 2024-06-20 DIAGNOSIS — E785 Hyperlipidemia, unspecified: Secondary | ICD-10-CM

## 2024-06-20 DIAGNOSIS — I10 Essential (primary) hypertension: Secondary | ICD-10-CM

## 2024-06-20 DIAGNOSIS — R7303 Prediabetes: Secondary | ICD-10-CM

## 2024-06-20 NOTE — Telephone Encounter (Signed)
 Pt has Appt with Dr. JONETTA. 06/25/2024 and has lab appt on 02/21/2024, please add lab orders

## 2024-06-20 NOTE — Telephone Encounter (Signed)
 Labs ordered.

## 2024-06-23 ENCOUNTER — Other Ambulatory Visit: Payer: Self-pay

## 2024-06-23 DIAGNOSIS — R7303 Prediabetes: Secondary | ICD-10-CM | POA: Diagnosis not present

## 2024-06-23 DIAGNOSIS — I10 Essential (primary) hypertension: Secondary | ICD-10-CM

## 2024-06-23 DIAGNOSIS — E785 Hyperlipidemia, unspecified: Secondary | ICD-10-CM | POA: Diagnosis not present

## 2024-06-23 LAB — CBC WITH DIFFERENTIAL/PLATELET
Basophils Absolute: 0 x10E3/uL (ref 0.0–0.2)
Basos: 1 %
EOS (ABSOLUTE): 0.1 x10E3/uL (ref 0.0–0.4)
Eos: 2 %
Hematocrit: 40 % (ref 34.0–46.6)
Hemoglobin: 13.1 g/dL (ref 11.1–15.9)
Immature Grans (Abs): 0 x10E3/uL (ref 0.0–0.1)
Immature Granulocytes: 0 %
Lymphocytes Absolute: 1.3 x10E3/uL (ref 0.7–3.1)
Lymphs: 26 %
MCH: 32 pg (ref 26.6–33.0)
MCHC: 32.8 g/dL (ref 31.5–35.7)
MCV: 98 fL — ABNORMAL HIGH (ref 79–97)
Monocytes Absolute: 0.5 x10E3/uL (ref 0.1–0.9)
Monocytes: 11 %
Neutrophils Absolute: 3 x10E3/uL (ref 1.4–7.0)
Neutrophils: 60 %
Platelets: 141 x10E3/uL — ABNORMAL LOW (ref 150–450)
RBC: 4.1 x10E6/uL (ref 3.77–5.28)
RDW: 13 % (ref 11.7–15.4)
WBC: 4.9 x10E3/uL (ref 3.4–10.8)

## 2024-06-23 LAB — CMP14+EGFR
ALT: 19 IU/L (ref 0–32)
AST: 27 IU/L (ref 0–40)
Albumin: 4.6 g/dL (ref 3.7–4.7)
Alkaline Phosphatase: 114 IU/L (ref 48–129)
BUN/Creatinine Ratio: 30 — ABNORMAL HIGH (ref 12–28)
BUN: 17 mg/dL (ref 8–27)
Bilirubin Total: 0.4 mg/dL (ref 0.0–1.2)
CO2: 22 mmol/L (ref 20–29)
Calcium: 9.3 mg/dL (ref 8.7–10.3)
Chloride: 106 mmol/L (ref 96–106)
Creatinine, Ser: 0.57 mg/dL (ref 0.57–1.00)
Globulin, Total: 2.4 g/dL (ref 1.5–4.5)
Glucose: 99 mg/dL (ref 70–99)
Potassium: 3.9 mmol/L (ref 3.5–5.2)
Sodium: 143 mmol/L (ref 134–144)
Total Protein: 7 g/dL (ref 6.0–8.5)
eGFR: 87 mL/min/1.73 (ref >59)

## 2024-06-23 LAB — BAYER DCA HB A1C WAIVED: HB A1C (BAYER DCA - WAIVED): 5.5 % (ref 4.8–5.6)

## 2024-06-23 LAB — LIPID PANEL
Chol/HDL Ratio: 2.8 ratio (ref 0.0–4.4)
Cholesterol, Total: 136 mg/dL (ref 100–199)
HDL: 48 mg/dL (ref >39)
LDL Chol Calc (NIH): 62 mg/dL (ref 0–99)
Triglycerides: 150 mg/dL — ABNORMAL HIGH (ref 0–149)
VLDL Cholesterol Cal: 26 mg/dL (ref 5–40)

## 2024-06-23 LAB — TSH: TSH: 1.35 u[IU]/mL (ref 0.450–4.500)

## 2024-06-25 ENCOUNTER — Ambulatory Visit: Payer: Self-pay | Admitting: Family Medicine

## 2024-06-25 ENCOUNTER — Encounter: Payer: Self-pay | Admitting: Family Medicine

## 2024-06-25 VITALS — BP 162/61 | HR 59 | Ht 60.0 in | Wt 123.0 lb

## 2024-06-25 DIAGNOSIS — J069 Acute upper respiratory infection, unspecified: Secondary | ICD-10-CM

## 2024-06-25 DIAGNOSIS — E041 Nontoxic single thyroid nodule: Secondary | ICD-10-CM

## 2024-06-25 DIAGNOSIS — Z23 Encounter for immunization: Secondary | ICD-10-CM

## 2024-06-25 DIAGNOSIS — E785 Hyperlipidemia, unspecified: Secondary | ICD-10-CM | POA: Diagnosis not present

## 2024-06-25 DIAGNOSIS — I10 Essential (primary) hypertension: Secondary | ICD-10-CM | POA: Diagnosis not present

## 2024-06-25 DIAGNOSIS — R0981 Nasal congestion: Secondary | ICD-10-CM | POA: Diagnosis not present

## 2024-06-25 DIAGNOSIS — R7303 Prediabetes: Secondary | ICD-10-CM

## 2024-06-25 MED ORDER — AMLODIPINE-ATORVASTATIN 10-40 MG PO TABS: 1.0000 | ORAL_TABLET | Freq: Every day | ORAL | 3 refills | Status: DC

## 2024-06-25 MED ORDER — FLUTICASONE PROPIONATE 50 MCG/ACT NA SUSP: 1.0000 | Freq: Every evening | NASAL | 1 refills | Status: AC | PRN

## 2024-06-25 MED ORDER — METOPROLOL SUCCINATE ER 100 MG PO TB24: 100.0000 mg | ORAL_TABLET | Freq: Every day | ORAL | 3 refills | Status: DC

## 2024-06-25 NOTE — Progress Notes (Signed)
 BP (!) 162/61   Pulse (!) 59   Ht 5' (1.524 m)   Wt 123 lb (55.8 kg)   SpO2 96%   BMI 24.02 kg/m    Subjective:   Patient ID: Melissa Carpenter, female    DOB: February 25, 1934, 89 y.o.   MRN: [id number]  HPI: Melissa Carpenter is a 89 y.o. female presenting on 06/25/2024 for Medical Management of Chronic Issues, Hypertension, and Prediabetes   Discussed the use of AI scribe software for clinical note transcription with the patient, who gave verbal consent to proceed.  History of Present Illness   The patient presents for a recheck of their blood pressure and review of recent blood work.  Blood pressure fluctuations and palpitations - Home blood pressure readings range from 120 to 162/61 mmHg - Episodes of increased heart rate occur when getting up during the night - Concern regarding risk of falls if blood pressure medication is adjusted  Ear and head congestion - Persistent congestion and pressure in ear and head - Currently not using Flonase  nasal spray, but has used it in the past and requests refill for symptom relief  Visual disturbances - Experiences visual phenomena described as 'migraines with no pain' - No recent eye examination  Cholesterol management - Currently taking fish oil supplements for cholesterol - No longer taking furosemide  (Lasix ), which was previously prescribed by cardiologist          Relevant past medical, surgical, family and social history reviewed and updated as indicated. Interim medical history since our last visit reviewed. Allergies and medications reviewed and updated.  Review of Systems  Constitutional:  Negative for chills and fever.  HENT:  Positive for congestion and sinus pressure. Negative for ear pain, sinus pain and sore throat.   Eyes:  Negative for visual disturbance.  Respiratory:  Negative for chest tightness and shortness of breath.   Cardiovascular:  Negative for chest pain and leg swelling.  Genitourinary:  Negative for difficulty  urinating and dysuria.  Musculoskeletal:  Negative for back pain and gait problem.  Skin:  Negative for rash.  Neurological:  Positive for headaches. Negative for light-headedness.  Psychiatric/Behavioral:  Negative for agitation and behavioral problems.   All other systems reviewed and are negative.   Per HPI unless specifically indicated above   Allergies as of 06/25/2024       Reactions   Codeine Hives   Forteo [parathyroid Hormone (recomb)] Other (See Comments)   Weakness and calcium  increase   Raloxifene Other (See Comments)   Eye problems   Morphine Rash   Penicillins Rash   Sulfonamide Derivatives Rash        Medication List        Accurate as of June 25, 2024 10:36 AM. If you have any questions, ask your nurse or doctor.          STOP taking these medications    azithromycin  250 MG tablet Commonly known as: ZITHROMAX  Stopped by: Fonda LABOR Clois Montavon   moxifloxacin  400 MG tablet Commonly known as: AVELOX  Stopped by: [facility]OR Marquie Aderhold       TAKE these medications    Accu-Chek Softclix Lancets lancets Use to check blood sugars daily   acetaminophen  500 MG tablet Commonly known as: TYLENOL  Take 500 mg by mouth every 6 (six) hours as needed for moderate pain (pain score 4-6).   albuterol  108 (90 Base) MCG/ACT inhaler Commonly known as: VENTOLIN  HFA Inhale 2 puffs into the lungs every 6 (six) hours as needed  for wheezing or shortness of breath.   amLODipine -atorvastatin  10-40 MG tablet Commonly known as: CADUET  Take 1 tablet by mouth daily.   Aspercreme Lidocaine  4 % Crea Generic drug: Lidocaine  HCl Apply 1 Application topically at bedtime.   aspirin  81 MG tablet Take 81 mg by mouth daily.   benzonatate  200 MG capsule Commonly known as: TESSALON  Take 1 capsule (200 mg total) by mouth 3 (three) times daily as needed.   Calcium  Carbonate-Vitamin D  600-400 MG-UNIT tablet Commonly known as: Caltrate 600+D Take 1 tablet by mouth daily.    cholecalciferol 25 MCG (1000 UNIT) tablet Commonly known as: VITAMIN D3 Take 1,000 Units by mouth daily.   fish oil-omega-3 fatty acids 1000 MG capsule Take 1 g by mouth daily.   fluticasone  50 MCG/ACT nasal spray Commonly known as: FLONASE  Place 1 spray into both nostrils at bedtime as needed for allergies or rhinitis. What changed: See the new instructions. Changed by: Fonda LABOR Ski Polich   furosemide  20 MG tablet Commonly known as: LASIX  TAKE 1 TABLET BY MOUTH EVERY DAY AS NEEDED   hydrALAZINE  25 MG tablet Commonly known as: APRESOLINE  TAKE 25 MG MORNING, 25 MG AT LUNCH, AND 50 MG ( 2 TABLETS ) AT BEDTIME   metoprolol  succinate 100 MG 24 hr tablet Commonly known as: TOPROL -XL Take 1 tablet (100 mg total) by mouth daily. TAKE WITH OR IMMEDIATELY FOLLOWING A MEAL.   multivitamin tablet Take 1 tablet by mouth daily.   polyethylene glycol powder 17 GM/SCOOP powder Commonly known as: GLYCOLAX/MIRALAX Take 17 g by mouth daily.   telmisartan  80 MG tablet Commonly known as: MICARDIS  Take 1 tablet (80 mg total) by mouth every evening.   triamcinolone  cream 0.1 % Commonly known as: KENALOG  APPLY TO AFFECTED AREA TWICE A DAY What changed: See the new instructions.         Objective:   BP (!) 162/61   Pulse (!) 59   Ht 5' (1.524 m)   Wt 123 lb (55.8 kg)   SpO2 96%   BMI 24.02 kg/m   Wt Readings from Last 3 Encounters:  06/25/24 123 lb (55.8 kg)  03/19/24 128 lb (58.1 kg)  03/18/24 128 lb (58.1 kg)    Physical Exam Physical Exam   VITALS: BP- 162/61 CHEST: Lungs clear to auscultation bilaterally. CARDIOVASCULAR: Heart regular rate and rhythm. Systolic murmur present. EXTREMITIES: No edema in extremities, pulses intact.       Results for orders placed or performed in visit on 06/23/24  Bayer DCA Hb A1c Waived   Collection Time: 06/23/24  8:52 AM  Result Value Ref Range   HB A1C (BAYER DCA - WAIVED) 5.5 4.8 - 5.6 %  TSH   Collection Time: 06/23/24  8:53  AM  Result Value Ref Range   TSH 1.350 0.450 - 4.500 uIU/mL  Lipid panel   Collection Time: 06/23/24  8:53 AM  Result Value Ref Range   Cholesterol, Total 136 100 - 199 mg/dL   Triglycerides 849 (H) 0 - 149 mg/dL   HDL 48 >60 mg/dL   VLDL Cholesterol Cal 26 5 - 40 mg/dL   LDL Chol Calc (NIH) 62 0 - 99 mg/dL   Chol/HDL Ratio 2.8 0.0 - 4.4 ratio  CMP14+EGFR   Collection Time: 06/23/24  8:53 AM  Result Value Ref Range   Glucose 99 70 - 99 mg/dL   BUN 17 8 - 27 mg/dL   Creatinine, Ser 9.42 0.57 - 1.00 mg/dL   eGFR 87 >40 fO/fpw/8.26  BUN/Creatinine Ratio 30 (H) 12 - 28   Sodium 143 134 - 144 mmol/L   Potassium 3.9 3.5 - 5.2 mmol/L   Chloride 106 96 - 106 mmol/L   CO2 22 20 - 29 mmol/L   Calcium  9.3 8.7 - 10.3 mg/dL   Total Protein 7.0 6.0 - 8.5 g/dL   Albumin 4.6 3.7 - 4.7 g/dL   Globulin, Total 2.4 1.5 - 4.5 g/dL   Bilirubin Total 0.4 0.0 - 1.2 mg/dL   Alkaline Phosphatase 114 48 - 129 IU/L   AST 27 0 - 40 IU/L   ALT 19 0 - 32 IU/L  CBC with Differential/Platelet   Collection Time: 06/23/24  8:53 AM  Result Value Ref Range   WBC 4.9 3.4 - 10.8 x10E3/uL   RBC 4.10 3.77 - 5.28 x10E6/uL   Hemoglobin 13.1 11.1 - 15.9 g/dL   Hematocrit 59.9 65.9 - 46.6 %   MCV 98 (H) 79 - 97 fL   MCH 32.0 26.6 - 33.0 pg   MCHC 32.8 31.5 - 35.7 g/dL   RDW 86.9 88.2 - 84.5 %   Platelets 141 (L) 150 - 450 x10E3/uL   Neutrophils 60 Not Estab. %   Lymphs 26 Not Estab. %   Monocytes 11 Not Estab. %   Eos 2 Not Estab. %   Basos 1 Not Estab. %   Neutrophils Absolute 3.0 1.4 - 7.0 x10E3/uL   Lymphocytes Absolute 1.3 0.7 - 3.1 x10E3/uL   Monocytes Absolute 0.5 0.1 - 0.9 x10E3/uL   EOS (ABSOLUTE) 0.1 0.0 - 0.4 x10E3/uL   Basophils Absolute 0.0 0.0 - 0.2 x10E3/uL   Immature Granulocytes 0 Not Estab. %   Immature Grans (Abs) 0.0 0.0 - 0.1 x10E3/uL    Assessment & Plan:   Problem List Items Addressed This Visit       Cardiovascular and Mediastinum   Essential hypertension (Chronic)    Relevant Medications   amLODipine -atorvastatin  (CADUET ) 10-40 MG tablet   metoprolol  succinate (TOPROL -XL) 100 MG 24 hr tablet   Other Relevant Orders   Bayer DCA Hb A1c Waived   CMP14+EGFR     Other   Dyslipidemia (Chronic)   Relevant Medications   amLODipine -atorvastatin  (CADUET ) 10-40 MG tablet   Other Relevant Orders   CMP14+EGFR   Pre-diabetes - Primary   Relevant Orders   Bayer DCA Hb A1c Waived   CMP14+EGFR   Other Visit Diagnoses       Sinus congestion       Relevant Medications   fluticasone  (FLONASE ) 50 MCG/ACT nasal spray     Upper respiratory tract infection, unspecified type       Relevant Medications   fluticasone  (FLONASE ) 50 MCG/ACT nasal spray     Thyroid  nodule       Relevant Medications   metoprolol  succinate (TOPROL -XL) 100 MG 24 hr tablet   Other Relevant Orders   US  THYROID            Hypertension Blood pressure variable, elevated at night, lower during day. No adjustment to avoid hypotension and falls. - Continue current antihypertensive regimen. - Monitor blood pressure at home. - Adjust medication if blood pressure remains consistently elevated.  Hyperlipidemia LDL controlled at 62 mg/dL, triglycerides at 849 mg/dL. Fish oils effective. - Continue fish oil supplementation.  Congestion and ear/head pressure Persistent congestion and ear/head pressure. Not using Flonase . - Prescribed Flonase  nasal spray. - Advised use of Flonase  nightly for at least two weeks. - Consider adding Benadryl at night for additional relief.  Hypothyroidism Thyroid  levels well-controlled. - Continue current thyroid  management.  Migraine with visual symptoms (suspected) Visual symptoms suggestive of migraines without pain. Differential includes ocular issues. - Recommended eye examination to rule out ocular causes. - Will consider neurology referral if eye exam is normal.  General Health Maintenance Discussed importance of vaccinations due to higher risk  status. - Recommended annual flu shot. - Advised COVID booster at pharmacy.          Follow up plan: Return in about 4 months (around 10/23/2024), or if symptoms worsen or fail to improve, for Prediabetes and hypertension recheck.  Counseling provided for all of the vaccine components Orders Placed This Encounter  Procedures   US  THYROID    Bayer DCA Hb A1c Waived   CMP14+EGFR    Fonda Levins, MD [facility] 06/25/2024, 10:36 AM

## 2024-07-09 ENCOUNTER — Ambulatory Visit (HOSPITAL_COMMUNITY)

## 2024-07-22 ENCOUNTER — Other Ambulatory Visit: Payer: Self-pay | Admitting: Family Medicine

## 2024-07-22 DIAGNOSIS — E785 Hyperlipidemia, unspecified: Secondary | ICD-10-CM

## 2024-07-22 DIAGNOSIS — I10 Essential (primary) hypertension: Secondary | ICD-10-CM

## 2024-10-21 ENCOUNTER — Other Ambulatory Visit

## 2024-10-23 ENCOUNTER — Ambulatory Visit: Admitting: Family Medicine

## 2024-11-03 ENCOUNTER — Ambulatory Visit: Admitting: Physician Assistant

## 2024-11-03 ENCOUNTER — Other Ambulatory Visit (HOSPITAL_COMMUNITY)
